# Patient Record
Sex: Female | Born: 1985 | Hispanic: Yes | Marital: Married | Smoking: Never smoker
Health system: Southern US, Community
[De-identification: ages and names within clinical notes are randomized; demographics above are authoritative.]

## PROBLEM LIST (undated history)

## (undated) ENCOUNTER — Inpatient Hospital Stay (HOSPITAL_COMMUNITY): Payer: Self-pay

## (undated) ENCOUNTER — Inpatient Hospital Stay (HOSPITAL_COMMUNITY): Admission: RE | Payer: Self-pay | Source: Ambulatory Visit

## (undated) DIAGNOSIS — E119 Type 2 diabetes mellitus without complications: Secondary | ICD-10-CM

## (undated) DIAGNOSIS — F502 Bulimia nervosa, unspecified: Secondary | ICD-10-CM

## (undated) DIAGNOSIS — K219 Gastro-esophageal reflux disease without esophagitis: Secondary | ICD-10-CM

## (undated) DIAGNOSIS — L309 Dermatitis, unspecified: Secondary | ICD-10-CM

## (undated) DIAGNOSIS — N200 Calculus of kidney: Secondary | ICD-10-CM

## (undated) DIAGNOSIS — F509 Eating disorder, unspecified: Secondary | ICD-10-CM

## (undated) DIAGNOSIS — E0590 Thyrotoxicosis, unspecified without thyrotoxic crisis or storm: Secondary | ICD-10-CM

## (undated) HISTORY — PX: NO PAST SURGERIES: SHX2092

## (undated) HISTORY — DX: Eating disorder, unspecified: F50.9

---

## 2006-02-02 ENCOUNTER — Inpatient Hospital Stay (HOSPITAL_COMMUNITY): Admission: AD | Admit: 2006-02-02 | Discharge: 2006-02-02 | Payer: Self-pay | Admitting: Obstetrics and Gynecology

## 2006-06-23 ENCOUNTER — Inpatient Hospital Stay (HOSPITAL_COMMUNITY): Admission: AD | Admit: 2006-06-23 | Discharge: 2006-06-23 | Payer: Self-pay | Admitting: Obstetrics

## 2006-06-23 ENCOUNTER — Inpatient Hospital Stay (HOSPITAL_COMMUNITY): Admission: AD | Admit: 2006-06-23 | Discharge: 2006-06-26 | Payer: Self-pay | Admitting: Obstetrics

## 2008-08-20 ENCOUNTER — Emergency Department (HOSPITAL_COMMUNITY): Admission: EM | Admit: 2008-08-20 | Discharge: 2008-08-21 | Payer: Self-pay | Admitting: Emergency Medicine

## 2009-03-21 ENCOUNTER — Emergency Department (HOSPITAL_COMMUNITY): Admission: EM | Admit: 2009-03-21 | Discharge: 2009-03-21 | Payer: Self-pay | Admitting: Emergency Medicine

## 2009-05-18 ENCOUNTER — Inpatient Hospital Stay (HOSPITAL_COMMUNITY): Admission: AD | Admit: 2009-05-18 | Discharge: 2009-05-18 | Payer: Self-pay | Admitting: Obstetrics & Gynecology

## 2009-07-25 ENCOUNTER — Inpatient Hospital Stay (HOSPITAL_COMMUNITY): Admission: AD | Admit: 2009-07-25 | Discharge: 2009-07-25 | Payer: Self-pay | Admitting: Family Medicine

## 2009-07-25 ENCOUNTER — Encounter: Payer: Self-pay | Admitting: Family Medicine

## 2009-10-27 ENCOUNTER — Ambulatory Visit (HOSPITAL_COMMUNITY): Admission: RE | Admit: 2009-10-27 | Discharge: 2009-10-27 | Payer: Self-pay | Admitting: Obstetrics & Gynecology

## 2010-03-15 ENCOUNTER — Inpatient Hospital Stay (HOSPITAL_COMMUNITY): Admission: AD | Admit: 2010-03-15 | Discharge: 2010-03-17 | Payer: Self-pay | Admitting: Obstetrics & Gynecology

## 2010-03-15 ENCOUNTER — Ambulatory Visit: Payer: Self-pay | Admitting: Physician Assistant

## 2010-12-20 LAB — CBC
HCT: 36.2 % (ref 36.0–46.0)
HCT: 40.3 % (ref 36.0–46.0)
Hemoglobin: 12.4 g/dL (ref 12.0–15.0)
MCHC: 34.3 g/dL (ref 30.0–36.0)
MCV: 91.6 fL (ref 78.0–100.0)
MCV: 92.3 fL (ref 78.0–100.0)
Platelets: 322 10*3/uL (ref 150–400)
RBC: 3.92 MIL/uL (ref 3.87–5.11)
RDW: 14.6 % (ref 11.5–15.5)
WBC: 13.7 10*3/uL — ABNORMAL HIGH (ref 4.0–10.5)

## 2010-12-20 LAB — RPR: RPR Ser Ql: NONREACTIVE

## 2011-01-06 LAB — CBC
Hemoglobin: 13.4 g/dL (ref 12.0–15.0)
RBC: 4.3 MIL/uL (ref 3.87–5.11)
WBC: 9.1 10*3/uL (ref 4.0–10.5)

## 2011-01-06 LAB — HCG, QUANTITATIVE, PREGNANCY: hCG, Beta Chain, Quant, S: 5358 m[IU]/mL — ABNORMAL HIGH (ref <5)

## 2011-01-06 LAB — URINALYSIS, ROUTINE W REFLEX MICROSCOPIC
Protein, ur: NEGATIVE mg/dL
Urobilinogen, UA: 0.2 mg/dL (ref 0.0–1.0)

## 2011-01-06 LAB — WET PREP, GENITAL: Yeast Wet Prep HPF POC: NONE SEEN

## 2011-01-06 LAB — ABO/RH: ABO/RH(D): O POS

## 2011-01-08 LAB — GC/CHLAMYDIA PROBE AMP, GENITAL
Chlamydia, DNA Probe: NEGATIVE
GC Probe Amp, Genital: NEGATIVE

## 2011-01-08 LAB — URINALYSIS, ROUTINE W REFLEX MICROSCOPIC
Bilirubin Urine: NEGATIVE
Glucose, UA: NEGATIVE mg/dL
Hgb urine dipstick: NEGATIVE
Protein, ur: NEGATIVE mg/dL
Urobilinogen, UA: 0.2 mg/dL (ref 0.0–1.0)

## 2011-01-08 LAB — WET PREP, GENITAL: Yeast Wet Prep HPF POC: NONE SEEN

## 2011-01-10 LAB — URINALYSIS, ROUTINE W REFLEX MICROSCOPIC
Ketones, ur: NEGATIVE mg/dL
Nitrite: NEGATIVE
pH: 6.5 (ref 5.0–8.0)

## 2011-01-10 LAB — URINE MICROSCOPIC-ADD ON

## 2011-02-11 ENCOUNTER — Emergency Department (HOSPITAL_COMMUNITY)
Admission: EM | Admit: 2011-02-11 | Discharge: 2011-02-11 | Disposition: A | Discharge status: Home / Self Care | Payer: Self-pay | Attending: Emergency Medicine | Admitting: Emergency Medicine

## 2011-02-11 ENCOUNTER — Emergency Department (HOSPITAL_COMMUNITY): Payer: Self-pay

## 2011-02-11 DIAGNOSIS — M549 Dorsalgia, unspecified: Secondary | ICD-10-CM | POA: Insufficient documentation

## 2011-02-11 DIAGNOSIS — R070 Pain in throat: Secondary | ICD-10-CM | POA: Insufficient documentation

## 2011-02-11 DIAGNOSIS — R079 Chest pain, unspecified: Secondary | ICD-10-CM | POA: Insufficient documentation

## 2011-02-11 DIAGNOSIS — R109 Unspecified abdominal pain: Secondary | ICD-10-CM | POA: Insufficient documentation

## 2011-02-11 DIAGNOSIS — R1110 Vomiting, unspecified: Secondary | ICD-10-CM | POA: Insufficient documentation

## 2011-02-11 LAB — POCT I-STAT, CHEM 8
BUN: 4 mg/dL — ABNORMAL LOW (ref 6–23)
Glucose, Bld: 83 mg/dL (ref 70–99)
HCT: 41 % (ref 36.0–46.0)
Potassium: 3.4 mEq/L — ABNORMAL LOW (ref 3.5–5.1)
Sodium: 138 mEq/L (ref 135–145)

## 2011-02-18 NOTE — Op Note (Signed)
[name], Kendra NO.:  [id number]   MEDICAL RECORD NO.:  [id number]          PATIENT TYPE:  INP   LOCATION:  9147                          FACILITY:  [facility]   PHYSICIAN:  Kathreen Cosier, M.D.DATE OF BIRTH:  Feb 24, 1986   DATE OF PROCEDURE:  06/24/2006  DATE OF DISCHARGE:  06/26/2006                                 OPERATIVE REPORT   PROCEDURE:  Delivery note.   DESCRIPTION OF PROCEDURE:  The patient was fully dilated and pushed to +3  station.  Then had a midline episiotomy done and she pushed through 1  contraction and delivered from the LOA position of a female, Apgars 9 and 9.  She had a third degree extension which was repaired with 0 Vicryl.  The  episiotomy was repaired with 3-0 Monocryl.  __________.           ______________________________  Kathreen Cosier, M.D.     BAM/MEDQ  D:  06/24/2006  T:  06/27/2006  Job:  [id number]

## 2011-07-04 ENCOUNTER — Emergency Department (HOSPITAL_COMMUNITY)
Admission: EM | Admit: 2011-07-04 | Discharge: 2011-07-04 | Discharge status: Against Medical Advice | Payer: Self-pay | Attending: Emergency Medicine | Admitting: Emergency Medicine

## 2011-07-04 DIAGNOSIS — E876 Hypokalemia: Secondary | ICD-10-CM | POA: Insufficient documentation

## 2011-07-04 DIAGNOSIS — R1110 Vomiting, unspecified: Secondary | ICD-10-CM | POA: Insufficient documentation

## 2011-07-04 DIAGNOSIS — F5000 Anorexia nervosa, unspecified: Secondary | ICD-10-CM | POA: Insufficient documentation

## 2011-07-04 DIAGNOSIS — R632 Polyphagia: Secondary | ICD-10-CM | POA: Insufficient documentation

## 2011-07-04 LAB — BASIC METABOLIC PANEL
Chloride: 93 mEq/L — ABNORMAL LOW (ref 96–112)
GFR calc Af Amer: >90 mL/min (ref >90)
GFR calc non Af Amer: >90 mL/min (ref >90)
Potassium: 2.7 mEq/L — CL (ref 3.5–5.1)
Sodium: 136 mEq/L (ref 135–145)

## 2011-07-04 LAB — COMPREHENSIVE METABOLIC PANEL
ALT: 31 U/L (ref 0–35)
AST: 33 U/L (ref 0–37)
Albumin: 4.2 g/dL (ref 3.5–5.2)
CO2: 34 mEq/L — ABNORMAL HIGH (ref 19–32)
Calcium: 9.3 mg/dL (ref 8.4–10.5)
GFR calc non Af Amer: >90 mL/min (ref >90)
Sodium: 130 mEq/L — ABNORMAL LOW (ref 135–145)

## 2011-07-04 LAB — CBC
HCT: 38.5 % (ref 36.0–46.0)
MCH: 29.8 pg (ref 26.0–34.0)
MCV: 83.2 fL (ref 78.0–100.0)
Platelets: 326 10*3/uL (ref 150–400)

## 2011-07-04 LAB — POCT I-STAT, CHEM 8
HCT: 42 % (ref 36.0–46.0)
Hemoglobin: 14.3 g/dL (ref 12.0–15.0)
Potassium: 2.4 mEq/L — CL (ref 3.5–5.1)
Sodium: 133 mEq/L — ABNORMAL LOW (ref 135–145)

## 2011-07-04 LAB — DIFFERENTIAL
Basophils Absolute: 0 10*3/uL (ref 0.0–0.1)
Basophils Relative: 0 % (ref 0–1)
Lymphocytes Relative: 38 % (ref 12–46)
Monocytes Absolute: 0.4 10*3/uL (ref 0.1–1.0)
Neutro Abs: 3.9 10*3/uL (ref 1.7–7.7)

## 2011-07-04 LAB — URINALYSIS, ROUTINE W REFLEX MICROSCOPIC
Glucose, UA: NEGATIVE mg/dL
Leukocytes, UA: NEGATIVE
Specific Gravity, Urine: 1.005 (ref 1.005–1.030)
pH: 7 (ref 5.0–8.0)

## 2011-07-04 LAB — URINE MICROSCOPIC-ADD ON

## 2011-07-06 LAB — GC/CHLAMYDIA PROBE AMP, GENITAL
Chlamydia, DNA Probe: NEGATIVE
GC Probe Amp, Genital: NEGATIVE

## 2011-07-06 LAB — URINALYSIS, ROUTINE W REFLEX MICROSCOPIC
Nitrite: NEGATIVE
Specific Gravity, Urine: 1.023
Urobilinogen, UA: 1
pH: 6.5

## 2011-07-06 LAB — RPR: RPR Ser Ql: NONREACTIVE

## 2011-07-06 LAB — URINE MICROSCOPIC-ADD ON

## 2011-07-06 LAB — WET PREP, GENITAL: Clue Cells Wet Prep HPF POC: NONE SEEN

## 2011-07-26 ENCOUNTER — Emergency Department (HOSPITAL_COMMUNITY)
Admission: EM | Admit: 2011-07-26 | Discharge: 2011-07-27 | Disposition: A | Discharge status: Home / Self Care | Payer: Self-pay | Attending: Emergency Medicine | Admitting: Emergency Medicine

## 2011-07-26 DIAGNOSIS — R110 Nausea: Secondary | ICD-10-CM | POA: Insufficient documentation

## 2011-07-26 DIAGNOSIS — F5000 Anorexia nervosa, unspecified: Secondary | ICD-10-CM | POA: Insufficient documentation

## 2011-07-26 DIAGNOSIS — E876 Hypokalemia: Secondary | ICD-10-CM | POA: Insufficient documentation

## 2011-07-27 LAB — DIFFERENTIAL
Basophils Absolute: 0 10*3/uL (ref 0.0–0.1)
Eosinophils Relative: 1 % (ref 0–5)
Lymphs Abs: 4.3 10*3/uL — ABNORMAL HIGH (ref 0.7–4.0)
Monocytes Absolute: 0.5 10*3/uL (ref 0.1–1.0)
Neutrophils Relative %: 36 % — ABNORMAL LOW (ref 43–77)

## 2011-07-27 LAB — POCT I-STAT, CHEM 8
Calcium, Ion: 1.14 mmol/L (ref 1.12–1.32)
Calcium, Ion: 1.15 mmol/L (ref 1.12–1.32)
Chloride: 92 mEq/L — ABNORMAL LOW (ref 96–112)
Creatinine, Ser: 0.8 mg/dL (ref 0.50–1.10)
Glucose, Bld: 80 mg/dL (ref 70–99)
Glucose, Bld: 93 mg/dL (ref 70–99)
HCT: 47 % — ABNORMAL HIGH (ref 36.0–46.0)
Hemoglobin: 13.3 g/dL (ref 12.0–15.0)
Hemoglobin: 16 g/dL — ABNORMAL HIGH (ref 12.0–15.0)
Potassium: 2.8 mEq/L — ABNORMAL LOW (ref 3.5–5.1)
Potassium: 3.4 mEq/L — ABNORMAL LOW (ref 3.5–5.1)

## 2011-07-27 LAB — URINALYSIS, ROUTINE W REFLEX MICROSCOPIC
Nitrite: NEGATIVE
Protein, ur: NEGATIVE mg/dL
Specific Gravity, Urine: 1.003 — ABNORMAL LOW (ref 1.005–1.030)
Urobilinogen, UA: 0.2 mg/dL (ref 0.0–1.0)

## 2011-07-27 LAB — URINE MICROSCOPIC-ADD ON

## 2011-07-27 LAB — HEPATIC FUNCTION PANEL
AST: 23 U/L (ref 0–37)
Albumin: 5.2 g/dL (ref 3.5–5.2)
Total Protein: 8.5 g/dL — ABNORMAL HIGH (ref 6.0–8.3)

## 2011-07-27 LAB — MAGNESIUM: Magnesium: 2.3 mg/dL (ref 1.5–2.5)

## 2011-07-27 LAB — CBC
HCT: 41.2 % (ref 36.0–46.0)
MCHC: 37.6 g/dL — ABNORMAL HIGH (ref 30.0–36.0)
Platelets: 405 10*3/uL — ABNORMAL HIGH (ref 150–400)
RDW: 12.3 % (ref 11.5–15.5)
WBC: 7.6 10*3/uL (ref 4.0–10.5)

## 2011-07-27 LAB — POCT PREGNANCY, URINE: Preg Test, Ur: NEGATIVE

## 2011-07-27 LAB — LIPASE, BLOOD: Lipase: 65 U/L — ABNORMAL HIGH (ref 11–59)

## 2011-10-10 ENCOUNTER — Encounter: Payer: Self-pay | Admitting: Emergency Medicine

## 2011-10-10 ENCOUNTER — Emergency Department (HOSPITAL_COMMUNITY)
Admission: EM | Admit: 2011-10-10 | Discharge: 2011-10-10 | Disposition: A | Discharge status: Home / Self Care | Payer: Self-pay | Attending: Emergency Medicine | Admitting: Emergency Medicine

## 2011-10-10 DIAGNOSIS — J029 Acute pharyngitis, unspecified: Secondary | ICD-10-CM

## 2011-10-10 DIAGNOSIS — R110 Nausea: Secondary | ICD-10-CM | POA: Insufficient documentation

## 2011-10-10 LAB — RAPID STREP SCREEN (MED CTR MEBANE ONLY): Streptococcus, Group A Screen (Direct): NEGATIVE

## 2011-10-10 MED ORDER — PHENOL 1.4 % MT LIQD: 2.0000 | OROMUCOSAL | Status: DC | PRN

## 2011-10-10 NOTE — ED Notes (Signed)
sore throat x 3 days

## 2011-10-10 NOTE — ED Provider Notes (Signed)
History     CSN: [id number]  Arrival date & time 10/10/11  1112   First MD Initiated Contact with Patient 10/10/11 1238      Chief Complaint  Patient presents with  . Sore Throat    (Consider location/radiation/quality/duration/timing/severity/associated sxs/prior treatment) HPI Comments: Slight nausea, no cough, no abd pain.  No ear pain.  No fevers or chills.  Patient is a 26 y.o. female presenting with pharyngitis. The history is provided by the patient. The history is limited by a language barrier.  Sore Throat The current episode started more than 2 days ago. The problem occurs constantly. The problem has not changed since onset.Pertinent negatives include no chest pain, no abdominal pain and no shortness of breath.    History reviewed. No pertinent past medical history.  History reviewed. No pertinent past surgical history.  History reviewed. No pertinent family history.  History  Substance Use Topics  . Smoking status: Not on file  . Smokeless tobacco: Not on file  . Alcohol Use: Not on file    OB History    Grav Para Term Preterm Abortions TAB SAB Ect Mult Living                  Review of Systems  Constitutional: Negative for fever.  HENT: Positive for sore throat. Negative for sinus pressure.   Respiratory: Negative for cough and shortness of breath.   Cardiovascular: Negative for chest pain.  Gastrointestinal: Positive for nausea. Negative for vomiting and abdominal pain.    Allergies  Review of patient's allergies indicates no known allergies.  Home Medications   Current Outpatient Rx  Name Route Sig Dispense Refill  . PHENOL 1.4 % MT LIQD Mouth/Throat Use as directed 2 sprays in the mouth or throat as needed. 1 Bottle 0    BP 90/58  Pulse 74  Temp 97.5 F (36.4 C)  Resp 18  SpO2 97%  Physical Exam  Nursing note and vitals reviewed. Constitutional: She appears well-developed and well-nourished. No distress.  HENT:  Head: Normocephalic  and atraumatic.  Mouth/Throat: Uvula is midline, oropharynx is clear and moist and mucous membranes are normal. No oral lesions. No uvula swelling. No oropharyngeal exudate or tonsillar abscesses.  Pulmonary/Chest: Effort normal and breath sounds normal.  Abdominal: Soft. There is no rebound and no guarding.  Lymphadenopathy:    She has no cervical adenopathy.    ED Course  Procedures (including critical care time)   Labs Reviewed  RAPID STREP SCREEN  STREP A DNA PROBE   No results found.   1. Pharyngitis       MDM  Pt has no fever, RA sat of 97% is normal.  No sig lymphadenopathy.  Lungs clear, abd is soft, non tender.  Will check a strep screen.  I suspect just viral pharyngitis.          Gavin Pound. Jeanelle Dake, MD 10/10/11 1416

## 2011-10-10 NOTE — ED Notes (Signed)
Pt c/o sorethroat with burning x several days; pt speaks little english

## 2011-10-10 NOTE — Discharge Instructions (Signed)
Dolor de garganta  (Sore Throat)  El dolor de garganta puede ser causado tanto por virus como por bacterias. Otras causas posibles son:   El consumo de cigarrillos.    La polucin    Alergias.   Si el dolor de garganta se debe a una infeccin por estreptococo (infeccin bacteriana), podr necesitar:   Lauris Poag farngea.    Un cultivo para verificar la infeccin por estreptococo.   Necesitar:   Una inyeccin de antibiticos.    Medicamentos orales por [employer].   Las infecciones por estreptococos son Nicholaus Corolla. Un mdico deber controlar a las personas ms cercanas a usted que Teaching laboratory technician de garganta o [place]. El dolor de garganta causado por una infeccin viral generalmente dura entre 3 a 4 das. Los antibiticos no son efectivos para tratar dolores de garganta virales.   Sin embargo, la mononucleosis (o una enfermedad viral) puede causar dolor de garganta que dura hasta 3 semanas. La mononucleosis puede diagnosticarse mediante exmenes de [place]. Tiene que haber estado enfermo durante al menos 1 semana para [employer] del examen puedan dar un resultado seguro.  INSTRUCCIONES PARA EL CUIDADO DOMICILIARIO   Para tratar el dolor de garganta puede tomar un analgsico suave com.    Aumente su ingesta de lquidos.    Realice una dieta blanda.    No fume.    Hgase grgaras con agua tibia o agua con sal (una cucharada de sal en 25 cc de agua).    Pruebe con [employer] y pastillas para la tos, o chupe un caramelo duro para ayudar a Merchant navy officer.   Comunquese con su mdico si su dolor de garganta dura ms de una semana.   SOLICITE ATENCIN MDICA DE INMEDIATO SI:    Tiene dificultad para respirar.    Tiene un aumento de la inflamacin en la garganta.    Tiene dolor muy intenso que impide tragar lquidos o la propia saliva.    Tiene dolor de cabeza intenso, fiebre alta, vmitos o una erupcin.   Document Released: 09/19/2005 Document Revised: 06/01/2011  [facility] Patient  Information 2012 [place], [place].

## 2011-10-11 LAB — STREP A DNA PROBE: Group A Strep Probe: NEGATIVE

## 2011-11-02 ENCOUNTER — Emergency Department (HOSPITAL_COMMUNITY): Payer: Self-pay

## 2011-11-02 ENCOUNTER — Encounter (HOSPITAL_COMMUNITY): Payer: Self-pay | Admitting: Emergency Medicine

## 2011-11-02 ENCOUNTER — Emergency Department (HOSPITAL_COMMUNITY)
Admission: EM | Admit: 2011-11-02 | Discharge: 2011-11-02 | Disposition: A | Discharge status: Home / Self Care | Payer: Self-pay | Attending: Emergency Medicine | Admitting: Emergency Medicine

## 2011-11-02 ENCOUNTER — Other Ambulatory Visit: Payer: Self-pay

## 2011-11-02 DIAGNOSIS — M549 Dorsalgia, unspecified: Secondary | ICD-10-CM | POA: Insufficient documentation

## 2011-11-02 DIAGNOSIS — R072 Precordial pain: Secondary | ICD-10-CM | POA: Insufficient documentation

## 2011-11-02 DIAGNOSIS — E876 Hypokalemia: Secondary | ICD-10-CM | POA: Insufficient documentation

## 2011-11-02 DIAGNOSIS — F502 Bulimia nervosa, unspecified: Secondary | ICD-10-CM | POA: Insufficient documentation

## 2011-11-02 DIAGNOSIS — E860 Dehydration: Secondary | ICD-10-CM | POA: Insufficient documentation

## 2011-11-02 DIAGNOSIS — R42 Dizziness and giddiness: Secondary | ICD-10-CM | POA: Insufficient documentation

## 2011-11-02 DIAGNOSIS — R0602 Shortness of breath: Secondary | ICD-10-CM | POA: Insufficient documentation

## 2011-11-02 LAB — POCT I-STAT, CHEM 8
Creatinine, Ser: 0.9 mg/dL (ref 0.50–1.10)
Hemoglobin: 13.6 g/dL (ref 12.0–15.0)
Sodium: 133 mEq/L — ABNORMAL LOW (ref 135–145)
TCO2: 40 mmol/L (ref 0–100)

## 2011-11-02 LAB — POCT I-STAT TROPONIN I: Troponin i, poc: 0 ng/mL (ref 0.00–0.08)

## 2011-11-02 MED ORDER — POTASSIUM CHLORIDE 10 MEQ/100ML IV SOLN
10.0000 meq | Freq: Once | INTRAVENOUS | Status: AC
  Administered 2011-11-02: 10 meq via INTRAVENOUS
  Filled 2011-11-02: qty 100

## 2011-11-02 MED ORDER — POTASSIUM CHLORIDE CRYS ER 20 MEQ PO TBCR
20.0000 meq | EXTENDED_RELEASE_TABLET | Freq: Once | ORAL | Status: AC
  Administered 2011-11-02: 20 meq via ORAL
  Filled 2011-11-02: qty 1

## 2011-11-02 MED ORDER — GI COCKTAIL ~~LOC~~
30.0000 mL | Freq: Once | ORAL | Status: AC
  Administered 2011-11-02: 30 mL via ORAL
  Filled 2011-11-02: qty 30

## 2011-11-02 MED ORDER — OMEPRAZOLE 20 MG PO CPDR: 20.0000 mg | DELAYED_RELEASE_CAPSULE | Freq: Every day | ORAL | Status: DC

## 2011-11-02 MED ORDER — PANTOPRAZOLE SODIUM 40 MG IV SOLR
40.0000 mg | Freq: Once | INTRAVENOUS | Status: AC
  Administered 2011-11-02: 40 mg via INTRAVENOUS
  Filled 2011-11-02: qty 40

## 2011-11-02 MED ORDER — POTASSIUM CHLORIDE ER 10 MEQ PO TBCR: 10.0000 meq | EXTENDED_RELEASE_TABLET | Freq: Two times a day (BID) | ORAL | Status: DC

## 2011-11-02 MED ORDER — SODIUM CHLORIDE 0.9 % IV BOLUS (SEPSIS)
1000.0000 mL | Freq: Once | INTRAVENOUS | Status: AC
  Administered 2011-11-02: 1000 mL via INTRAVENOUS

## 2011-11-02 NOTE — ED Notes (Signed)
Pt reports on arrival sx of: sob,cp,dizzy,abd pain &vomiting.(Denies: diarrhea, fever, cough  or sore throat).

## 2011-11-02 NOTE — ED Notes (Signed)
Chem 8 results given to Dr. Patria Mane by B. Bing Plume, EMT

## 2011-11-02 NOTE — ED Notes (Signed)
Pt told via interpreter importance of PCP and following up with dietian. Pt verbalized understanding

## 2011-11-02 NOTE — ED Provider Notes (Signed)
History     CSN: [id number]  Arrival date & time 11/02/11  0865   First MD Initiated Contact with Patient 11/02/11 [phone]      Chief Complaint  Patient presents with  . Shortness of Breath  . Chest Pain  . Dizziness  . Emesis    The history is provided by the patient. A language interpreter was used Garment/textile technologist phone).   patient reports a history of approximately one year of bulimia.  She reports she vomits after most meals.  She reports several days of worsening chest pain and lightheadedness.  She reports the chest pain radiates through to her back.  She denies history of EtOH abuse.  She also reports feeling "dizzy".  She denies hematemesis.  She denies melena or hematochezia.  She denies loose stools.  She denies abdominal pain nausea vomiting or diarrhea.  When asked about her chest pain she points towards her lower sternum and epigastric region.  Nothing worsens her symptoms.  Nothing improves her symptoms.  Symptoms are constant.  She's had no fever or chills.  She has however with her physician about this.  She has not seen anyone in several months regarding her bulimia.  History reviewed. No pertinent past medical history.  History reviewed. No pertinent past surgical history.  No family history on file.  History  Substance Use Topics  . Smoking status: Never Smoker   . Smokeless tobacco: Not on file  . Alcohol Use: No    OB History    Grav Para Term Preterm Abortions TAB SAB Ect Mult Living                  Review of Systems  Respiratory: Positive for shortness of breath.   Cardiovascular: Positive for chest pain.  Gastrointestinal: Positive for vomiting.  All other systems reviewed and are negative.    Allergies  Review of patient's allergies indicates no known allergies.  Home Medications  No current outpatient prescriptions on file.  BP 94/59  Pulse 72  Temp(Src) 98.5 F (36.9 C) (Oral)  Resp 20  SpO2 98%  Physical Exam  Nursing note and vitals  reviewed. Constitutional: She is oriented to person, place, and time. She appears well-developed and well-nourished. No distress.  HENT:  Head: Normocephalic and atraumatic.  Eyes: EOM are normal.  Neck: Normal range of motion.  Cardiovascular: Normal rate, regular rhythm and normal heart sounds.   Pulmonary/Chest: Effort normal and breath sounds normal.  Abdominal: Soft. She exhibits no distension. There is no tenderness.  Musculoskeletal: Normal range of motion.  Neurological: She is alert and oriented to person, place, and time.  Skin: Skin is warm and dry.  Psychiatric: She has a normal mood and affect. Judgment normal.    ED Course  Procedures (including critical care time)   Date: 11/02/2011  Rate: 84  Rhythm: normal sinus rhythm  QRS Axis: normal  Intervals: normal  ST/T Wave abnormalities: Nonspecific ST and T wave changes.    Conduction Disutrbances:none  Narrative Interpretation:   Old EKG Reviewed: No old EKG for comparison     Labs Reviewed  POCT I-STAT, CHEM 8 - Abnormal; Notable for the following:    Sodium 133 (*)    Potassium 2.3 (*)    Chloride 84 (*)    Glucose, Bld 122 (*)    Calcium, Ion 1.09 (*)    All other components within normal limits  POCT I-STAT TROPONIN I   Dg Chest 2 View  11/02/2011  *RADIOLOGY  REPORT*  Clinical Data: Chest pain, back pain, shortness of breath.  CHEST - 2 VIEW  Comparison: None.  Findings: Mild hyperinflation.  Normal heart size and pulmonary vascularity.  Lungs appear clear expanded.  No focal airspace consolidation.  No blunting of costophrenic angles.  No pneumothorax.  Vague nodular opacity in the left midlung probably represents prominent nipple shadow.  IMPRESSION: No evidence of active pulmonary disease.  Original Report Authenticated By: Marlon Pel, M.D.     No diagnosis found.    MDM  The patient is very thin.  She appears to have dehydration hypokalemia associated with her bulimia.  I suspect her  discomfort is gastritis versus esophagitis.  Her potassium is low in the ER.  She will be repleted potassium.  IV Protonix and a GI cocktail be given in attempt to make the patient feel better  7:52 AM The patient feels much better at this time.  She's been given her potassium.   She will  be discharged home with potassium.  She's been instructed to followup with her primary care doctor and receive assistance from a dietitian.       Lyanne Co, MD 11/02/11 [phone]

## 2011-11-02 NOTE — ED Notes (Signed)
Pt. In room sitting quietly, c/o generalized body aches , states pain in left flank radiating down left leg, vomiting x 3 days.

## 2011-11-02 NOTE — ED Notes (Signed)
Report given and care relinquished to Sarah, RN.

## 2011-11-02 NOTE — ED Notes (Signed)
PT. REPORTS MID STERNAL CHEST PAIN WITH SOB AND VOMITTING ONSET 3 DAYS AGO .

## 2012-03-19 ENCOUNTER — Ambulatory Visit (INDEPENDENT_AMBULATORY_CARE_PROVIDER_SITE_OTHER): Payer: Self-pay | Admitting: Family Medicine

## 2012-03-19 ENCOUNTER — Encounter: Payer: Self-pay | Admitting: Family Medicine

## 2012-03-19 VITALS — BP 80/62 | HR 80 | Temp 98.7°F | Ht 63.0 in | Wt 91.4 lb

## 2012-03-19 DIAGNOSIS — R636 Underweight: Secondary | ICD-10-CM

## 2012-03-19 DIAGNOSIS — N911 Secondary amenorrhea: Secondary | ICD-10-CM

## 2012-03-19 DIAGNOSIS — N912 Amenorrhea, unspecified: Secondary | ICD-10-CM

## 2012-03-19 DIAGNOSIS — F509 Eating disorder, unspecified: Secondary | ICD-10-CM

## 2012-03-19 NOTE — Patient Instructions (Addendum)
Nice to meet you. I am sorry you are affected by bulemia. Your weight and BMI are very low. We should check blood work after you get the orange card. You should try to find a therapist or return to the one you saw. You can try at [facility] of the [place]. Make an appointment in 1-2 weeks for follow up.  Trastornos De La Alimentacin (Eating Disorders) Los trastornos alimentarios son problemas mdicos y psicolgicos. Generalmente tienen causas psicolgicas o emocionales. Son Henry Schein en personas con desrdenes alimentarios la depresin, la obsesin con la comida y la distorsin de Production assistant, radio. Con el tiempo, los desrdenes alimentarios pueden daar su cuerpo. Los desrdenes alimentarios ms frecuentes son:  Bulimia.  Reece Agar cuando una persona come gran cantidad de alimentos y no puede detenerse (atracn). Generalmente es seguido por vmitos, actividad fsica excesiva o uso de laxantes (purga). Se realizan con el objetivo de deshacerse de las caloras consumidas. La bulimia puede comenzar como un modo de Art gallery manager. Ms tarde la comilona y la purga pueden estar causadas por estrs o crisis emocional.   Anorexia nerviosa. Esto ocurre cuando una persona tiene un peso corporal extremadamente bajo por haber seguido una dieta muy estricta, actividad fsica compulsiva, o ambas. El perder peso o el evitar la ganancia de peso se vuelve una obsesin. Generalmente es un modo de evadir problemas emocionales.   Trastornos de Psychologist, sport and exercise no especificados (TANE). Este diagnstico se [employer] que tienen algunos de los sntomas de bulimia nerviosa o anorexia nerviosa. Sin embargo, no tienen todos los criterios para diagnosticar un trastorno especfico.  La bulimia y la anorexia pueden conducir a problemas graves que son:  Desnutricin extrema.   Desequilibrio hormonal.   Dficit de vitaminas y minerales.   Dao [employer].  DIAGNSTICO Metallurgist un examen fsico completo. Podr ser Lois Huxley evaluacin psicolgica. La evaluacin psicolgica incluir preguntas sobre los hbitos de alimentacin y la autoimagen. Podrn realizarle anlisis de sangre y [place]. TRATAMIENTO El tratamiento incluye:  Orientacin.   Nutricin adecuados.   Ejercicio adecuados.   A veces medicacin.   En los casos extremos ser necesaria la hospitalizacin.  INSTRUCCIONES PARA EL CUIDADO DOMICILIARIO  Conozca su trastorno alimenticio.   Identifique las situaciones que lo llevan a la sobrealimentacin o a Psychologist, forensic.   Desarrolle un plan para cuando quiera sobrealimentarse o hacer dieta.   Sepa quin puede apoyarlo cuando necesite hablar.   Resstase a pesarse o mirarse al espejo con frecuencia.   Siga las indicaciones del profesional sobre la alimentacin y el planeamiento de ejercicios.   Es muy importante que cumpla con las visitas para Animator.   Hgase controles dentales cada 6 meses.  SOLICITE ATENCIN MDICA SI:  Gana o pierde peso rpidamente.   Se provoca vmitos despus de comer.   Abusa de estimulantes o dietas.   Tiene hbitos compulsivos de hacer ejercicio.   Tiene pulso irregular.   Tiene miedo constante a Transport planner.   Se purga con laxantes despus de comer.   Tiene hbitos compulsivos para comer o Merchandiser, retail.   Tiene perodos menstruales irregulares.   No tiene el perodo menstrual.  SOLICITE ATENCIN MDICA DE INMEDIATO SI:  Vomita sangre o algo similar a la borra del caf.   Elimina heces con sangre brillante o de color rojo alquitranado.   Siente dolor en el pecho o presin.   Presenta dificultades respiratorias.   No orina por 8 horas.  PARA  OBTENER MS INFORMACIN  Alliance for Eating Disorders Awareness (Alianza para la conciencia contra los trastornos alimentarios) : www.allianceforeatingdisorders.com  National Association of Anorexia Nervosa and Associated Disorders (Asociacin  nacional contra la anorexia nerviosa y trastornos asociados) : www.anad.org  National Eating Disorders Association (Asociacin nacional contra los trastornos alimentarios): www.nationaleatingdisorders.org  Eating Disorders Anonymous (Trasrornos alimentarios annimos): www.eatingdisordersanonymous.org  Eating Disorders Resource Center [facility] de recursos para los trastornos alimentarios): AmericasFunny.ch  Document Released: 09/19/2005 Document Revised: 09/08/2011 [facility] Patient Information 2012 [place], [place].

## 2012-03-20 ENCOUNTER — Encounter: Payer: Self-pay | Admitting: Family Medicine

## 2012-03-20 DIAGNOSIS — R636 Underweight: Secondary | ICD-10-CM | POA: Insufficient documentation

## 2012-03-20 DIAGNOSIS — F502 Bulimia nervosa: Secondary | ICD-10-CM | POA: Insufficient documentation

## 2012-03-20 DIAGNOSIS — N911 Secondary amenorrhea: Secondary | ICD-10-CM | POA: Insufficient documentation

## 2012-03-20 NOTE — Assessment & Plan Note (Signed)
Body mass index is 16.19 kg/(m^2). 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  Discussed with patient principles of treatment including therapy. Would like to check baseline labs, electrolytes as patient has been hypokalemic before. Patient prefers to get orange card prior to studies. Plan to refer to Dr. Gerilyn Pilgrim nutrition and encouraged patient to f/u with the therapist she already saw recently. If not, will discuss other options for uninsured-perhaps Family Services or [facility]. Follow up in 1 week.

## 2012-03-20 NOTE — Assessment & Plan Note (Signed)
Likely secondary to disordered eating and low BMI which patient quickly endorses. Do not suspect underlying endocrine pathology since she had normal fertility and cycles prior to the eating disorder.

## 2012-03-20 NOTE — Progress Notes (Signed)
  Subjective:    Patient ID: [name], female    DOB: 07/25/1986, 26 y.o.   MRN: [id number]  HPI New patient to establish care. Spanish interpretor present. Plans to obtain orange card.   1. Amenorrhea. For past 2 years after birth of her son. No longer breastfeeding. Denies abdominal pain, rash, headaches.  2. Bulemia. She had her second child 2 years ago, and states she became bulimic at that time. She forces herself to throw up nearly daily. States she saw a therapist few weeks ago, found it slightly helpful. Unfortunately she doesn't remember his name and has no plans to return. She is concerned about being told she has low potassium at an ER visit several months ago. Endorses fatigue. Denies syncope, chest pain. States she plans to eat later today and not throw up.  Patient is married and has a 27 yo and a 69 yo child. 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   Past Medical History  Diagnosis Date  . Eating disorder    History reviewed. No pertinent past surgical history. History   Social History  . Marital Status: Married    Spouse Name: N/A    Number of Children: N/A  . Years of Education: N/A   Occupational History  . Not on file.   Social History Main Topics  . Smoking status: Never Smoker   . Smokeless tobacco: Not on file  . Alcohol Use: No  . Drug Use: No  . Sexually Active: Yes   Other Topics Concern  . Not on file   Social History Narrative   Lives in [place] since 2005 with husband and 2 children (2011, 2008). [place]. Spanish speaking.    Family History  Problem Relation Age of Onset  . Heart disease Mother     Review of Systems See HPI otherwise negative.     Objective:   Physical Exam  Vitals reviewed. Constitutional: She is oriented to person, place, and time. No distress.       Very thin. Hypertrophied salivary glands/parotid glands.   HENT:  Head: Atraumatic.  Mouth/Throat: Oropharynx is clear and moist.  Eyes: EOM are  normal. Pupils are equal, round, and reactive to light.  Neck: Neck supple.  Cardiovascular: Normal rate and regular rhythm.  Exam reveals no gallop.   No murmur heard. Pulmonary/Chest: Effort normal and breath sounds normal.  Abdominal: Soft. She exhibits no distension. There is no tenderness.  Musculoskeletal: She exhibits no edema and no tenderness.  Lymphadenopathy:    She has no cervical adenopathy.  Neurological: She is alert and oriented to person, place, and time. No cranial nerve deficit. She exhibits normal muscle tone. Coordination normal.  Skin: No rash noted. She is not diaphoretic.  Psychiatric:       Slight flat affect. Normal speech. Well-groomed.       Assessment & Plan:

## 2012-04-04 ENCOUNTER — Ambulatory Visit: Payer: Self-pay | Admitting: Family Medicine

## 2012-04-16 ENCOUNTER — Ambulatory Visit (HOSPITAL_COMMUNITY)
Admission: RE | Admit: 2012-04-16 | Discharge: 2012-04-16 | Disposition: A | Discharge status: Home / Self Care | Payer: Self-pay | Source: Ambulatory Visit | Attending: Family Medicine | Admitting: Family Medicine

## 2012-04-16 ENCOUNTER — Ambulatory Visit (INDEPENDENT_AMBULATORY_CARE_PROVIDER_SITE_OTHER): Payer: Self-pay | Admitting: Family Medicine

## 2012-04-16 VITALS — BP 87/59 | HR 84 | Ht 63.0 in | Wt 87.0 lb

## 2012-04-16 DIAGNOSIS — R636 Underweight: Secondary | ICD-10-CM

## 2012-04-16 DIAGNOSIS — F509 Eating disorder, unspecified: Secondary | ICD-10-CM

## 2012-04-16 DIAGNOSIS — I498 Other specified cardiac arrhythmias: Secondary | ICD-10-CM | POA: Insufficient documentation

## 2012-04-16 DIAGNOSIS — F329 Major depressive disorder, single episode, unspecified: Secondary | ICD-10-CM

## 2012-04-16 DIAGNOSIS — F32A Depression, unspecified: Secondary | ICD-10-CM

## 2012-04-16 DIAGNOSIS — F3289 Other specified depressive episodes: Secondary | ICD-10-CM

## 2012-04-16 DIAGNOSIS — R632 Polyphagia: Secondary | ICD-10-CM | POA: Insufficient documentation

## 2012-04-16 LAB — POCT URINALYSIS DIPSTICK
Blood, UA: NEGATIVE
Ketones, UA: NEGATIVE
Leukocytes, UA: NEGATIVE
Spec Grav, UA: 1.015
pH, UA: 8

## 2012-04-16 LAB — CBC
HCT: 38.9 % (ref 36.0–46.0)
Hemoglobin: 13.7 g/dL (ref 12.0–15.0)
MCH: 30.8 pg (ref 26.0–34.0)
MCHC: 35.2 g/dL (ref 30.0–36.0)
RDW: 13.6 % (ref 11.5–15.5)

## 2012-04-16 MED ORDER — CITALOPRAM HYDROBROMIDE 10 MG PO TABS: 10.0000 mg | ORAL_TABLET | Freq: Every day | ORAL | Status: DC

## 2012-04-16 MED ORDER — POTASSIUM CHLORIDE ER 10 MEQ PO TBCR: 20.0000 meq | EXTENDED_RELEASE_TABLET | Freq: Two times a day (BID) | ORAL | Status: DC

## 2012-04-16 NOTE — Assessment & Plan Note (Addendum)
Discuss treatment of bulimia with therapy, medication. Patient to followup at family services, will make nutrition and social work referral today, start low dose Celexa 10 mg. Check potassium, electrolytes, TSH, CBC today. EKG with flat T waves most likely related to hypokalemia. Will start low dose replacement daily pending results. Discussed eating small snacks and avoiding purging. Followup in one to 2 weeks. Advised to call if any problems with medication or change in status.  Case discussed with Dr. Mauricio Po, who also evaluated patient.

## 2012-04-16 NOTE — Assessment & Plan Note (Signed)
Likely playing a part with eating disorder. Start low-dose Celexa. Followup in one to 2 weeks. Discussed medication side effects and discontinuation for worsening symptoms

## 2012-04-16 NOTE — Progress Notes (Signed)
  Subjective:    Patient ID: [name], female    DOB: 08/16/1986, 26 y.o.   MRN: [id number]  HPI  Interpretor present during interview.  1. bulimia. Patient continues to struggle with throwing up and anorexic symptoms. Has lost 3-4 pounds since last evaluation. She has seen a therapist the of family services at the [place] in the past 2 weeks. Plans to followup. The patient states she feels somewhat dizzy and has palpitations at times due to lack of eating. She requested potassium level to be checked today. She is here with HER-2 children, is currently able to care for them appropriately she states. Her husband works during the daytime and she does not wish for her her issues to be discussed with him. And she is agreeable to start treatment today, and denies any inability to care for herself, her children. States she feels safe at home. Denies any suicidal ideation, severe anxiety, chest pain, dyspnea, leg swelling.  Review of Systems See HPI otherwise negative.  reports that she has never smoked. She does not have any smokeless tobacco history on file.     Objective:   Physical Exam  Vitals reviewed. Constitutional: She is oriented to person, place, and time. No distress.       Appears fatigued, cachectic.  HENT:  Head: Normocephalic and atraumatic.  Mouth/Throat: Oropharynx is clear and moist. No oropharyngeal exudate.  Eyes: EOM are normal. Pupils are equal, round, and reactive to light.  Neck: Neck supple.  Cardiovascular: Normal rate, regular rhythm and normal heart sounds.   No murmur heard. Pulmonary/Chest: Effort normal and breath sounds normal. No respiratory distress. She has no wheezes. She has no rales.  Abdominal: Soft. Bowel sounds are normal.  Musculoskeletal: She exhibits no edema and no tenderness.  Neurological: She is alert and oriented to person, place, and time.  Skin: No rash noted. She is not diaphoretic.  Psychiatric:       Flat affect. Speech and dress are  normal. Good eye contact. Interacts appropriately with her children.    EKG: normal sinus rhythm, nonspecific ST and T waves changes, sinus bradycardia, flattening and biphasic T waves noted.  PHQ-9 score is 7, not difficult       Assessment & Plan:

## 2012-04-16 NOTE — Assessment & Plan Note (Signed)
Discuss treatment of bulimia with therapy, medication. Patient to followup at family services, will make nutrition referral today, start low dose Celexa 10 mg. Check potassium, electrolytes, TSH, CBC today. Discussed eating small snacks and avoiding purging. Followup in one to 2 weeks. Advised to call if any problems with medication or change in status.

## 2012-04-16 NOTE — Patient Instructions (Addendum)
Your weight is lower today. You should start trying to eat small snacks today. Avoid throwing up. Start taking potassium-sent to [facility]. Start the anti-depressant medication once daily. Make an appointment in 1-2 weeks for follow up. I will call about lab tests if abnormal. You may be called by our social worker [place].

## 2012-04-17 LAB — COMPREHENSIVE METABOLIC PANEL
BUN: 26 mg/dL — ABNORMAL HIGH (ref 6–23)
CO2: 33 mEq/L — ABNORMAL HIGH (ref 19–32)
Calcium: 10.5 mg/dL (ref 8.4–10.5)
Chloride: 93 mEq/L — ABNORMAL LOW (ref 96–112)
Creat: 0.97 mg/dL (ref 0.50–1.10)
Glucose, Bld: 77 mg/dL (ref 70–99)
Total Bilirubin: 0.6 mg/dL (ref 0.3–1.2)

## 2012-04-27 ENCOUNTER — Telehealth: Payer: Self-pay | Admitting: Clinical

## 2012-04-27 NOTE — Telephone Encounter (Signed)
Message copied by Theresia Bough on Fri Apr 27, 2012 10:09 AM ------      Message from: Durwin Reges      Created: Mon Apr 16, 2012 10:26 AM      Regarding: help with resources.       This patient came in for follow up. She is spanish speaking only, has severe bulimia. Is working on getting the orange card. I gave her info for [facility] of [place], but she has had difficulty with navigating the process. Can you help me with getting her set with a therapist? I have also referred her to nutrition with Dr. Gerilyn Pilgrim, am unsure of any local resources that would be more helpful.

## 2012-04-27 NOTE — Telephone Encounter (Signed)
Clinical Child psychotherapist (CSW) contacted pt home phone however unable to leave a message. CSW contacted cell phone however it was pt brother in law who informed CSW to call the home number. CSW unable to reach pt.  Theresia Bough, MSW, Theresia Majors [phone]

## 2012-04-30 ENCOUNTER — Ambulatory Visit: Payer: Self-pay | Admitting: Family Medicine

## 2012-05-11 ENCOUNTER — Emergency Department (HOSPITAL_COMMUNITY)
Admission: EM | Admit: 2012-05-11 | Discharge: 2012-05-11 | Disposition: A | Discharge status: Home / Self Care | Payer: Self-pay | Attending: Emergency Medicine | Admitting: Emergency Medicine

## 2012-05-11 ENCOUNTER — Encounter (HOSPITAL_COMMUNITY): Payer: Self-pay | Admitting: Emergency Medicine

## 2012-05-11 ENCOUNTER — Emergency Department (HOSPITAL_COMMUNITY): Payer: Self-pay

## 2012-05-11 ENCOUNTER — Other Ambulatory Visit: Payer: Self-pay

## 2012-05-11 DIAGNOSIS — R109 Unspecified abdominal pain: Secondary | ICD-10-CM | POA: Insufficient documentation

## 2012-05-11 DIAGNOSIS — R079 Chest pain, unspecified: Secondary | ICD-10-CM | POA: Insufficient documentation

## 2012-05-11 DIAGNOSIS — Z8659 Personal history of other mental and behavioral disorders: Secondary | ICD-10-CM

## 2012-05-11 DIAGNOSIS — K219 Gastro-esophageal reflux disease without esophagitis: Secondary | ICD-10-CM

## 2012-05-11 LAB — BASIC METABOLIC PANEL
BUN: 28 mg/dL — ABNORMAL HIGH (ref 6–23)
CO2: 35 mEq/L — ABNORMAL HIGH (ref 19–32)
Chloride: 95 mEq/L — ABNORMAL LOW (ref 96–112)
Creatinine, Ser: 0.86 mg/dL (ref 0.50–1.10)
GFR calc Af Amer: >90 mL/min (ref >90)
Potassium: 3.1 mEq/L — ABNORMAL LOW (ref 3.5–5.1)

## 2012-05-11 LAB — HEPATIC FUNCTION PANEL
ALT: 13 U/L (ref 0–35)
Albumin: 4.1 g/dL (ref 3.5–5.2)
Alkaline Phosphatase: 101 U/L (ref 39–117)
Total Protein: 7.6 g/dL (ref 6.0–8.3)

## 2012-05-11 LAB — CBC
HCT: 38.9 % (ref 36.0–46.0)
Hemoglobin: 13.5 g/dL (ref 12.0–15.0)
MCV: 86.1 fL (ref 78.0–100.0)
RBC: 4.52 MIL/uL (ref 3.87–5.11)
RDW: 12.3 % (ref 11.5–15.5)
WBC: 5.2 10*3/uL (ref 4.0–10.5)

## 2012-05-11 LAB — URINALYSIS, ROUTINE W REFLEX MICROSCOPIC
Glucose, UA: NEGATIVE mg/dL
Nitrite: NEGATIVE
Specific Gravity, Urine: 1.021 (ref 1.005–1.030)
pH: 8.5 — ABNORMAL HIGH (ref 5.0–8.0)

## 2012-05-11 LAB — URINE MICROSCOPIC-ADD ON

## 2012-05-11 LAB — LIPASE, BLOOD: Lipase: 65 U/L — ABNORMAL HIGH (ref 11–59)

## 2012-05-11 MED ORDER — GI COCKTAIL ~~LOC~~
30.0000 mL | Freq: Once | ORAL | Status: AC
  Administered 2012-05-11: 30 mL via ORAL
  Filled 2012-05-11: qty 30

## 2012-05-11 MED ORDER — POTASSIUM CHLORIDE 20 MEQ/15ML (10%) PO LIQD
40.0000 meq | Freq: Once | ORAL | Status: AC
  Administered 2012-05-11: 40 meq via ORAL
  Filled 2012-05-11: qty 30

## 2012-05-11 NOTE — ED Notes (Signed)
Pt c/o midsternal to epigastric pain x 2 days; pt sts some dizziness and SOB; pt sts hx bulimia and sts has not eaten today

## 2012-05-11 NOTE — ED Provider Notes (Signed)
History     CSN: [id number]  Arrival date & time 05/11/12  0920   First MD Initiated Contact with Patient 05/11/12 [id number]      Chief Complaint  Patient presents with  . Chest Pain  . Abdominal Pain    HPI Pt was seen at 1030.  Per pt, c/o gradual onset and persistence of constant mid-sternal chest and mid-epigastric abd "pain" for the past 2 days.  States the pain began after vomiting.  Denies back pain, no diarrhea, no SOB/cough, no palpitations, no black or blood in stools or emesis.    Past Medical History  Diagnosis Date  . Eating disorder     History reviewed. No pertinent past surgical history.  Family History  Problem Relation Age of Onset  . Heart disease Mother     History  Substance Use Topics  . Smoking status: Never Smoker   . Smokeless tobacco: Not on file  . Alcohol Use: No    Review of Systems ROS: Statement: All systems negative except as marked or noted in the HPI; Constitutional: Negative for fever and chills. ; ; Eyes: Negative for eye pain, redness and discharge. ; ; ENMT: Negative for ear pain, hoarseness, nasal congestion, sinus pressure and sore throat. ; ; Cardiovascular: +CP. Negative for  palpitations, diaphoresis, dyspnea and peripheral edema. ; ; Respiratory: Negative for cough, wheezing and stridor. ; ; Gastrointestinal: +N/V, abd pain. Negative for diarrhea, blood in stool, hematemesis, jaundice and rectal bleeding. . ; ; Genitourinary: Negative for dysuria, flank pain and hematuria. ; ; Musculoskeletal: Negative for back pain and neck pain. Negative for swelling and trauma.; ; Skin: Negative for pruritus, rash, abrasions, blisters, bruising and skin lesion.; ; Neuro: Negative for headache, lightheadedness and neck stiffness. Negative for weakness, altered level of consciousness , altered mental status, extremity weakness, paresthesias, involuntary movement, seizure and syncope.      Allergies  Review of patient's allergies indicates no known  allergies.  Home Medications   Current Outpatient Rx  Name Route Sig Dispense Refill  . CALCIUM CARBONATE-VITAMIN D 500-200 MG-UNIT PO TABS Oral Take 1 tablet by mouth 2 (two) times daily.    Marland Kitchen CITALOPRAM HYDROBROMIDE 10 MG PO TABS Oral Take 1 tablet (10 mg total) by mouth daily. 30 tablet 2  . POTASSIUM CHLORIDE ER 10 MEQ PO TBCR Oral Take 2 tablets (20 mEq total) by mouth 2 (two) times daily. 60 tablet 0  . PHENOL 1.4 % MT LIQD Mouth/Throat Use as directed 2 sprays in the mouth or throat as needed. 1 Bottle 0    BP 98/67  Pulse 90  Temp 97.2 F (36.2 C) (Oral)  Resp 16  SpO2 98%  Physical Exam 1035: Physical examination:  Nursing notes reviewed; Vital signs and O2 SAT reviewed;  Constitutional: Thin, Well hydrated, In no acute distress; Head:  Normocephalic, atraumatic; Eyes: EOMI, PERRL, No scleral icterus; ENMT: Mouth and pharynx normal, Mucous membranes moist; Neck: Supple, Full range of motion, No lymphadenopathy; Cardiovascular: Regular rate and rhythm, No gallop; Respiratory: Breath sounds clear & equal bilaterally, No wheezes.  Speaking full sentences with ease, Normal respiratory effort/excursion; Chest: +TTP bilat parasternal and anterior chest wall. Movement normal; Abdomen: Soft, +mild mid-epigastric area tenderness to palp.  No rebound or guarding. Nondistended, Normal bowel sounds; Genitourinary: No CVA tenderness; Extremities: Pulses normal, No tenderness, No edema, No calf edema or asymmetry.; Neuro: AA&Ox3, Major CN grossly intact.  Speech clear. No gross focal motor or sensory deficits in extremities.; Skin: Color  normal, Warm, Dry.   ED Course  Procedures   MDM  MDM Reviewed: nursing note and vitals Reviewed previous: ECG Interpretation: labs, ECG and x-ray    Date: 05/11/2012  Rate: 89  Rhythm: normal sinus rhythm  QRS Axis: normal  Intervals: normal  ST/T Wave abnormalities: normal  Conduction Disutrbances:none  Narrative Interpretation:   Old EKG  Reviewed: changes noted; improved NS T-wave abnormalities on today's EKG compared to previous EKG dated 04/16/2012.  Results for orders placed during the hospital encounter of 05/11/12  CBC      Component Value Range   WBC 5.2  4.0 - 10.5 K/uL   RBC 4.52  3.87 - 5.11 MIL/uL   Hemoglobin 13.5  12.0 - 15.0 g/dL   HCT 95.2  84.1 - 32.4 %   MCV 86.1  78.0 - 100.0 fL   MCH 29.9  26.0 - 34.0 pg   MCHC 34.7  30.0 - 36.0 g/dL   RDW 40.1  02.7 - 25.3 %   Platelets 340  150 - 400 K/uL  BASIC METABOLIC PANEL      Component Value Range   Sodium 139  135 - 145 mEq/L   Potassium 3.1 (*) 3.5 - 5.1 mEq/L   Chloride 95 (*) 96 - 112 mEq/L   CO2 35 (*) 19 - 32 mEq/L   Glucose, Bld 93  70 - 99 mg/dL   BUN 28 (*) 6 - 23 mg/dL   Creatinine, Ser 6.64  0.50 - 1.10 mg/dL   Calcium 9.9  8.4 - 40.3 mg/dL   GFR calc non Af Amer >90  >90 mL/min   GFR calc Af Amer >90  >90 mL/min  POCT I-STAT TROPONIN I      Component Value Range   Troponin i, poc 0.00  0.00 - 0.08 ng/mL   Comment 3           LIPASE, BLOOD      Component Value Range   Lipase 65 (*) 11 - 59 U/L  HEPATIC FUNCTION PANEL      Component Value Range   Total Protein 7.6  6.0 - 8.3 g/dL   Albumin 4.1  3.5 - 5.2 g/dL   AST 22  0 - 37 U/L   ALT 13  0 - 35 U/L   Alkaline Phosphatase 101  39 - 117 U/L   Total Bilirubin 0.2 (*) 0.3 - 1.2 mg/dL   Bilirubin, Direct PENDING  0.0 - 0.3 mg/dL   Indirect Bilirubin PENDING  0.3 - 0.9 mg/dL  URINALYSIS, ROUTINE W REFLEX MICROSCOPIC      Component Value Range   Color, Urine YELLOW  YELLOW   APPearance CLOUDY (*) CLEAR   Specific Gravity, Urine 1.021  1.005 - 1.030   pH 8.5 (*) 5.0 - 8.0   Glucose, UA NEGATIVE  NEGATIVE mg/dL   Hgb urine dipstick NEGATIVE  NEGATIVE   Bilirubin Urine NEGATIVE  NEGATIVE   Ketones, ur NEGATIVE  NEGATIVE mg/dL   Protein, ur 30 (*) NEGATIVE mg/dL   Urobilinogen, UA 0.2  0.0 - 1.0 mg/dL   Nitrite NEGATIVE  NEGATIVE   Leukocytes, UA TRACE (*) NEGATIVE  POCT PREGNANCY,  URINE      Component Value Range   Preg Test, Ur NEGATIVE  NEGATIVE  URINE MICROSCOPIC-ADD ON      Component Value Range   Squamous Epithelial / LPF FEW (*) RARE   WBC, UA 0-2  <3 WBC/hpf   Bacteria, UA FEW (*) RARE  Casts GRANULAR CAST (*) NEGATIVE   Crystals TRIPLE PHOSPHATE CRYSTALS (*) NEGATIVE   Urine-Other AMORPHOUS URATES/PHOSPHATES     Dg Chest 2 View 05/11/2012  *RADIOLOGY REPORT*  Clinical Data:  Chest pain.  CHEST - 2 VIEW  Comparison: 02/11/2011  Findings: The heart size and mediastinal contours are within normal limits.  Both lungs are clear.  The visualized skeletal structures are unremarkable.  Snaps are identified overlying both upper lobes.  IMPRESSION: No active cardiopulmonary abnormalities.  Original Report Authenticated By: Rosealee Albee, M.D.    Results for [name], [name] (MRN [id number]) as of 05/11/2012 13:20  Ref. Range 07/04/2011 13:15 07/04/2011 17:29 07/27/2011 00:09 07/27/2011 05:38 04/16/2012 11:51 05/11/2012 09:50  Potassium Latest Range: 3.5-5.1 mEq/L 2.4 (LL) 2.7 (LL) 2.8 (L) 3.4 (L) 3.3 (L) 3.1 (L)  Chloride Latest Range: 96-112 mEq/L 89 (L) 93 (L) 92 (L) 102 93 (L) 95 (L)  CO2 Latest Range: 19-32 mEq/L  33 (H)   33 (H) 35 (H)     Results for [name], [name] (MRN [id number]) as of 05/11/2012 13:20  Ref. Range 07/04/2011 13:00 07/26/2011 23:54 05/11/2012 09:50  Lipase Latest Range: 11-59 U/L 69 (H) 65 (H) 65 (H)     1325:  Labs as above; are consistent with her previous labs.  Feels improved after meds and wants to go home now.  Family wants to take her home now.  Doubt ACS with 2d hx of constant pain, normal EKG and troponin, and temporal relationship to s/p vomiting.  PERC negative.  Potassium repleted PO.  Dx testing d/w pt and family.  Questions answered.  Verb understanding, agreeable to d/c home with outpt f/u.      Laray Anger, DO 05/12/12 1635

## 2012-05-25 ENCOUNTER — Ambulatory Visit: Payer: Self-pay | Admitting: Family Medicine

## 2012-05-28 ENCOUNTER — Other Ambulatory Visit: Payer: Self-pay | Admitting: Family Medicine

## 2012-06-10 ENCOUNTER — Emergency Department (HOSPITAL_COMMUNITY)
Admission: EM | Admit: 2012-06-10 | Discharge: 2012-06-10 | Disposition: A | Discharge status: Home / Self Care | Payer: Self-pay | Attending: Emergency Medicine | Admitting: Emergency Medicine

## 2012-06-10 ENCOUNTER — Emergency Department (HOSPITAL_COMMUNITY): Payer: Self-pay

## 2012-06-10 DIAGNOSIS — E876 Hypokalemia: Secondary | ICD-10-CM | POA: Insufficient documentation

## 2012-06-10 DIAGNOSIS — F502 Bulimia nervosa, unspecified: Secondary | ICD-10-CM | POA: Insufficient documentation

## 2012-06-10 DIAGNOSIS — M542 Cervicalgia: Secondary | ICD-10-CM | POA: Insufficient documentation

## 2012-06-10 DIAGNOSIS — M546 Pain in thoracic spine: Secondary | ICD-10-CM | POA: Insufficient documentation

## 2012-06-10 DIAGNOSIS — R079 Chest pain, unspecified: Secondary | ICD-10-CM | POA: Insufficient documentation

## 2012-06-10 DIAGNOSIS — M25519 Pain in unspecified shoulder: Secondary | ICD-10-CM | POA: Insufficient documentation

## 2012-06-10 LAB — BASIC METABOLIC PANEL
BUN: 41 mg/dL — ABNORMAL HIGH (ref 6–23)
BUN: 36 mg/dL — ABNORMAL HIGH (ref 6–23)
Creatinine, Ser: 2.04 mg/dL — ABNORMAL HIGH (ref 0.50–1.10)
Creatinine, Ser: 1.66 mg/dL — ABNORMAL HIGH (ref 0.50–1.10)
GFR calc Af Amer: 37 mL/min — ABNORMAL LOW (ref >90)
GFR calc Af Amer: 48 mL/min — ABNORMAL LOW (ref >90)
GFR calc non Af Amer: 32 mL/min — ABNORMAL LOW (ref >90)
GFR calc non Af Amer: 41 mL/min — ABNORMAL LOW (ref >90)
Glucose, Bld: 77 mg/dL (ref 70–99)

## 2012-06-10 LAB — CBC WITH DIFFERENTIAL/PLATELET
Basophils Relative: 0 % (ref 0–1)
Eosinophils Absolute: 0.1 10*3/uL (ref 0.0–0.7)
Eosinophils Relative: 1 % (ref 0–5)
HCT: 36.4 % (ref 36.0–46.0)
Hemoglobin: 12.8 g/dL (ref 12.0–15.0)
MCH: 29.9 pg (ref 26.0–34.0)
MCHC: 35.2 g/dL (ref 30.0–36.0)
MCV: 85 fL (ref 78.0–100.0)
Monocytes Absolute: 0.6 10*3/uL (ref 0.1–1.0)
Monocytes Relative: 8 % (ref 3–12)
Neutrophils Relative %: 47 % (ref 43–77)

## 2012-06-10 MED ORDER — POTASSIUM CHLORIDE 10 MEQ/100ML IV SOLN
10.0000 meq | Freq: Once | INTRAVENOUS | Status: AC
  Administered 2012-06-10: 10 meq via INTRAVENOUS
  Filled 2012-06-10: qty 100

## 2012-06-10 MED ORDER — POTASSIUM CHLORIDE CRYS ER 20 MEQ PO TBCR
40.0000 meq | EXTENDED_RELEASE_TABLET | Freq: Once | ORAL | Status: AC
  Administered 2012-06-10: 40 meq via ORAL
  Filled 2012-06-10: qty 2

## 2012-06-10 MED ORDER — POTASSIUM CHLORIDE CRYS ER 20 MEQ PO TBCR
20.0000 meq | EXTENDED_RELEASE_TABLET | Freq: Once | ORAL | Status: DC
  Filled 2012-06-10: qty 1

## 2012-06-10 MED ORDER — KETOROLAC TROMETHAMINE 30 MG/ML IJ SOLN
30.0000 mg | Freq: Once | INTRAMUSCULAR | Status: AC
  Administered 2012-06-10: 30 mg via INTRAVENOUS
  Filled 2012-06-10: qty 1

## 2012-06-10 MED ORDER — LACTATED RINGERS IV BOLUS (SEPSIS)
1000.0000 mL | Freq: Once | INTRAVENOUS | Status: AC
Start: 2012-06-10 — End: 2012-06-11
  Administered 2012-06-10: 1000 mL via INTRAVENOUS

## 2012-06-10 MED ORDER — SODIUM CHLORIDE 0.9 % IV BOLUS (SEPSIS)
1000.0000 mL | Freq: Once | INTRAVENOUS | Status: AC
  Administered 2012-06-10: 1000 mL via INTRAVENOUS

## 2012-06-10 MED ORDER — POTASSIUM CHLORIDE CRYS ER 20 MEQ PO TBCR
80.0000 meq | EXTENDED_RELEASE_TABLET | Freq: Once | ORAL | Status: AC
  Administered 2012-06-10: 80 meq via ORAL
  Filled 2012-06-10: qty 4

## 2012-06-10 MED ORDER — KETOROLAC TROMETHAMINE 60 MG/2ML IM SOLN
60.0000 mg | Freq: Once | INTRAMUSCULAR | Status: DC
  Filled 2012-06-10: qty 2

## 2012-06-10 NOTE — ED Notes (Signed)
Substernal cp since Monday, that is reproducible; and upper back, neck pain. No coughing.

## 2012-06-10 NOTE — ED Notes (Signed)
CDU full at the time. RN to call back when room is available.

## 2012-06-10 NOTE — ED Notes (Signed)
CDU full at the time. Nurse to call back when patient can be accepted.

## 2012-06-10 NOTE — ED Notes (Addendum)
Spanish interpreter was used to communicate with patient.

## 2012-06-10 NOTE — ED Notes (Signed)
Pt called her Husband and left [place] his number to call him and update on information. Husband: Tovias   Phone #: [phone]

## 2012-06-10 NOTE — ED Provider Notes (Signed)
History     CSN: [id number]  Arrival date & time 06/10/12  1221   First MD Initiated Contact with Patient 06/10/12 1353      Chief Complaint  Patient presents with  . Chest Pain    (Consider location/radiation/quality/duration/timing/severity/associated sxs/prior treatment) Patient is a 26 y.o. female presenting with chest pain. The history is provided by the patient.  Chest Pain The chest pain began 1 - 2 weeks ago. Chest pain occurs constantly. The chest pain is unchanged. Primary symptoms include vomiting. Pertinent negatives for primary symptoms include no fever, no shortness of breath and no cough. Associated symptoms comments: She reports bilateral upper back, shoulder and neck pain with little pain in bilateral chest. No fever, cough, SOB. No known trauma. The patient reports having active bulimia and is vomiting with any solid PO intake everyday. She was seeing a physician in the past few months but no longer has benefits that cover the cost and does not go. She does not drink fluids in any quantity. She reports she continues to have urination and bowel movements. No abdominal pain, dysuria, hematemesis, bloody or melanic stool. The pain is worse with movement. . She tried nothing for the symptoms.     No past medical history on file.  No past surgical history on file.  No family history on file.  History  Substance Use Topics  . Smoking status: Never Smoker   . Smokeless tobacco: Not on file  . Alcohol Use: No    OB History    Grav Para Term Preterm Abortions TAB SAB Ect Mult Living                  Review of Systems  Constitutional: Negative for fever.  HENT: Positive for neck pain.   Respiratory: Negative for cough and shortness of breath.   Cardiovascular: Positive for chest pain.  Gastrointestinal: Positive for vomiting.       See HPI.  Genitourinary: Negative for dysuria.  Musculoskeletal: Positive for back pain.  Neurological: Negative for headaches.     Allergies  Review of patient's allergies indicates no known allergies.  Home Medications  No current outpatient prescriptions on file.  BP 101/67  Pulse 65  Temp 99.2 F (37.3 C) (Oral)  Resp 16  SpO2 100%  Physical Exam  Constitutional: She is oriented to person, place, and time. She appears well-developed and well-nourished.  HENT:  Head: Normocephalic.  Mouth/Throat: Oropharynx is clear and moist.  Eyes: Pupils are equal, round, and reactive to light.  Neck: Normal range of motion. Neck supple.  Cardiovascular: Normal rate and regular rhythm.        Pulses in upper extremities are 2+ and equal.   Pulmonary/Chest: Effort normal and breath sounds normal. She has no wheezes. She has no rales.       Mild anterior chest wall tenderness.   Abdominal: Soft. Bowel sounds are normal. There is no tenderness. There is no rebound and no guarding.  Musculoskeletal: Normal range of motion. She exhibits no edema.       Full range of motion of bilateral upper extremities. Tender musculature without palpable spasm or mass to bilateral trapezius and paracervical areas. Equal grip strength bilaterally.  Neurological: She is alert and oriented to person, place, and time.  Skin: Skin is warm and dry. No rash noted.  Psychiatric: She has a normal mood and affect.    ED Course  Procedures (including critical care time)   Labs Reviewed  CBC WITH  DIFFERENTIAL  BASIC METABOLIC PANEL   No results found.  Dg Chest 2 View  06/10/2012  *RADIOLOGY REPORT*  Clinical Data: Chest pain.  CHEST - 2 VIEW  Comparison: Chest x-ray 11/02/2011.  Findings: Lung volumes are normal.  No consolidative airspace disease.  No pleural effusions.  No pneumothorax.  No pulmonary nodule or mass noted.  Pulmonary vasculature and the cardiomediastinal silhouette are within normal limits.  IMPRESSION: 1. No radiographic evidence of acute cardiopulmonary disease.   Original Report Authenticated By: Florencia Reasons,  M.D.    No diagnosis found.  1. Muscular pain 2. Active bulimia   MDM  Plan: will check electrolytes, hgb, cxr for abnormalities, however, feel pain is musculoskeletal. She is given Toradol in ED.   Potassium low - plan to supplement, check magnesium, hydrate. Move to CDU. Recheck potassium in 4 hours after oral supplementation.        Rodena Medin, PA-C 06/10/12 1646  Rodena Medin, PA-C 06/10/12 1713

## 2012-06-10 NOTE — ED Notes (Signed)
Spanish interpreter used and pt denies any questions upon discharge.

## 2012-06-10 NOTE — ED Notes (Signed)
Pt blood pressure is low, MD is made aware of pt status. Pt is asymptomatic with low BP, new orders verbally given for iv fluids. Pt will be medicated per [facility], will continue to monitor pt.

## 2012-06-14 NOTE — ED Provider Notes (Signed)
Medical screening examination/treatment/procedure(s) were performed by non-physician practitioner and as supervising physician I was immediately available for consultation/collaboration.  Hurman Horn, MD 06/14/12 [phone]

## 2012-09-12 ENCOUNTER — Encounter (HOSPITAL_COMMUNITY): Payer: Self-pay | Admitting: Emergency Medicine

## 2012-09-12 ENCOUNTER — Emergency Department (HOSPITAL_COMMUNITY)
Admission: EM | Admit: 2012-09-12 | Discharge: 2012-09-13 | Disposition: A | Discharge status: Home / Self Care | Payer: Self-pay | Attending: Emergency Medicine | Admitting: Emergency Medicine

## 2012-09-12 DIAGNOSIS — E878 Other disorders of electrolyte and fluid balance, not elsewhere classified: Secondary | ICD-10-CM | POA: Insufficient documentation

## 2012-09-12 DIAGNOSIS — M545 Low back pain, unspecified: Secondary | ICD-10-CM | POA: Insufficient documentation

## 2012-09-12 DIAGNOSIS — R109 Unspecified abdominal pain: Secondary | ICD-10-CM | POA: Insufficient documentation

## 2012-09-12 DIAGNOSIS — R3989 Other symptoms and signs involving the genitourinary system: Secondary | ICD-10-CM | POA: Insufficient documentation

## 2012-09-12 DIAGNOSIS — F502 Bulimia nervosa, unspecified: Secondary | ICD-10-CM | POA: Insufficient documentation

## 2012-09-12 DIAGNOSIS — E876 Hypokalemia: Secondary | ICD-10-CM | POA: Insufficient documentation

## 2012-09-12 DIAGNOSIS — Z3202 Encounter for pregnancy test, result negative: Secondary | ICD-10-CM | POA: Insufficient documentation

## 2012-09-12 LAB — CBC WITH DIFFERENTIAL/PLATELET
Basophils Absolute: 0 10*3/uL (ref 0.0–0.1)
Basophils Relative: 0 % (ref 0–1)
Eosinophils Absolute: 0.1 10*3/uL (ref 0.0–0.7)
Eosinophils Relative: 1 % (ref 0–5)
HCT: 35.7 % — ABNORMAL LOW (ref 36.0–46.0)
Hemoglobin: 12.7 g/dL (ref 12.0–15.0)
Lymphocytes Relative: 33 % (ref 12–46)
Lymphs Abs: 2.3 10*3/uL (ref 0.7–4.0)
MCH: 30.5 pg (ref 26.0–34.0)
MCHC: 35.6 g/dL (ref 30.0–36.0)
MCV: 85.6 fL (ref 78.0–100.0)
Monocytes Absolute: 0.6 10*3/uL (ref 0.1–1.0)
Monocytes Relative: 8 % (ref 3–12)
Neutro Abs: 4 10*3/uL (ref 1.7–7.7)
Neutrophils Relative %: 58 % (ref 43–77)
Platelets: 343 10*3/uL (ref 150–400)
RBC: 4.17 MIL/uL (ref 3.87–5.11)
RDW: 11.7 % (ref 11.5–15.5)
WBC: 6.9 10*3/uL (ref 4.0–10.5)

## 2012-09-12 LAB — COMPREHENSIVE METABOLIC PANEL
ALT: 16 U/L (ref 0–35)
AST: 25 U/L (ref 0–37)
Albumin: 4.1 g/dL (ref 3.5–5.2)
Alkaline Phosphatase: 114 U/L (ref 39–117)
BUN: 7 mg/dL (ref 6–23)
CO2: 29 mEq/L (ref 19–32)
Calcium: 10.2 mg/dL (ref 8.4–10.5)
Chloride: 92 mEq/L — ABNORMAL LOW (ref 96–112)
Creatinine, Ser: 0.6 mg/dL (ref 0.50–1.10)
GFR calc Af Amer: >90 mL/min (ref >90)
GFR calc non Af Amer: >90 mL/min (ref >90)
Glucose, Bld: 97 mg/dL (ref 70–99)
Potassium: 2.6 mEq/L — CL (ref 3.5–5.1)
Sodium: 133 mEq/L — ABNORMAL LOW (ref 135–145)
Total Bilirubin: 0.2 mg/dL — ABNORMAL LOW (ref 0.3–1.2)
Total Protein: 7.8 g/dL (ref 6.0–8.3)

## 2012-09-12 LAB — URINALYSIS, ROUTINE W REFLEX MICROSCOPIC
Bilirubin Urine: NEGATIVE
Glucose, UA: NEGATIVE mg/dL
Hgb urine dipstick: NEGATIVE
Ketones, ur: NEGATIVE mg/dL
Leukocytes, UA: NEGATIVE
Nitrite: NEGATIVE
Protein, ur: NEGATIVE mg/dL
Specific Gravity, Urine: 1.004 — ABNORMAL LOW (ref 1.005–1.030)
Urobilinogen, UA: 0.2 mg/dL (ref 0.0–1.0)
pH: 7.5 (ref 5.0–8.0)

## 2012-09-12 LAB — WET PREP, GENITAL
Clue Cells Wet Prep HPF POC: NONE SEEN
Trich, Wet Prep: NONE SEEN
WBC, Wet Prep HPF POC: NONE SEEN
Yeast Wet Prep HPF POC: NONE SEEN

## 2012-09-12 LAB — LIPASE, BLOOD: Lipase: 44 U/L (ref 11–59)

## 2012-09-12 LAB — MAGNESIUM: Magnesium: 2.1 mg/dL (ref 1.5–2.5)

## 2012-09-12 MED ORDER — MORPHINE SULFATE 4 MG/ML IJ SOLN
4.0000 mg | Freq: Once | INTRAMUSCULAR | Status: AC
  Administered 2012-09-12: 4 mg via INTRAVENOUS
  Filled 2012-09-12: qty 1

## 2012-09-12 MED ORDER — POTASSIUM CHLORIDE CRYS ER 20 MEQ PO TBCR
120.0000 meq | EXTENDED_RELEASE_TABLET | Freq: Once | ORAL | Status: AC
  Administered 2012-09-12: 120 meq via ORAL
  Filled 2012-09-12: qty 6

## 2012-09-12 MED ORDER — ONDANSETRON HCL 4 MG/2ML IJ SOLN
4.0000 mg | Freq: Once | INTRAMUSCULAR | Status: AC
  Administered 2012-09-12: 4 mg via INTRAVENOUS
  Filled 2012-09-12: qty 2

## 2012-09-12 MED ORDER — SODIUM CHLORIDE 0.9 % IV BOLUS (SEPSIS)
1000.0000 mL | Freq: Once | INTRAVENOUS | Status: AC
  Administered 2012-09-12: 1000 mL via INTRAVENOUS

## 2012-09-12 NOTE — ED Notes (Signed)
Interpreter phone used for triage, Spanish.

## 2012-09-12 NOTE — ED Provider Notes (Signed)
History     CSN: [id number]  Arrival date & time 09/12/12  1909   First MD Initiated Contact with Patient 09/12/12 2155      Chief Complaint  Patient presents with  . Abdominal Pain  . Back Pain    (Consider location/radiation/quality/duration/timing/severity/associated sxs/prior treatment) The history is provided by the patient and medical records. A language interpreter was used.   26 year old female with a past medical history significant for chronic bulimia and hypokalemia presents emergency department with chief complaint of lower abdominal pain and burning with urination.  Onset of symptoms was 4 days ago.  She's had increased burning with urination.  Last menstrual period was November 9.  This is her first menstrual period in 2 years. Denies any foul odor of urine, urinary frequency or hematuria.  Patient denies any vaginal symptoms she is sexually active in a monogamous relationship.  She does have bilateral lower back pain. Denies fevers, chills, myalgias, arthralgias. Denies DOE, SOB, chest tightness or pressure, radiation to left arm, jaw or back, or diaphoresis. Denies dysuria, flank pain, suprapubic pain, frequency, urgency, or hematuria. Denies headaches, light headedness, weakness, visual disturbances. Denies abdominal pain, nausea, diarrhea or constipation.   History reviewed. No pertinent past medical history.  History reviewed. No pertinent past surgical history.  No family history on file.  History  Substance Use Topics  . Smoking status: Never Smoker   . Smokeless tobacco: Not on file  . Alcohol Use: No    OB History    Grav Para Term Preterm Abortions TAB SAB Ect Mult Living                  Review of Systems Ten systems are reviewed and are negative for acute change except as noted in the HPI\  Allergies  Review of patient's allergies indicates no known allergies.  Home Medications  No current outpatient prescriptions on file.  BP 109/77   Pulse 78  Temp 97.9 F (36.6 C) (Oral)  Resp 18  SpO2 100%  LMP 08/11/2012  Physical Exam  Nursing note and vitals reviewed. Constitutional: She is oriented to person, place, and time.       Very thin Hispanic female in no acute  HENT:  Head: Atraumatic.       Significant parotid hypertrophy  Eyes: Conjunctivae normal and EOM are normal. Pupils are equal, round, and reactive to light. Right eye exhibits no discharge. Left eye exhibits no discharge.  Neck: Normal range of motion. Neck supple. No tracheal deviation present.  Cardiovascular: Normal rate, regular rhythm and normal heart sounds.   Pulmonary/Chest: Effort normal and breath sounds normal. No respiratory distress. She has no wheezes.  Abdominal: Soft. She exhibits no distension and no mass. There is tenderness. There is no rebound and no guarding.       Scaphoid abdomen  Genitourinary:       Pelvic exam: normal external genitalia, vulva, vagina, cervix, uterus and adnexa.   Musculoskeletal: Normal range of motion.  Neurological: She is alert and oriented to person, place, and time. No cranial nerve deficit. Coordination normal.  Skin: Skin is warm and dry. No rash noted. She is not diaphoretic.    ED Course  Procedures (including critical care time)  Labs Reviewed  URINALYSIS, ROUTINE W REFLEX MICROSCOPIC - Abnormal; Notable for the following:    Specific Gravity, Urine 1.004 (*)     All other components within normal limits  CBC WITH DIFFERENTIAL - Abnormal; Notable for the following:  HCT 35.7 (*)     All other components within normal limits  COMPREHENSIVE METABOLIC PANEL - Abnormal; Notable for the following:    Sodium 133 (*)     Potassium 2.6 (*)     Chloride 92 (*)     Total Bilirubin 0.2 (*)     All other components within normal limits  LIPASE, BLOOD  POCT PREGNANCY, URINE  MAGNESIUM  WET PREP, GENITAL  GC/CHLAMYDIA PROBE AMP   No results found.  Date: 09/12/2012  Rate: 88  Rhythm: normal sinus  rhythm  QRS Axis: right  Intervals: normal  ST/T Wave abnormalities: nonspecific T wave changes  Conduction Disutrbances:none  Narrative Interpretation:   Old EKG Reviewed: unchanged    1. Abdominal pain   2. Bulimia nervosa   3. Hypokalemia   4. Hypochloremia       MDM  11:11 PM Filed Vitals:   09/12/12 1916 09/12/12 2218  BP: 98/62 109/77  Pulse: 91 78  Temp: 97.9 F (36.6 C)   TempSrc: Oral   Resp: 20 18  SpO2: 98% 100%   Patient with sig and hypo K. anemia and hypochloremia this has been repleted with oral potassium 120 MEq. And normal saline Urine is negative. Negative vaginal exam. Patent amenorrhea likely has to do with the fact that she is a 26 y/o female who only ways 80 pounds.  I explained to her that females who do not have enough body fat will not have a period.  I alos counseled her about the effects of bulimia and her clear risk of death from this disorder.   Her VS are stable. The patient states she has a psychiatrist.   I will DC patent with pyridium and naproxen for sxs relief and gyn f/u. Discussed reasons to seek immediate care. Patient expresses understanding and agrees with plan.        Arthor Captain, PA-C 09/14/12 1000

## 2012-09-12 NOTE — ED Notes (Addendum)
Pt presents with lower abdominal pain and lower back pain, denies any radiation of pain.  Pt complaining of difficulty and burning with urination.  Pt states she was told by another doctor that she has kidney problems, pt is unaware of any specifics.  Pt admits to being bulimic.

## 2012-09-13 MED ORDER — NAPROXEN 375 MG PO TABS: 375.0000 mg | ORAL_TABLET | Freq: Two times a day (BID) | ORAL | Status: DC

## 2012-09-13 MED ORDER — PHENAZOPYRIDINE HCL 200 MG PO TABS: 200.0000 mg | ORAL_TABLET | Freq: Three times a day (TID) | ORAL | Status: DC | PRN

## 2012-09-14 LAB — GC/CHLAMYDIA PROBE AMP: CT Probe RNA: NEGATIVE

## 2012-09-17 NOTE — ED Provider Notes (Signed)
Medical screening examination/treatment/procedure(s) were performed by non-physician practitioner and as supervising physician I was immediately available for consultation/collaboration.  Raeford Razor, MD 09/17/12 [phone]

## 2012-10-03 ENCOUNTER — Encounter (HOSPITAL_COMMUNITY): Payer: Self-pay | Admitting: *Deleted

## 2012-10-03 ENCOUNTER — Emergency Department (HOSPITAL_COMMUNITY)
Admission: EM | Admit: 2012-10-03 | Discharge: 2012-10-03 | Disposition: A | Discharge status: Home / Self Care | Payer: Self-pay | Attending: Emergency Medicine | Admitting: Emergency Medicine

## 2012-10-03 DIAGNOSIS — L290 Pruritus ani: Secondary | ICD-10-CM | POA: Insufficient documentation

## 2012-10-03 DIAGNOSIS — K5900 Constipation, unspecified: Secondary | ICD-10-CM | POA: Insufficient documentation

## 2012-10-03 DIAGNOSIS — K625 Hemorrhage of anus and rectum: Secondary | ICD-10-CM | POA: Insufficient documentation

## 2012-10-03 LAB — POCT I-STAT, CHEM 8
BUN: 6 mg/dL (ref 6–23)
Calcium, Ion: 1.29 mmol/L — ABNORMAL HIGH (ref 1.12–1.23)
Creatinine, Ser: 0.7 mg/dL (ref 0.50–1.10)
Glucose, Bld: 82 mg/dL (ref 70–99)
Hemoglobin: 13.6 g/dL (ref 12.0–15.0)
Sodium: 141 mEq/L (ref 135–145)
TCO2: 27 mmol/L (ref 0–100)

## 2012-10-03 MED ORDER — POLYETHYLENE GLYCOL 3350 17 GM/SCOOP PO POWD: 17.0000 g | Freq: Every day | ORAL | Status: DC

## 2012-10-03 MED ORDER — HYDROCORTISONE 1 % EX CREA
TOPICAL_CREAM | Freq: Three times a day (TID) | CUTANEOUS | Status: DC
  Filled 2012-10-03: qty 28

## 2012-10-03 NOTE — ED Notes (Signed)
Pt education given and verbal understanding stated.

## 2012-10-03 NOTE — ED Notes (Signed)
Pt states the small amount of blood seen in her stool began 4 days ago on Saturday. Rates pain 8/10 at rectal area, hurts when she sits and when using the bathroom. A&Ox4, ambulatory, nad.

## 2012-10-03 NOTE — ED Provider Notes (Signed)
History     CSN: [id number]  Arrival date & time 10/03/12  1308   First MD Initiated Contact with Patient 10/03/12 1032      Chief Complaint  Patient presents with  . 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 Patient is Hispanic, [place] phone interpreter use.  Pt reports for the past 3 days she is having rectal pain and itching sensation at her anus.  Has hx of constipation and is having pain on defecation.  Does not notice BRBPR but endorse dark stool which has been ongoing for several months.  Does not take any NSAIDs.  Is having 1 BM daily.  Onset gradual, persistent, mild-moderate in severity, no specific treatment tried.  Denies fever, chills, v/d, back pain, dysuria.  Does not recall LBM.  Does endorse mild lower abdominal pain but this is intermittent and ongoing for months.  No dysuria or vaginal discharge.  No hematuria.    History reviewed. No pertinent past medical history.  History reviewed. No pertinent past surgical history.  No family history on file.  History  Substance Use Topics  . Smoking status: Never Smoker   . Smokeless tobacco: Not on file  . Alcohol Use: No    OB History    Grav Para Term Preterm Abortions TAB SAB Ect Mult Living                  Review of Systems  Constitutional: Negative for fever and chills.  Gastrointestinal: Positive for constipation, anal bleeding and rectal pain.  Genitourinary: Negative for dysuria, hematuria, vaginal bleeding, vaginal discharge and pelvic pain.  Skin: Negative for rash.  Neurological: Negative for light-headedness and headaches.    Allergies  Review of patient's allergies indicates no known allergies.  Home Medications   Current Outpatient Rx  Name  Route  Sig  Dispense  Refill  . PHENAZOPYRIDINE HCL 200 MG PO TABS   Oral   Take  200 mg by mouth 3 (three) times daily as needed. For pain           BP 94/52  Pulse 77  Temp 97.6 F (36.4 C) (Oral)  Resp 16  SpO2 99%  LMP 08/11/2012  Physical Exam  Nursing note and vitals reviewed. Constitutional: She is oriented to person, place, and time. She appears well-developed and well-nourished. No distress.  HENT:  Head: Normocephalic and atraumatic.  Mouth/Throat: Oropharynx is clear and moist.  Eyes: Conjunctivae normal are normal.  Neck: Normal range of motion. Neck supple.  Cardiovascular: Normal rate and regular rhythm.   Pulmonary/Chest: Effort normal and breath sounds normal. No respiratory distress. She exhibits no tenderness.  Abdominal: Soft. There is no tenderness.  Genitourinary: Rectum normal. Rectal exam shows no external hemorrhoid, no internal hemorrhoid, no fissure, no mass, no tenderness and anal tone normal. Guaiac negative stool.       Chaperone present  Musculoskeletal: She exhibits no edema.  Neurological: She is alert and oriented to person, place, and time.  Skin: Skin is warm. No rash noted.  Psychiatric: She has a normal mood and affect.    ED Course  Procedures (including critical care time)  Labs Reviewed - No data to display No results found.   No diagnosis found.  Results for orders placed during the hospital encounter of 10/03/12  OCCULT BLOOD, POC DEVICE      Component Value Range   Fecal Occult  Bld NEGATIVE  NEGATIVE  POCT I-STAT, CHEM 8      Component Value Range   Sodium 141  135 - 145 mEq/L   Potassium 3.8  3.5 - 5.1 mEq/L   Chloride 105  96 - 112 mEq/L   BUN 6  6 - 23 mg/dL   Creatinine, Ser 1.61  0.50 - 1.10 mg/dL   Glucose, Bld 82  70 - 99 mg/dL   Calcium, Ion 0.96 (*) 1.12 - 1.23 mmol/L   TCO2 27  0 - 100 mmol/L   Hemoglobin 13.6  12.0 - 15.0 g/dL   HCT 04.5  40.9 - 81.1 %   No results found.  1. Pruritus ani  MDM  Pt presents with rectal itch and rectal pain x 3 days.  Exam is unremarkable, no  specific finding concerning for external/internal hemorrhoid, anal fissure, or mass on digital exam.  Hemoccult negative.  Abdomen is soft and non tender on exam.  Preparation H given here in ER.    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  Will check istat for evaluation of possible electrolytes abnormality.     12:12 PM Normal electrolytes, hemoccult negative, no other concerning finding.  Pt to f/u with PCP for further care.  Will continue Preparation H for comfort relief.  Stool softener prescribed.    BP 102/70  Pulse 85  Temp 97.6 F (36.4 C) (Oral)  Resp 18  SpO2 99%  LMP 08/11/2012  I have reviewed nursing notes and vital signs.  I reviewed available ER/hospitalization records thought the EMR     Fayrene Helper, [place] 10/03/12 1213

## 2012-10-03 NOTE — ED Notes (Signed)
Per interpretor phone service;  PT speaks spanish. Pt here with complaints of pressure on anus, pain to have BM, and itching.  PT has some rectal bleeding and some abdominal pain.

## 2012-10-03 NOTE — ED Provider Notes (Signed)
Medical screening examination/treatment/procedure(s) were performed by non-physician practitioner and as supervising physician I was immediately available for consultation/collaboration.   Avien Taha L Makira Holleman, MD 10/03/12 1724 

## 2012-11-06 ENCOUNTER — Emergency Department (HOSPITAL_COMMUNITY): Payer: Self-pay

## 2012-11-06 ENCOUNTER — Encounter (HOSPITAL_COMMUNITY): Payer: Self-pay | Admitting: *Deleted

## 2012-11-06 ENCOUNTER — Emergency Department (HOSPITAL_COMMUNITY)
Admission: EM | Admit: 2012-11-06 | Discharge: 2012-11-06 | Disposition: A | Discharge status: Home / Self Care | Payer: Self-pay | Attending: Emergency Medicine | Admitting: Emergency Medicine

## 2012-11-06 DIAGNOSIS — Z79899 Other long term (current) drug therapy: Secondary | ICD-10-CM | POA: Insufficient documentation

## 2012-11-06 DIAGNOSIS — R071 Chest pain on breathing: Secondary | ICD-10-CM | POA: Insufficient documentation

## 2012-11-06 DIAGNOSIS — R0602 Shortness of breath: Secondary | ICD-10-CM | POA: Insufficient documentation

## 2012-11-06 DIAGNOSIS — R0789 Other chest pain: Secondary | ICD-10-CM

## 2012-11-06 DIAGNOSIS — Z8659 Personal history of other mental and behavioral disorders: Secondary | ICD-10-CM | POA: Insufficient documentation

## 2012-11-06 HISTORY — DX: Bulimia nervosa, unspecified: F50.20

## 2012-11-06 HISTORY — DX: Bulimia nervosa: F50.2

## 2012-11-06 LAB — POCT I-STAT TROPONIN I: Troponin i, poc: 0 ng/mL (ref 0.00–0.08)

## 2012-11-06 LAB — POCT I-STAT, CHEM 8
Creatinine, Ser: 0.7 mg/dL (ref 0.50–1.10)
HCT: 41 % (ref 36.0–46.0)
Hemoglobin: 13.9 g/dL (ref 12.0–15.0)
Sodium: 141 mEq/L (ref 135–145)
TCO2: 29 mmol/L (ref 0–100)

## 2012-11-06 MED ORDER — IBUPROFEN 600 MG PO TABS: 600.0000 mg | ORAL_TABLET | Freq: Four times a day (QID) | ORAL | Status: DC | PRN

## 2012-11-06 MED ORDER — CYCLOBENZAPRINE HCL 10 MG PO TABS: 10.0000 mg | ORAL_TABLET | Freq: Two times a day (BID) | ORAL | Status: DC | PRN

## 2012-11-06 MED ORDER — HYDROCODONE-ACETAMINOPHEN 5-325 MG PO TABS
2.0000 | ORAL_TABLET | ORAL | Status: DC | PRN
Start: 2012-11-06 — End: 2012-12-01

## 2012-11-06 NOTE — ED Provider Notes (Signed)
History     CSN: [id number]  Arrival date & time 11/06/12  1007   First MD Initiated Contact with Patient 11/06/12 1124      Chief Complaint  Patient presents with  . Chest Pain  . Shortness of Breath     The history is provided by the patient. The history is limited by a language barrier.   Patient states she is having chest pain and sob, she points to the left side. Patient speaks only spanish, using interpretor for info. Patient denies recent travel. Denies taking birth control  Past Medical History  Diagnosis Date  . Eating disorder   . Bulimia nervosa     History reviewed. No pertinent past surgical history.  No family history on file.  History  Substance Use Topics  . Smoking status: Never Smoker   . Smokeless tobacco: Not on file  . Alcohol Use: No    OB History    Grav Para Term Preterm Abortions TAB SAB Ect Mult Living                  Review of Systems  Unable to perform ROS: Other    Allergies  Review of patient's allergies indicates no known allergies.  Home Medications   Current Outpatient Rx  Name  Route  Sig  Dispense  Refill  . PHENAZOPYRIDINE HCL 200 MG PO TABS   Oral   Take 200 mg by mouth 3 (three) times daily as needed. pain         . CYCLOBENZAPRINE HCL 10 MG PO TABS   Oral   Take 1 tablet (10 mg total) by mouth 2 (two) times daily as needed for muscle spasms.   20 tablet   0   . HYDROCODONE-ACETAMINOPHEN 5-325 MG PO TABS   Oral   Take 2 tablets by mouth every 4 (four) hours as needed for pain.   10 tablet   0   . IBUPROFEN 600 MG PO TABS   Oral   Take 1 tablet (600 mg total) by mouth every 6 (six) hours as needed for pain.   30 tablet   0     BP 91/66  Pulse 77  Temp 97.5 F (36.4 C) (Oral)  Resp 20  Wt 100 lb (45.36 kg)  SpO2 100%  LMP 10/30/2012  Physical Exam  Nursing note and vitals reviewed. Constitutional: She is oriented to person, place, and time. She appears well-developed and well-nourished. No  distress.  HENT:  Head: Normocephalic and atraumatic.  Eyes: Pupils are equal, round, and reactive to light.  Neck: Normal range of motion.  Cardiovascular: Normal rate and intact distal pulses.   Pulmonary/Chest: No respiratory distress.  Abdominal: Normal appearance. She exhibits no distension.  Musculoskeletal: Normal range of motion.  Neurological: She is alert and oriented to person, place, and time. No cranial nerve deficit.  Skin: Skin is warm and dry. No rash noted.  Psychiatric: She has a normal mood and affect. Her behavior is normal.    ED Course  Procedures (including critical care time)  Date: 11/06/2012  Rate: 87  Rhythm: normal sinus rhythm  QRS Axis: normal  Intervals: normal  ST/T Wave abnormalities: normal  Conduction Disutrbances: none  Narrative Interpretation: unremarkable     Labs Reviewed  POCT I-STAT, CHEM 8 - Abnormal; Notable for the following:    BUN 5 (*)     Calcium, Ion 1.29 (*)     All other components within normal limits  POCT  I-STAT TROPONIN I  D-DIMER, QUANTITATIVE  LAB REPORT - SCANNED   No results found.   1. Chest wall pain       MDM  Low liklihood ACS or PE.  Patient stable for discharge        Nelia Shi, MD 11/08/12 1328

## 2012-11-06 NOTE — ED Notes (Signed)
Patient states she is having chest pain and sob, she points to the left side.  Patient speaks only spanish, using interpretor for info.  Patient denies recent travel.  Denies taking birth control

## 2012-12-01 ENCOUNTER — Encounter (HOSPITAL_COMMUNITY): Payer: Self-pay | Admitting: Nurse Practitioner

## 2012-12-01 ENCOUNTER — Emergency Department (HOSPITAL_COMMUNITY)
Admission: EM | Admit: 2012-12-01 | Discharge: 2012-12-01 | Disposition: A | Discharge status: Home / Self Care | Payer: Self-pay | Attending: Emergency Medicine | Admitting: Emergency Medicine

## 2012-12-01 DIAGNOSIS — N898 Other specified noninflammatory disorders of vagina: Secondary | ICD-10-CM | POA: Insufficient documentation

## 2012-12-01 DIAGNOSIS — M545 Low back pain, unspecified: Secondary | ICD-10-CM | POA: Insufficient documentation

## 2012-12-01 DIAGNOSIS — R300 Dysuria: Secondary | ICD-10-CM | POA: Insufficient documentation

## 2012-12-01 DIAGNOSIS — Z8659 Personal history of other mental and behavioral disorders: Secondary | ICD-10-CM | POA: Insufficient documentation

## 2012-12-01 DIAGNOSIS — E876 Hypokalemia: Secondary | ICD-10-CM | POA: Insufficient documentation

## 2012-12-01 DIAGNOSIS — Z3202 Encounter for pregnancy test, result negative: Secondary | ICD-10-CM | POA: Insufficient documentation

## 2012-12-01 LAB — URINALYSIS, ROUTINE W REFLEX MICROSCOPIC
Bilirubin Urine: NEGATIVE
Hgb urine dipstick: NEGATIVE
Specific Gravity, Urine: 1.007 (ref 1.005–1.030)
Urobilinogen, UA: 0.2 mg/dL (ref 0.0–1.0)

## 2012-12-01 LAB — WET PREP, GENITAL
Clue Cells Wet Prep HPF POC: NONE SEEN
Trich, Wet Prep: NONE SEEN

## 2012-12-01 LAB — URINE MICROSCOPIC-ADD ON

## 2012-12-01 LAB — POCT I-STAT, CHEM 8
Creatinine, Ser: 0.9 mg/dL (ref 0.50–1.10)
Glucose, Bld: 87 mg/dL (ref 70–99)
Hemoglobin: 13.9 g/dL (ref 12.0–15.0)
Potassium: 2.7 mEq/L — CL (ref 3.5–5.1)

## 2012-12-01 MED ORDER — AZITHROMYCIN 250 MG PO TABS
1000.0000 mg | ORAL_TABLET | Freq: Once | ORAL | Status: AC
  Administered 2012-12-01: 1000 mg via ORAL
  Filled 2012-12-01: qty 4

## 2012-12-01 MED ORDER — CEFTRIAXONE SODIUM 250 MG IJ SOLR
250.0000 mg | Freq: Once | INTRAMUSCULAR | Status: AC
  Administered 2012-12-01: 250 mg via INTRAMUSCULAR
  Filled 2012-12-01: qty 250

## 2012-12-01 MED ORDER — POTASSIUM CHLORIDE CRYS ER 20 MEQ PO TBCR
40.0000 meq | EXTENDED_RELEASE_TABLET | Freq: Once | ORAL | Status: AC
  Administered 2012-12-01: 40 meq via ORAL
  Filled 2012-12-01: qty 2

## 2012-12-01 MED ORDER — LIDOCAINE HCL (PF) 1 % IJ SOLN
INTRAMUSCULAR | Status: AC
  Administered 2012-12-01: 1.5 mL
  Filled 2012-12-01: qty 5

## 2012-12-01 MED ORDER — HYDROCODONE-ACETAMINOPHEN 5-325 MG PO TABS
1.0000 | ORAL_TABLET | Freq: Once | ORAL | Status: AC
  Administered 2012-12-01: 1 via ORAL
  Filled 2012-12-01: qty 1

## 2012-12-01 MED ORDER — PHENAZOPYRIDINE HCL 200 MG PO TABS: 200.0000 mg | ORAL_TABLET | Freq: Three times a day (TID) | ORAL | Status: DC

## 2012-12-01 NOTE — ED Provider Notes (Signed)
History     CSN: [id number]  Arrival date & time 12/01/12  0945   First MD Initiated Contact with Patient 12/01/12 0957      Chief Complaint  Patient presents with  . Urinary Tract Infection    (Consider location/radiation/quality/duration/timing/severity/associated sxs/prior treatment) HPI Comments: Pt presents to the ED for dysuria and subjective fever x 2 days.  States she has the sensation to urinate but is not able to do so and it is painful.  No blood noted in the urine. Also admits to some lower back pain, increasing in severity over the past 2 weeks.  Has tried OTC pain relievers without relief.  No hx of kidney stones.  Prior UTI's requiring abx.  BM are not normal- chronic constipation.  Denies any chest pain, SOB, nausea, vomiting, or abdominal pain.  Hx of anorexia and bulimia.  No new sexual partners.  Patient is a 27 y.o. female presenting with urinary tract infection. The history is provided by the patient. The history is limited by a language barrier. A language interpreter was used.  Urinary Tract Infection    Past Medical History  Diagnosis Date  . Eating disorder   . Bulimia nervosa     History reviewed. No pertinent past surgical history.  History reviewed. No pertinent family history.  History  Substance Use Topics  . Smoking status: Never Smoker   . Smokeless tobacco: Not on file  . Alcohol Use: No    OB History   Grav Para Term Preterm Abortions TAB SAB Ect Mult Living                  Review of Systems  Genitourinary: Positive for dysuria.  Musculoskeletal: Positive for back pain.  All other systems reviewed and are negative.    Allergies  Review of patient's allergies indicates no known allergies.  Home Medications   Current Outpatient Rx  Name  Route  Sig  Dispense  Refill  . cyclobenzaprine (FLEXERIL) 10 MG tablet   Oral   Take 1 tablet (10 mg total) by mouth 2 (two) times daily as needed for muscle spasms.   20 tablet   0   .  HYDROcodone-acetaminophen (NORCO/VICODIN) 5-325 MG per tablet   Oral   Take 2 tablets by mouth every 4 (four) hours as needed for pain.   10 tablet   0   . ibuprofen (ADVIL,MOTRIN) 600 MG tablet   Oral   Take 1 tablet (600 mg total) by mouth every 6 (six) hours as needed for pain.   30 tablet   0   . phenazopyridine (PYRIDIUM) 200 MG tablet   Oral   Take 200 mg by mouth 3 (three) times daily as needed. pain           BP 113/77  Pulse 81  Temp(Src) 98.4 F (36.9 C) (Oral)  Resp 15  SpO2 100%  LMP 10/30/2012  Physical Exam  Nursing note and vitals reviewed. Constitutional: She is oriented to person, place, and time. Vital signs are normal.  Very thin  HENT:  Head: Normocephalic and atraumatic.  Mouth/Throat: Oropharynx is clear and moist.  Eyes: Conjunctivae and EOM are normal.  Neck: Normal range of motion.  Cardiovascular: Normal rate, regular rhythm and normal heart sounds.   Pulmonary/Chest: Effort normal and breath sounds normal.  Abdominal: Soft. Normal appearance and bowel sounds are normal. There is no tenderness. There is CVA tenderness (R > L). There is no tenderness at McBurney's point and negative  Murphy's sign.  Genitourinary: There is no lesion on the right labia. There is no lesion on the left labia. Cervix exhibits no motion tenderness. Right adnexum displays no tenderness. Left adnexum displays tenderness (mild). Vaginal discharge (copious, purulent) found.  Musculoskeletal: Normal range of motion.  Neurological: She is alert and oriented to person, place, and time.  CN grossly intact  Skin: Skin is warm and dry.  Psychiatric: She has a normal mood and affect.    ED Course  Procedures (including critical care time)  Labs Reviewed  WET PREP, GENITAL - Abnormal; Notable for the following:    WBC, Wet Prep HPF POC FEW (*)    All other components within normal limits  URINALYSIS, ROUTINE W REFLEX MICROSCOPIC - Abnormal; Notable for the following:     Leukocytes, UA TRACE (*)    All other components within normal limits  POCT I-STAT, CHEM 8 - Abnormal; Notable for the following:    Sodium 133 (*)    Potassium 2.7 (*)    Chloride 91 (*)    BUN 5 (*)    All other components within normal limits  GC/CHLAMYDIA PROBE AMP  URINE CULTURE  URINE MICROSCOPIC-ADD ON  POCT PREGNANCY, URINE   No results found.   1. Dysuria   2. Hypokalemia       MDM   27 y.o. Female presenting to ED with dysuria and low back pain x 2 weeks.  Pt has hx of anorexia and bulmia so checked chem8- electrolyte abnormalities are consistent with prior dx.  Very low K+ at 2.7- PO 40mg  replacement in ED.  Urine pregnancy negative.  U/A and wet prep largely normal.  On pelvic exam there was copious purulent vaginal discharge present- concern for PID.  Treated with rocephin and azithromycin in ED prior to d/c.  GC/CHL and urine culture pending.  Given rx for pyridium to use as needed for symptomatic relief.  Will be notified in the event cultures are abnormal.  Return to the ED for new or worsening symptoms.        Garlon Hatchet, PA-C 12/01/12 [phone]

## 2012-12-01 NOTE — ED Notes (Signed)
C/o lower back pain, dysuria, chills for 2 weeks

## 2012-12-02 LAB — URINE CULTURE: Colony Count: 45000

## 2012-12-03 LAB — GC/CHLAMYDIA PROBE AMP: GC Probe RNA: NEGATIVE

## 2012-12-04 NOTE — ED Provider Notes (Signed)
Medical screening examination/treatment/procedure(s) were performed by non-physician practitioner and as supervising physician I was immediately available for consultation/collaboration.   Laray Anger, DO 12/04/12 1318

## 2013-01-07 ENCOUNTER — Encounter (HOSPITAL_COMMUNITY): Payer: Self-pay | Admitting: *Deleted

## 2013-01-07 ENCOUNTER — Emergency Department (HOSPITAL_COMMUNITY)
Admission: EM | Admit: 2013-01-07 | Discharge: 2013-01-07 | Disposition: A | Discharge status: Home / Self Care | Payer: Self-pay | Attending: Emergency Medicine | Admitting: Emergency Medicine

## 2013-01-07 DIAGNOSIS — R509 Fever, unspecified: Secondary | ICD-10-CM | POA: Insufficient documentation

## 2013-01-07 DIAGNOSIS — M545 Low back pain, unspecified: Secondary | ICD-10-CM | POA: Insufficient documentation

## 2013-01-07 DIAGNOSIS — R350 Frequency of micturition: Secondary | ICD-10-CM | POA: Insufficient documentation

## 2013-01-07 DIAGNOSIS — Z3202 Encounter for pregnancy test, result negative: Secondary | ICD-10-CM | POA: Insufficient documentation

## 2013-01-07 DIAGNOSIS — Z8659 Personal history of other mental and behavioral disorders: Secondary | ICD-10-CM | POA: Insufficient documentation

## 2013-01-07 DIAGNOSIS — R300 Dysuria: Secondary | ICD-10-CM | POA: Insufficient documentation

## 2013-01-07 DIAGNOSIS — R1110 Vomiting, unspecified: Secondary | ICD-10-CM | POA: Insufficient documentation

## 2013-01-07 LAB — URINALYSIS, ROUTINE W REFLEX MICROSCOPIC
Bilirubin Urine: NEGATIVE
Glucose, UA: NEGATIVE mg/dL
Hgb urine dipstick: NEGATIVE
Ketones, ur: NEGATIVE mg/dL
Specific Gravity, Urine: 1.008 (ref 1.005–1.030)
pH: 8 (ref 5.0–8.0)

## 2013-01-07 LAB — POCT I-STAT, CHEM 8
BUN: <3 mg/dL — ABNORMAL LOW (ref 6–23)
Calcium, Ion: 1.2 mmol/L (ref 1.12–1.23)
Chloride: 95 mEq/L — ABNORMAL LOW (ref 96–112)
Creatinine, Ser: 0.8 mg/dL (ref 0.50–1.10)
Glucose, Bld: 88 mg/dL (ref 70–99)
TCO2: 31 mmol/L (ref 0–100)

## 2013-01-07 LAB — POCT PREGNANCY, URINE: Preg Test, Ur: NEGATIVE

## 2013-01-07 MED ORDER — DIAZEPAM 5 MG PO TABS
5.0000 mg | ORAL_TABLET | Freq: Once | ORAL | Status: AC
  Administered 2013-01-07: 5 mg via ORAL
  Filled 2013-01-07: qty 1

## 2013-01-07 MED ORDER — CYCLOBENZAPRINE HCL 10 MG PO TABS: 10.0000 mg | ORAL_TABLET | Freq: Two times a day (BID) | ORAL | Status: DC | PRN

## 2013-01-07 MED ORDER — IBUPROFEN 400 MG PO TABS
400.0000 mg | ORAL_TABLET | Freq: Once | ORAL | Status: AC
  Administered 2013-01-07: 400 mg via ORAL
  Filled 2013-01-07: qty 1

## 2013-01-07 NOTE — ED Notes (Signed)
To ED for eval of lower back pain starting a month ago. No injury noted. C/O a burning pain when urination.

## 2013-01-07 NOTE — ED Provider Notes (Signed)
History     CSN: [id number]  Arrival date & time 01/07/13  0734   First MD Initiated Contact with Patient 01/07/13 [phone]      Chief Complaint  Patient presents with  . Back Pain    (Consider location/radiation/quality/duration/timing/severity/associated sxs/prior treatment) HPI  [name] is a 27 y.o. female complaining of low back pain x1 month described as moderate, 4/10, exacerbated by movement, associated dysuria and frequency. Patient endorses fever, nonbloody nonbilious emesis. Approximately 3 episodes over the course of the last month. She denies chest pain, abdominal pain.    Interpreter phone used  Past Medical History  Diagnosis Date  . Eating disorder     History reviewed. No pertinent past surgical history.  Family History  Problem Relation Age of Onset  . Heart disease Mother     History  Substance Use Topics  . Smoking status: Never Smoker   . Smokeless tobacco: Not on file  . Alcohol Use: No    OB History   Grav Para Term Preterm Abortions TAB SAB Ect Mult Living                  Review of Systems  Constitutional: Negative for fever.  Respiratory: Negative for shortness of breath.   Cardiovascular: Negative for chest pain.  Gastrointestinal: Negative for nausea, vomiting, abdominal pain and diarrhea.  Genitourinary: Positive for dysuria.  Musculoskeletal: Positive for back pain.  All other systems reviewed and are negative.    Allergies  Review of patient's allergies indicates no known allergies.  Home Medications  No current outpatient prescriptions on file.  BP 107/73  Pulse 88  Temp(Src) 97.9 F (36.6 C)  Resp 16  SpO2 100%  LMP 12/03/2012  Physical Exam  Nursing note and vitals reviewed. Constitutional: She is oriented to person, place, and time. She appears well-developed and well-nourished. No distress.  HENT:  Head: Normocephalic.  Eyes: Conjunctivae and EOM are normal.  Neck: Normal range of motion. Neck supple.    Cardiovascular: Normal rate, regular rhythm and intact distal pulses.   Pulmonary/Chest: Effort normal. No stridor.  Abdominal: Soft. Bowel sounds are normal.  Genitourinary:  No CVA  Musculoskeletal: Normal range of motion.       Back:  Lower lumbar paraspinal musculature spasm with tenderness to palpation  Neurological: She is alert and oriented to person, place, and time.  Psychiatric: She has a normal mood and affect.    ED Course  Procedures (including critical care time)  Labs Reviewed  POCT I-STAT, CHEM 8 - Abnormal; Notable for the following:    Sodium 133 (*)    Chloride 95 (*)    BUN <3 (*)    All other components within normal limits  URINALYSIS, ROUTINE W REFLEX MICROSCOPIC  POCT PREGNANCY, URINE   No results found.   1. Low back pain       MDM   [name] is a 27 y.o. female with low back pain and urinary symptoms. Urinalysis is not consistent with infection. Patient is requesting to check her potassium.    Filed Vitals:   01/07/13 0754  BP: 107/73  Pulse: 88  Temp: 97.9 F (36.6 C)  Resp: 16  SpO2: 100%     Pt verbalized understanding and agrees with care plan. Outpatient follow-up and return precautions given.    New Prescriptions   CYCLOBENZAPRINE (FLEXERIL) 10 MG TABLET    Take 1 tablet (10 mg total) by mouth 2 (two) times daily as needed  for muscle spasms.          Wynetta Emery, PA-C 01/07/13 1657

## 2013-01-07 NOTE — ED Notes (Signed)
Ambulatory without difficulty. No numbness noted.

## 2013-01-16 NOTE — ED Provider Notes (Signed)
Medical screening examination/treatment/procedure(s) were performed by non-physician practitioner and as supervising physician I was immediately available for consultation/collaboration.   Dione Booze, MD 01/16/13 1104

## 2013-03-16 ENCOUNTER — Emergency Department (HOSPITAL_COMMUNITY): Payer: Self-pay

## 2013-03-16 ENCOUNTER — Encounter (HOSPITAL_COMMUNITY): Payer: Self-pay | Admitting: *Deleted

## 2013-03-16 DIAGNOSIS — Z8659 Personal history of other mental and behavioral disorders: Secondary | ICD-10-CM | POA: Insufficient documentation

## 2013-03-16 DIAGNOSIS — F502 Bulimia nervosa, unspecified: Secondary | ICD-10-CM | POA: Insufficient documentation

## 2013-03-16 DIAGNOSIS — E873 Alkalosis: Secondary | ICD-10-CM | POA: Insufficient documentation

## 2013-03-16 DIAGNOSIS — R0789 Other chest pain: Secondary | ICD-10-CM | POA: Insufficient documentation

## 2013-03-16 DIAGNOSIS — E871 Hypo-osmolality and hyponatremia: Secondary | ICD-10-CM | POA: Insufficient documentation

## 2013-03-16 DIAGNOSIS — E876 Hypokalemia: Secondary | ICD-10-CM | POA: Insufficient documentation

## 2013-03-16 NOTE — ED Notes (Signed)
Hurting on lt. Side of chest; lt. Arm hurting and sob. Onset: x 3 days.  Did not take nothing for pain.  Pt. Under some stress. Yes n/v.

## 2013-03-16 NOTE — ED Notes (Signed)
Pt transported to x-ray. Will draw blood when she returns.

## 2013-03-16 NOTE — ED Notes (Signed)
Call interpreter -  Spainish

## 2013-03-17 ENCOUNTER — Emergency Department (HOSPITAL_COMMUNITY)
Admission: EM | Admit: 2013-03-17 | Discharge: 2013-03-17 | Disposition: A | Discharge status: Home / Self Care | Payer: Self-pay | Attending: Emergency Medicine | Admitting: Emergency Medicine

## 2013-03-17 DIAGNOSIS — E873 Alkalosis: Secondary | ICD-10-CM

## 2013-03-17 DIAGNOSIS — R0789 Other chest pain: Secondary | ICD-10-CM

## 2013-03-17 DIAGNOSIS — E871 Hypo-osmolality and hyponatremia: Secondary | ICD-10-CM

## 2013-03-17 DIAGNOSIS — E876 Hypokalemia: Secondary | ICD-10-CM

## 2013-03-17 DIAGNOSIS — F502 Bulimia nervosa: Secondary | ICD-10-CM

## 2013-03-17 LAB — BASIC METABOLIC PANEL
CO2: 37 mEq/L — ABNORMAL HIGH (ref 19–32)
Chloride: 81 mEq/L — ABNORMAL LOW (ref 96–112)
Glucose, Bld: 90 mg/dL (ref 70–99)
Potassium: <2 mEq/L — CL (ref 3.5–5.1)
Sodium: 127 mEq/L — ABNORMAL LOW (ref 135–145)

## 2013-03-17 LAB — CBC
Hemoglobin: 13.3 g/dL (ref 12.0–15.0)
RBC: 4.37 MIL/uL (ref 3.87–5.11)

## 2013-03-17 MED ORDER — POTASSIUM CHLORIDE 10 MEQ/100ML IV SOLN
10.0000 meq | INTRAVENOUS | Status: AC
  Administered 2013-03-17 (×3): 10 meq via INTRAVENOUS
  Filled 2013-03-17: qty 200
  Filled 2013-03-17: qty 100

## 2013-03-17 MED ORDER — SODIUM CHLORIDE 0.9 % IV BOLUS (SEPSIS)
1000.0000 mL | Freq: Once | INTRAVENOUS | Status: AC
  Administered 2013-03-17: 1000 mL via INTRAVENOUS

## 2013-03-17 MED ORDER — PANTOPRAZOLE SODIUM 20 MG PO TBEC: 40.0000 mg | DELAYED_RELEASE_TABLET | Freq: Every day | ORAL | Status: DC

## 2013-03-17 MED ORDER — ONDANSETRON 4 MG PO TBDP: 4.0000 mg | ORAL_TABLET | Freq: Three times a day (TID) | ORAL | Status: DC | PRN

## 2013-03-17 MED ORDER — POTASSIUM CHLORIDE CRYS ER 20 MEQ PO TBCR: 40.0000 meq | EXTENDED_RELEASE_TABLET | Freq: Two times a day (BID) | ORAL | Status: DC

## 2013-03-17 MED ORDER — SODIUM CHLORIDE 0.9 % IJ SOLN: 10.0000 mL | INTRAMUSCULAR | Status: DC | PRN

## 2013-03-17 MED ORDER — GI COCKTAIL ~~LOC~~
30.0000 mL | Freq: Once | ORAL | Status: AC
  Administered 2013-03-17: 30 mL via ORAL
  Filled 2013-03-17: qty 30

## 2013-03-17 MED ORDER — POTASSIUM CHLORIDE CRYS ER 20 MEQ PO TBCR
40.0000 meq | EXTENDED_RELEASE_TABLET | Freq: Once | ORAL | Status: AC
  Administered 2013-03-17: 40 meq via ORAL
  Filled 2013-03-17: qty 2

## 2013-03-17 MED ORDER — PANTOPRAZOLE SODIUM 40 MG IV SOLR
40.0000 mg | Freq: Once | INTRAVENOUS | Status: AC
  Administered 2013-03-17: 40 mg via INTRAVENOUS
  Filled 2013-03-17: qty 40

## 2013-03-17 NOTE — ED Notes (Signed)
PT with Low BP, Otter MD informed. Fluid bolus ordered

## 2013-03-17 NOTE — ED Provider Notes (Signed)
History     CSN: [id number]  Arrival date & time 03/16/13  2108   First MD Initiated Contact with Patient 03/17/13 0224      Chief Complaint  Patient presents with  . Chest Pain    (Consider location/radiation/quality/duration/timing/severity/associated sxs/prior treatment) HPI 27 yo female presents to the ER with complaint of left chest and arm pain x 3 days.  Pain is burning in nature, constant.  No worse with movement or palpation.  Pt has had similar pains in the past.  No family history of CAD.  Pt has significant history of bulimia, reports she vomits 2-3 times a day.  Pt reports she drinks shakes and eats bread.  She reports she is currently seeing a doctor and a therapist.  No fevers, no chills, no cough.  Pt is not a smoker.    Translation done through phone interpreter. Past Medical History  Diagnosis Date  . Eating disorder   . Bulimia nervosa     History reviewed. No pertinent past surgical history.  No family history on file.  History  Substance Use Topics  . Smoking status: Never Smoker   . Smokeless tobacco: Not on file  . Alcohol Use: No    OB History   Grav Para Term Preterm Abortions TAB SAB Ect Mult Living                  Review of Systems  Unable to perform ROS: Other  limited due to foreign language  Allergies  Review of patient's allergies indicates no known allergies.  Home Medications   Current Outpatient Rx  Name  Route  Sig  Dispense  Refill  . ondansetron (ZOFRAN ODT) 4 MG disintegrating tablet   Oral   Take 1 tablet (4 mg total) by mouth every 8 (eight) hours as needed for nausea.   20 tablet   0   . pantoprazole (PROTONIX) 20 MG tablet   Oral   Take 2 tablets (40 mg total) by mouth daily.   30 tablet   0   . potassium chloride SA (K-DUR,KLOR-CON) 20 MEQ tablet   Oral   Take 2 tablets (40 mEq total) by mouth 2 (two) times daily.   30 tablet   0     BP 99/70  Pulse 71  Temp(Src) 98 F (36.7 C) (Oral)  Resp 18   SpO2 100%  LMP 02/14/2013  Physical Exam  Nursing note and vitals reviewed. Constitutional: She is oriented to person, place, and time. She appears well-developed and well-nourished.  Thin but not emaciated  HENT:  Head: Normocephalic and atraumatic.  Right Ear: External ear normal.  Left Ear: External ear normal.  Nose: Nose normal.  Mouth/Throat: Oropharynx is clear and moist.  No signs of enamel wearing  Eyes: Conjunctivae and EOM are normal. Pupils are equal, round, and reactive to light.  Neck: Normal range of motion. Neck supple. No JVD present. No tracheal deviation present. No thyromegaly present.  Cardiovascular: Normal rate, regular rhythm, normal heart sounds and intact distal pulses.  Exam reveals no gallop and no friction rub.   No murmur heard. Pulmonary/Chest: Effort normal and breath sounds normal. No stridor. No respiratory distress. She has no wheezes. She has no rales. She exhibits no tenderness.  Abdominal: Soft. Bowel sounds are normal. She exhibits no distension and no mass. There is no tenderness. There is no rebound and no guarding.  Musculoskeletal: Normal range of motion. She exhibits no edema and no tenderness.  Lymphadenopathy:  She has no cervical adenopathy.  Neurological: She is alert and oriented to person, place, and time. She has normal reflexes. No cranial nerve deficit. She exhibits normal muscle tone. Coordination normal.  Skin: Skin is warm and dry. No rash noted. No erythema. No pallor.  Psychiatric: She has a normal mood and affect. Her behavior is normal. Judgment and thought content normal.  Slightly inappropriate affect, smiling and being shy when asked about her bulimia    ED Course  Procedures (including critical care time)  Labs Reviewed  CBC - Abnormal; Notable for the following:    HCT 35.2 (*)    MCHC 37.8 (*)    Platelets 419 (*)    All other components within normal limits  BASIC METABOLIC PANEL - Abnormal; Notable for the  following:    Sodium 127 (*)    Potassium <2.0 (*)    Chloride 81 (*)    CO2 37 (*)    All other components within normal limits   Dg Chest 2 View  03/16/2013   *RADIOLOGY REPORT*  Clinical Data: Chest pain  CHEST - 2 VIEW  Comparison: 11/06/2012  Findings: Lungs clear.  Heart size and pulmonary vascularity normal.  No effusion.  Visualized bones unremarkable.  IMPRESSION: No acute disease   Original Report Authenticated By: D. Andria Rhein, MD     1. Atypical chest pain   2. Bulimia nervosa, purging type   3. Hypokalemia   4. Hypochloremic alkalosis   5. Hyponatremia       MDM  27 year old female with complaint of chest pain, and left arm pain.  Patient admits that she is an active bulimic.  Labs to reflect this.  Her workup does not show a specific cause for chest pain, but suspect it may be due to gastritis/esophagitis.  Patient has been stable here in the emergency department.  She had some transitory low blood pressures upon discharge, but is otherwise doing fine.  She has been strongly advised through the translator to stop vomiting.  Potassium has been replaced.  She reports that she has a doctor and a therapist that she is working with.        Olivia Mackie, MD 03/18/13 [phone]

## 2013-03-17 NOTE — ED Notes (Signed)
NAD noted at time of d/c home 

## 2013-04-12 ENCOUNTER — Inpatient Hospital Stay (HOSPITAL_COMMUNITY)
Admission: EM | Admit: 2013-04-12 | Discharge: 2013-04-14 | DRG: 887 | Disposition: A | Discharge status: Home / Self Care | Payer: No Typology Code available for payment source | Attending: Family Medicine | Admitting: Family Medicine

## 2013-04-12 ENCOUNTER — Encounter (HOSPITAL_COMMUNITY): Payer: Self-pay | Admitting: Emergency Medicine

## 2013-04-12 DIAGNOSIS — N912 Amenorrhea, unspecified: Secondary | ICD-10-CM

## 2013-04-12 DIAGNOSIS — R636 Underweight: Secondary | ICD-10-CM

## 2013-04-12 DIAGNOSIS — E872 Acidosis, unspecified: Secondary | ICD-10-CM | POA: Diagnosis present

## 2013-04-12 DIAGNOSIS — E8729 Other acidosis: Secondary | ICD-10-CM | POA: Diagnosis present

## 2013-04-12 DIAGNOSIS — D649 Anemia, unspecified: Secondary | ICD-10-CM | POA: Diagnosis present

## 2013-04-12 DIAGNOSIS — F509 Eating disorder, unspecified: Secondary | ICD-10-CM

## 2013-04-12 DIAGNOSIS — E43 Unspecified severe protein-calorie malnutrition: Secondary | ICD-10-CM

## 2013-04-12 DIAGNOSIS — R9431 Abnormal electrocardiogram [ECG] [EKG]: Secondary | ICD-10-CM

## 2013-04-12 DIAGNOSIS — R55 Syncope and collapse: Secondary | ICD-10-CM

## 2013-04-12 DIAGNOSIS — F32A Depression, unspecified: Secondary | ICD-10-CM

## 2013-04-12 DIAGNOSIS — F411 Generalized anxiety disorder: Secondary | ICD-10-CM | POA: Diagnosis present

## 2013-04-12 DIAGNOSIS — R300 Dysuria: Secondary | ICD-10-CM | POA: Diagnosis present

## 2013-04-12 DIAGNOSIS — N911 Secondary amenorrhea: Secondary | ICD-10-CM

## 2013-04-12 DIAGNOSIS — G4700 Insomnia, unspecified: Secondary | ICD-10-CM

## 2013-04-12 DIAGNOSIS — E876 Hypokalemia: Secondary | ICD-10-CM

## 2013-04-12 DIAGNOSIS — F3289 Other specified depressive episodes: Secondary | ICD-10-CM

## 2013-04-12 DIAGNOSIS — T730XXA Starvation, initial encounter: Secondary | ICD-10-CM

## 2013-04-12 DIAGNOSIS — E860 Dehydration: Secondary | ICD-10-CM

## 2013-04-12 DIAGNOSIS — K219 Gastro-esophageal reflux disease without esophagitis: Secondary | ICD-10-CM | POA: Diagnosis present

## 2013-04-12 DIAGNOSIS — F329 Major depressive disorder, single episode, unspecified: Secondary | ICD-10-CM

## 2013-04-12 DIAGNOSIS — F502 Bulimia nervosa: Secondary | ICD-10-CM | POA: Diagnosis present

## 2013-04-12 DIAGNOSIS — R1013 Epigastric pain: Secondary | ICD-10-CM | POA: Diagnosis present

## 2013-04-12 LAB — URINALYSIS, ROUTINE W REFLEX MICROSCOPIC
Bilirubin Urine: NEGATIVE
Leukocytes, UA: NEGATIVE
Nitrite: NEGATIVE
Specific Gravity, Urine: 1.007 (ref 1.005–1.030)
Urobilinogen, UA: 0.2 mg/dL (ref 0.0–1.0)
pH: 7.5 (ref 5.0–8.0)

## 2013-04-12 LAB — COMPREHENSIVE METABOLIC PANEL
ALT: 11 U/L (ref 0–35)
BUN: 5 mg/dL — ABNORMAL LOW (ref 6–23)
CO2: 24 mEq/L (ref 19–32)
Calcium: 10.6 mg/dL — ABNORMAL HIGH (ref 8.4–10.5)
Creatinine, Ser: 0.9 mg/dL (ref 0.50–1.10)
GFR calc Af Amer: >90 mL/min (ref >90)
GFR calc non Af Amer: 86 mL/min — ABNORMAL LOW (ref >90)
Glucose, Bld: 101 mg/dL — ABNORMAL HIGH (ref 70–99)
Sodium: 137 mEq/L (ref 135–145)
Total Protein: 8.8 g/dL — ABNORMAL HIGH (ref 6.0–8.3)

## 2013-04-12 LAB — BASIC METABOLIC PANEL
BUN: 4 mg/dL — ABNORMAL LOW (ref 6–23)
CO2: 22 mEq/L (ref 19–32)
Calcium: 8.8 mg/dL (ref 8.4–10.5)
Glucose, Bld: 109 mg/dL — ABNORMAL HIGH (ref 70–99)
Sodium: 140 mEq/L (ref 135–145)

## 2013-04-12 LAB — RAPID URINE DRUG SCREEN, HOSP PERFORMED
Amphetamines: NOT DETECTED
Tetrahydrocannabinol: NOT DETECTED

## 2013-04-12 LAB — GLUCOSE, CAPILLARY: Glucose-Capillary: 82 mg/dL (ref 70–99)

## 2013-04-12 LAB — CBC
HCT: 38.7 % (ref 36.0–46.0)
Hemoglobin: 14.4 g/dL (ref 12.0–15.0)
MCH: 30.9 pg (ref 26.0–34.0)
MCHC: 37.2 g/dL — ABNORMAL HIGH (ref 30.0–36.0)
MCV: 83 fL (ref 78.0–100.0)
RBC: 4.66 MIL/uL (ref 3.87–5.11)

## 2013-04-12 LAB — TROPONIN I: Troponin I: <0.3 ng/mL (ref <0.30)

## 2013-04-12 LAB — MAGNESIUM: Magnesium: 2.1 mg/dL (ref 1.5–2.5)

## 2013-04-12 MED ORDER — MORPHINE SULFATE 4 MG/ML IJ SOLN
2.0000 mg | Freq: Once | INTRAMUSCULAR | Status: AC
  Administered 2013-04-12: 2 mg via INTRAVENOUS
  Filled 2013-04-12: qty 1

## 2013-04-12 MED ORDER — SODIUM CHLORIDE 0.9 % IJ SOLN: 3.0000 mL | Freq: Two times a day (BID) | INTRAMUSCULAR | Status: DC

## 2013-04-12 MED ORDER — FOLIC ACID 1 MG PO TABS
1.0000 mg | ORAL_TABLET | Freq: Every day | ORAL | Status: DC
  Administered 2013-04-12 – 2013-04-14 (×3): 1 mg via ORAL
  Filled 2013-04-12 (×3): qty 1

## 2013-04-12 MED ORDER — VITAMIN B-1 100 MG PO TABS
100.0000 mg | ORAL_TABLET | Freq: Every day | ORAL | Status: DC
  Administered 2013-04-12 – 2013-04-14 (×3): 100 mg via ORAL
  Filled 2013-04-12 (×3): qty 1

## 2013-04-12 MED ORDER — SODIUM CHLORIDE 0.9 % IV BOLUS (SEPSIS)
1000.0000 mL | Freq: Once | INTRAVENOUS | Status: AC
  Administered 2013-04-12: 1000 mL via INTRAVENOUS

## 2013-04-12 MED ORDER — POTASSIUM CHLORIDE 10 MEQ/100ML IV SOLN
10.0000 meq | INTRAVENOUS | Status: AC
  Administered 2013-04-12 (×2): 10 meq via INTRAVENOUS
  Filled 2013-04-12 (×2): qty 100

## 2013-04-12 MED ORDER — POTASSIUM CHLORIDE IN NACL 20-0.9 MEQ/L-% IV SOLN
INTRAVENOUS | Status: DC
  Filled 2013-04-12 (×3): qty 1000

## 2013-04-12 MED ORDER — POTASSIUM CHLORIDE CRYS ER 20 MEQ PO TBCR
40.0000 meq | EXTENDED_RELEASE_TABLET | Freq: Once | ORAL | Status: AC
  Administered 2013-04-12: 40 meq via ORAL
  Filled 2013-04-12: qty 2

## 2013-04-12 MED ORDER — ACETAMINOPHEN 325 MG PO TABS
650.0000 mg | ORAL_TABLET | Freq: Four times a day (QID) | ORAL | Status: DC | PRN
  Administered 2013-04-12: 650 mg via ORAL
  Filled 2013-04-12: qty 2

## 2013-04-12 MED ORDER — ENSURE COMPLETE PO LIQD
237.0000 mL | Freq: Three times a day (TID) | ORAL | Status: DC
  Administered 2013-04-12 – 2013-04-14 (×6): 237 mL via ORAL

## 2013-04-12 MED ORDER — ACETAMINOPHEN 650 MG RE SUPP: 650.0000 mg | Freq: Four times a day (QID) | RECTAL | Status: DC | PRN

## 2013-04-12 MED ORDER — ENOXAPARIN SODIUM 40 MG/0.4ML ~~LOC~~ SOLN
40.0000 mg | SUBCUTANEOUS | Status: DC
  Administered 2013-04-12 – 2013-04-13 (×2): 40 mg via SUBCUTANEOUS
  Filled 2013-04-12 (×3): qty 0.4

## 2013-04-12 MED ORDER — ADULT MULTIVITAMIN W/MINERALS CH
1.0000 | ORAL_TABLET | Freq: Every day | ORAL | Status: DC
  Administered 2013-04-12 – 2013-04-14 (×3): 1 via ORAL
  Filled 2013-04-12 (×3): qty 1

## 2013-04-12 MED ORDER — GI COCKTAIL ~~LOC~~
30.0000 mL | Freq: Once | ORAL | Status: AC
  Administered 2013-04-12: 30 mL via ORAL
  Filled 2013-04-12: qty 30

## 2013-04-12 MED ORDER — KCL IN DEXTROSE-NACL 20-5-0.45 MEQ/L-%-% IV SOLN
INTRAVENOUS | Status: DC
  Administered 2013-04-12 – 2013-04-13 (×5): via INTRAVENOUS
  Filled 2013-04-12 (×9): qty 1000

## 2013-04-12 MED ORDER — MAGNESIUM SULFATE 40 MG/ML IJ SOLN
1.0000 g | Freq: Once | INTRAMUSCULAR | Status: AC
  Administered 2013-04-12: 1 g via INTRAVENOUS
  Filled 2013-04-12: qty 50

## 2013-04-12 MED ORDER — SENNA 8.6 MG PO TABS
1.0000 | ORAL_TABLET | Freq: Two times a day (BID) | ORAL | Status: DC
  Administered 2013-04-12 – 2013-04-14 (×5): 8.6 mg via ORAL
  Filled 2013-04-12 (×8): qty 1

## 2013-04-12 NOTE — ED Provider Notes (Signed)
History    CSN: [id number] Arrival date & time 04/12/13  4098  First MD Initiated Contact with Patient 04/12/13 [phone]     Chief Complaint  Patient presents with  . Loss of Consciousness   (Consider location/radiation/quality/duration/timing/severity/associated sxs/prior Treatment) HPI [name] is a 27 y.o. female who presents to ED with complaint of weakness, chest pain, insomnia. PT states chest pain has been constant for "months." States diagnosed with gastritis. Reports she is taking  A "gastritis" medicine for it. Pt states she has been under stress. Has not slept in about 3 days. States has bulimia, and is vomiting after every meal. States no URI symptoms, no diarrhea. No urinary symptoms. States she is weak, has had 3 syncopal episodes last night.    Past Medical History  Diagnosis Date  . Eating disorder    History reviewed. No pertinent past surgical history. Family History  Problem Relation Age of Onset  . Heart disease Mother    History  Substance Use Topics  . Smoking status: Never Smoker   . Smokeless tobacco: Not on file  . Alcohol Use: No   OB History   Grav Para Term Preterm Abortions TAB SAB Ect Mult Living                 Review of Systems  Constitutional: Positive for fatigue. Negative for fever and chills.  HENT: Negative for neck pain and neck stiffness.   Respiratory: Positive for chest tightness and shortness of breath.   Cardiovascular: Positive for chest pain.  Gastrointestinal: Positive for nausea and vomiting. Negative for abdominal pain and diarrhea.  Genitourinary: Negative for dysuria and flank pain.  Musculoskeletal: Negative.   Skin: Negative.   Neurological: Positive for dizziness, syncope, weakness, light-headedness and headaches.    Allergies  Review of patient's allergies indicates no known allergies.  Home Medications   Current Outpatient Rx  Name  Route  Sig  Dispense  Refill  . cyclobenzaprine (FLEXERIL) 10 MG tablet    Oral   Take 1 tablet (10 mg total) by mouth 2 (two) times daily as needed for muscle spasms.   20 tablet   0    BP 110/67  Pulse 91  Resp 17  SpO2 100% Physical Exam  Nursing note and vitals reviewed. Constitutional: She is oriented to person, place, and time. She appears well-developed and well-nourished. No distress.  HENT:  Head: Normocephalic.  Eyes: Conjunctivae are normal.  Neck: Neck supple.  Cardiovascular: Normal rate, regular rhythm and normal heart sounds.   Pulmonary/Chest: Effort normal and breath sounds normal. No respiratory distress. She has no wheezes. She has no rales.  Abdominal: Soft. Bowel sounds are normal. She exhibits no distension. There is no tenderness. There is no rebound.  Musculoskeletal: She exhibits no edema.  Neurological: She is alert and oriented to person, place, and time.  Skin: Skin is warm and dry. There is pallor.    ED Course  Procedures (including critical care time) Medications  potassium chloride 10 mEq in 100 mL IVPB (10 mEq Intravenous New Bag/Given 04/12/13 0723)  magnesium sulfate IVPB 1 g 25 mL (not administered)  sodium chloride 0.9 % bolus 1,000 mL (0 mLs Intravenous Stopped 04/12/13 0652)  potassium chloride SA (K-DUR,KLOR-CON) CR tablet 40 mEq (40 mEq Oral Given 04/12/13 0713)  morphine 4 MG/ML injection 2 mg (2 mg Intravenous Given 04/12/13 0714)  sodium chloride 0.9 % bolus 1,000 mL (1,000 mLs Intravenous New Bag/Given 04/12/13 0655)   Results for  orders placed during the hospital encounter of 04/12/13  GLUCOSE, CAPILLARY      Result Value Range   Glucose-Capillary 82  70 - 99 mg/dL   Comment 1 Notify RN    CBC      Result Value Range   WBC 10.3  4.0 - 10.5 K/uL   RBC 4.66  3.87 - 5.11 MIL/uL   Hemoglobin 14.4  12.0 - 15.0 g/dL   HCT 16.1  09.6 - 04.5 %   MCV 83.0  78.0 - 100.0 fL   MCH 30.9  26.0 - 34.0 pg   MCHC 37.2 (*) 30.0 - 36.0 g/dL   RDW 40.9  81.1 - 91.4 %   Platelets 415 (*) 150 - 400 K/uL  COMPREHENSIVE  METABOLIC PANEL      Result Value Range   Sodium 137  135 - 145 mEq/L   Potassium 2.8 (*) 3.5 - 5.1 mEq/L   Chloride 95 (*) 96 - 112 mEq/L   CO2 24  19 - 32 mEq/L   Glucose, Bld 101 (*) 70 - 99 mg/dL   BUN 5 (*) 6 - 23 mg/dL   Creatinine, Ser 7.82  0.50 - 1.10 mg/dL   Calcium 95.6 (*) 8.4 - 10.5 mg/dL   Total Protein 8.8 (*) 6.0 - 8.3 g/dL   Albumin 5.1  3.5 - 5.2 g/dL   AST 20  0 - 37 U/L   ALT 11  0 - 35 U/L   Alkaline Phosphatase 75  39 - 117 U/L   Total Bilirubin 0.5  0.3 - 1.2 mg/dL   GFR calc non Af Amer 86 (*) >90 mL/min   GFR calc Af Amer >90  >90 mL/min  URINALYSIS, ROUTINE W REFLEX MICROSCOPIC      Result Value Range   Color, Urine YELLOW  YELLOW   APPearance CLEAR  CLEAR   Specific Gravity, Urine 1.007  1.005 - 1.030   pH 7.5  5.0 - 8.0   Glucose, UA NEGATIVE  NEGATIVE mg/dL   Hgb urine dipstick NEGATIVE  NEGATIVE   Bilirubin Urine NEGATIVE  NEGATIVE   Ketones, ur 15 (*) NEGATIVE mg/dL   Protein, ur NEGATIVE  NEGATIVE mg/dL   Urobilinogen, UA 0.2  0.0 - 1.0 mg/dL   Nitrite NEGATIVE  NEGATIVE   Leukocytes, UA NEGATIVE  NEGATIVE  POCT PREGNANCY, URINE      Result Value Range   Preg Test, Ur NEGATIVE  NEGATIVE    Date: 04/12/2013  Rate: 96  Rhythm: normal sinus rhythm  QRS Axis: left  Intervals: QT prolonged  ST/T Wave abnormalities: normal  Conduction Disutrbances:none  Narrative Interpretation:   Old EKG Reviewed: changes noted new QT prolongation     1. Hypokalemia   2. Syncope   3. Dehydration   4. Insomnia     MDM  Pt with nausea, syncopal episodes. Appears pale, dehydrated. IV fluids ordered. Pts potassium 2.8 given PO and 20 IVPB. Magnesium ordered as well. ECG showed new QT prolongation. Will admit for further evaluation.  Filed Vitals:   04/12/13 1333 04/12/13 1337 04/12/13 1338 04/12/13 1609  BP: 97/56 103/59 98/60 94/62   Pulse: 76 85 84 73  Temp: 98.5 F (36.9 C) 98.5 F (36.9 C) 98.5 F (36.9 C)   TempSrc: Oral Oral Oral    Resp: 16 16 18 18   Height:      SpO2: 100% 100% 100% 100%        Lottie Mussel, PA-C 04/12/13 1618

## 2013-04-12 NOTE — Progress Notes (Signed)
Pt c/o CP similar to what she has been having before admission.  States that it began almost 2 hours ago.  MD at [phone] (pager) notified.  Vital signs are stable.  EKG done.  MD will come up to see pt.

## 2013-04-12 NOTE — ED Notes (Signed)
SPOUSE REPORTED MULTIPLE EPISODES OF SYNCOPAL EPISODE YESTERDAY AND THIS MORNING , PT. SOMNOLENT / LETHARGIC AT ARRIVAL . RESPIRATIONS UNLABORED .

## 2013-04-12 NOTE — Progress Notes (Signed)
MD has seen pt and states the CP seems to be musculoskeletal.  EKG showed no changes.  Will give pt Acetaminophen 650mg  for CP.  Will continue to monitor pt.

## 2013-04-12 NOTE — ED Notes (Signed)
Spoke with Pharmacist, Marcelino Duster. Reporting okay to run potassium and magnesium together. They are compatible. This is confirmed with micromedix.

## 2013-04-12 NOTE — Progress Notes (Signed)
Utilization review completed.  

## 2013-04-12 NOTE — Progress Notes (Signed)
  Echocardiogram 2D Echocardiogram has been performed.  Cathie Beams 04/12/2013, 9:56 AM

## 2013-04-12 NOTE — Progress Notes (Signed)
Family medicine assuming care of pt since she has seen Dr. Cristal Ford two times in the past year.  I went and examined the patient, having some reproducible epigastric pain.  Will try GI cocktail.  Kendra First Paulina Fusi, DO of [facility] 04/12/2013, 8:34 PM

## 2013-04-12 NOTE — H&P (Signed)
Date: 04/12/2013               Patient Name:  [name] MRN: [id number]  DOB: [birth year] Age / Sex: 27 y.o., female   PCP: Provider Not In System         Medical Service: Internal Medicine Teaching Service         Attending Physician: Dr. Jonah Blue, DO    First Contact: Dr. Mariea Clonts Pager: [phone]  Second Contact: Dr. Everardo Beals Pager: [phone]       After Hours (After 5p/  First Contact Pager: [phone]  weekends / holidays): Second Contact Pager: [phone]   Chief Complaint: syncope, multiple  History of Present Illness:  The patient is a 27 year old spanish speaking woman who comes in to the hospital for 2 episodes of syncope in the last day or so. Interview is conducted via [employer]. She has PMH of eating disorder, secondary amenorrhea, depression. Her eating disorder started after her second pregnancy and 1 year ago she was down to a weight of 87 pounds and a BMI of 15.42. She states that the last several days she has not been able to eat or drink much food or liquids. She had been feeling more tired and with sensation of light headedness. She states that she has felt like this in the past but is usually able to keep herself from passing out. She denies any new or recent stressors at home. She denies smoking cigarettes or alcohol. She states that she does not do any drugs and takes a multivitamin everyday. She does take an antidepressant and a stomach medication at home although she does not know their names. She does not use caffeine pills or pills for energy at home. She states that she is having chest pain however this has been going on for years and she takes a stomach medicine which partially helps for that. She is not having shortness of breath. She is having some burning with urination which is new for her and some abdominal pain associated with constipation. She denies headache, fevers at home. She states that she is usually cold. She has not had a cough or cold recently.  She has not traveled recently. [place] from [place] in 2005 and they have not returned since that time. She is currently having some pain in her left arm and left leg. She is unsure if she fell on that side when she fainted. She does not have any known other medical problems or heart problems. There is no history of early heart disease that she knows of in her family.   Meds: Current Facility-Administered Medications  Medication Dose Route Frequency Provider Last Rate Last Dose  . 0.9 % NaCl with KCl 20 mEq/ L  infusion   Intravenous Continuous Judie Bonus, MD      . acetaminophen (TYLENOL) tablet 650 mg  650 mg Oral Q6H PRN Judie Bonus, MD       Or  . acetaminophen (TYLENOL) suppository 650 mg  650 mg Rectal Q6H PRN Judie Bonus, MD      . enoxaparin (LOVENOX) injection 40 mg  40 mg Subcutaneous Q24H Judie Bonus, MD      . folic acid (FOLVITE) tablet 1 mg  1 mg Oral Daily Judie Bonus, MD      . multivitamin with minerals tablet 1 tablet  1 tablet Oral Daily Judie Bonus, MD      . Gwyn[facility]) tablet  8.6 mg  1 tablet Oral BID Judie Bonus, MD      . sodium chloride 0.9 % injection 3 mL  3 mL Intravenous Q12H Judie Bonus, MD      . thiamine (VITAMIN B-1) tablet 100 mg  100 mg Oral Daily Judie Bonus, MD        Allergies: Allergies as of 04/12/2013  . (No Known Allergies)   Past Medical History  Diagnosis Date  . Eating disorder    History reviewed. No pertinent past surgical history. Family History  Problem Relation Age of Onset  . Heart disease Mother    History   Social History  . Marital Status: Married    Spouse Name: N/A    Number of Children: N/A  . Years of Education: N/A   Occupational History  . Not on file.   Social History Main Topics  . Smoking status: Never Smoker   . Smokeless tobacco: Not on file  . Alcohol Use: No  . Drug Use: No  . Sexually Active: Not on file   Other Topics Concern    . Not on file   Social History Narrative   Lives in [place] since 2005 with husband and 2 children (2011, 2008). Moved from [place] in 2005. Spanish speaking.     Review of Systems: A limited ROS was performed given language barrier and only those items discussed in the HPI were performed.  Physical Exam: Blood pressure 108/72, pulse 92, resp. rate 15, SpO2 100.00%. General: resting in bed, skinny, pale HEENT: PERRL, EOMI, no scleral icterus Cardiac: S1 S2 normal, no S3 or S4 appreciated Pulm: clear to auscultation bilaterally, moving normal volumes of air Abd: soft, mildly tender suprapubic, no flank tenderness, nondistended, BS present but slow, no guarding or rebound Ext: warm and well perfused, no pedal edema Neuro: alert and oriented X3, cranial nerves II-XII grossly intact   Lab results: Basic Metabolic Panel:  Recent Labs  16/10/96 0530  NA 137  K 2.8*  CL 95*  CO2 24  GLUCOSE 101*  BUN 5*  CREATININE 0.90  CALCIUM 10.6*  MG 2.1   Liver Function Tests:  Recent Labs  04/12/13 0530  AST 20  ALT 11  ALKPHOS 75  BILITOT 0.5  PROT 8.8*  ALBUMIN 5.1   CBC:  Recent Labs  04/12/13 0530  WBC 10.3  HGB 14.4  HCT 38.7  MCV 83.0  PLT 415*   CBG:  Recent Labs  04/12/13 0526  GLUCAP 82   Urinalysis:  Recent Labs  04/12/13 0548  COLORURINE YELLOW  LABSPEC 1.007  PHURINE 7.5  GLUCOSEU NEGATIVE  HGBUR NEGATIVE  BILIRUBINUR NEGATIVE  KETONESUR 15*  PROTEINUR NEGATIVE  UROBILINOGEN 0.2  NITRITE NEGATIVE  LEUKOCYTESUR NEGATIVE   Other results: EKG: unchanged from previous tracings, normal sinus rhythm, prolonged QT interval, QT duration 408 and QTc 516.  Assessment & Plan by Problem:  Syncope - The patient is drastically underweight and has not been taking in any nutrition and getting minimal sleep in the last several days. This likely induced her syncope however given her chronic malnutrition she could have usage of stimulants for  energy that she does not admit to and will check ECHO for overall heart function. Will cycle CE to rule out ischemia although no T wave changes. Could also be worsened with electrolyte abnormalities. -Observe on telemetry -2D echo with contrast -Fluid resuscitation with D5 1/2 NS w KCl 20 mEq @ 150 cc/hr -Correct electrolyte  inbalances -Watch QT interval -Cycle CE  Prolonged QT interval - could be related to likely SSRI usage in the setting of decreased body weight. She may need to change therapies or dosages.  -Hold QT prolonging agents -Observe on telemetry -EKG q 6 hours until QT interval normalized  Eating disorder - Likely the underlying cause of most of her medical problems and sounds as though she has been through therapy for this in the past. Can consider psychiatry consult in the future.  -Encourage fluid intake at the minimum  Starvation ketoacidosis - AG 18 and likely cause is starvation ketoacidosis. Patient does not admit to any ingestion and family coorborates that story. Denies alcohol usage entirely and no suspicion for ethylene glycol, methanol, salicylates, uremia.  -D5 1/2 NS with KcL 20 mEq @ 150 cc/hr -Check phosphorus level and replete as needed  Hypokalemia - Repletion in the ED with 40 mEq oral and 2 runs of 10 mEq IV potassium. Will continue to replete with fluids overnight and recheck in the AM. She was also given 1 g magnesium in the ED and check magnesium level given likely additional deficiency of magnesium given malnutrition.   Protein-calorie malnutrition, severe - See above for full details and will encourage intake of food and liquids. Will obtain daily weights and BMI low on last measure. -D5 1/2 NS with KCl 20 mEq @ 150 cc/hr  Dispo: Disposition is deferred at this time, awaiting improvement of current medical problems. Anticipated discharge in approximately 1-2 day(s).   The patient does have a current PCP (Provider Not In System) and does not need an  [facility] hospital follow-up appointment after discharge.  The patient does not have transportation limitations that hinder transportation to clinic appointments.  Signed: Judie Bonus, MD 04/12/2013, 10:18 AM

## 2013-04-13 DIAGNOSIS — R636 Underweight: Secondary | ICD-10-CM

## 2013-04-13 DIAGNOSIS — E860 Dehydration: Secondary | ICD-10-CM

## 2013-04-13 LAB — VITAMIN B12: Vitamin B-12: 1672 pg/mL — ABNORMAL HIGH (ref 211–911)

## 2013-04-13 LAB — IRON AND TIBC: Iron: 82 ug/dL (ref 42–135)

## 2013-04-13 LAB — CBC
HCT: 29.7 % — ABNORMAL LOW (ref 36.0–46.0)
Hemoglobin: 10.5 g/dL — ABNORMAL LOW (ref 12.0–15.0)
MCH: 30.5 pg (ref 26.0–34.0)
MCHC: 35.4 g/dL (ref 30.0–36.0)

## 2013-04-13 LAB — RETICULOCYTES: Retic Count, Absolute: 33.8 10*3/uL (ref 19.0–186.0)

## 2013-04-13 LAB — BASIC METABOLIC PANEL
BUN: 5 mg/dL — ABNORMAL LOW (ref 6–23)
Chloride: 105 mEq/L (ref 96–112)
GFR calc non Af Amer: >90 mL/min (ref >90)
Glucose, Bld: 86 mg/dL (ref 70–99)
Potassium: 4.1 mEq/L (ref 3.5–5.1)

## 2013-04-13 LAB — T4, FREE: Free T4: 1.01 ng/dL (ref 0.80–1.80)

## 2013-04-13 LAB — PHOSPHORUS: Phosphorus: 3.3 mg/dL (ref 2.3–4.6)

## 2013-04-13 LAB — FOLATE: Folate: >20 ng/mL

## 2013-04-13 MED ORDER — PANTOPRAZOLE SODIUM 40 MG PO TBEC
40.0000 mg | DELAYED_RELEASE_TABLET | Freq: Every day | ORAL | Status: DC
  Administered 2013-04-13 – 2013-04-14 (×2): 40 mg via ORAL
  Filled 2013-04-13 (×2): qty 1

## 2013-04-13 MED ORDER — POLYETHYLENE GLYCOL 3350 17 G PO PACK
17.0000 g | PACK | Freq: Every day | ORAL | Status: DC | PRN
  Administered 2013-04-13: 17 g via ORAL
  Filled 2013-04-13: qty 1

## 2013-04-13 MED ORDER — BUSPIRONE HCL 5 MG PO TABS
5.0000 mg | ORAL_TABLET | Freq: Two times a day (BID) | ORAL | Status: DC
  Administered 2013-04-13 – 2013-04-14 (×3): 5 mg via ORAL
  Filled 2013-04-13 (×4): qty 1

## 2013-04-13 NOTE — Progress Notes (Signed)
Family Medicine Teaching Service Daily Progress Note Intern Pager: [phone]  Patient name: ILANA PREZIOSO Medical record number: [id number] Date of birth: 08/21/1986 Age: 27 y.o. Gender: female  Primary Care Provider: Provider Not In System Consultants: none Code Status: presumed full  Pt Overview and Major Events to Date:  27 yo woman with h/o eating disorder presenting with epigastric pain and syncope.  Assessment and Plan: 27 yo F with h/o eating disorder presenting with syncope and epigastric pain.  # Syncope: Likely related to poor PO intake the last several days and chronic malnutrition.  Concern for cardiac arrhythmia given degree of malnutrition, electrolyte abnormalities, and prolonged QTc on admission EKG.  Troponins have been negative x3 and repeat EKGs show normalized QTc. - continue to monitor on telemetry - replete electrolytes as needed - will stop q6hr EKGs  # Epigastric pain: Chronic problem for patient. - troponins negative x3 - s/p GI cocktail which improved pain - will start a PPI for likely GERD  # Severe protein calorie malnutrition: Likely a consequence of eating disorder. - regular diet  - will monitor for refeeding syndrome with daily bmp, mag, and phos levels - consider pysch consult - will decrease IVF [ ]  f/u PO intake  # Eating disorder, NOS: Seeing a therapist in town. - will start buspar 5mg  BID to help with anxiety  # Normocytic anemia: Significant drop in all cell lines from admission, suspect dilutional.  Hemoglobin was normal in 2013. - suspect related to malnutrition - continue to monitor [ ]  f/u anemia panel  # Elevated TSH: May be related to acute illness. - free T4 pending  # Left arm and leg numbness: Not currently present.  No swelling or erythema on exam. - will check B12 and folate  FEN/GI: regular diet; D5 1/2NS +20KCl at 150cc/hr PPx: lovenox  Disposition: pending PO intake  Subjective: Reports some improvement today.  Has  some lower abdominal pain, feels like she needs to have a BM.  Continues to have some chest pain, did not get GI cocktail last night.  Reports eating a little yesterday, but it eating makes her anxious and she vomited afterwards.  She is seeing Leeroy Bock, a therapist, for food related issues.  Also has some point tenderness in the left breast.  Reports an intermittent history of numbness, tingling and swelling in the left arm and leg, none currently.  Objective: Temp:  [98.1 F (36.7 C)-98.6 F (37 C)] 98.1 F (36.7 C) (07/12 0558) Pulse Rate:  [63-85] 70 (07/12 0604) Resp:  [16-20] 20 (07/12 0604) BP: (93-103)/(54-63) 93/57 mmHg (07/12 0604) SpO2:  [100 %] 100 % (07/12 0604) Weight:  [101 lb 12.8 oz (46.176 kg)] 101 lb 12.8 oz (46.176 kg) (07/12 0249) Physical Exam: General: very thin, cooperative, NAD Cardiovascular: RRR, no murmurs Respiratory: CTAB, no wheezes or rales Abdomen: +BS, soft, scaphoid, mild tenderness to bilateral lower quandrants Extremities: no edema, no lanugo noted Neuro: grossly intact  Laboratory:  Recent Labs Lab 04/12/13 0530 04/13/13 0530  WBC 10.3 3.9*  HGB 14.4 10.5*  HCT 38.7 29.7*  PLT 415* 296    Recent Labs Lab 04/12/13 0530 04/12/13 1605 04/13/13 0530  NA 137 140 135  K 2.8* 3.5 4.1  CL 95* 107 105  CO2 24 22 24   BUN 5* 4* 5*  CREATININE 0.90 0.73 0.69  CALCIUM 10.6* 8.8 8.5  PROT 8.8*  --   --   BILITOT 0.5  --   --   ALKPHOS 75  --   --  ALT 11  --   --   AST 20  --   --   GLUCOSE 101* 109* 86   Cardiac Panel (last 3 results)  Recent Labs  04/12/13 1055 04/12/13 1605 04/12/13 2250  TROPONINI <0.30 <0.30 <0.30    Imaging/Diagnostic Tests: EKG 7/12: nsr, normal intervals (QTc 454), no ST or TW changes  Phebe Colla, MD 04/13/2013, 8:37 AM PGY-3, [facility] FPTS Intern pager: [phone], text pages welcome

## 2013-04-13 NOTE — Progress Notes (Signed)
FMTS Attending Admission Note: Kendra Rossy,MD I  have seen and examined this patient, reviewed their chart. I have discussed this patient with the resident. I agree with the resident's findings, assessment and care plan.  Due to language barrier I could not obtain adequate history,I reviewed well documented history obtained by the resident via an interpreter.Today she is awake and alert,feels weak,no headache,dizziness or N/V which I was able to understand from my little spanish.  Exam.  Filed Vitals:   04/13/13 0558 04/13/13 0559 04/13/13 0601 04/13/13 0604  BP: 93/58 96/63 94/54  93/57  Pulse: 63 82 67 70  Temp: 98.1 F (36.7 C)     TempSrc: Oral     Resp: 18 18 18 20   Height:      Weight:      SpO2: 100% 100% 100% 100%   Gen: Awake and alert,not in distress. Neuro: Grossly intact. Resp: Air entry equal B/L CV: S1S2 no murmurs. Abd: Benign. Ext: No edema,no tenderness.  A/P: 1. Syncope: Likely related to poor oral intake.     She is back to baseline.     Lab and imaging study reviewed.     Continue hydration.     ECHO reviewed,f/u carotid doppler.     Continue telemetry.  2. Hypotension: Part of malnutrition syndrome.     I agree with hydration.  3. Malnutrition: Nutritionist consult.  4.Prolonged QTc interval.    Last EKG reviewed,now at borderline elevation.    Continue telemetry monitoring.    Hold SSRI. 5. ?? Hypothyroidism with elevated TSH. F/U free T4.

## 2013-04-14 DIAGNOSIS — R55 Syncope and collapse: Secondary | ICD-10-CM

## 2013-04-14 LAB — CBC
HCT: 34.5 % — ABNORMAL LOW (ref 36.0–46.0)
Hemoglobin: 11.9 g/dL — ABNORMAL LOW (ref 12.0–15.0)
RBC: 3.92 MIL/uL (ref 3.87–5.11)
RDW: 13.1 % (ref 11.5–15.5)
WBC: 4.4 10*3/uL (ref 4.0–10.5)

## 2013-04-14 LAB — BASIC METABOLIC PANEL
BUN: 3 mg/dL — ABNORMAL LOW (ref 6–23)
Chloride: 103 mEq/L (ref 96–112)
GFR calc Af Amer: >90 mL/min (ref >90)
Glucose, Bld: 83 mg/dL (ref 70–99)
Potassium: 3.8 mEq/L (ref 3.5–5.1)

## 2013-04-14 MED ORDER — SENNA 8.6 MG PO TABS: 1.0000 | ORAL_TABLET | Freq: Two times a day (BID) | ORAL | Status: DC

## 2013-04-14 MED ORDER — POLYETHYLENE GLYCOL 3350 17 G PO PACK: 17.0000 g | PACK | Freq: Every day | ORAL | Status: DC | PRN

## 2013-04-14 MED ORDER — PANTOPRAZOLE SODIUM 40 MG PO TBEC: 40.0000 mg | DELAYED_RELEASE_TABLET | Freq: Every day | ORAL | Status: DC

## 2013-04-14 NOTE — Progress Notes (Signed)
Family Medicine Teaching Service Daily Progress Note Intern Pager: [phone]  Patient name: [name] Medical record number: [id number] Date of birth: 1986-05-01 Age: 27 y.o. Gender: female  Primary Care Provider: Provider Not In System Consultants: none Code Status: presumed full  Pt Overview and Major Events to Date:  27 yo woman with h/o eating disorder presenting with epigastric pain and syncope.  Assessment and Plan: 27 yo F with h/o eating disorder presenting with syncope and epigastric pain. Currently both resolved.  # Syncope: Likely related to poor PO intake the last several days and chronic malnutrition.  Concern for cardiac arrhythmia given degree of malnutrition, electrolyte abnormalities, and prolonged QTc on admission EKG.  Troponins have been negative x3 and repeat EKGs show normalized QTc. - continue to monitor on telemetry - replete electrolytes as needed  # Epigastric pain: Chronic problem for patient. - troponins negative x3 - s/p GI cocktail which improved pain - will start a PPI for likely GERD  # Severe protein calorie malnutrition: Likely a consequence of eating disorder. - regular diet  - will monitor for refeeding syndrome with daily bmp, mag, and phos levels - consider pysch consult - will decrease IVF [ ]  f/u PO intake  # Eating disorder, NOS: Seeing a therapist in town. - continue buspar 5mg  BID to help with anxiety - will consider inpatient psych consult  # Normocytic anemia: Significant drop in all cell lines from admission, suspect dilutional.  Hemoglobin was normal in 2013. Currently trending up from admission. - suspect related to malnutrition - continue to monitor [ ]  f/u anemia panel  # Elevated TSH: May be related to acute illness. - free T4 normal  # Left arm and leg numbness: Not currently present.  No swelling or erythema on exam. - will check B12 and folate  FEN/GI: regular diet; D5 1/2NS +20KCl at 150cc/hr PPx:  lovenox  Disposition: Home pending possible inpatient psych consult  Subjective: Says she had a bowel movement. Currently has no abdominal pain. Is able to eat 1/3 of her meal. She said if she tries to eat more than that, she starts feeling anxious and has the urge to make herself vomit. She has had no recent nausea or vomiting. After she took Buspar, she said she developed a headache. She would like to go home because she has to work and she has no one to take care of her kids.  Objective: Temp:  [97.2 F (36.2 C)-98.7 F (37.1 C)] 98.3 F (36.8 C) (07/13 0431) Pulse Rate:  [72-80] 72 (07/13 0431) Resp:  [16-18] 18 (07/13 0431) BP: (90-95)/(51-60) 91/51 mmHg (07/13 0431) SpO2:  [99 %-100 %] 99 % (07/13 0431) Weight:  [101 lb 9.6 oz (46.085 kg)] 101 lb 9.6 oz (46.085 kg) (07/13 0431)  Physical Exam: General: very thin, cooperative, NAD Cardiovascular: RRR, no murmurs Respiratory: CTAB, no wheezes or rales Abdomen: +BS, soft, scaphoid, non-tender Extremities: no edema Neuro: grossly intact, alert & oriented x3  Laboratory:  Recent Labs Lab 04/12/13 0530 04/13/13 0530 04/14/13 0450  WBC 10.3 3.9* 4.4  HGB 14.4 10.5* 11.9*  HCT 38.7 29.7* 34.5*  PLT 415* 296 337    Recent Labs Lab 04/12/13 0530 04/12/13 1605 04/13/13 0530 04/14/13 0450  NA 137 140 135 139  K 2.8* 3.5 4.1 3.8  CL 95* 107 105 103  CO2 24 22 24 30   BUN 5* 4* 5* 3*  CREATININE 0.90 0.73 0.69 0.73  CALCIUM 10.6* 8.8 8.5 9.7  PROT 8.8*  --   --   --  BILITOT 0.5  --   --   --   ALKPHOS 75  --   --   --   ALT 11  --   --   --   AST 20  --   --   --   GLUCOSE 101* 109* 86 83   Cardiac Panel (last 3 results)  Recent Labs  04/12/13 1055 04/12/13 1605 04/12/13 2250  TROPONINI <0.30 <0.30 <0.30   Free T4: 1.01  Imaging/Diagnostic Tests: EKG 7/12: nsr, normal intervals (QTc 454), no ST or TW changes  Jacquelin Hawking, MD 04/14/2013, 8:04 AM PGY-1, [place] [facility] FPTS Intern pager:  [phone], text pages welcome

## 2013-04-14 NOTE — Discharge Summary (Signed)
Family Medicine Teaching [facility] Discharge Summary  Patient name: [name] Medical record number: [id number] Date of birth: 04/07/1986 Age: 27 y.o. Gender: female Date of Admission: 04/12/2013  Date of Discharge: 04/14/2013 Admitting Physician: Jonah Blue, DO  Primary Care Provider: Provider Not In System Consultants: None  Indication for Hospitalization: ACS rule out after multiple episodes of syncope  Discharge Diagnoses/Problem List:  1. Syncope 2. Epigastric pain 3. Severe protein calorie malnutrition 4. Eating disorder, NOS 5. Normocytic anemia 6. Elevated TSH  Disposition: Home  Discharge Condition: Stable  Brief Hospital Course: The patient is a 27 year old spanish speaking woman who comes in to the hospital for 2 episodes of syncope in the last day or so. Interview is conducted via [employer]. She has PMH of eating disorder, secondary amenorrhea, depression. Her eating disorder started after her second pregnancy and 1 year ago she was down to a weight of 87 pounds and a BMI of 15.42. She states that the last several days she has not been able to eat or drink much food or liquids. She had been feeling more tired and with sensation of light headedness. She states that she has felt like this in the past but is usually able to keep herself from passing out. She denies any new or recent stressors at home. She denies smoking cigarettes or alcohol. She states that she does not do any drugs and takes a multivitamin everyday. She does take an antidepressant and a stomach medication at home although she does not know their names. She does not use caffeine pills or pills for energy at home. She states that she is having chest pain however this has been going on for years and she takes a stomach medicine which partially helps for that. She is not having shortness of breath. She is having some burning with urination which is new for her and some abdominal pain associated with  constipation. She denies headache, fevers at home. She states that she is usually cold. She has not had a cough or cold recently. She has not traveled recently. [place] in 2005 and they have not returned since that time. She is currently having some pain in her left arm and left leg. She is unsure if she fell on that side when she fainted. She does not have any known other medical problems or heart problems. There is no history of early heart disease that she knows of in her family.   1. Syncope - patient had prolonged QTc on admission EKG which resolved on subsequent EKG. She was placed on telemetry and received an echo, which showed an EF of 50%-55% with no structural abnormalities. On 7/13, she received carotid artery duplex study which showed less than 39% ICA stenosis.  2. Epigastric pain  - troponins were negative x3. She received a GI cocktail on 7/11, which improved her pain. She was started on protonix.    3. Severe protein calorie malnutrition - on 7/11, she was started on Ensure Complete feeding supplement.   4. Eating disorder, NOS - started on Buspar 5mg  BID on 7/12 for anxiety.  5. Normocytic anemia - Hemoglobin on admission was 14.4 but decreased to 10.5 on 7/12, which was most likely caused by dilution. Hemoglobin started trending up to 11.9 on 7/13. She had no symptoms of anemia.  6. Elevated TSH - on admission, TSH was high (8.510). On 7/12, Free T4 was normal at 1.01. No further workup done.  Ms.  [name] asked to leave without a Psychiatry consult because there was no one at home to take care of her children. Her husband had been taking care of them over the weekend, but had to start working the next day. She agreed to schedule an outpatient Psychiatry visit at [facility]. When evaluated, [name] had no suicidal or homicidal ideation, and stressed that she wants to live.  Issues for Follow Up:  1. Eating disorder causing electrolyte   Significant Procedures:    7/13 - Carotid doppler Bilateral carotid artery duplex: Less than 39% ICA stenosis. Vertebral artery flow is antegrade.   Significant Labs and Imaging:   Recent Labs Lab 04/12/13 0530 04/13/13 0530 04/14/13 0450  WBC 10.3 3.9* 4.4  HGB 14.4 10.5* 11.9*  HCT 38.7 29.7* 34.5*  PLT 415* 296 337    Recent Labs Lab 04/12/13 0530 04/12/13 1100 04/12/13 1605 04/13/13 0530 04/14/13 0450  NA 137  --  140 135 139  K 2.8*  --  3.5 4.1 3.8  CL 95*  --  107 105 103  CO2 24  --  22 24 30   GLUCOSE 101*  --  109* 86 83  BUN 5*  --  4* 5* 3*  CREATININE 0.90  --  0.73 0.69 0.73  CALCIUM 10.6*  --  8.8 8.5 9.7  MG 2.1  --   --  2.0 2.1  PHOS  --  2.7  --  3.3 4.7*  ALKPHOS 75  --   --   --   --   AST 20  --   --   --   --   ALT 11  --   --   --   --   ALBUMIN 5.1  --   --   --   --      Outstanding Results: None  Discharge Medications:    Medication List         pantoprazole 40 MG tablet  Commonly known as:  PROTONIX  Take 1 tablet (40 mg total) by mouth daily.     polyethylene glycol packet  Commonly known as:  MIRALAX / GLYCOLAX  Take 17 g by mouth daily as needed.     senna 8.6 MG Tabs  Commonly known as:  SENOKOT  Take 1 tablet (8.6 mg total) by mouth 2 (two) times daily.        Discharge Instructions: Please refer to Patient Instructions section of EMR for full details.  Patient was counseled important signs and symptoms that should prompt return to medical care, changes in medications, dietary instructions, activity restrictions, and follow up appointments.   Follow-Up Appointments:     Follow-up Information   Follow up with [facility]. Schedule an appointment as soon as possible for a visit in 1 week.   Contact information:   [address] [place] [address] [phone]      Follow up with [facility]. Schedule an appointment as soon as possible for a visit in 1 day.   Contact information:   [address] [place] [address] [phone]      Jacquelin Hawking, MD 04/14/2013, 11:50 AM PGY-1, [facility]

## 2013-04-14 NOTE — Progress Notes (Signed)
FMTS Attending Admission Note: Kendra Eniola,MD I  have seen and examined this patient, reviewed their chart. I have discussed this patient with the resident. I agree with the resident's findings, assessment and care plan.  I had a discussion with patient today with the help of an interpreter,she denies any concern,she feels better today,she continues to feel anxious with eating, she was started on Buspar which as per patient does not make her feel good.We as a team recommended psychiatry assessment,but she stated she needed to go home today to care for her children. Her physical exam today was benign,she is not a threat to herself or others,she does not have acute psychiatry condition, I think she will be safe for discharge home to make follow up with the psychologist she was seeing prior to this admission.

## 2013-04-14 NOTE — Progress Notes (Signed)
CSW received call from Dr. Caleb Popp re: follow-up appointment with [facility]. CSW attempted to call [facility], but they apparently do not make appointments over the weekend. RN & MD made aware that patient will have to call in the morning 8-5:00 (ph#: [phone]) to make appointment. Patient to discharge home today. No other CSW needs identified - CSW signing off.   Unice Bailey, LCSW Clinical Social Worker cell #: [phone] (weekend coverage)

## 2013-04-14 NOTE — Progress Notes (Signed)
Bilateral carotid artery duplex:  Less than 39% ICA stenosis.  Vertebral artery flow is antegrade.     

## 2013-04-14 NOTE — Progress Notes (Signed)
Interim Progress Note  I went to speak to the Ms. Coglianese, because she is requesting discharge today. She said that she would like to leave today, without the psych consult, because there will be no one at home to take care of her children tomorrow. She said she sees a therapist outpatient and is amenable to seeing a psychiatrist outpatient but has no insurance.Consult to social work was ordered. Will follow-up. Patient currently has no suicidal or homicidal ideation and "wants to live."  Jacquelin Hawking, MD PGY-1, [facility] 04/14/2013 FPTS Intern pager: [phone]

## 2013-04-14 NOTE — ED Provider Notes (Signed)
Medical screening examination/treatment/procedure(s) were performed by non-physician practitioner and as supervising physician I was immediately available for consultation/collaboration.  Sunnie Nielsen, MD 04/14/13 [phone]

## 2013-04-17 NOTE — H&P (Signed)
Patient seen and admitted by [facility] Medicine Teaching Service. See Family attending admission note.

## 2013-04-17 NOTE — Discharge Summary (Signed)
FMTS Attending Admission Note: Kendra Eniola,MD I  have seen and examined this patient, reviewed their chart. I have discussed this patient with the resident. I agree with the resident's findings, assessment and care plan.  

## 2013-05-04 ENCOUNTER — Encounter (HOSPITAL_COMMUNITY): Payer: Self-pay | Admitting: Emergency Medicine

## 2013-05-04 ENCOUNTER — Emergency Department (HOSPITAL_COMMUNITY)
Admission: EM | Admit: 2013-05-04 | Discharge: 2013-05-04 | Disposition: A | Discharge status: Home / Self Care | Payer: Self-pay | Attending: Emergency Medicine | Admitting: Emergency Medicine

## 2013-05-04 DIAGNOSIS — Z8659 Personal history of other mental and behavioral disorders: Secondary | ICD-10-CM | POA: Insufficient documentation

## 2013-05-04 DIAGNOSIS — R071 Chest pain on breathing: Secondary | ICD-10-CM | POA: Insufficient documentation

## 2013-05-04 DIAGNOSIS — F502 Bulimia nervosa: Secondary | ICD-10-CM

## 2013-05-04 DIAGNOSIS — E876 Hypokalemia: Secondary | ICD-10-CM | POA: Insufficient documentation

## 2013-05-04 DIAGNOSIS — R1110 Vomiting, unspecified: Secondary | ICD-10-CM | POA: Insufficient documentation

## 2013-05-04 DIAGNOSIS — R0789 Other chest pain: Secondary | ICD-10-CM

## 2013-05-04 DIAGNOSIS — R632 Polyphagia: Secondary | ICD-10-CM | POA: Insufficient documentation

## 2013-05-04 LAB — CBC
HCT: 39.2 % (ref 36.0–46.0)
Hemoglobin: 14.4 g/dL (ref 12.0–15.0)
MCH: 30.8 pg (ref 26.0–34.0)
MCV: 83.9 fL (ref 78.0–100.0)
RBC: 4.67 MIL/uL (ref 3.87–5.11)

## 2013-05-04 LAB — BASIC METABOLIC PANEL
CO2: 35 mEq/L — ABNORMAL HIGH (ref 19–32)
Calcium: 10 mg/dL (ref 8.4–10.5)
Creatinine, Ser: 0.8 mg/dL (ref 0.50–1.10)
Glucose, Bld: 93 mg/dL (ref 70–99)
Sodium: 133 mEq/L — ABNORMAL LOW (ref 135–145)

## 2013-05-04 LAB — POCT I-STAT TROPONIN I

## 2013-05-04 MED ORDER — IBUPROFEN 200 MG PO TABS
600.0000 mg | ORAL_TABLET | Freq: Once | ORAL | Status: AC
  Administered 2013-05-04: 600 mg via ORAL
  Filled 2013-05-04: qty 3

## 2013-05-04 MED ORDER — POTASSIUM CHLORIDE CRYS ER 20 MEQ PO TBCR: 20.0000 meq | EXTENDED_RELEASE_TABLET | Freq: Every day | ORAL | Status: DC

## 2013-05-04 MED ORDER — POTASSIUM CHLORIDE 10 MEQ/100ML IV SOLN
10.0000 meq | Freq: Once | INTRAVENOUS | Status: AC
  Administered 2013-05-04: 10 meq via INTRAVENOUS
  Filled 2013-05-04: qty 100

## 2013-05-04 MED ORDER — POTASSIUM CHLORIDE CRYS ER 20 MEQ PO TBCR
40.0000 meq | EXTENDED_RELEASE_TABLET | Freq: Once | ORAL | Status: AC
  Administered 2013-05-04: 40 meq via ORAL
  Filled 2013-05-04: qty 2

## 2013-05-04 NOTE — ED Notes (Signed)
Patient presents to the medical department with CP and a history of the same complaints of dizziness CP worse with deep breath. Pain started 4 days ago.

## 2013-05-04 NOTE — Progress Notes (Signed)
Met patient at bedside.Spanish speaking lady.Spanish interpreter used to translate education and written resources.Case manager role explained.Patient provided with written/ verbal resources  For the [facility] on [address] / [facility] on [address].Patient receptive to resources and reports she has not seen a PCP for years.Patient provided with spanish resource card  For the [address] help line card.Patient provided with a bus pass.No further Case Management needs identified.Patient verbalized understanding of all written resources /  Verbal  Education.

## 2013-05-04 NOTE — ED Provider Notes (Signed)
Medical screening examination/treatment/procedure(s) were performed by non-physician practitioner and as supervising physician I was immediately available for consultation/collaboration.   Charles B. Bernette Mayers, MD 05/04/13 1351

## 2013-05-04 NOTE — ED Notes (Signed)
Patient presents with CP times 4 day and vomiting all week and patient states she has not been able to sleep.

## 2013-05-04 NOTE — ED Provider Notes (Signed)
CSN: [id number]     Arrival date & time 05/04/13  0919 History     First MD Initiated Contact with Patient 05/04/13 [phone]     Chief Complaint  Patient presents with  . Chest Pain   (Consider location/radiation/quality/duration/timing/severity/associated sxs/prior Treatment) HPI Comments: 27 y/o Spanish-speaking female with a past medical history of bulimia presents to the emergency department complaining of chest pain x4 days. Pain located in the left side of her chest, described as sharp, non-radiating, worse with deep inspiration. She has not had any alleviating factors for her symptoms. She has been vomiting daily, however she has been making herself vomit. No abdominal pain or nausea. Denies shortness of breath, cough, fever.   Patient is a 27 y.o. female presenting with chest pain. The history is provided by the patient. The history is limited by a language barrier. A language interpreter was used.  Chest Pain Associated symptoms: vomiting   Associated symptoms: no abdominal pain and no nausea     Past Medical History  Diagnosis Date  . Eating disorder   . Bulimia nervosa    History reviewed. No pertinent past surgical history. History reviewed. No pertinent family history. History  Substance Use Topics  . Smoking status: Never Smoker   . Smokeless tobacco: Not on file  . Alcohol Use: No   OB History   Grav Para Term Preterm Abortions TAB SAB Ect Mult Living                 Review of Systems  Cardiovascular: Positive for chest pain.  Gastrointestinal: Positive for vomiting. Negative for nausea and abdominal pain.  All other systems reviewed and are negative.    Allergies  Review of patient's allergies indicates no known allergies.  Home Medications  No current outpatient prescriptions on file. BP 111/75  Pulse 89  Temp(Src) 98.4 F (36.9 C) (Oral)  Resp 15  SpO2 100%  LMP 04/25/2013 Physical Exam  Nursing note and vitals reviewed. Constitutional: She is  oriented to person, place, and time. She appears well-developed and well-nourished. No distress.  HENT:  Head: Normocephalic and atraumatic.  Mouth/Throat: Oropharynx is clear and moist.  Eyes: Conjunctivae and EOM are normal. Pupils are equal, round, and reactive to light.  Neck: Normal range of motion. Neck supple.  Cardiovascular: Normal rate, regular rhythm and normal heart sounds.   Pulmonary/Chest: Effort normal and breath sounds normal. She has no decreased breath sounds. She has no wheezes. She has no rhonchi. She has no rales. She exhibits tenderness.    Abdominal: Soft. Bowel sounds are normal. There is no tenderness.  Musculoskeletal: Normal range of motion. She exhibits no edema.  Neurological: She is alert and oriented to person, place, and time. She has normal strength. No sensory deficit.  Skin: Skin is warm and dry. She is not diaphoretic.  Psychiatric: She has a normal mood and affect. Her behavior is normal.    ED Course   Procedures (including critical care time)  Labs Reviewed  CBC - Abnormal; Notable for the following:    MCHC 36.7 (*)    All other components within normal limits  BASIC METABOLIC PANEL - Abnormal; Notable for the following:    Sodium 133 (*)    Potassium 2.6 (*)    Chloride 86 (*)    CO2 35 (*)    All other components within normal limits  POTASSIUM - Abnormal; Notable for the following:    Potassium 3.3 (*)    All other components  within normal limits  POCT I-STAT TROPONIN I    Date: 05/04/2013  Rate: 91  Rhythm: normal sinus rhythm  QRS Axis: normal  Intervals: normal  ST/T Wave abnormalities: normal  Conduction Disutrbances:none  Narrative Interpretation: no stemi  Old EKG Reviewed: unchanged   No results found. 1. Hypokalemia   2. Chest wall pain   3. Bulimia     MDM  Patient with reproducible chest pain. Unlikely cardiac/pulmonary. Lungs clear. Labs obtained in triage prior to patient being seen. Hx of hypokalemia due to  bulimia. K today 2.6. Will replace potassium PO and IV, recheck potassium prior to discharge. No EKG changes. 1:48 PM Potassium 3.3 after replacement. She will be discharged home with PO potassium, PCP follow up. States she will call Monday to schedule an appointment with someone she was referred to. Resources also given. Ibuprofen for chest wall pain. Self-induced vomiting highly discouraged. Return precautions discussed. Patient states understanding of plan via interpreter and is agreeable.    Trevor Mace, PA-C 05/04/13 1350

## 2013-05-04 NOTE — ED Notes (Signed)
Potassium critical 2.6 reported to ED PA Mathis Fare

## 2013-05-13 ENCOUNTER — Emergency Department (HOSPITAL_COMMUNITY)
Admission: EM | Admit: 2013-05-13 | Discharge: 2013-05-14 | Disposition: A | Discharge status: Home / Self Care | Payer: Self-pay | Attending: Emergency Medicine | Admitting: Emergency Medicine

## 2013-05-13 ENCOUNTER — Other Ambulatory Visit: Payer: Self-pay

## 2013-05-13 ENCOUNTER — Encounter (HOSPITAL_COMMUNITY): Payer: Self-pay | Admitting: Adult Health

## 2013-05-13 DIAGNOSIS — R079 Chest pain, unspecified: Secondary | ICD-10-CM | POA: Insufficient documentation

## 2013-05-13 DIAGNOSIS — F502 Bulimia nervosa, unspecified: Secondary | ICD-10-CM | POA: Insufficient documentation

## 2013-05-13 DIAGNOSIS — Z3202 Encounter for pregnancy test, result negative: Secondary | ICD-10-CM | POA: Insufficient documentation

## 2013-05-13 LAB — BASIC METABOLIC PANEL
BUN: 6 mg/dL (ref 6–23)
CO2: 28 mEq/L (ref 19–32)
Calcium: 9.5 mg/dL (ref 8.4–10.5)
Chloride: 103 mEq/L (ref 96–112)
GFR calc Af Amer: >90 mL/min (ref >90)
GFR calc non Af Amer: >90 mL/min (ref >90)
Glucose, Bld: 87 mg/dL (ref 70–99)
Potassium: 3.7 mEq/L (ref 3.5–5.1)
Sodium: 140 mEq/L (ref 135–145)

## 2013-05-13 LAB — CBC
MCV: 87.1 fL (ref 78.0–100.0)
Platelets: 360 10*3/uL (ref 150–400)
RBC: 4.12 MIL/uL (ref 3.87–5.11)
WBC: 7.2 10*3/uL (ref 4.0–10.5)

## 2013-05-13 NOTE — ED Notes (Signed)
Presents with cehst pain that began a few days ago worse with deep inspiration and radiation to left arm. Pain is described as sharp. Reports same pain previously and found to have low potassium at that time.

## 2013-05-14 ENCOUNTER — Emergency Department (HOSPITAL_COMMUNITY): Payer: Self-pay

## 2013-05-14 MED ORDER — IBUPROFEN 600 MG PO TABS: 600.0000 mg | ORAL_TABLET | Freq: Four times a day (QID) | ORAL | Status: DC | PRN

## 2013-05-14 MED ORDER — KETOROLAC TROMETHAMINE 60 MG/2ML IM SOLN
60.0000 mg | INTRAMUSCULAR | Status: AC
  Administered 2013-05-14: 60 mg via INTRAMUSCULAR
  Filled 2013-05-14: qty 2

## 2013-05-14 MED ORDER — KETOROLAC TROMETHAMINE 30 MG/ML IJ SOLN: 30.0000 mg | Freq: Once | INTRAMUSCULAR | Status: DC

## 2013-05-14 NOTE — ED Provider Notes (Signed)
CSN: [id number]     Arrival date & time 05/13/13  2016 History     First MD Initiated Contact with Patient 05/13/13 2343     Chief Complaint  Patient presents with  . Chest Pain   (Consider location/radiation/quality/duration/timing/severity/associated sxs/prior Treatment) HPI History provided by patient through Spanish speaking translator. Left-sided chest pain worse with deep inspiration. This has been on off for the last few weeks and the last time patient came to the emergency room she was told that she had low potassium. Patient presents today concerned that her potassium is low again. She has history of bulimia, is followed by psychiatry, and admits to ongoing issues with this. She denies any shortness of breath, fevers, cough, nausea, diaphoresis. This pain is similar to previous episodes. Pain is sharp in quality and moderate in severity. Past Medical History  Diagnosis Date  . Eating disorder   . Bulimia nervosa    History reviewed. No pertinent past surgical history. History reviewed. No pertinent family history. History  Substance Use Topics  . Smoking status: Never Smoker   . Smokeless tobacco: Not on file  . Alcohol Use: No   OB History   Grav Para Term Preterm Abortions TAB SAB Ect Mult Living                 Review of Systems  Constitutional: Negative for fever and chills.  HENT: Negative for neck pain and neck stiffness.   Eyes: Negative for pain.  Respiratory: Negative for shortness of breath.   Cardiovascular: Positive for chest pain.  Gastrointestinal: Negative for abdominal pain.  Genitourinary: Negative for dysuria.  Musculoskeletal: Negative for back pain.  Skin: Negative for rash.  Neurological: Negative for headaches.  All other systems reviewed and are negative.    Allergies  Review of patient's allergies indicates no known allergies.  Home Medications  No current outpatient prescriptions on file. BP 109/78  Pulse 77  Temp(Src) 98.3 F  (36.8 C) (Oral)  Resp 12  SpO2 100%  LMP 04/25/2013 Physical Exam  Constitutional: She is oriented to person, place, and time. She appears well-developed and well-nourished.  HENT:  Head: Normocephalic and atraumatic.  Eyes: EOM are normal. Pupils are equal, round, and reactive to light.  Neck: Neck supple.  Cardiovascular: Normal rate, regular rhythm and intact distal pulses.   Pulmonary/Chest: Effort normal and breath sounds normal. No respiratory distress.  Reproducible left anterior chest wall tenderness without rash. No crepitus.  Musculoskeletal: Normal range of motion. She exhibits no edema.  Neurological: She is alert and oriented to person, place, and time.  Skin: Skin is warm and dry.    ED Course   Procedures (including critical care time)   Date: 05/14/2013  Rate: 89  Rhythm: normal sinus rhythm  QRS Axis: normal  Intervals: normal  ST/T Wave abnormalities: nonspecific ST changes  Conduction Disutrbances:none  Narrative Interpretation:   Old EKG Reviewed: unchanged  Results for orders placed during the hospital encounter of 05/13/13  CBC      Result Value Range   WBC 7.2  4.0 - 10.5 K/uL   RBC 4.12  3.87 - 5.11 MIL/uL   Hemoglobin 12.8  12.0 - 15.0 g/dL   HCT 78.2 (*) 95.6 - 21.3 %   MCV 87.1  78.0 - 100.0 fL   MCH 31.1  26.0 - 34.0 pg   MCHC 35.7  30.0 - 36.0 g/dL   RDW 08.6  57.8 - 46.9 %   Platelets 360  150 -  400 K/uL  BASIC METABOLIC PANEL      Result Value Range   Sodium 140  135 - 145 mEq/L   Potassium 3.7  3.5 - 5.1 mEq/L   Chloride 103  96 - 112 mEq/L   CO2 28  19 - 32 mEq/L   Glucose, Bld 87  70 - 99 mg/dL   BUN 6  6 - 23 mg/dL   Creatinine, Ser 1.61  0.50 - 1.10 mg/dL   Calcium 9.5  8.4 - 09.6 mg/dL   GFR calc non Af Amer >90  >90 mL/min   GFR calc Af Amer >90  >90 mL/min  D-DIMER, QUANTITATIVE      Result Value Range   D-Dimer, Quant <0.27  0.00 - 0.48 ug/mL-FEU  POCT PREGNANCY, URINE      Result Value Range   Preg Test, Ur  NEGATIVE  NEGATIVE   Dg Chest Portable 1 View  05/14/2013   *RADIOLOGY REPORT*  Clinical Data: Chest pain.  PORTABLE CHEST - 1 VIEW  Comparison: Chest x-ray 03/16/2013.  Findings: Prominent nipple shadows. Lung volumes are normal.  No consolidative airspace disease.  No pleural effusions.  No pneumothorax.  No pulmonary nodule or mass noted.  Pulmonary vasculature and the cardiomediastinal silhouette are within normal limits.  IMPRESSION: 1. No radiographic evidence of acute cardiopulmonary disease.   Original Report Authenticated By: Trudie Reed, M.D.    IV Toradol.  Recheck pain improving.   Plan discharge home with prescription provided and an outpatient referral. No hypokalemia on labs reviewed as above today. Patient agrees to all discharge and followup instructions.   MDM  Recurrent chest wall pain EKG. Chest x-ray. Labs. Pain improved with medications Vital Signs and nursing notes reviewed Previous records reviewed   Sunnie Nielsen, MD 05/14/13 [phone]

## 2013-05-15 ENCOUNTER — Emergency Department (HOSPITAL_COMMUNITY): Payer: Self-pay

## 2013-05-15 ENCOUNTER — Encounter (HOSPITAL_COMMUNITY): Payer: Self-pay | Admitting: Emergency Medicine

## 2013-05-15 ENCOUNTER — Emergency Department (HOSPITAL_COMMUNITY)
Admission: EM | Admit: 2013-05-15 | Discharge: 2013-05-15 | Disposition: A | Discharge status: Home / Self Care | Payer: Self-pay | Attending: Emergency Medicine | Admitting: Emergency Medicine

## 2013-05-15 DIAGNOSIS — F509 Eating disorder, unspecified: Secondary | ICD-10-CM | POA: Insufficient documentation

## 2013-05-15 DIAGNOSIS — Z3202 Encounter for pregnancy test, result negative: Secondary | ICD-10-CM | POA: Insufficient documentation

## 2013-05-15 DIAGNOSIS — M79602 Pain in left arm: Secondary | ICD-10-CM | POA: Diagnosis present

## 2013-05-15 DIAGNOSIS — R0602 Shortness of breath: Secondary | ICD-10-CM | POA: Insufficient documentation

## 2013-05-15 DIAGNOSIS — M79609 Pain in unspecified limb: Secondary | ICD-10-CM | POA: Insufficient documentation

## 2013-05-15 DIAGNOSIS — R0789 Other chest pain: Secondary | ICD-10-CM | POA: Insufficient documentation

## 2013-05-15 DIAGNOSIS — Z79899 Other long term (current) drug therapy: Secondary | ICD-10-CM | POA: Insufficient documentation

## 2013-05-15 DIAGNOSIS — R1110 Vomiting, unspecified: Secondary | ICD-10-CM | POA: Insufficient documentation

## 2013-05-15 LAB — CBC WITH DIFFERENTIAL/PLATELET
Hemoglobin: 13.2 g/dL (ref 12.0–15.0)
Lymphocytes Relative: 38 % (ref 12–46)
Lymphs Abs: 1.9 10*3/uL (ref 0.7–4.0)
MCV: 86.9 fL (ref 78.0–100.0)
Monocytes Relative: 8 % (ref 3–12)
Neutrophils Relative %: 53 % (ref 43–77)
Platelets: 376 10*3/uL (ref 150–400)
RBC: 4.28 MIL/uL (ref 3.87–5.11)
WBC: 5 10*3/uL (ref 4.0–10.5)

## 2013-05-15 LAB — COMPREHENSIVE METABOLIC PANEL
ALT: 9 U/L (ref 0–35)
Alkaline Phosphatase: 73 U/L (ref 39–117)
BUN: 8 mg/dL (ref 6–23)
CO2: 31 mEq/L (ref 19–32)
Chloride: 98 mEq/L (ref 96–112)
GFR calc Af Amer: >90 mL/min (ref >90)
GFR calc non Af Amer: 84 mL/min — ABNORMAL LOW (ref >90)
Glucose, Bld: 120 mg/dL — ABNORMAL HIGH (ref 70–99)
Potassium: 3.6 mEq/L (ref 3.5–5.1)
Sodium: 138 mEq/L (ref 135–145)
Total Bilirubin: 0.3 mg/dL (ref 0.3–1.2)

## 2013-05-15 MED ORDER — SODIUM CHLORIDE 0.9 % IV BOLUS (SEPSIS)
1000.0000 mL | INTRAVENOUS | Status: AC
  Administered 2013-05-15: 1000 mL via INTRAVENOUS

## 2013-05-15 MED ORDER — ACETAMINOPHEN 325 MG PO TABS
650.0000 mg | ORAL_TABLET | Freq: Once | ORAL | Status: AC
  Administered 2013-05-15: 650 mg via ORAL
  Filled 2013-05-15: qty 2

## 2013-05-15 MED ORDER — GI COCKTAIL ~~LOC~~
30.0000 mL | Freq: Once | ORAL | Status: AC
  Administered 2013-05-15: 30 mL via ORAL
  Filled 2013-05-15: qty 30

## 2013-05-15 NOTE — ED Notes (Signed)
Left arm pain and cp x 5 days

## 2013-05-15 NOTE — ED Provider Notes (Signed)
CSN: [id number]     Arrival date & time 05/15/13  1314 History     First MD Initiated Contact with Patient 05/15/13 1450     Chief Complaint  Patient presents with  . Arm Pain   (Consider location/radiation/quality/duration/timing/severity/associated sxs/prior Treatment) Patient is a 27 y.o. female presenting with arm pain. The history is provided by the patient.  Arm Pain This is a new problem. Episode onset: 1 week ago. Episode frequency: intermittently. The problem has not changed since onset.Associated symptoms include chest pain and shortness of breath (occasional). Pertinent negatives include no abdominal pain and no headaches. Nothing aggravates the symptoms. Nothing relieves the symptoms. She has tried nothing for the symptoms. The treatment provided no relief.    Past Medical History  Diagnosis Date  . Eating disorder    History reviewed. No pertinent past surgical history. Family History  Problem Relation Age of Onset  . Heart disease Mother    History  Substance Use Topics  . Smoking status: Never Smoker   . Smokeless tobacco: Not on file  . Alcohol Use: No   OB History   Grav Para Term Preterm Abortions TAB SAB Ect Mult Living                 Review of Systems  Constitutional: Negative for fever and fatigue.  HENT: Negative for congestion, drooling and neck pain.   Eyes: Negative for pain.  Respiratory: Positive for shortness of breath (occasional). Negative for cough.   Cardiovascular: Positive for chest pain.  Gastrointestinal: Positive for vomiting (self induced). Negative for nausea, abdominal pain and diarrhea.  Genitourinary: Negative for dysuria and hematuria.  Musculoskeletal: Negative for back pain and gait problem.       Diffuse left arm pain.   Skin: Negative for color change.  Neurological: Negative for dizziness and headaches.  Hematological: Negative for adenopathy.  Psychiatric/Behavioral: Negative for behavioral problems.  All other  systems reviewed and are negative.    Allergies  Review of patient's allergies indicates no known allergies.  Home Medications   Current Outpatient Rx  Name  Route  Sig  Dispense  Refill  . ibuprofen (ADVIL,MOTRIN) 200 MG tablet   Oral   Take 200 mg by mouth every 6 (six) hours as needed for pain.         . potassium chloride (KLOR-CON) 20 MEQ packet   Oral   Take 20 mEq by mouth 2 (two) times daily.          BP 111/83  Pulse 73  Temp(Src) 98.4 F (36.9 C) (Oral)  Resp 16  SpO2 100%  LMP 04/15/2013 Physical Exam  Nursing note and vitals reviewed. Constitutional: She is oriented to person, place, and time. She appears well-developed and well-nourished.  HENT:  Head: Normocephalic.  Mouth/Throat: Oropharynx is clear and moist. No oropharyngeal exudate.  Eyes: Conjunctivae and EOM are normal. Pupils are equal, round, and reactive to light.  Neck: Normal range of motion. Neck supple.  Cardiovascular: Normal rate, regular rhythm, normal heart sounds and intact distal pulses.  Exam reveals no gallop and no friction rub.   No murmur heard. Pulmonary/Chest: Effort normal and breath sounds normal. No respiratory distress. She has no wheezes. She exhibits tenderness (mild ttp of left upper chest).  Abdominal: Soft. Bowel sounds are normal. There is no tenderness. There is no rebound and no guarding.  Musculoskeletal: Normal range of motion. She exhibits no edema and no tenderness.  2+ distal pulses. No focal ttp of  left arm. Mild ecchymosis and abrasion proximal to left antecubital fossa. Otherwise normal appearing UE's w/ normal rom.   Neurological: She is alert and oriented to person, place, and time.  Skin: Skin is warm and dry.  Psychiatric: She has a normal mood and affect. Her behavior is normal.    ED Course   Procedures (including critical care time)  Labs Reviewed  COMPREHENSIVE METABOLIC PANEL - Abnormal; Notable for the following:    Glucose, Bld 120 (*)     GFR calc non Af Amer 84 (*)    All other components within normal limits  CBC WITH DIFFERENTIAL  POCT I-STAT TROPONIN I   Dg Chest 2 View  05/15/2013   *RADIOLOGY REPORT*  Clinical Data: Chest pain  CHEST - 2 VIEW  Comparison: May 11, 2012  Findings:  Lungs clear.  Heart size and pulmonary vascularity are normal.  No adenopathy.  No pneumothorax.  No bone lesions.  IMPRESSION: No abnormality noted.   Original Report Authenticated By: Bretta Bang, M.D.   No diagnosis found.  Date: 05/15/2013  Rate: 87  Rhythm: normal sinus rhythm  QRS Axis: normal  Intervals: normal  ST/T Wave abnormalities: normal  Conduction Disutrbances:none  Narrative Interpretation: No ST or T wave changes consistent with ischemia.   Old EKG Reviewed: unchanged   MDM  3:06 PM 27 y.o. female w hx of bulimia who pw left sided chest and arm pain x 1 week. Intermittent and sharp, sometimes lasting only seconds. Nothing makes worse. Well's/Perc neg. Atypical for cardiac. Possibly assoc w/ induced vomiting as pt has had similar sx in the past. Pt also started prozac 1 week ago, given to her from her psychiatrist, Dr. August Saucer. Will get labs, IVF, tylenol, gi cocktail.    4:53 PM: I interpreted/reviewed the labs and/or imaging which were non-contributory. Pt feeling mildly better. Low risk per Heart score and pain atypical. Suspect sx assoc w/ prozac use vs induced emesis. I have discussed the diagnosis/risks/treatment options with the patient and believe the pt to be eligible for discharge home to follow-up with her psychiatrist to discuss her meds. We also discussed returning to the ED immediately if new or worsening sx occur. We discussed the sx which are most concerning (e.g., worsening cp, sob, fever) that necessitate immediate return. Any new prescriptions provided to the patient are listed below. Will have pt take scheduled tylenol.   New Prescriptions   No medications on file     Junius Argyle,  MD 05/16/13 1023

## 2013-05-15 NOTE — ED Notes (Signed)
Harrison MD at bedside.  

## 2013-05-24 ENCOUNTER — Encounter (HOSPITAL_COMMUNITY): Payer: Self-pay | Admitting: Emergency Medicine

## 2013-05-24 ENCOUNTER — Emergency Department (HOSPITAL_COMMUNITY)
Admission: EM | Admit: 2013-05-24 | Discharge: 2013-05-25 | Disposition: A | Discharge status: Home / Self Care | Payer: Self-pay | Attending: Emergency Medicine | Admitting: Emergency Medicine

## 2013-05-24 DIAGNOSIS — Z3202 Encounter for pregnancy test, result negative: Secondary | ICD-10-CM | POA: Insufficient documentation

## 2013-05-24 DIAGNOSIS — K2900 Acute gastritis without bleeding: Secondary | ICD-10-CM | POA: Insufficient documentation

## 2013-05-24 DIAGNOSIS — F502 Bulimia nervosa, unspecified: Secondary | ICD-10-CM | POA: Insufficient documentation

## 2013-05-24 DIAGNOSIS — R112 Nausea with vomiting, unspecified: Secondary | ICD-10-CM | POA: Insufficient documentation

## 2013-05-24 DIAGNOSIS — E860 Dehydration: Secondary | ICD-10-CM | POA: Insufficient documentation

## 2013-05-24 DIAGNOSIS — K2970 Gastritis, unspecified, without bleeding: Secondary | ICD-10-CM

## 2013-05-24 DIAGNOSIS — M549 Dorsalgia, unspecified: Secondary | ICD-10-CM | POA: Insufficient documentation

## 2013-05-24 DIAGNOSIS — Z79899 Other long term (current) drug therapy: Secondary | ICD-10-CM | POA: Insufficient documentation

## 2013-05-24 DIAGNOSIS — E871 Hypo-osmolality and hyponatremia: Secondary | ICD-10-CM | POA: Insufficient documentation

## 2013-05-24 DIAGNOSIS — E876 Hypokalemia: Secondary | ICD-10-CM | POA: Insufficient documentation

## 2013-05-24 DIAGNOSIS — M79609 Pain in unspecified limb: Secondary | ICD-10-CM | POA: Insufficient documentation

## 2013-05-24 HISTORY — DX: Bulimia nervosa: F50.2

## 2013-05-24 LAB — URINE MICROSCOPIC-ADD ON

## 2013-05-24 LAB — POCT I-STAT, CHEM 8
HCT: 43 % (ref 36.0–46.0)
Hemoglobin: 14.6 g/dL (ref 12.0–15.0)
Potassium: 2.6 mEq/L — CL (ref 3.5–5.1)
Sodium: 124 mEq/L — ABNORMAL LOW (ref 135–145)

## 2013-05-24 LAB — COMPREHENSIVE METABOLIC PANEL
AST: 18 U/L (ref 0–37)
CO2: 35 mEq/L — ABNORMAL HIGH (ref 19–32)
Calcium: 9.8 mg/dL (ref 8.4–10.5)
Creatinine, Ser: 0.85 mg/dL (ref 0.50–1.10)
GFR calc Af Amer: >90 mL/min (ref >90)
GFR calc non Af Amer: >90 mL/min (ref >90)

## 2013-05-24 LAB — CBC WITH DIFFERENTIAL/PLATELET
Basophils Absolute: 0 10*3/uL (ref 0.0–0.1)
Eosinophils Absolute: 0 10*3/uL (ref 0.0–0.7)
Eosinophils Relative: 1 % (ref 0–5)
HCT: 37.2 % (ref 36.0–46.0)
Lymphocytes Relative: 23 % (ref 12–46)
MCH: 31.1 pg (ref 26.0–34.0)
MCHC: 37.9 g/dL — ABNORMAL HIGH (ref 30.0–36.0)
MCV: 82.1 fL (ref 78.0–100.0)
Monocytes Absolute: 0.7 10*3/uL (ref 0.1–1.0)
RDW: 12.1 % (ref 11.5–15.5)
WBC: 8.5 10*3/uL (ref 4.0–10.5)

## 2013-05-24 LAB — POCT PREGNANCY, URINE: Preg Test, Ur: NEGATIVE

## 2013-05-24 LAB — URINALYSIS, ROUTINE W REFLEX MICROSCOPIC
Hgb urine dipstick: NEGATIVE
Nitrite: NEGATIVE
Specific Gravity, Urine: 1.013 (ref 1.005–1.030)
Urobilinogen, UA: 0.2 mg/dL (ref 0.0–1.0)

## 2013-05-24 MED ORDER — ONDANSETRON HCL 4 MG/2ML IJ SOLN
4.0000 mg | Freq: Once | INTRAMUSCULAR | Status: AC
  Administered 2013-05-25: 4 mg via INTRAVENOUS
  Filled 2013-05-24: qty 2

## 2013-05-24 MED ORDER — SODIUM CHLORIDE 0.9 % IV BOLUS (SEPSIS)
1000.0000 mL | Freq: Once | INTRAVENOUS | Status: AC
  Administered 2013-05-24: 1000 mL via INTRAVENOUS

## 2013-05-24 MED ORDER — POTASSIUM CHLORIDE 10 MEQ/100ML IV SOLN
10.0000 meq | Freq: Once | INTRAVENOUS | Status: AC
  Administered 2013-05-24: 10 meq via INTRAVENOUS
  Filled 2013-05-24: qty 100

## 2013-05-24 MED ORDER — ONDANSETRON HCL 4 MG/2ML IJ SOLN
4.0000 mg | Freq: Once | INTRAMUSCULAR | Status: AC
  Administered 2013-05-24: 4 mg via INTRAVENOUS
  Filled 2013-05-24: qty 2

## 2013-05-24 MED ORDER — POTASSIUM CHLORIDE 10 MEQ/100ML IV SOLN
10.0000 meq | INTRAVENOUS | Status: AC
  Administered 2013-05-24 – 2013-05-25 (×2): 10 meq via INTRAVENOUS
  Filled 2013-05-24 (×2): qty 100

## 2013-05-24 MED ORDER — GI COCKTAIL ~~LOC~~
30.0000 mL | Freq: Once | ORAL | Status: AC
  Administered 2013-05-24: 30 mL via ORAL
  Filled 2013-05-24: qty 30

## 2013-05-24 MED ORDER — SODIUM CHLORIDE 0.9 % IV SOLN
80.0000 mg | Freq: Once | INTRAVENOUS | Status: AC
  Administered 2013-05-25: 80 mg via INTRAVENOUS
  Filled 2013-05-24: qty 80

## 2013-05-24 MED ORDER — SUCRALFATE 1 G PO TABS
1.0000 g | ORAL_TABLET | Freq: Once | ORAL | Status: AC
  Administered 2013-05-25: 1 g via ORAL
  Filled 2013-05-24: qty 1

## 2013-05-24 NOTE — ED Provider Notes (Signed)
CSN: [id number]     Arrival date & time 05/24/13  2058 History     First MD Initiated Contact with Patient 05/24/13 2257     Chief Complaint  Patient presents with  . Abdominal Pain   (Consider location/radiation/quality/duration/timing/severity/associated sxs/prior Treatment) HPI 27 yo female presents to the ER from home with complaint of nausea, upper abdominal pain, back pain, and vomiting.  Sxs started yesterday.  She reports vomiting more than 20 times in the last 24 hours.  Pt reports pain is a burning feeling.  No prior h/o same.  Pt seen recently in the ER for arm pain, started on motrin, which she has been taking every 6 hours for the last 3 days.  Pt has history of bulimia, reports she has been doing better with symptoms, sometimes gets down but denies recent induced vomiting.    Past Medical History  Diagnosis Date  . Eating disorder   . Bulimia    History reviewed. No pertinent past surgical history. Family History  Problem Relation Age of Onset  . Heart disease Mother    History  Substance Use Topics  . Smoking status: Never Smoker   . Smokeless tobacco: Not on file  . Alcohol Use: No   OB History   Grav Para Term Preterm Abortions TAB SAB Ect Mult Living                 Review of Systems  All other systems reviewed and are negative.  other than listed in hpi   Allergies  Review of patient's allergies indicates no known allergies.  Home Medications   Current Outpatient Rx  Name  Route  Sig  Dispense  Refill  . citalopram (CELEXA) 10 MG tablet   Oral   Take 10 mg by mouth daily.          BP 110/73  Pulse 76  Temp(Src) 98 F (36.7 C) (Oral)  Resp 18  SpO2 95%  LMP 04/25/2013 Physical Exam  Nursing note and vitals reviewed. Constitutional: She is oriented to person, place, and time. She appears well-developed and well-nourished.  HENT:  Head: Normocephalic and atraumatic.  Nose: Nose normal.  Mouth/Throat: Oropharynx is clear and moist.   Eyes: Conjunctivae and EOM are normal. Pupils are equal, round, and reactive to light.  Neck: Normal range of motion. Neck supple. No JVD present. No tracheal deviation present. No thyromegaly present.  Cardiovascular: Normal rate, regular rhythm, normal heart sounds and intact distal pulses.  Exam reveals no gallop and no friction rub.   No murmur heard. Pulmonary/Chest: Effort normal and breath sounds normal. No stridor. No respiratory distress. She has no wheezes. She has no rales. She exhibits no tenderness.  Abdominal: Soft. Bowel sounds are normal. She exhibits no distension and no mass. There is tenderness (epigastric pain). There is no rebound and no guarding.  Musculoskeletal: Normal range of motion. She exhibits no edema and no tenderness.  Lymphadenopathy:    She has no cervical adenopathy.  Neurological: She is alert and oriented to person, place, and time. She exhibits normal muscle tone. Coordination normal.  Skin: Skin is warm and dry. No rash noted. No erythema. No pallor.  Psychiatric: She has a normal mood and affect. Her behavior is normal. Judgment and thought content normal.    ED Course   Procedures (including critical care time)  Labs Reviewed  CBC WITH DIFFERENTIAL - Abnormal; Notable for the following:    MCHC 37.9 (*)    All other  components within normal limits  COMPREHENSIVE METABOLIC PANEL - Abnormal; Notable for the following:    Sodium 124 (*)    Potassium 2.5 (*)    Chloride 78 (*)    CO2 35 (*)    Glucose, Bld 100 (*)    All other components within normal limits  URINALYSIS, ROUTINE W REFLEX MICROSCOPIC - Abnormal; Notable for the following:    APPearance TURBID (*)    pH 8.5 (*)    Ketones, ur 40 (*)    All other components within normal limits  URINE MICROSCOPIC-ADD ON - Abnormal; Notable for the following:    Squamous Epithelial / LPF FEW (*)    Bacteria, UA FEW (*)    All other components within normal limits  BASIC METABOLIC PANEL -  Abnormal; Notable for the following:    Sodium 130 (*)    Potassium 3.0 (*)    Chloride 93 (*)    BUN 4 (*)    Calcium 8.2 (*)    All other components within normal limits  POCT I-STAT, CHEM 8 - Abnormal; Notable for the following:    Sodium 124 (*)    Potassium 2.6 (*)    Chloride 78 (*)    BUN 5 (*)    Creatinine, Ser 1.20 (*)    Glucose, Bld 105 (*)    Calcium, Ion 1.04 (*)    All other components within normal limits  POCT PREGNANCY, URINE   No results found. 1. Gastritis   2. Nausea and vomiting   3. Dehydration   4. Hyponatremia   5. Hypokalemia     MDM  27 yo female with n/v, abd pain, recent NSAID use, also h/o bulimia.  Pt noted to have hyponatremia, hypokalemia, hypochloremia and increased creatinine.  Will give ivf, K replacement and treat with carafate, gi cocktail and protonix for presumed gastritis.  May require admission if we are not able to control her vomiting.  2:55 AM Pt has tolerated crackers and ginger ale.  Labs are improving.  Will d/c home, instructed to f/u with pcm early next week.  Olivia Mackie, MD 05/25/13 [phone]

## 2013-05-24 NOTE — ED Notes (Signed)
2nd pot  Hung.  1000 ccnss infused .  1000cc added to the current iv

## 2013-05-24 NOTE — ED Notes (Signed)
Patient with abdominal pain described as burning with nausea and vomiting, with back pain.  Patient has vomited 5 times since last night.

## 2013-05-24 NOTE — ED Notes (Signed)
Pt reports nausea that started today with 6 episodes of vomiting. Pt reports she is bulimic but these episodes of vomiting were unprovoked. Pt has also had nausea with decreased appetite.

## 2013-05-25 LAB — BASIC METABOLIC PANEL
BUN: 4 mg/dL — ABNORMAL LOW (ref 6–23)
Chloride: 93 mEq/L — ABNORMAL LOW (ref 96–112)
Creatinine, Ser: 0.66 mg/dL (ref 0.50–1.10)
GFR calc Af Amer: >90 mL/min (ref >90)
GFR calc non Af Amer: >90 mL/min (ref >90)
Potassium: 3 mEq/L — ABNORMAL LOW (ref 3.5–5.1)

## 2013-05-25 MED ORDER — PANTOPRAZOLE SODIUM 20 MG PO TBEC: 40.0000 mg | DELAYED_RELEASE_TABLET | Freq: Every day | ORAL | Status: DC

## 2013-05-25 MED ORDER — SUCRALFATE 1 G PO TABS: 1.0000 g | ORAL_TABLET | Freq: Four times a day (QID) | ORAL | Status: DC

## 2013-05-25 MED ORDER — ONDANSETRON 4 MG PO TBDP: 4.0000 mg | ORAL_TABLET | Freq: Three times a day (TID) | ORAL | Status: DC | PRN

## 2013-05-25 MED ORDER — POTASSIUM CHLORIDE ER 10 MEQ PO TBCR: 20.0000 meq | EXTENDED_RELEASE_TABLET | Freq: Two times a day (BID) | ORAL | Status: DC

## 2013-05-25 NOTE — ED Notes (Signed)
The pt just finished 3 runs of pot 10 meq.  The 3rd one was scanned and it was accepted.  Now it appears that there was no scanning of the pt or the med.  unresolved

## 2013-05-25 NOTE — ED Notes (Signed)
The pt  Has tolerated po fluids well.  Pot run last one almost infused

## 2013-05-25 NOTE — ED Notes (Signed)
The pt  Walked to the br with assistance.  Tolerated ok.  Sprite and crackers given

## 2013-05-25 NOTE — ED Notes (Signed)
Pt  Alert up to the br

## 2013-05-25 NOTE — ED Notes (Signed)
All med given or running

## 2013-06-07 ENCOUNTER — Encounter (HOSPITAL_COMMUNITY): Payer: Self-pay | Admitting: Emergency Medicine

## 2013-06-07 ENCOUNTER — Emergency Department (HOSPITAL_COMMUNITY)
Admission: EM | Admit: 2013-06-07 | Discharge: 2013-06-08 | Disposition: A | Discharge status: Home / Self Care | Payer: No Typology Code available for payment source | Attending: Emergency Medicine | Admitting: Emergency Medicine

## 2013-06-07 DIAGNOSIS — Z3202 Encounter for pregnancy test, result negative: Secondary | ICD-10-CM | POA: Insufficient documentation

## 2013-06-07 DIAGNOSIS — E873 Alkalosis: Secondary | ICD-10-CM | POA: Insufficient documentation

## 2013-06-07 DIAGNOSIS — Z8659 Personal history of other mental and behavioral disorders: Secondary | ICD-10-CM | POA: Insufficient documentation

## 2013-06-07 DIAGNOSIS — E876 Hypokalemia: Secondary | ICD-10-CM

## 2013-06-07 DIAGNOSIS — E860 Dehydration: Secondary | ICD-10-CM | POA: Insufficient documentation

## 2013-06-07 DIAGNOSIS — N83209 Unspecified ovarian cyst, unspecified side: Secondary | ICD-10-CM

## 2013-06-07 DIAGNOSIS — Z79899 Other long term (current) drug therapy: Secondary | ICD-10-CM | POA: Insufficient documentation

## 2013-06-07 DIAGNOSIS — R300 Dysuria: Secondary | ICD-10-CM | POA: Insufficient documentation

## 2013-06-07 LAB — URINALYSIS, ROUTINE W REFLEX MICROSCOPIC
Bilirubin Urine: NEGATIVE
Ketones, ur: NEGATIVE mg/dL
Leukocytes, UA: NEGATIVE
Nitrite: NEGATIVE
Urobilinogen, UA: 0.2 mg/dL (ref 0.0–1.0)
pH: 8.5 — ABNORMAL HIGH (ref 5.0–8.0)

## 2013-06-07 LAB — CBC WITH DIFFERENTIAL/PLATELET
Basophils Absolute: 0 10*3/uL (ref 0.0–0.1)
Basophils Relative: 0 % (ref 0–1)
Eosinophils Absolute: 0 10*3/uL (ref 0.0–0.7)
Eosinophils Relative: 1 % (ref 0–5)
Lymphs Abs: 3.1 10*3/uL (ref 0.7–4.0)
MCH: 30.2 pg (ref 26.0–34.0)
MCHC: 36.2 g/dL — ABNORMAL HIGH (ref 30.0–36.0)
MCV: 83.4 fL (ref 78.0–100.0)
Neutrophils Relative %: 40 % — ABNORMAL LOW (ref 43–77)
Platelets: 322 10*3/uL (ref 150–400)
RBC: 4.04 MIL/uL (ref 3.87–5.11)
RDW: 12.2 % (ref 11.5–15.5)

## 2013-06-07 LAB — BASIC METABOLIC PANEL
Calcium: 9.5 mg/dL (ref 8.4–10.5)
GFR calc Af Amer: >90 mL/min (ref >90)
GFR calc non Af Amer: >90 mL/min (ref >90)
Glucose, Bld: 96 mg/dL (ref 70–99)
Potassium: 2.8 mEq/L — ABNORMAL LOW (ref 3.5–5.1)
Sodium: 131 mEq/L — ABNORMAL LOW (ref 135–145)

## 2013-06-07 MED ORDER — SODIUM CHLORIDE 0.9 % IV BOLUS (SEPSIS)
1000.0000 mL | Freq: Once | INTRAVENOUS | Status: AC
  Administered 2013-06-07: 1000 mL via INTRAVENOUS

## 2013-06-07 MED ORDER — POTASSIUM CHLORIDE 10 MEQ/100ML IV SOLN
10.0000 meq | Freq: Once | INTRAVENOUS | Status: AC
  Administered 2013-06-07: 10 meq via INTRAVENOUS
  Filled 2013-06-07: qty 100

## 2013-06-07 NOTE — ED Notes (Signed)
Pt connected to cardiac monitor.

## 2013-06-07 NOTE — ED Provider Notes (Signed)
CSN: [id number]     Arrival date & time 06/07/13  2059 History  This chart was scribed for non-physician practitioner Felicie Morn, NP working with Doug Sou, MD by Caryn Bee, ED Scribe and Greggory Stallion, ED scribe. This patient was seen in room TR11C/TR11C and the patient's care was started at 9:29 PM.    Chief Complaint  Patient presents with  . Back Pain   Patient is a 27 y.o. female presenting with back pain. The history is provided by the patient. A language interpreter was used.  Back Pain Pain location: right flank. Radiates to:  Does not radiate Pain severity:  Moderate Pain is:  Same all the time Onset quality:  Gradual Duration:  2 days Timing:  Constant Progression:  Unchanged Chronicity:  New Relieved by:  None tried Worsened by:  Nothing tried Ineffective treatments:  None tried Associated symptoms: dysuria and fever    HPI Comments: [name] is a 27 y.o. Female with h/o bulimia and anxiety presents to the Emergency Department complaining of lower right side back pain that began a few days ago. Pt has associated dysuria, hematuria, fever, dizziness, nausea, emesis, and urinary retention. Pt has about 3 episodes of emesis per day after she eats, but states that it is due to her bulimia. She uses laxatives sometimes. She denies urinary frequency. Pt denies having a h/o kidney stones, but has had a h/o UTIs.   Past Medical History  Diagnosis Date  . Eating disorder   . Bulimia    History reviewed. No pertinent past surgical history. Family History  Problem Relation Age of Onset  . Heart disease Mother    History  Substance Use Topics  . Smoking status: Never Smoker   . Smokeless tobacco: Not on file  . Alcohol Use: No   OB History   Grav Para Term Preterm Abortions TAB SAB Ect Mult Living                 Review of Systems  Constitutional: Positive for fever.  Gastrointestinal: Positive for nausea and vomiting.  Genitourinary: Positive for  dysuria and hematuria. Negative for frequency.  Musculoskeletal: Positive for back pain.  Neurological: Positive for dizziness.  All other systems reviewed and are negative.    Allergies  Review of patient's allergies indicates no known allergies.  Home Medications   Current Outpatient Rx  Name  Route  Sig  Dispense  Refill  . citalopram (CELEXA) 10 MG tablet   Oral   Take 10 mg by mouth daily.         . ondansetron (ZOFRAN ODT) 4 MG disintegrating tablet   Oral   Take 1 tablet (4 mg total) by mouth every 8 (eight) hours as needed for nausea.   20 tablet   0   . pantoprazole (PROTONIX) 20 MG tablet   Oral   Take 2 tablets (40 mg total) by mouth daily.   30 tablet   0   . potassium chloride (K-DUR) 10 MEQ tablet   Oral   Take 2 tablets (20 mEq total) by mouth 2 (two) times daily.   20 tablet   0   . sucralfate (CARAFATE) 1 G tablet   Oral   Take 1 tablet (1 g total) by mouth 4 (four) times daily.   30 tablet   0    BP 98/64  Pulse 81  Temp(Src) 98.3 F (36.8 C) (Oral)  Resp 14  SpO2 100%  LMP 04/25/2013 Physical Exam  Nursing note and vitals reviewed. Constitutional: She is oriented to person, place, and time. She appears well-developed and well-nourished. No distress.  HENT:  Head: Normocephalic and atraumatic.  Right Ear: Tympanic membrane and external ear normal.  Left Ear: Tympanic membrane and external ear normal.  Mouth/Throat: Uvula is midline, oropharynx is clear and moist and mucous membranes are normal.  Eyes: EOM are normal.  Neck: Normal range of motion. Neck supple. No tracheal deviation present.  Cardiovascular: Normal rate, regular rhythm and normal heart sounds.   Pulmonary/Chest: Effort normal and breath sounds normal. No respiratory distress. She has no wheezes. She has no rales.  Musculoskeletal: Normal range of motion.  Tenderness to right lower back.  Neurological: She is alert and oriented to person, place, and time.  Skin: Skin  is warm and dry.  Psychiatric: She has a normal mood and affect. Her behavior is normal.    ED Course  Procedures (including critical care time) DIAGNOSTIC STUDIES: Oxygen Saturation is 100% on room air, normal by my interpretation.    COORDINATION OF CARE: 9:33 PM-Discussed treatment plan which includes UA with pt at bedside and pt agreed to plan.     Labs Review Labs Reviewed  URINALYSIS, ROUTINE W REFLEX MICROSCOPIC   Imaging Review No results found. Old records reviewed.  Patient with multiple visits for low back pain, frequently with concurrent complaint of dysuria and hematuria.  Patient with history of bulimia and self-induced vomiting.  Patient presents today with complaint of right flank pain with dysuria/hematuria. Periumbilical discomfort.  No rebound. Today's UA negative for UTI or hematuria.  Labs reveal mild hyponatremia and hypokalemia.  Will start IV fluids and IV potassium replacement.  Discussed with Dr. Ethelda Chick.  Will add CT A/P with contrast, lipase, LFT's.   Patient discussed with Dr. Arnoldo Morale at shift change.  Patient is awaiting CT.  MDM    I personally performed the services described in this documentation, which was scribed in my presence. The recorded information has been reviewed and is accurate.   Jimmye Norman, NP 06/11/13 1505

## 2013-06-07 NOTE — ED Notes (Signed)
Pt. reports low back pain with hematuria ( x1) and dysuria onset 2 weeks ago.

## 2013-06-07 NOTE — ED Notes (Signed)
Utilized [employer] administrator with PA and myself present.  Interpreter [phone], Marjean Donna.  Pt reports right flank pain for several days. Denies hx kidney stones, reports burning urination.  Reports history of bulimia, admits to vomiting 3 times daily after eating most days.  Also reports dizziness, asking to have her potassium checked.

## 2013-06-08 ENCOUNTER — Encounter (HOSPITAL_COMMUNITY): Payer: Self-pay | Admitting: Radiology

## 2013-06-08 ENCOUNTER — Emergency Department (HOSPITAL_COMMUNITY): Payer: No Typology Code available for payment source

## 2013-06-08 LAB — HEPATIC FUNCTION PANEL
Albumin: 4.1 g/dL (ref 3.5–5.2)
Alkaline Phosphatase: 60 U/L (ref 39–117)
Total Protein: 7.2 g/dL (ref 6.0–8.3)

## 2013-06-08 MED ORDER — ONDANSETRON HCL 4 MG/2ML IJ SOLN
4.0000 mg | Freq: Once | INTRAMUSCULAR | Status: AC
  Administered 2013-06-08: 4 mg via INTRAVENOUS
  Filled 2013-06-08: qty 2

## 2013-06-08 MED ORDER — MORPHINE SULFATE 4 MG/ML IJ SOLN
4.0000 mg | Freq: Once | INTRAMUSCULAR | Status: AC
  Administered 2013-06-08: 4 mg via INTRAVENOUS
  Filled 2013-06-08: qty 1

## 2013-06-08 MED ORDER — SODIUM CHLORIDE 0.9 % IV SOLN
Freq: Once | INTRAVENOUS | Status: AC
  Administered 2013-06-08: via INTRAVENOUS

## 2013-06-08 MED ORDER — IOHEXOL 300 MG/ML  SOLN
25.0000 mL | INTRAMUSCULAR | Status: AC
  Administered 2013-06-08 (×2): 25 mL via ORAL

## 2013-06-08 MED ORDER — IOHEXOL 300 MG/ML  SOLN
80.0000 mL | Freq: Once | INTRAMUSCULAR | Status: AC | PRN
  Administered 2013-06-08: 80 mL via INTRAVENOUS

## 2013-06-08 NOTE — ED Provider Notes (Signed)
History is obtained medical interpreter using [employer] language line. Complains of right flank pain rating to lower abdomen onset 2 weeks ago. Symptoms accompanied by vomiting. Patient reports she vomited one time yesterday. On exam alert no distress lungs clear auscultation heart regular in rhythm abdomen nondistended tender at lower quadrants bilaterally positive right flank tenderness.  Doug Sou, MD 06/08/13 [phone]

## 2013-06-08 NOTE — ED Notes (Signed)
Not made regarding infection control made on incorrect pt; disregard.

## 2013-06-12 ENCOUNTER — Encounter (HOSPITAL_COMMUNITY): Payer: Self-pay | Admitting: *Deleted

## 2013-06-12 ENCOUNTER — Emergency Department (INDEPENDENT_AMBULATORY_CARE_PROVIDER_SITE_OTHER)
Admission: EM | Admit: 2013-06-12 | Discharge: 2013-06-12 | Disposition: A | Discharge status: Nursing Home (Includes ICF) | Payer: No Typology Code available for payment source | Source: Home / Self Care | Attending: Emergency Medicine | Admitting: Emergency Medicine

## 2013-06-12 DIAGNOSIS — F502 Bulimia nervosa, unspecified: Secondary | ICD-10-CM

## 2013-06-12 DIAGNOSIS — E876 Hypokalemia: Secondary | ICD-10-CM

## 2013-06-12 DIAGNOSIS — M549 Dorsalgia, unspecified: Secondary | ICD-10-CM

## 2013-06-12 DIAGNOSIS — N83209 Unspecified ovarian cyst, unspecified side: Secondary | ICD-10-CM

## 2013-06-12 LAB — POCT I-STAT, CHEM 8
BUN: 7 mg/dL (ref 6–23)
Hemoglobin: 13.3 g/dL (ref 12.0–15.0)
Potassium: 3.6 mEq/L (ref 3.5–5.1)
Sodium: 134 mEq/L — ABNORMAL LOW (ref 135–145)
TCO2: 29 mmol/L (ref 0–100)

## 2013-06-12 MED ORDER — ONDANSETRON HCL 8 MG PO TABS: 8.0000 mg | ORAL_TABLET | Freq: Three times a day (TID) | ORAL | Status: DC | PRN

## 2013-06-12 MED ORDER — POTASSIUM CHLORIDE CRYS ER 20 MEQ PO TBCR: 20.0000 meq | EXTENDED_RELEASE_TABLET | Freq: Every day | ORAL | Status: DC

## 2013-06-12 NOTE — ED Notes (Signed)
Pt   Was   Seen       Er     5 days      Ago       For  Low  Potassium              Had  Iv  Fluids      And  meds        Here  Today      For  Recheck    Of  Ovarian cyst  And  Back pain  And      Potassium     -  The  Symptoms  For  About  1  Month

## 2013-06-12 NOTE — ED Notes (Signed)
[employer]  Was  Utilized

## 2013-06-12 NOTE — ED Provider Notes (Signed)
Chief Complaint:   Chief Complaint  Patient presents with  . Follow-up    History of Present Illness:   [name] is a 27 year old female with bulimia. She was seen in the emergency room on September 5 because of abdominal pain, back pain, and persistent vomiting she underwent a CT scan of her abdomen which revealed a large, left ovarian cyst. She was told this was the cause of her abdominal pain and back pain. She was given Protonix and Carafate. The back pain and abdominal pain had been going on for about a month. Both seem to be better now although not completely gone away. The biggest area of concern was that her potassium was 2.8. She was given IV fluids and IV potassium, although I cannot see a repeat potassium from the emergency room. She was told to followup here to have her potassium rechecked. She was not sent home on potassium supplement. Since her discharge she's had some loose stools and constipation with occasional small amounts of blood. She's had some discharge. She denies any vaginal pain. She's had slight dysuria. She is being followed at the [facility] for her bulimia. She states right now she is still self inducing vomiting at times. The patient speaks limited English and history was obtained with the help of a telephone interpreter.  Review of Systems:  Other than noted above, the patient denies any of the following symptoms. Systemic:  No fever, chills, sweats, fatigue, myalgias, headache, or anorexia. Eye:  No redness, pain or drainage. ENT:  No earache, nasal congestion, rhinorrhea, sinus pressure, or sore throat. Lungs:  No cough, sputum production, wheezing, shortness of breath.  Cardiovascular:  No chest pain, palpitations, or syncope. GI:  No nausea, vomiting, abdominal pain or diarrhea. GU:  No dysuria, frequency, or hematuria. Skin:  No rash or pruritis.  PMFSH:  Past medical history, family history, social history, meds, and allergies were reviewed.    Physical  Exam:   Vital signs:  BP 93/63  Pulse 93  Temp(Src) 97.8 F (36.6 C) (Oral)  Resp 16  SpO2 100%  LMP 04/25/2013 General:  Alert, in no distress. Eye:  PERRL, full EOMs.  Lids and conjunctivas were normal. ENT:  TMs and canals were normal, without erythema or inflammation.  Nasal mucosa was clear and uncongested, without drainage.  Mucous membranes were moist.  Pharynx was clear, without exudate or drainage.  There were no oral ulcerations or lesions. Neck:  Supple, no adenopathy, tenderness or mass. Thyroid was normal. Lungs:  No respiratory distress.  Lungs were clear to auscultation, without wheezes, rales or rhonchi.  Breath sounds were clear and equal bilaterally. Heart:  Regular rhythm, without gallops, murmers or rubs. Abdomen:  Soft, flat, and non-tender to palpation.  No hepatosplenomagaly or mass. Skin:  Clear, warm, and dry, without rash or lesions.  Labs:   Results for orders placed during the hospital encounter of 06/12/13  POCT I-STAT, CHEM 8      Result Value Range   Sodium 134 (*) 135 - 145 mEq/L   Potassium 3.6  3.5 - 5.1 mEq/L   Chloride 95 (*) 96 - 112 mEq/L   BUN 7  6 - 23 mg/dL   Creatinine, Ser 4.09  0.50 - 1.10 mg/dL   Glucose, Bld 94  70 - 99 mg/dL   Calcium, Ion 8.11  9.14 - 1.23 mmol/L   TCO2 29  0 - 100 mmol/L   Hemoglobin 13.3  12.0 - 15.0 g/dL   HCT  39.0  36.0 - 46.0 %    Assessment:  The primary encounter diagnosis was Bulimia. Diagnoses of Hypokalemia, Ovarian cyst, and Back pain were also pertinent to this visit.  She'll need followup with gynecology for ovarian cyst. It looks like her potassium is under control, although I told her that if she did continue to self induce vomiting her potassium and continue to drop and this could be life-threatening.  Plan:   1.  The following meds were prescribed:   Discharge Medication List as of 06/12/2013  1:34 PM    START taking these medications   Details  ondansetron (ZOFRAN) 8 MG tablet Take 1 tablet (8  mg total) by mouth every 8 (eight) hours as needed for nausea., Starting 06/12/2013, Until Discontinued, Normal    potassium chloride SA (KLOR-CON M20) 20 MEQ tablet Take 1 tablet (20 mEq total) by mouth daily., Starting 06/12/2013, Until Discontinued, Normal       2.  The patient was instructed in symptomatic care and handouts were given. 3.  The patient was told to return if becoming worse in any way, if no better in 3 or 4 days, and given some red flag symptoms such as persistent abdominal pain, persistent vomiting, or worsening lower back pain that would indicate earlier return. 4.  Follow up at [facility] for the ovarian cyst.       Reuben Likes, MD 06/12/13 [phone]

## 2013-06-12 NOTE — ED Provider Notes (Signed)
Medical screening examination/treatment/procedure(s) were conducted as a shared visit with non-physician practitioner(s) and myself.  I personally evaluated the patient during the encounter  Doug Sou, MD 06/12/13 [phone]

## 2013-06-23 ENCOUNTER — Encounter (HOSPITAL_COMMUNITY): Payer: Self-pay | Admitting: Emergency Medicine

## 2013-06-23 ENCOUNTER — Emergency Department (HOSPITAL_COMMUNITY): Payer: No Typology Code available for payment source

## 2013-06-23 DIAGNOSIS — F411 Generalized anxiety disorder: Secondary | ICD-10-CM | POA: Insufficient documentation

## 2013-06-23 DIAGNOSIS — R0789 Other chest pain: Secondary | ICD-10-CM | POA: Insufficient documentation

## 2013-06-23 DIAGNOSIS — Z79899 Other long term (current) drug therapy: Secondary | ICD-10-CM | POA: Insufficient documentation

## 2013-06-23 DIAGNOSIS — K219 Gastro-esophageal reflux disease without esophagitis: Secondary | ICD-10-CM | POA: Insufficient documentation

## 2013-06-23 LAB — BASIC METABOLIC PANEL
CO2: 29 mEq/L (ref 19–32)
Chloride: 103 mEq/L (ref 96–112)
Creatinine, Ser: 0.71 mg/dL (ref 0.50–1.10)
GFR calc non Af Amer: >90 mL/min (ref >90)
Glucose, Bld: 85 mg/dL (ref 70–99)
Potassium: 4 mEq/L (ref 3.5–5.1)
Sodium: 139 mEq/L (ref 135–145)

## 2013-06-23 LAB — CBC
Hemoglobin: 11.9 g/dL — ABNORMAL LOW (ref 12.0–15.0)
MCHC: 34.8 g/dL (ref 30.0–36.0)
RBC: 3.9 MIL/uL (ref 3.87–5.11)
RDW: 13.2 % (ref 11.5–15.5)
WBC: 6.3 10*3/uL (ref 4.0–10.5)

## 2013-06-23 NOTE — ED Notes (Signed)
C/o sharp L sided chest pain that radiates down L arm with sob and nausea since yesterday.

## 2013-06-24 ENCOUNTER — Emergency Department (HOSPITAL_COMMUNITY)
Admission: EM | Admit: 2013-06-24 | Discharge: 2013-06-24 | Disposition: A | Discharge status: Home / Self Care | Payer: No Typology Code available for payment source | Attending: Emergency Medicine | Admitting: Emergency Medicine

## 2013-06-24 DIAGNOSIS — F419 Anxiety disorder, unspecified: Secondary | ICD-10-CM

## 2013-06-24 DIAGNOSIS — K219 Gastro-esophageal reflux disease without esophagitis: Secondary | ICD-10-CM

## 2013-06-24 DIAGNOSIS — R0789 Other chest pain: Secondary | ICD-10-CM

## 2013-06-24 LAB — POCT I-STAT TROPONIN I: Troponin i, poc: 0 ng/mL (ref 0.00–0.08)

## 2013-06-24 MED ORDER — LORAZEPAM 1 MG PO TABS: 0.5000 mg | ORAL_TABLET | Freq: Four times a day (QID) | ORAL | Status: DC | PRN

## 2013-06-24 MED ORDER — SUCRALFATE 1 G PO TABS
1.0000 g | ORAL_TABLET | Freq: Once | ORAL | Status: AC
  Administered 2013-06-24: 1 g via ORAL
  Filled 2013-06-24 (×2): qty 1

## 2013-06-24 MED ORDER — SUCRALFATE 1 G PO TABS: 1.0000 g | ORAL_TABLET | Freq: Four times a day (QID) | ORAL | Status: DC

## 2013-06-24 MED ORDER — LORAZEPAM 1 MG PO TABS
1.0000 mg | ORAL_TABLET | Freq: Once | ORAL | Status: AC
  Administered 2013-06-24: 1 mg via ORAL
  Filled 2013-06-24: qty 1

## 2013-06-24 NOTE — ED Provider Notes (Signed)
CSN: [id number]     Arrival date & time 06/23/13  2242 History   First MD Initiated Contact with Patient 06/24/13 0211     Chief Complaint  Patient presents with  . Chest Pain   (Consider location/radiation/quality/duration/timing/severity/associated sxs/prior Treatment) HPI Comments: This is a 27 year old female with Hx of Bulimia currently now with 3 weeks of intermittent L chest pain radiating to L arm with SOB and palpitation She has not taken any medications as a treatment  Reports her weight is stable at 90 pounds for the past 3 years  She was started on a medication for GERD several months ago which was helping but she ran out of it about 3 weks ago, this is when the pain again returned   Patient is a 27 y.o. female presenting with chest pain. The history is provided by the patient. The history is limited by a language barrier. A language interpreter was used.  Chest Pain Pain location:  L chest Pain quality: aching   Pain radiates to:  L arm Pain radiates to the back: no   Pain severity:  Mild Onset quality:  Unable to specify Duration:  3 weeks Timing:  Intermittent Progression:  Unchanged Chronicity:  Recurrent Context: movement, at rest and stress   Context: not breathing, no drug use, not eating, no intercourse, not lifting and no trauma   Relieved by:  None tried Worsened by:  Certain positions and exertion Associated symptoms: no cough, no fever, no nausea and not vomiting     Past Medical History  Diagnosis Date  . Eating disorder   . Bulimia    History reviewed. No pertinent past surgical history. Family History  Problem Relation Age of Onset  . Heart disease Mother    History  Substance Use Topics  . Smoking status: Never Smoker   . Smokeless tobacco: Not on file  . Alcohol Use: No   OB History   Grav Para Term Preterm Abortions TAB SAB Ect Mult Living                 Review of Systems  Constitutional: Negative for fever, chills, activity change,  appetite change and unexpected weight change.  Respiratory: Negative for cough.   Cardiovascular: Positive for chest pain. Negative for leg swelling.  Gastrointestinal: Negative for nausea and vomiting.  Skin: Negative for rash.  All other systems reviewed and are negative.    Allergies  Review of patient's allergies indicates no known allergies.  Home Medications   Current Outpatient Rx  Name  Route  Sig  Dispense  Refill  . citalopram (CELEXA) 10 MG tablet   Oral   Take 10 mg by mouth daily.         Marland Kitchen ibuprofen (ADVIL,MOTRIN) 200 MG tablet   Oral   Take 800 mg by mouth every 6 (six) hours as needed for pain.         Marland Kitchen LORazepam (ATIVAN) 1 MG tablet   Oral   Take 0.5 tablets (0.5 mg total) by mouth every 6 (six) hours as needed for anxiety.   10 tablet   0   . ondansetron (ZOFRAN) 8 MG tablet   Oral   Take 1 tablet (8 mg total) by mouth every 8 (eight) hours as needed for nausea.   30 tablet   2   . potassium chloride SA (KLOR-CON M20) 20 MEQ tablet   Oral   Take 1 tablet (20 mEq total) by mouth daily.  30 tablet   2   . sucralfate (CARAFATE) 1 G tablet   Oral   Take 1 tablet (1 g total) by mouth 4 (four) times daily.   120 tablet   0    BP 87/56  Pulse 69  Temp(Src) 98.3 F (36.8 C) (Oral)  Resp 18  SpO2 100%  LMP 06/06/2013 Physical Exam  Nursing note and vitals reviewed. Constitutional: She is oriented to person, place, and time. She appears well-developed and well-nourished.  HENT:  Head: Normocephalic.  Eyes: Pupils are equal, round, and reactive to light.  Neck: Normal range of motion.  Cardiovascular: Normal rate and regular rhythm.   Pulmonary/Chest: Effort normal. She exhibits tenderness.  Abdominal: Soft. She exhibits no distension.  Musculoskeletal: Normal range of motion. She exhibits no edema and no tenderness.  Neurological: She is alert and oriented to person, place, and time.  Skin: Skin is warm. No rash noted.    ED Course   Procedures (including critical care time) Labs Review Labs Reviewed  CBC - Abnormal; Notable for the following:    Hemoglobin 11.9 (*)    HCT 34.2 (*)    Platelets 427 (*)    All other components within normal limits  BASIC METABOLIC PANEL - Abnormal; Notable for the following:    BUN 4 (*)    All other components within normal limits  PRO B NATRIURETIC PEPTIDE  POCT I-STAT TROPONIN I   Imaging Review Dg Chest 2 View  06/23/2013   CLINICAL DATA:  Left chest pain radiating into the left arm with shortness of breath and nausea since yesterday.  EXAM: CHEST  2 VIEW  COMPARISON:  05/15/2013.  FINDINGS: The heart size and mediastinal contours are normal. The lungs are clear. There is no pleural effusion or pneumothorax. No acute osseous findings are identified.  IMPRESSION: Stable examination. No active cardiopulmonary process.   Electronically Signed   By: Roxy Horseman   On: 06/23/2013 23:36    MDM   1. Atypical chest pain   2. GERD (gastroesophageal reflux disease)   3. Anxiety    The timing of running out of GERD medication and return of pain leads me to believe this is truly GERD  Cardiac evaluation is negative for any pathology , lab normal  Despite of Bulimia hx  After receiving Ativan and Carafate in the ED patient comfortable   Arman Filter, NP 06/24/13 0418  Arman Filter, NP 06/24/13 0419  Arman Filter, NP 06/24/13 0421

## 2013-06-24 NOTE — ED Provider Notes (Signed)
Medical screening examination/treatment/procedure(s) were performed by non-physician practitioner and as supervising physician I was immediately available for consultation/collaboration.  Ryna Beckstrom M Fleda Pagel, MD 06/24/13 0524 

## 2013-06-24 NOTE — ED Notes (Signed)
The pt speaks a little english enough to communicate.  C/o lt upper chest pain and lt arm pain for 2 months.  No pain at present

## 2013-06-25 LAB — POCT I-STAT TROPONIN I: Troponin i, poc: 0 ng/mL (ref 0.00–0.08)

## 2013-07-01 ENCOUNTER — Encounter: Payer: Self-pay | Admitting: Obstetrics and Gynecology

## 2013-07-05 ENCOUNTER — Emergency Department (HOSPITAL_COMMUNITY)
Admission: EM | Admit: 2013-07-05 | Discharge: 2013-07-05 | Disposition: A | Discharge status: Home / Self Care | Payer: No Typology Code available for payment source | Attending: Emergency Medicine | Admitting: Emergency Medicine

## 2013-07-05 DIAGNOSIS — R112 Nausea with vomiting, unspecified: Secondary | ICD-10-CM | POA: Insufficient documentation

## 2013-07-05 DIAGNOSIS — E876 Hypokalemia: Secondary | ICD-10-CM | POA: Insufficient documentation

## 2013-07-05 DIAGNOSIS — R636 Underweight: Secondary | ICD-10-CM | POA: Insufficient documentation

## 2013-07-05 DIAGNOSIS — M542 Cervicalgia: Secondary | ICD-10-CM | POA: Insufficient documentation

## 2013-07-05 DIAGNOSIS — R209 Unspecified disturbances of skin sensation: Secondary | ICD-10-CM | POA: Insufficient documentation

## 2013-07-05 DIAGNOSIS — R300 Dysuria: Secondary | ICD-10-CM | POA: Insufficient documentation

## 2013-07-05 DIAGNOSIS — F502 Bulimia nervosa, unspecified: Secondary | ICD-10-CM | POA: Insufficient documentation

## 2013-07-05 DIAGNOSIS — M545 Low back pain, unspecified: Secondary | ICD-10-CM | POA: Insufficient documentation

## 2013-07-05 DIAGNOSIS — M79609 Pain in unspecified limb: Secondary | ICD-10-CM | POA: Insufficient documentation

## 2013-07-05 DIAGNOSIS — R42 Dizziness and giddiness: Secondary | ICD-10-CM | POA: Insufficient documentation

## 2013-07-05 DIAGNOSIS — Z3202 Encounter for pregnancy test, result negative: Secondary | ICD-10-CM | POA: Insufficient documentation

## 2013-07-05 LAB — URINALYSIS, ROUTINE W REFLEX MICROSCOPIC
Glucose, UA: NEGATIVE mg/dL
Ketones, ur: NEGATIVE mg/dL
Leukocytes, UA: NEGATIVE
Protein, ur: NEGATIVE mg/dL
Specific Gravity, Urine: 1.012 (ref 1.005–1.030)

## 2013-07-05 LAB — BASIC METABOLIC PANEL
BUN: 6 mg/dL (ref 6–23)
CO2: 39 mEq/L — ABNORMAL HIGH (ref 19–32)
Calcium: 9.6 mg/dL (ref 8.4–10.5)
Chloride: 85 mEq/L — ABNORMAL LOW (ref 96–112)
Creatinine, Ser: 0.8 mg/dL (ref 0.50–1.10)
GFR calc Af Amer: >90 mL/min (ref >90)
GFR calc non Af Amer: >90 mL/min (ref >90)
Potassium: 2.7 mEq/L — CL (ref 3.5–5.1)

## 2013-07-05 LAB — PREGNANCY, URINE: Preg Test, Ur: NEGATIVE

## 2013-07-05 MED ORDER — POTASSIUM CHLORIDE CRYS ER 20 MEQ PO TBCR: 20.0000 meq | EXTENDED_RELEASE_TABLET | Freq: Two times a day (BID) | ORAL | Status: DC

## 2013-07-05 MED ORDER — POTASSIUM CHLORIDE CRYS ER 20 MEQ PO TBCR
20.0000 meq | EXTENDED_RELEASE_TABLET | Freq: Once | ORAL | Status: AC
  Administered 2013-07-05: 20 meq via ORAL
  Filled 2013-07-05: qty 1

## 2013-07-05 NOTE — ED Notes (Signed)
Through [facility] phone pt was given d/c instructions and education on d/c meds. Verbalizes and states back understanding.

## 2013-07-05 NOTE — ED Notes (Signed)
Critical K of 2.7 reported to Elpidio Anis, [place].

## 2013-07-05 NOTE — ED Provider Notes (Signed)
CSN: [id number]     Arrival date & time 07/05/13  1314 History  This chart was scribed for Elpidio Anis, PA working with Vida Roller, MD by Quintella Reichert, ED Scribe. This patient was seen in room TR11C/TR11C and the patient's care was started at 2:04 PM.  Chief Complaint  Patient presents with  . Arm Pain    The history is provided by the patient. The history is limited by a language barrier. A language interpreter was used.    HPI Comments: [name] is a 27 y.o. female with h/o bulimia who presents to the Emergency Department complaining of 5 days of left upper arm pain.  Pt denies injury.  She states pain is intermittent and the area also occasionally becomes numb.  She also notes some intermittent purple discoloration to the area.  She states the pain occasionally "grabs me all over the left side" of her body.  She has attempted to treat pain with ibuprofen without relief.  She also complains of one month of pain to the lower back "in the kidney area" with associated dysuria and difficulty urinating.  In addition she notes some mild neck pain.  She also complains of intermittent nausea and dizziness.  She denies fever or vaginal discharge.  LNMP was 3 weeks ago and was abnormal but she states this is normal for her due to her bulimia.  She is right-handed.  Pt notes that she has a h/o hypokalemia and states she would like to have her potassium levels checked.   Past Medical History  Diagnosis Date  . Eating disorder   . Bulimia     No past surgical history on file.   Family History  Problem Relation Age of Onset  . Heart disease Mother     History  Substance Use Topics  . Smoking status: Never Smoker   . Smokeless tobacco: Not on file  . Alcohol Use: No    OB History   Grav Para Term Preterm Abortions TAB SAB Ect Mult Living                  Review of Systems  Constitutional: Negative for fever.  HENT: Positive for neck pain.   Gastrointestinal: Positive  for nausea and vomiting.  Genitourinary: Positive for dysuria, difficulty urinating and menstrual problem (chronic). Negative for vaginal discharge.  Musculoskeletal: Positive for back pain.       Left arm pain  Skin: Positive for color change.  Neurological: Positive for dizziness and numbness.  All other systems reviewed and are negative.     Allergies  Review of patient's allergies indicates no known allergies.  Home Medications  No current outpatient prescriptions on file.  BP 92/62  Pulse 69  Temp(Src) 98.2 F (36.8 C) (Oral)  Resp 18  SpO2 98%  LMP 06/06/2013  Physical Exam  Nursing note and vitals reviewed. Constitutional: She is oriented to person, place, and time. She appears well-developed. No distress.  HENT:  Head: Normocephalic and atraumatic.  Eyes: EOM are normal.  Neck: Neck supple. No tracheal deviation present.  Minimal left paracervical tenderness, thought noncontributory.  Cardiovascular: Normal rate, regular rhythm and normal heart sounds.   No murmur heard. Pulmonary/Chest: Effort normal and breath sounds normal. No respiratory distress. She has no wheezes. She has no rales.  Abdominal: Soft. There is no tenderness.  Abdomen completely nontender.  Musculoskeletal: Normal range of motion.  Left upper extremity unremarkable in appearance.  No palpable masses, full ROM, normal  strength, distal pulses intact.  Neurological: She is alert and oriented to person, place, and time.  Skin: Skin is warm and dry.  Psychiatric: She has a normal mood and affect. Her behavior is normal.    ED Course  Procedures (including critical care time)  DIAGNOSTIC STUDIES: Oxygen Saturation is 98% on room air, normal by my interpretation.    COORDINATION OF CARE: 2:18 PM-Discussed treatment plan which includes bloodwork and UA with pt at bedside and pt agreed to plan.    Labs Review Labs Reviewed  URINALYSIS, ROUTINE W REFLEX MICROSCOPIC - Abnormal; Notable for  the following:    pH 8.5 (*)    All other components within normal limits  PREGNANCY, URINE  BASIC METABOLIC PANEL   Results for orders placed during the hospital encounter of 07/05/13  URINALYSIS, ROUTINE W REFLEX MICROSCOPIC      Result Value Range   Color, Urine YELLOW  YELLOW   APPearance CLEAR  CLEAR   Specific Gravity, Urine 1.012  1.005 - 1.030   pH 8.5 (*) 5.0 - 8.0   Glucose, UA NEGATIVE  NEGATIVE mg/dL   Hgb urine dipstick NEGATIVE  NEGATIVE   Bilirubin Urine NEGATIVE  NEGATIVE   Ketones, ur NEGATIVE  NEGATIVE mg/dL   Protein, ur NEGATIVE  NEGATIVE mg/dL   Urobilinogen, UA 0.2  0.0 - 1.0 mg/dL   Nitrite NEGATIVE  NEGATIVE   Leukocytes, UA NEGATIVE  NEGATIVE  PREGNANCY, URINE      Result Value Range   Preg Test, Ur NEGATIVE  NEGATIVE  BASIC METABOLIC PANEL      Result Value Range   Sodium 131 (*) 135 - 145 mEq/L   Potassium 2.7 (*) 3.5 - 5.1 mEq/L   Chloride 85 (*) 96 - 112 mEq/L   CO2 39 (*) 19 - 32 mEq/L   Glucose, Bld 86  70 - 99 mg/dL   BUN 6  6 - 23 mg/dL   Creatinine, Ser 1.61  0.50 - 1.10 mg/dL   Calcium 9.6  8.4 - 09.6 mg/dL   GFR calc non Af Amer >90  >90 mL/min   GFR calc Af Amer >90  >90 mL/min     Imaging Review No results found.  MDM  No diagnosis found. 1. Left upper extremity pain 2. Dysuria 3. hypokalemia 4. Underweight with h/o bulimia  Patient examined by Dr. Hyacinth Meeker. UE pain without neurologic or muscular exam finding. No swelling, discoloration - doubt clot or infection. Does not follow radicular pattern. Her potassium is low at 2.7 and will supplement. Urine culture pending. Advised to follow up with PCP for recheck next week.     I personally performed the services described in this documentation, which was scribed in my presence. The recorded information has been reviewed and is accurate.     Arnoldo Hooker, PA-C 07/05/13 1539

## 2013-07-05 NOTE — ED Notes (Signed)
Pt c/o left arm pain x 5 days. Speaks limited English so will have to get details via the language line.

## 2013-07-05 NOTE — ED Notes (Signed)
Spoke with pt via language line. She is c/o left neck and arm pain but predominately is concerned about her K due to admitted "bollemia".  Pt c/o lower back pain and pain with urination.

## 2013-07-08 NOTE — ED Provider Notes (Signed)
L arm pain, diffuse - not focal, no associated swelling, very supple arm without swelling, erythema or obvious ttp with palpation and ROM - no joint restriction.  She appears benign.  Nsaids, f/u.  Medical screening examination/treatment/procedure(s) were conducted as a shared visit with non-physician practitioner(s) and myself.  I personally evaluated the patient during the encounter.  Vida Roller, MD 07/08/13 [phone]

## 2013-07-12 ENCOUNTER — Emergency Department (HOSPITAL_COMMUNITY)
Admission: EM | Admit: 2013-07-12 | Discharge: 2013-07-12 | Disposition: A | Discharge status: Home / Self Care | Payer: No Typology Code available for payment source | Attending: Emergency Medicine | Admitting: Emergency Medicine

## 2013-07-12 ENCOUNTER — Encounter (HOSPITAL_COMMUNITY): Payer: Self-pay | Admitting: Emergency Medicine

## 2013-07-12 DIAGNOSIS — R632 Polyphagia: Secondary | ICD-10-CM | POA: Insufficient documentation

## 2013-07-12 DIAGNOSIS — E876 Hypokalemia: Secondary | ICD-10-CM

## 2013-07-12 DIAGNOSIS — F502 Bulimia nervosa: Secondary | ICD-10-CM

## 2013-07-12 DIAGNOSIS — R1110 Vomiting, unspecified: Secondary | ICD-10-CM | POA: Insufficient documentation

## 2013-07-12 LAB — BASIC METABOLIC PANEL
BUN: 10 mg/dL (ref 6–23)
Calcium: 9.2 mg/dL (ref 8.4–10.5)
Chloride: 90 mEq/L — ABNORMAL LOW (ref 96–112)
Creatinine, Ser: 0.67 mg/dL (ref 0.50–1.10)
GFR calc Af Amer: >90 mL/min (ref >90)
GFR calc non Af Amer: >90 mL/min (ref >90)
Sodium: 132 mEq/L — ABNORMAL LOW (ref 135–145)

## 2013-07-12 LAB — CBC
HCT: 32.5 % — ABNORMAL LOW (ref 36.0–46.0)
MCHC: 35.1 g/dL (ref 30.0–36.0)
MCV: 85.3 fL (ref 78.0–100.0)
RDW: 12 % (ref 11.5–15.5)
WBC: 5.1 10*3/uL (ref 4.0–10.5)

## 2013-07-12 MED ORDER — POTASSIUM CHLORIDE CRYS ER 20 MEQ PO TBCR
40.0000 meq | EXTENDED_RELEASE_TABLET | Freq: Once | ORAL | Status: AC
  Administered 2013-07-12: 40 meq via ORAL
  Filled 2013-07-12: qty 2

## 2013-07-12 MED ORDER — POTASSIUM CHLORIDE CRYS ER 20 MEQ PO TBCR: 40.0000 meq | EXTENDED_RELEASE_TABLET | Freq: Every day | ORAL | Status: DC

## 2013-07-12 NOTE — ED Provider Notes (Signed)
CSN: [id number]     Arrival date & time 07/12/13  1740 History   First MD Initiated Contact with Patient 07/12/13 2023     Chief Complaint  Patient presents with  . Chest Pain   (Consider location/radiation/quality/duration/timing/severity/associated sxs/prior Treatment) HPI Comments: Patient presents to the ER for evaluation of intermittent episodes of chest pain. Patient has been evaluated for this multiple times in the past. Patient has history of bulimia and chest pain secondary to her bulimia. Patient admits to continuing to push herself vomit because she feels guilty after she eats. She is not short of breath.  Patient is a 27 y.o. female presenting with chest pain.  Chest Pain Associated symptoms: vomiting     Past Medical History  Diagnosis Date  . Eating disorder   . Bulimia    History reviewed. No pertinent past surgical history. Family History  Problem Relation Age of Onset  . Heart disease Mother    History  Substance Use Topics  . Smoking status: Never Smoker   . Smokeless tobacco: Not on file  . Alcohol Use: No   OB History   Grav Para Term Preterm Abortions TAB SAB Ect Mult Living                 Review of Systems  Cardiovascular: Positive for chest pain.  Gastrointestinal: Positive for vomiting.  All other systems reviewed and are negative.    Allergies  Review of patient's allergies indicates no known allergies.  Home Medications  No current outpatient prescriptions on file. BP 97/63  Pulse 63  Temp(Src) 98.4 F (36.9 C) (Oral)  Resp 12  SpO2 100%  LMP 06/06/2013 Physical Exam  Constitutional: She is oriented to person, place, and time. She appears well-developed and well-nourished. No distress.  HENT:  Head: Normocephalic and atraumatic.  Right Ear: Hearing normal.  Left Ear: Hearing normal.  Nose: Nose normal.  Mouth/Throat: Oropharynx is clear and moist and mucous membranes are normal.  Eyes: Conjunctivae and EOM are normal. Pupils  are equal, round, and reactive to light.  Neck: Normal range of motion. Neck supple.  Cardiovascular: Regular rhythm, S1 normal and S2 normal.  Exam reveals no gallop and no friction rub.   No murmur heard. Pulmonary/Chest: Effort normal and breath sounds normal. No respiratory distress. She exhibits no tenderness.  Abdominal: Soft. Normal appearance and bowel sounds are normal. There is no hepatosplenomegaly. There is no tenderness. There is no rebound, no guarding, no tenderness at McBurney's point and negative Murphy's sign. No hernia.  Musculoskeletal: Normal range of motion.  Neurological: She is alert and oriented to person, place, and time. She has normal strength. No cranial nerve deficit or sensory deficit. Coordination normal. GCS eye subscore is 4. GCS verbal subscore is 5. GCS motor subscore is 6.  Skin: Skin is warm, dry and intact. No rash noted. No cyanosis.  Psychiatric: She has a normal mood and affect. Her speech is normal and behavior is normal. Thought content normal.    ED Course  Procedures (including critical care time) Labs Review Labs Reviewed  CBC - Abnormal; Notable for the following:    RBC 3.81 (*)    Hemoglobin 11.4 (*)    HCT 32.5 (*)    All other components within normal limits  BASIC METABOLIC PANEL - Abnormal; Notable for the following:    Sodium 132 (*)    Potassium 2.6 (*)    Chloride 90 (*)    CO2 34 (*)  All other components within normal limits  POCT I-STAT TROPONIN I   Imaging Review No results found.  EKG Interpretation   None       MDM  Diagnosis: 1. Hypokalemia 2. Bulemia  Patient presents to the ER for evaluation of chest pain. Patient has been seen multiple times for this in the past. This does not have any cardiac risk factors. Her chest pain is secondary to her frequent vomiting from her bulimia. Patient's abdominal exam is benign. 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 She will be repleted. Patient does not wish to have any further psychiatric evaluation for her bulimia, reports that she is already seeing a therapist. She does not need any further workup. Discharged with prescription for potassium.    Gilda Crease, MD 07/12/13 2251

## 2013-07-12 NOTE — ED Notes (Signed)
Infrequent CP lasting 8 days. Seen by PCP on Monday, recommended to go to ED if CP returns. NO SOB, NAD. Ambulatory at triage Spanish speaking. Interpreter used

## 2013-07-18 ENCOUNTER — Encounter: Payer: Self-pay | Admitting: Obstetrics & Gynecology

## 2013-07-25 ENCOUNTER — Emergency Department (HOSPITAL_COMMUNITY)
Admission: EM | Admit: 2013-07-25 | Discharge: 2013-07-25 | Disposition: A | Discharge status: Home / Self Care | Payer: Self-pay | Attending: Emergency Medicine | Admitting: Emergency Medicine

## 2013-07-25 ENCOUNTER — Emergency Department (HOSPITAL_COMMUNITY): Payer: Self-pay

## 2013-07-25 ENCOUNTER — Encounter (HOSPITAL_COMMUNITY): Payer: Self-pay | Admitting: Emergency Medicine

## 2013-07-25 DIAGNOSIS — R1013 Epigastric pain: Secondary | ICD-10-CM | POA: Insufficient documentation

## 2013-07-25 DIAGNOSIS — Z79899 Other long term (current) drug therapy: Secondary | ICD-10-CM | POA: Insufficient documentation

## 2013-07-25 DIAGNOSIS — R350 Frequency of micturition: Secondary | ICD-10-CM | POA: Insufficient documentation

## 2013-07-25 DIAGNOSIS — R300 Dysuria: Secondary | ICD-10-CM | POA: Insufficient documentation

## 2013-07-25 DIAGNOSIS — R079 Chest pain, unspecified: Secondary | ICD-10-CM | POA: Insufficient documentation

## 2013-07-25 DIAGNOSIS — Z8659 Personal history of other mental and behavioral disorders: Secondary | ICD-10-CM | POA: Insufficient documentation

## 2013-07-25 DIAGNOSIS — R42 Dizziness and giddiness: Secondary | ICD-10-CM | POA: Insufficient documentation

## 2013-07-25 DIAGNOSIS — Z3202 Encounter for pregnancy test, result negative: Secondary | ICD-10-CM | POA: Insufficient documentation

## 2013-07-25 DIAGNOSIS — R112 Nausea with vomiting, unspecified: Secondary | ICD-10-CM | POA: Insufficient documentation

## 2013-07-25 LAB — URINALYSIS, ROUTINE W REFLEX MICROSCOPIC
Glucose, UA: NEGATIVE mg/dL
Leukocytes, UA: NEGATIVE
Protein, ur: NEGATIVE mg/dL
Specific Gravity, Urine: 1.014 (ref 1.005–1.030)

## 2013-07-25 LAB — COMPREHENSIVE METABOLIC PANEL
AST: 17 U/L (ref 0–37)
Albumin: 3.9 g/dL (ref 3.5–5.2)
CO2: 23 mEq/L (ref 19–32)
Calcium: 9.2 mg/dL (ref 8.4–10.5)
Creatinine, Ser: 0.76 mg/dL (ref 0.50–1.10)
GFR calc non Af Amer: >90 mL/min (ref >90)
Sodium: 135 mEq/L (ref 135–145)
Total Protein: 7.2 g/dL (ref 6.0–8.3)

## 2013-07-25 LAB — CBC WITH DIFFERENTIAL/PLATELET
Basophils Absolute: 0 10*3/uL (ref 0.0–0.1)
Basophils Relative: 0 % (ref 0–1)
Eosinophils Absolute: 0 10*3/uL (ref 0.0–0.7)
Eosinophils Relative: 1 % (ref 0–5)
HCT: 35.5 % — ABNORMAL LOW (ref 36.0–46.0)
MCH: 30.3 pg (ref 26.0–34.0)
MCHC: 34.4 g/dL (ref 30.0–36.0)
MCV: 88.1 fL (ref 78.0–100.0)
Monocytes Absolute: 0.3 10*3/uL (ref 0.1–1.0)
Platelets: 371 10*3/uL (ref 150–400)
RDW: 12.7 % (ref 11.5–15.5)

## 2013-07-25 LAB — PREGNANCY, URINE: Preg Test, Ur: NEGATIVE

## 2013-07-25 MED ORDER — ONDANSETRON 4 MG PO TBDP
4.0000 mg | ORAL_TABLET | Freq: Once | ORAL | Status: AC
  Administered 2013-07-25: 4 mg via ORAL
  Filled 2013-07-25: qty 1

## 2013-07-25 MED ORDER — PANTOPRAZOLE SODIUM 20 MG PO TBEC: 20.0000 mg | DELAYED_RELEASE_TABLET | Freq: Every day | ORAL | Status: DC

## 2013-07-25 MED ORDER — GI COCKTAIL ~~LOC~~
30.0000 mL | Freq: Once | ORAL | Status: AC
  Administered 2013-07-25: 30 mL via ORAL
  Filled 2013-07-25: qty 30

## 2013-07-25 NOTE — ED Notes (Addendum)
Cp x 4 days  And states has been vomiting has bulemia she states diarrhea x 2

## 2013-07-25 NOTE — ED Provider Notes (Signed)
CSN: [id number]     Arrival date & time 07/25/13  1308 History   First MD Initiated Contact with Patient 07/25/13 [phone]     Chief Complaint  Patient presents with  . Chest Pain   (Consider location/radiation/quality/duration/timing/severity/associated sxs/prior Treatment) HPI Comments: Patient history of bulimia presents with epigastric and chest pain it's been about the last 4 days. The history was obtained through the language line Spanish interpreter. She states that due to her bulimia she has self-induced vomiting about 3-4 times a day but it's depending on her anxiety level. She does see a psychiatrist here in [place] for treatment of bulimia. She states that the last 4 days she's had constant pain and left-sided lower chest. It's worse when she moves and worse when she breathes. She denies any shortness of breath. She has some pain in her epigastric area. She's had been induced vomiting but denies any hematemesis. She has had some occasional dizziness. She denies any leg pain or swelling. She's been taking ibuprofen without relief. She denies a history of heart problems in the past.  Patient is a 27 y.o. female presenting with chest pain.  Chest Pain Associated symptoms: abdominal pain, nausea and vomiting (Self-induced)   Associated symptoms: no back pain, no cough, no diaphoresis, no dizziness, no fatigue, no fever, no headache, no numbness, no shortness of breath and no weakness     Past Medical History  Diagnosis Date  . Eating disorder   . Bulimia nervosa    History reviewed. No pertinent past surgical history. No family history on file. History  Substance Use Topics  . Smoking status: Never Smoker   . Smokeless tobacco: Not on file  . Alcohol Use: No   OB History   Grav Para Term Preterm Abortions TAB SAB Ect Mult Living                 Review of Systems  Constitutional: Negative for fever, chills, diaphoresis and fatigue.  HENT: Negative for congestion, rhinorrhea  and sneezing.   Eyes: Negative.   Respiratory: Negative for cough, chest tightness and shortness of breath.   Cardiovascular: Positive for chest pain. Negative for leg swelling.  Gastrointestinal: Positive for nausea, vomiting (Self-induced) and abdominal pain. Negative for diarrhea and blood in stool.  Genitourinary: Positive for dysuria and frequency. Negative for hematuria, flank pain and difficulty urinating.  Musculoskeletal: Negative for arthralgias and back pain.  Skin: Negative for rash.  Neurological: Positive for light-headedness. Negative for dizziness, speech difficulty, weakness, numbness and headaches.  Psychiatric/Behavioral: Negative for suicidal ideas.    Allergies  Review of patient's allergies indicates no known allergies.  Home Medications   Current Outpatient Rx  Name  Route  Sig  Dispense  Refill  . ibuprofen (ADVIL,MOTRIN) 600 MG tablet   Oral   Take 1 tablet (600 mg total) by mouth every 6 (six) hours as needed for pain.   30 tablet   0   . pantoprazole (PROTONIX) 20 MG tablet   Oral   Take 1 tablet (20 mg total) by mouth daily.   30 tablet   0    BP 106/66  Pulse 83  Temp(Src) 98.7 F (37.1 C) (Oral)  Resp 17  SpO2 100%  LMP 07/18/2013 Physical Exam  Constitutional: She is oriented to person, place, and time. She appears well-developed and well-nourished.  HENT:  Head: Normocephalic and atraumatic.  Mouth/Throat: Oropharynx is clear and moist.  Eyes: Pupils are equal, round, and reactive to light.  Neck:  Normal range of motion. Neck supple.  Cardiovascular: Normal rate, regular rhythm and normal heart sounds.   Pulmonary/Chest: Effort normal and breath sounds normal. No respiratory distress. She has no wheezes. She has no rales. She exhibits tenderness (Positive reproducible tenderness to the left anterior chest wall).  Abdominal: Soft. Bowel sounds are normal. There is tenderness (Mild tenderness to the epigastrium.). There is no rebound and  no guarding.  Musculoskeletal: Normal range of motion. She exhibits no edema.  Lymphadenopathy:    She has no cervical adenopathy.  Neurological: She is alert and oriented to person, place, and time.  Skin: Skin is warm and dry. No rash noted.  Psychiatric: She has a normal mood and affect.    ED Course  Procedures (including critical care time) Labs Review Results for orders placed during the hospital encounter of 07/25/13  CBC WITH DIFFERENTIAL      Result Value Range   WBC 6.5  4.0 - 10.5 K/uL   RBC 4.03  3.87 - 5.11 MIL/uL   Hemoglobin 12.2  12.0 - 15.0 g/dL   HCT 40.3 (*) 47.4 - 25.9 %   MCV 88.1  78.0 - 100.0 fL   MCH 30.3  26.0 - 34.0 pg   MCHC 34.4  30.0 - 36.0 g/dL   RDW 56.3  87.5 - 64.3 %   Platelets 371  150 - 400 K/uL   Neutrophils Relative % 65  43 - 77 %   Neutro Abs 4.2  1.7 - 7.7 K/uL   Lymphocytes Relative 29  12 - 46 %   Lymphs Abs 1.9  0.7 - 4.0 K/uL   Monocytes Relative 5  3 - 12 %   Monocytes Absolute 0.3  0.1 - 1.0 K/uL   Eosinophils Relative 1  0 - 5 %   Eosinophils Absolute 0.0  0.0 - 0.7 K/uL   Basophils Relative 0  0 - 1 %   Basophils Absolute 0.0  0.0 - 0.1 K/uL  COMPREHENSIVE METABOLIC PANEL      Result Value Range   Sodium 135  135 - 145 mEq/L   Potassium 4.2  3.5 - 5.1 mEq/L   Chloride 101  96 - 112 mEq/L   CO2 23  19 - 32 mEq/L   Glucose, Bld 85  70 - 99 mg/dL   BUN 6  6 - 23 mg/dL   Creatinine, Ser 3.29  0.50 - 1.10 mg/dL   Calcium 9.2  8.4 - 51.8 mg/dL   Total Protein 7.2  6.0 - 8.3 g/dL   Albumin 3.9  3.5 - 5.2 g/dL   AST 17  0 - 37 U/L   ALT 11  0 - 35 U/L   Alkaline Phosphatase 73  39 - 117 U/L   Total Bilirubin 0.2 (*) 0.3 - 1.2 mg/dL   GFR calc non Af Amer >90  >90 mL/min   GFR calc Af Amer >90  >90 mL/min  LIPASE, BLOOD      Result Value Range   Lipase 38  11 - 59 U/L  URINALYSIS, ROUTINE W REFLEX MICROSCOPIC      Result Value Range   Color, Urine YELLOW  YELLOW   APPearance CLEAR  CLEAR   Specific Gravity, Urine 1.014   1.005 - 1.030   pH 8.5 (*) 5.0 - 8.0   Glucose, UA NEGATIVE  NEGATIVE mg/dL   Hgb urine dipstick NEGATIVE  NEGATIVE   Bilirubin Urine NEGATIVE  NEGATIVE   Ketones, ur 15 (*) NEGATIVE mg/dL  Protein, ur NEGATIVE  NEGATIVE mg/dL   Urobilinogen, UA 0.2  0.0 - 1.0 mg/dL   Nitrite NEGATIVE  NEGATIVE   Leukocytes, UA NEGATIVE  NEGATIVE  PREGNANCY, URINE      Result Value Range   Preg Test, Ur NEGATIVE  NEGATIVE   Dg Chest 2 View  07/25/2013   CLINICAL DATA:  27 year old female with chest pain and vomiting. Initial encounter.  EXAM: CHEST  2 VIEW  COMPARISON:  05/14/2013 and earlier.  FINDINGS: The heart size and mediastinal contours are within normal limits. Both lungs are clear. The visualized skeletal structures are unremarkable. Visualized tracheal air column is within normal limits. No pneumothorax or pneumoperitoneum.  IMPRESSION: Negative, no acute cardiopulmonary abnormality.   Electronically Signed   By: Augusto Gamble M.D.   On: 07/25/2013 11:01    Imaging Review Dg Chest 2 View  07/25/2013   CLINICAL DATA:  27 year old female with chest pain and vomiting. Initial encounter.  EXAM: CHEST  2 VIEW  COMPARISON:  05/14/2013 and earlier.  FINDINGS: The heart size and mediastinal contours are within normal limits. Both lungs are clear. The visualized skeletal structures are unremarkable. Visualized tracheal air column is within normal limits. No pneumothorax or pneumoperitoneum.  IMPRESSION: Negative, no acute cardiopulmonary abnormality.   Electronically Signed   By: Augusto Gamble M.D.   On: 07/25/2013 11:01    EKG Interpretation     Ventricular Rate:  70 PR Interval:  142 QRS Duration: 90 QT Interval:  422 QTC Calculation: 455 R Axis:   67 Text Interpretation:  Normal sinus rhythm Normal ECG since last tracing no significant change            MDM   1. Chest pain    Patient presents with chest and epigastric pain her chest pain is reproducible on palpation. She has no other  suggestions of pulmonary embolus. She has no evidence of pneumonia or pneumothorax. There is no evidence of free air or pneumo mediastinum. She has no clinical suggestions of esophageal rupture. She felt better after GI cocktail. She was discharged home in good condition. I give her prescription for Protonix and encouraged her followup with her primary care physician.    Rolan Bucco, MD 07/25/13 1214

## 2013-08-05 ENCOUNTER — Ambulatory Visit (INDEPENDENT_AMBULATORY_CARE_PROVIDER_SITE_OTHER): Payer: Self-pay | Admitting: Obstetrics & Gynecology

## 2013-08-05 ENCOUNTER — Encounter: Payer: Self-pay | Admitting: Obstetrics & Gynecology

## 2013-08-05 VITALS — BP 112/71 | HR 74 | Temp 97.0°F | Ht 61.0 in | Wt 100.2 lb

## 2013-08-05 DIAGNOSIS — N949 Unspecified condition associated with female genital organs and menstrual cycle: Secondary | ICD-10-CM

## 2013-08-05 DIAGNOSIS — R102 Pelvic and perineal pain: Secondary | ICD-10-CM

## 2013-08-05 NOTE — Patient Instructions (Signed)
Dolor pélvico en la mujer   (Pelvic Pain, Female)   Las causa del dolor pélvico en la mujer pueden ser muchas y pueden tener su origen en diferentes lugares. El dolor pélvico es el que aparece en la mitad inferior del abdomen y entre las caderas. Puede aparecer durante en un período corto de tiempo (agudo)o puede ser recurrente (crónico). Esta afección puede estar relacionada con trastornos que afectan a los órganos reproductivos femeninos (ginecológica), pero también puede deberse a problemas en la vejiga, cálculos renales, complicaciones intestinales, o problemas musculares o esqueléticos. Es importante solicitar ayuda de inmediato, sobre todo si ha sido intenso, agudo, o ha aparecido de manera súbita como un dolor inusual. También es importante obtener ayuda de inmediato, ya que algunos tipos de dolor pélvico puede poner en peligro la vida.   CAUSAS   A continuación veremos algunas de las causas del dolor pélvico. Las causas pueden clasificarse de diferentes modos.   · Ginecológica.  · Enfermedad inflamatoria pélvica.  · Infecciones de transmisión sexual.  · Quiste de ovario o torsión de un ligamento ovárico ( torsión ovárica).  · La membrana que recubre internamente al útero desarrollándose fuera del útero (endometriosis).  · Fibromas, quistes o tumores.  · Ovulación.  · Embarazo.  · Embarazo fuera del útero (embarazo ectópico).  · Aborto espontáneo.  · Trabajo de parto.  · Desprendimiento de la placenta o ruptura del útero.  · Infecciones.  · Infección uterina (endometritis).  · Infección de la vejiga.  · Diverticulitis.  · Aborto relacionado con una infección uterina (aborto séptico).  · Vejiga.  · Inflamación de la vejiga (cistitis).  · Cálculos renales.  · Gastrointenstinal.  · Estreñimiento.  · Diverticulitis.  · Neurológico.  · Traumatismos.  · Sentir dolor pélvico debido a causas mentales o emocionales (psicosomático).  · Tumores en el intestino o en la pelvis.  EVALUACIÓN   El médico hará una historia  clínica detallada según sus síntomas. Incluirá los cambios recientes en su salud, una cuidadosa historia ginecológica de sus periodos (menstruaciones) y una historia de su actividad sexual. Los antecedentes familiares y la historia clínica también son importantes. Su médico podrá indicar un examen pélvico. El examen pélvico ayudará a identificar la ubicación y la gravedad del dolor. También ayudará a evaluar los órganos que pueden estar involucrados. . Para identificar la causa del dolor pélvico y tratarlo adecuadamente, el médico puede indicar estudios. Estas pruebas pueden ser:   · Test de embarazo.  · Ecografía pélvica.  · Radiografía del abdomen.  · Un análisis de orina o la evaluación de la secreción vaginal.  · Análisis de sangre.  INSTRUCCIONES PARA EL CUIDADO EN EL HOGAR   · Solo tome medicamentos de venta libre o recetados para el dolor, malestar o fiebre, según las indicaciones del médico.    · Haga reposo según las indicaciones del médico.    · Consuma una dieta balanceada.    · Beba gran cantidad de líquido para mantener la orina de tono claro o amarillo pálido.    · Evite las relaciones sexuales, si le producen dolor.    · Aplique compresas calientes o frías en la zona baja del abdomen según cual le calme el dolor.    · Evite las situaciones estresantes.    · Lleve un registro del dolor pélvico. Anote cuándo comenzó, dónde se localiza el dolor y si hay cosas que parecen estar asociadas con el dolor, como algún alimento o su ciclo menstrual.  · Concurra a las visitas de control con el médico, según   las indicaciones.    SOLICITE ATENCIÓN MÉDICA SI:   · Los medicamentos no le calman el dolor.  · Tiene flujo vaginal anormal.  SOLICITE ATENCIÓN MÉDICA DE INMEDIATO SI:   · Tiene un sangrado abundante por la vagina.    · El dolor pélvico aumenta.    · Se siente mareada o sufre un desmayo.    · Siente escalofríos.    · Siente dolor intenso al orinar u observa sangre en la orina.    · Tiene diarrea o vómitos que  no puede controlar.    · Tiene fiebre o síntomas que persisten durante más de 3 días.  · Tiene fiebre y los síntomas empeoran.    · Ha sido abusada física o sexualmente.    ASEGÚRESE DE QUE:   · Comprende estas instrucciones.  · Controlará su enfermedad.  · Solicitará ayuda de inmediato si no mejora o si empeora.  Document Released: 12/16/2008 Document Revised: 03/20/2012  [employer]® Patient Information ©2014 [employer].

## 2013-08-05 NOTE — Progress Notes (Signed)
GYNECOLOGY CLINIC ENCOUNTER NOTE  History:  27 y.o. [id number] here today for follow up for 4.8 cm hemorrhagic cyst on CT scan in 06/08/13. Patient is Spanish-speaking only, Spanish interpreter present for this encounter.  Reports weakness, dizziness, pelvic pain, presyncopal feelings all attributed to the cyst. Patient also has bulimia, has irregular menses due to anovulatory cycles.     The following portions of the patient's history were reviewed and updated as appropriate: allergies, current medications, past family history, past medical history, past social history, past surgical history and problem list.  Last pap in 2013 was normal as per patient.  Review of Systems:  Pertinent items are noted in HPI.  Objective:  Physical Exam BP 112/71  Pulse 74  Temp(Src) 97 F (36.1 C) (Oral)  Ht 5\' 1"  (1.549 m)  Wt 100 lb 3.2 oz (45.45 kg)  BMI 18.94 kg/m2  LMP 06/26/2013 Gen: NAD Abd: Soft, nontender and nondistended Pelvic: Normal appearing external genitalia; normal appearing vaginal mucosa and cervix.  White vaginal discharge, cultures obtained. Small uterus, no other palpable masses, no uterine or adnexal tenderness. Mild discomfort noted in left adnexa.  Labs and Imaging 06/08/13  CT ABDOMEN AND PELVIS WITH CONTRAST Clinical Data: Left lower quadrant pain. History of bulimia. Findings: Focal subpleural scarring in the right lung base. The liver, spleen, gallbladder, pancreas, adrenal glands, kidneys, abdominal aorta, inferior vena cava, and retroperitoneal lymph nodes are unremarkable. The stomach, small bowel, and colon are not abnormally distended. No bowel wall thickening is appreciated. Stool filled colon. No free air or free fluid in the abdomen. Abdominal wall musculature appears intact. Pelvis: The appendix is normal. There is a complex cysticstructure demonstrated in the left adnexa measuring 4.5 cm diameter. There is internal increased density material. No significant wall thickening.  This is likely to represent a hemorrhagic cyst. There is probably another cyst in the left ovary, smaller. A small amount of free fluid in the pelvis likely representing physiologic fluid. The uterus is not enlarged. The bladder wall is not thickened. No evidence of diverticulitis. No significant pelvic lymphadenopathy. Normal alignment of the lumbar spine. No destructive bone lesions appreciated.  IMPRESSION: 4.5 cm diameter complex appearing, likely hemorrhagic, cyst in the left ovary. Recommend follow-up ultrasound in 6-12 weeks to confirm resolution. Small amount of free fluid in the pelvis is likely physiologic. ginal Report Authenticated By: Burman Nieves, M.D.   Assessment & Plan:  Pelvic ultrasound ordered and cultures done; will follow up results and manage accordingly. Routine preventative health maintenance measures emphasized. Patient to follow up with PCP for evaluation of other symptoms  Jaynie Collins, MD, FACOG Attending Obstetrician & Gynecologist Faculty Practice, [facility]

## 2013-08-06 LAB — WET PREP, GENITAL
Trich, Wet Prep: NONE SEEN
Yeast Wet Prep HPF POC: NONE SEEN

## 2013-08-12 ENCOUNTER — Encounter (HOSPITAL_COMMUNITY): Payer: Self-pay | Admitting: Emergency Medicine

## 2013-08-12 ENCOUNTER — Emergency Department (HOSPITAL_COMMUNITY): Payer: Medicaid Other

## 2013-08-12 ENCOUNTER — Emergency Department (HOSPITAL_COMMUNITY)
Admission: EM | Admit: 2013-08-12 | Discharge: 2013-08-13 | Disposition: A | Discharge status: Home / Self Care | Payer: Medicaid Other | Attending: Emergency Medicine | Admitting: Emergency Medicine

## 2013-08-12 DIAGNOSIS — F502 Bulimia nervosa, unspecified: Secondary | ICD-10-CM | POA: Insufficient documentation

## 2013-08-12 DIAGNOSIS — E876 Hypokalemia: Secondary | ICD-10-CM

## 2013-08-12 DIAGNOSIS — M4120 Other idiopathic scoliosis, site unspecified: Secondary | ICD-10-CM | POA: Insufficient documentation

## 2013-08-12 DIAGNOSIS — R079 Chest pain, unspecified: Secondary | ICD-10-CM

## 2013-08-12 DIAGNOSIS — Z79899 Other long term (current) drug therapy: Secondary | ICD-10-CM | POA: Insufficient documentation

## 2013-08-12 DIAGNOSIS — R1110 Vomiting, unspecified: Secondary | ICD-10-CM

## 2013-08-12 DIAGNOSIS — N6320 Unspecified lump in the left breast, unspecified quadrant: Secondary | ICD-10-CM

## 2013-08-12 DIAGNOSIS — N63 Unspecified lump in unspecified breast: Secondary | ICD-10-CM | POA: Insufficient documentation

## 2013-08-12 LAB — COMPREHENSIVE METABOLIC PANEL
ALT: 9 U/L (ref 0–35)
AST: 17 U/L (ref 0–37)
Alkaline Phosphatase: 98 U/L (ref 39–117)
CO2: 23 mEq/L (ref 19–32)
Calcium: 9.1 mg/dL (ref 8.4–10.5)
Chloride: 103 mEq/L (ref 96–112)
GFR calc Af Amer: >90 mL/min (ref >90)
GFR calc non Af Amer: >90 mL/min (ref >90)
Glucose, Bld: 44 mg/dL — CL (ref 70–99)
Potassium: 3.7 mEq/L (ref 3.5–5.1)
Sodium: 136 mEq/L (ref 135–145)

## 2013-08-12 LAB — CBC WITH DIFFERENTIAL/PLATELET
Basophils Absolute: 0 10*3/uL (ref 0.0–0.1)
Eosinophils Absolute: 0.1 10*3/uL (ref 0.0–0.7)
Eosinophils Relative: 1 % (ref 0–5)
HCT: 35.3 % — ABNORMAL LOW (ref 36.0–46.0)
Hemoglobin: 12.6 g/dL (ref 12.0–15.0)
Lymphocytes Relative: 39 % (ref 12–46)
Lymphs Abs: 2.8 10*3/uL (ref 0.7–4.0)
Monocytes Relative: 7 % (ref 3–12)
Neutro Abs: 3.8 10*3/uL (ref 1.7–7.7)
Neutrophils Relative %: 53 % (ref 43–77)
Platelets: 450 10*3/uL — ABNORMAL HIGH (ref 150–400)
RBC: 4.07 MIL/uL (ref 3.87–5.11)
RDW: 12.3 % (ref 11.5–15.5)
WBC: 7.2 10*3/uL (ref 4.0–10.5)

## 2013-08-12 LAB — POCT I-STAT TROPONIN I: Troponin i, poc: 0 ng/mL (ref 0.00–0.08)

## 2013-08-12 LAB — GLUCOSE, CAPILLARY: Glucose-Capillary: 90 mg/dL (ref 70–99)

## 2013-08-12 MED ORDER — IBUPROFEN 400 MG PO TABS
400.0000 mg | ORAL_TABLET | Freq: Once | ORAL | Status: AC
  Administered 2013-08-12: 400 mg via ORAL
  Filled 2013-08-12: qty 1

## 2013-08-12 MED ORDER — POTASSIUM CHLORIDE CRYS ER 20 MEQ PO TBCR
40.0000 meq | EXTENDED_RELEASE_TABLET | Freq: Once | ORAL | Status: AC
  Administered 2013-08-12: 40 meq via ORAL
  Filled 2013-08-12: qty 2

## 2013-08-12 NOTE — ED Notes (Signed)
Pt speaks spanish, interpreter phone used for triage. Pt states that on Sunday she felt palpations and CP with radiation down her right arm. Pt states that today the pain in her chest is gone but her right arm is still hurting. Pt states that she has been SOB and nausated since the pain as well.

## 2013-08-12 NOTE — ED Notes (Signed)
Following notification of low blood glucose by lab, CBG checked and noted to be 90

## 2013-08-13 ENCOUNTER — Ambulatory Visit (HOSPITAL_COMMUNITY)
Admission: RE | Admit: 2013-08-13 | Discharge: 2013-08-13 | Disposition: A | Discharge status: Home / Self Care | Payer: Medicaid Other | Source: Ambulatory Visit | Attending: Obstetrics & Gynecology | Admitting: Obstetrics & Gynecology

## 2013-08-13 ENCOUNTER — Other Ambulatory Visit: Payer: Self-pay | Admitting: Obstetrics & Gynecology

## 2013-08-13 DIAGNOSIS — R102 Pelvic and perineal pain: Secondary | ICD-10-CM

## 2013-08-13 DIAGNOSIS — N949 Unspecified condition associated with female genital organs and menstrual cycle: Secondary | ICD-10-CM | POA: Insufficient documentation

## 2013-08-13 DIAGNOSIS — Z3689 Encounter for other specified antenatal screening: Secondary | ICD-10-CM | POA: Insufficient documentation

## 2013-08-13 DIAGNOSIS — O209 Hemorrhage in early pregnancy, unspecified: Secondary | ICD-10-CM | POA: Insufficient documentation

## 2013-08-13 NOTE — ED Provider Notes (Signed)
CSN: [id number]     Arrival date & time 08/12/13  1913 History   First MD Initiated Contact with Patient 08/12/13 2204     Chief Complaint  Patient presents with  . Chest Pain   (Consider location/radiation/quality/duration/timing/severity/associated sxs/prior Treatment) HPI Comments: 27 yo old female with eating disorder, hypokalemia presents with constant chest pain for the past few days, worse with palpation. Translated by [place] speaking and phone.  Similar to previous.  Vomiting intermittent.  Pt has noticed mild swelling to left breast recently.  No fevers.  No recent surgery, leg swelling or blood clot hx.   Patient is a 27 y.o. female presenting with chest pain. The history is provided by the patient.  Chest Pain Associated symptoms: no abdominal pain, no back pain, no fever, no headache, no shortness of breath and not vomiting     Past Medical History  Diagnosis Date  . Eating disorder   . Bulimia    History reviewed. No pertinent past surgical history. Family History  Problem Relation Age of Onset  . Heart disease Mother    History  Substance Use Topics  . Smoking status: Never Smoker   . Smokeless tobacco: Never Used  . Alcohol Use: No   OB History   Grav Para Term Preterm Abortions TAB SAB Ect Mult Living   2 2 2       2      Review of Systems  Constitutional: Negative for fever and chills.  HENT: Negative for congestion.   Eyes: Negative for visual disturbance.  Respiratory: Negative for shortness of breath.   Cardiovascular: Positive for chest pain.  Gastrointestinal: Negative for vomiting and abdominal pain.  Genitourinary: Negative for dysuria and flank pain.  Musculoskeletal: Negative for back pain, neck pain and neck stiffness.  Skin: Negative for rash.  Neurological: Negative for light-headedness and headaches.    Allergies  Review of patient's allergies indicates no known allergies.  Home Medications   Current Outpatient Rx  Name  Route   Sig  Dispense  Refill  . ALPRAZolam (XANAX) 0.25 MG tablet   Oral   Take 0.25 mg by mouth 3 (three) times daily as needed for anxiety.          BP 104/57  Pulse 105  Temp(Src) 98.7 F (37.1 C) (Oral)  Resp 31  Ht 5\' 1"  (1.549 m)  Wt 100 lb 3 oz (45.445 kg)  BMI 18.94 kg/m2  SpO2 100%  LMP 06/26/2013 Physical Exam  Nursing note and vitals reviewed. Constitutional: She is oriented to person, place, and time. She appears well-developed and well-nourished.  HENT:  Head: Normocephalic and atraumatic.  Eyes: Conjunctivae are normal. Right eye exhibits no discharge. Left eye exhibits no discharge.  Neck: Normal range of motion. Neck supple. No tracheal deviation present.  Cardiovascular: Normal rate, regular rhythm and intact distal pulses.   Pulmonary/Chest: Effort normal and breath sounds normal.  Abdominal: Soft. She exhibits no distension. There is no tenderness. There is no guarding.  Musculoskeletal: She exhibits tenderness (left anterior chest wall). She exhibits no edema.  Neurological: She is alert and oriented to person, place, and time.  Skin: Skin is warm. No rash noted.  Psychiatric: She has a normal mood and affect.    ED Course  Procedures (including critical care time) Labs Review Labs Reviewed  CBC WITH DIFFERENTIAL - Abnormal; Notable for the following:    HCT 35.3 (*)    Platelets 450 (*)    All other components within normal  limits  COMPREHENSIVE METABOLIC PANEL - Abnormal; Notable for the following:    Glucose, Bld 44 (*)    Total Bilirubin 0.2 (*)    All other components within normal limits  GLUCOSE, CAPILLARY  POCT I-STAT TROPONIN I  POCT I-STAT TROPONIN I   Imaging Review Dg Chest 2 View  08/12/2013   CLINICAL DATA:  Chest pain.  EXAM: CHEST  2 VIEW  COMPARISON:  06/23/2013.  FINDINGS: Normal sized heart. Clear lungs. Minimal scoliosis.  IMPRESSION: No acute abnormality.   Electronically Signed   By: Gordan Payment M.D.   On: 08/12/2013 21:27     EKG Interpretation     Ventricular Rate:  97 PR Interval:  122 QRS Duration: 86 QT Interval:  364 QTC Calculation: 462 R Axis:   73 Text Interpretation:  Normal sinus rhythm Normal ECG            MDM   1. Chest pain   2. Vomiting   3. Hypokalemia   4. Left breast mass    PO K in ED for mild low K, hx of similar.  Mild qt elevation, similar to previous.  CP reproduceable, pt very low risk, clinically not cardiac, no PE risk factors. Exam with nurse showed mild swelling left anterior breast, mobile, no infectious signs. Interpreter used. Discussed close outpt fup, answered questions.    Results and differential diagnosis were discussed with the patient. Close follow up outpatient was discussed, patient comfortable with the plan.         Enid Skeens, MD 08/13/13 [phone]

## 2013-08-25 ENCOUNTER — Inpatient Hospital Stay (HOSPITAL_COMMUNITY)
Admission: AD | Admit: 2013-08-25 | Discharge: 2013-08-25 | Disposition: A | Discharge status: Home / Self Care | Payer: Self-pay | Source: Ambulatory Visit | Attending: Obstetrics & Gynecology | Admitting: Obstetrics & Gynecology

## 2013-08-25 ENCOUNTER — Encounter (HOSPITAL_COMMUNITY): Payer: Self-pay | Admitting: *Deleted

## 2013-08-25 ENCOUNTER — Inpatient Hospital Stay (HOSPITAL_COMMUNITY): Payer: Medicaid Other

## 2013-08-25 DIAGNOSIS — N63 Unspecified lump in unspecified breast: Secondary | ICD-10-CM | POA: Insufficient documentation

## 2013-08-25 DIAGNOSIS — M549 Dorsalgia, unspecified: Secondary | ICD-10-CM | POA: Insufficient documentation

## 2013-08-25 DIAGNOSIS — R300 Dysuria: Secondary | ICD-10-CM | POA: Insufficient documentation

## 2013-08-25 DIAGNOSIS — N6320 Unspecified lump in the left breast, unspecified quadrant: Secondary | ICD-10-CM

## 2013-08-25 DIAGNOSIS — N831 Corpus luteum cyst of ovary, unspecified side: Secondary | ICD-10-CM | POA: Insufficient documentation

## 2013-08-25 DIAGNOSIS — O34599 Maternal care for other abnormalities of gravid uterus, unspecified trimester: Secondary | ICD-10-CM | POA: Insufficient documentation

## 2013-08-25 DIAGNOSIS — R109 Unspecified abdominal pain: Secondary | ICD-10-CM | POA: Insufficient documentation

## 2013-08-25 DIAGNOSIS — O4690 Antepartum hemorrhage, unspecified, unspecified trimester: Secondary | ICD-10-CM

## 2013-08-25 DIAGNOSIS — O209 Hemorrhage in early pregnancy, unspecified: Secondary | ICD-10-CM | POA: Insufficient documentation

## 2013-08-25 LAB — URINALYSIS, ROUTINE W REFLEX MICROSCOPIC
Glucose, UA: NEGATIVE mg/dL
Ketones, ur: NEGATIVE mg/dL
Leukocytes, UA: NEGATIVE
Nitrite: NEGATIVE
Protein, ur: NEGATIVE mg/dL
Specific Gravity, Urine: 1.015 (ref 1.005–1.030)
Urobilinogen, UA: 0.2 mg/dL (ref 0.0–1.0)

## 2013-08-25 LAB — URINE MICROSCOPIC-ADD ON

## 2013-08-25 LAB — POCT PREGNANCY, URINE: Preg Test, Ur: POSITIVE — AB

## 2013-08-25 MED ORDER — SERTRALINE HCL 50 MG PO TABS: 50.0000 mg | ORAL_TABLET | Freq: Every day | ORAL | Status: DC

## 2013-08-25 NOTE — MAU Provider Note (Signed)
Attestation of Attending Supervision of Advanced Practitioner (CNM/NP): Evaluation and management procedures were performed by the Advanced Practitioner under my supervision and collaboration. I have reviewed the Advanced Practitioner's note and chart, and I agree with the management and plan.  Zaydn Gutridge H. 4:52 PM

## 2013-08-25 NOTE — MAU Note (Signed)
Patient presents with complaints of burning with urination X 1 month; a painful lump in her left breast X 3 weeks and back and abdominal pain X 2 weeks. Patient also admits to  bulimia X 3 years but has been diagnosed for only 2 years.

## 2013-08-25 NOTE — MAU Provider Note (Signed)
History     CSN: [id number]  Arrival date and time: 08/25/13 7829   First Provider Initiated Contact with Patient 08/25/13 1027      Chief Complaint  Patient presents with  . Abdominal Pain  . Back Pain  . Dysuria  . Breast Mass   HPI [name] 27 y.o.  8w 4d by LMP 06-26-13.  Client comes to MAU today with vaginal bleeding yesterday.  Today she has some lower abdominal cramping, back pain and a lump in her left breast.  Client has a history of bulemia and was recently put on medication for anxiety.  She is worried if the baby is OK and if she should continue taking her medication.  Has had some nausea and occasional vomiting.  She reports she has not had self induced vomiting.  She has not yet started prenatal care.    OB History   Grav Para Term Preterm Abortions TAB SAB Ect Mult Living   2 2 2       2       Past Medical History  Diagnosis Date  . Eating disorder   . Bulimia     Past Surgical History  Procedure Laterality Date  . No past surgeries      Family History  Problem Relation Age of Onset  . Heart disease Mother     History  Substance Use Topics  . Smoking status: Never Smoker   . Smokeless tobacco: Never Used  . Alcohol Use: No    Allergies: No Known Allergies  Prescriptions prior to admission  Medication Sig Dispense Refill  . ALPRAZolam (XANAX) 0.25 MG tablet Take 0.25 mg by mouth 3 (three) times daily as needed for anxiety.      . potassium chloride (MICRO-K) 10 MEQ CR capsule Take 10 mEq by mouth 2 (two) times daily.        Review of Systems  Constitutional: Negative for fever.  Gastrointestinal: Positive for nausea and abdominal pain. Negative for vomiting, diarrhea and constipation.  Genitourinary:       Vaginal bleeding yesterday. No vaginal discharge. No dysuria.  Musculoskeletal: Positive for back pain.   Physical Exam   Blood pressure 110/59, pulse 90, temperature 97.8 F (36.6 C), temperature source Oral, resp. rate 16,  height 5' 2.5" (1.588 m), weight 110 lb 4 oz (50.009 kg), last menstrual period 06/26/2013.  Physical Exam  Nursing note and vitals reviewed. Constitutional: She is oriented to person, place, and time. She appears well-developed and well-nourished.  HENT:  Head: Normocephalic.  Eyes: EOM are normal.  Neck: Neck supple.  GI: Soft. There is no tenderness. There is no rebound and no guarding.  Genitourinary:  Speculum exam: No blood in the vaginal. Cervix is closed. No abnormal discharge is seen. Bimanual  - cervix closed and thick.  Uterus enlarged - 8 week size. Chaperone present for exam.  Breast exam done.  On left breast at 12 oclock, more fullness palpated.  Approx 3 cm in diameter.  Unable to discern whether this is normal breast changes in pregnancy vs breast mass.  Musculoskeletal: Normal range of motion.  Neurological: She is alert and oriented to person, place, and time.  Skin: Skin is warm and dry.  Psychiatric: She has a normal mood and affect.    MAU Course  Procedures  MDM Client is Spanish speaking and interpreter present for all interactions with client.   CLINICAL DATA: Bleeding.  EXAM:  TRANSVAGINAL OB ULTRASOUND  TECHNIQUE:  Transvaginal ultrasound  was performed for complete evaluation of the  gestation as well as the maternal uterus, adnexal regions, and  pelvic cul-de-sac.  COMPARISON: 08/13/2013  FINDINGS:  Intrauterine gestational sac: Visualized/normal in shape.  Yolk sac: Visualized  Embryo: Visualize  Cardiac Activity: Visualized  Heart Rate: 142 bpm  MSD: mm w d  CRL: 10.3 mm 7 w 1 d [place] EDC: 04/12/2014  Maternal uterus/adnexae: Moderate sized subchorionic hemorrhage.  Left corpus luteum cyst. Trace free fluid in the pelvis.  IMPRESSION:  Seven week 1 day intrauterine pregnancy. Fetal heart rate 142 beats  per min. Moderate-sized subchorionic hemorrhage.    Assessment and Plan  Vaginal bleeding due to moderate subchorionic  hemorrhage 7w 1d gestation Breast mass  Plan Discussed subchorionic hemorrhage with client - interpreter present. Seek  Prenatal care - call the [facility] program Stop Xanax and start Zoloft 50 mg one po daily (#30) 1 refill.  Use for symptoms of anxiety.  Take daily. Advised that breast mass will be evaluated and followed once client is in prenatal care. Interpreter present to review these instructions with client.   Gennell How 08/25/2013, 12:48 PM

## 2013-09-24 ENCOUNTER — Inpatient Hospital Stay (HOSPITAL_COMMUNITY): Payer: Medicaid Other

## 2013-09-24 ENCOUNTER — Encounter (HOSPITAL_COMMUNITY): Payer: Self-pay

## 2013-09-24 ENCOUNTER — Inpatient Hospital Stay (HOSPITAL_COMMUNITY)
Admission: AD | Admit: 2013-09-24 | Discharge: 2013-09-24 | Disposition: A | Discharge status: Home / Self Care | Payer: Self-pay | Source: Ambulatory Visit | Attending: Obstetrics & Gynecology | Admitting: Obstetrics & Gynecology

## 2013-09-24 DIAGNOSIS — R109 Unspecified abdominal pain: Secondary | ICD-10-CM | POA: Insufficient documentation

## 2013-09-24 DIAGNOSIS — R5381 Other malaise: Secondary | ICD-10-CM | POA: Insufficient documentation

## 2013-09-24 DIAGNOSIS — O208 Other hemorrhage in early pregnancy: Secondary | ICD-10-CM | POA: Insufficient documentation

## 2013-09-24 DIAGNOSIS — R42 Dizziness and giddiness: Secondary | ICD-10-CM | POA: Insufficient documentation

## 2013-09-24 DIAGNOSIS — O418X10 Other specified disorders of amniotic fluid and membranes, first trimester, not applicable or unspecified: Secondary | ICD-10-CM

## 2013-09-24 DIAGNOSIS — N390 Urinary tract infection, site not specified: Secondary | ICD-10-CM | POA: Insufficient documentation

## 2013-09-24 DIAGNOSIS — O210 Mild hyperemesis gravidarum: Secondary | ICD-10-CM | POA: Insufficient documentation

## 2013-09-24 DIAGNOSIS — O209 Hemorrhage in early pregnancy, unspecified: Secondary | ICD-10-CM | POA: Insufficient documentation

## 2013-09-24 DIAGNOSIS — O2390 Unspecified genitourinary tract infection in pregnancy, unspecified trimester: Secondary | ICD-10-CM | POA: Insufficient documentation

## 2013-09-24 LAB — CBC
Hemoglobin: 12 g/dL (ref 12.0–15.0)
MCV: 86.5 fL (ref 78.0–100.0)
Platelets: 366 10*3/uL (ref 150–400)
RBC: 3.92 MIL/uL (ref 3.87–5.11)
RDW: 12.8 % (ref 11.5–15.5)
WBC: 9.6 10*3/uL (ref 4.0–10.5)

## 2013-09-24 LAB — URINALYSIS, ROUTINE W REFLEX MICROSCOPIC
Bilirubin Urine: NEGATIVE
Ketones, ur: 15 mg/dL — AB
Nitrite: POSITIVE — AB
Specific Gravity, Urine: 1.02 (ref 1.005–1.030)
pH: 7 (ref 5.0–8.0)

## 2013-09-24 LAB — URINE MICROSCOPIC-ADD ON

## 2013-09-24 MED ORDER — HYDROMORPHONE HCL PF 1 MG/ML IJ SOLN
0.5000 mg | Freq: Once | INTRAMUSCULAR | Status: AC
  Administered 2013-09-24: 0.5 mg via INTRAMUSCULAR
  Filled 2013-09-24: qty 1

## 2013-09-24 MED ORDER — CEPHALEXIN 500 MG PO CAPS: 500.0000 mg | ORAL_CAPSULE | Freq: Four times a day (QID) | ORAL | Status: DC

## 2013-09-24 NOTE — Discharge Instructions (Signed)
Hematoma subcorinico (Subchorionic Hematoma) Un hematoma subcorinico es una acumulacin de sangre entre la pared externa de la placenta y la pared interna del tero. La placenta es el rgano que conecta al feto con la pared del tero. La placenta ayuda a la respiracin (oxgeno al feto), alimentacin y elimina desperdicios (trabajo excretor) del feto. El HSC es la anormalidad ms comn que se encuentra en un ultrasonido que se realiza Physicist, medical o segundo trimestre del [place]. Si ha habido poca o ninguna hemorragia vaginal, los pequeos hematomas a menudo desaparecen solos y no afectan al beb o al Psychiatrist. La sangre se absorbe de manera gradual en alrededor de [employer]. Cuando la hemorragia aparece ms tarde en el embarazo, es mucha o aparece en un paciente mayor, el resultado podra no ser bueno. Los hematomas podran [employer], lo que incrementa las posibilidades de Marine scientist. El HSC tambin aumenta el riesgo de desprendimiento prematuro de la placenta del tero, trabajo de plazo antes de trmino (prematuro) y Secretary/administrator en el que nio nace muerto. CAUSAS No hay causa conocida de HSC. Sin embargo, puede ocurrir que en el momento en que los vulos fertilizados se implantan en el tero y se separan suavemente de l. SNTOMAS La mayora de las pacientes con un hematoma subcorinico pequeo en el primer trimestre no presentan problemas. Si los problemas aparecen, pueden ser:  Parto prematuro  Hemorragia vaginal sin dolor.  Dolor o retortijones en el vientre (abdomen) o pelvis. DIAGNSTICO La ecografa es el estudio de eleccin. Se puede realizar rpidamente junto a Education officer, community. Aunque no es una prueba perfecta y un nmero reducido de hematomas puede no verse, no conlleva riesgos.  INSTRUCCIONES PARA EL CUIDADO DOMICILIARIO  Aunque el reposo en cama no evitar la hemorragia o un aborto espontneo, su mdico puede recomendarlo.  El mdico podr pedirle que evite las cargas  pesadas (ms de 10 libras/5 kgs), haga ejercicios, tenga relaciones sexuales o utilice duchas vaginales.  Lleve un registro de la cantidad y la saturacin de las toallas higinicas que Landscape architect. Escriba esta informacin:  NO USE TAMPONES.  Su mdico puede pedirle que se realice anlisis de sangre y/o ecografas.  El mdico podr recomendarle Rho-gam si la madre es Rh negativo y el padre es Rh positivo. SOLICITE ATENCIN MDICA DE INMEDIATO SI :  Siente clicos intensos en el estmago, espalda, abdomen (vientre) o pelvis.  Presenta una temperatura oral superior a 100 F (37.8 C). Escriba esta informacin.  Elimina cogulos grandes o tejidos. Guarde lo que ha eliminado para que su mdico lo examine.  Si hemorragia aumenta o siente mareos, debilidad o tiene episodios de [address]. Document Released: 01/05/2009 Document Revised: 12/12/2011 [facility] Patient Information 2014 [place], [place]. Infeccin urinaria  (Urinary Tract Infection)  Una infeccin urinaria puede ocurrir en Corporate treasurer del tracto urinario. El tracto [employer] riones, urteres, la vejiga y Engineer, mining. La causa es un germen llamado bacteria. La infeccin urinaria mejora con antibiticos.  CUIDADOS EN EL HOGAR   Si le recetaron antibiticos, tmelos como le haya indicado el mdico. Tmelos todos, aunque se sienta mejor.  Beba gran cantidad de lquido para mantener el pis (orina) de tono claro o amarillo plido.  Evite el t, las bebidas con cafena y las bebidas gaseosas (carbonatada).  Orine con frecuencia. Evite retener la [employer].  Orine antes y despus de tener sexo (relaciones sexuales).  Si es [place], higiencese desde adelante hacia atrs despus de ir de cuerpo Conservation officer, nature  el intestino). Use slo un papel tissue por vez. SOLICITE AYUDA DE INMEDIATO SI:   Siente dolor en la espalda.  Siente un dolor en el vientre (abdominal) muy intenso.  Tiene escalofros.  Tiene Production manager (nuseas).  Vomita.  El ardor o las molestias al orinar no desaparecen.  Tiene fiebre.  Los sntomas no mejoran despus de [address]. ASEGRESE DE QUE:   Comprende estas instrucciones.  Controlar su enfermedad.  Solicitar ayuda de inmediato si no mejora o si empeora. Document Released: 03/09/2010 Document Revised: 06/13/2012 [facility] Patient Information 2014 [place], [place].

## 2013-09-24 NOTE — MAU Provider Note (Signed)
History     CSN: [id number]  Arrival date and time: 09/24/13 1806   First Provider Initiated Contact with Patient 09/24/13 2103      Chief Complaint  Patient presents with  . Abdominal Pain  . Vaginal Bleeding   HPI [name] is a 27 y.o. G3P2002 at [redacted]w[redacted]d who presents to MAU today with complaint of lower abdominal pain and vaginal bleeding since this evening. The patient has also had N/V most days throughout the pregnancy. The patient has occasional weakness and dizziness, but none now. She denies fever or UTI symptoms.   OB History   Grav Para Term Preterm Abortions TAB SAB Ect Mult Living   3 2 2       2       Past Medical History  Diagnosis Date  . Eating disorder   . Bulimia     Past Surgical History  Procedure Laterality Date  . No past surgeries      Family History  Problem Relation Age of Onset  . Heart disease Mother     History  Substance Use Topics  . Smoking status: Never Smoker   . Smokeless tobacco: Never Used  . Alcohol Use: No    Allergies: No Known Allergies  No prescriptions prior to admission    Review of Systems  Constitutional: Negative for fever and malaise/fatigue.  Gastrointestinal: Positive for nausea, vomiting, abdominal pain and constipation. Negative for diarrhea.  Genitourinary: Negative for dysuria, urgency and frequency.       + vaginal bleeding  Neurological: Positive for dizziness and weakness. Negative for loss of consciousness.   Physical Exam   Blood pressure 112/73, pulse 95, temperature 98.3 F (36.8 C), temperature source Oral, resp. rate 16, height 5' 1.5" (1.562 m), weight 112 lb 12.8 oz (51.166 kg), last menstrual period 06/26/2013, SpO2 100.00%.  Physical Exam  Constitutional: She is oriented to person, place, and time. She appears well-developed and well-nourished. No distress.  HENT:  Head: Normocephalic and atraumatic.  Cardiovascular: Normal rate, regular rhythm and normal heart sounds.    Respiratory: Effort normal and breath sounds normal. No respiratory distress.  GI: Soft. Bowel sounds are normal. She exhibits no distension and no mass. There is tenderness (moderate tenderness to palpation of the lower abdomen at midline). There is no rebound and no guarding.  Genitourinary: Uterus is enlarged (appropriate for GA) and tender (moderate tenderness). Cervix exhibits no motion tenderness, no discharge and no friability. Right adnexum displays no mass and no tenderness. Left adnexum displays no mass and no tenderness. There is bleeding (moderate amount of red blood noted) around the vagina. Vaginal discharge (small amount of mucus discharge noted) found.  Neurological: She is alert and oriented to person, place, and time.  Skin: Skin is warm and dry. No erythema.  Psychiatric: She has a normal mood and affect.  Cervix: closed, long, thick  Results for orders placed during the hospital encounter of 09/24/13 (from the past 24 hour(s))  URINALYSIS, ROUTINE W REFLEX MICROSCOPIC     Status: Abnormal   Collection Time    09/24/13  7:03 PM      Result Value Range   Color, Urine RED (*) YELLOW   APPearance CLOUDY (*) CLEAR   Specific Gravity, Urine 1.020  1.005 - 1.030   pH 7.0  5.0 - 8.0   Glucose, UA NEGATIVE  NEGATIVE mg/dL   Hgb urine dipstick LARGE (*) NEGATIVE   Bilirubin Urine NEGATIVE  NEGATIVE   Ketones, ur  15 (*) NEGATIVE mg/dL   Protein, ur >161 (*) NEGATIVE mg/dL   Urobilinogen, UA 0.2  0.0 - 1.0 mg/dL   Nitrite POSITIVE (*) NEGATIVE   Leukocytes, UA TRACE (*) NEGATIVE  URINE MICROSCOPIC-ADD ON     Status: Abnormal   Collection Time    09/24/13  7:03 PM      Result Value Range   Squamous Epithelial / LPF FEW (*) RARE   WBC, UA 0-2  <3 WBC/hpf   RBC / HPF TOO NUMEROUS TO COUNT  <3 RBC/hpf   Bacteria, UA RARE  RARE  CBC     Status: Abnormal   Collection Time    09/24/13  9:35 PM      Result Value Range   WBC 9.6  4.0 - 10.5 K/uL   RBC 3.92  3.87 - 5.11 MIL/uL    Hemoglobin 12.0  12.0 - 15.0 g/dL   HCT 09.6 (*) 04.5 - 40.9 %   MCV 86.5  78.0 - 100.0 fL   MCH 30.6  26.0 - 34.0 pg   MCHC 35.4  30.0 - 36.0 g/dL   RDW 81.1  91.4 - 78.2 %   Platelets 366  150 - 400 K/uL   US Ob Comp Less 14 Wks  09/24/2013   CLINICAL DATA:  Lower abdominal pain and vaginal bleeding.  EXAM: OBSTETRIC <14 WK ULTRASOUND  TECHNIQUE: Transabdominal ultrasound was performed for evaluation of the gestation as well as the maternal uterus and adnexal regions.  COMPARISON:  Pelvic ultrasound performed 08/25/2013  FINDINGS: Intrauterine gestational sac: Visualized/normal in shape.  Yolk sac:  No  Embryo:  Yes  Cardiac Activity: Yes  Heart Rate: 155 bpm  CRL:   5.33 cm   12 w 0 d                  [place] EDC: 04/08/2014  Maternal uterus/adnexae: A small amount of subchorionic hemorrhage is noted. The uterus is otherwise unremarkable in appearance.  The ovaries are within normal limits. The right ovary measures 2.6 x 2.0 x 1.4 cm, while the left ovary measures 4.8 x 3.1 x 3.3 cm. No suspicious adnexal masses are seen; there is no evidence for ovarian torsion.  No free fluid is seen within the pelvic cul-de-sac.  IMPRESSION: 1. Single live intrauterine pregnancy noted, with a crown-rump length of 5.3 cm, corresponding to a gestational age of [redacted] weeks 0 days. This matches the gestational age of [redacted] weeks 6 days by LMP, reflecting an estimated date of delivery of April 02, 2014. 2. Small amount of subchorionic hemorrhage noted.   Electronically Signed   By: Roanna Raider M.D.   On: 09/24/2013 23:03    MAU Course  Procedures None  MDM FHR - 158 bpm, RN reports that FHTs were auscultated low in the pelvis for GA CBC and [place] today O+ blood type from previous visit Spanish interpreter present Assessment and Plan  A: UTI Small subchorionic hemorrhage  P: Discharge home Rx for Keflex sent to patient's pharmacy Bleeding precautions discussed Pelvic rest advised Warning signs for  Pyelonephritis discussed Patient to keep scheduled appointment to start prenatal care with [facility] Patient may return to MAU as needed or if her condition were to change or worsen  Freddi Starr, PA-C  09/25/2013, 12:47 AM

## 2013-09-24 NOTE — MAU Note (Signed)
Patient states she has had sudden onset of heavy bleeding then started having abdominal pain. Has had nausea and vomiting all week, able to keep some food down.

## 2013-09-26 LAB — CULTURE, OB URINE
Colony Count: NO GROWTH
Culture: NO GROWTH

## 2013-09-30 NOTE — MAU Provider Note (Signed)
Attestation of Attending Supervision of Advanced Practitioner (PA/CNM/NP): Evaluation and management procedures were performed by the Advanced Practitioner under my supervision and collaboration.  I have reviewed the Advanced Practitioner's note and chart, and I agree with the management and plan.  Uday Jantz, MD, FACOG Attending Obstetrician & Gynecologist Faculty Practice, [facility]  

## 2013-10-03 NOTE — L&D Delivery Note (Signed)
Delivery Note At 5:22 PM a viable female was delivered via Vaginal, Spontaneous Delivery (Presentation: ROA; Occiput Anterior).  APGAR: 9, 9; weight pending .   Placenta status: Intact, Spontaneous.  Cord: 3 vessels with the following complications: 1st degree laceration  Anesthesia: None  Episiotomy: None Lacerations: Periurethral; Suture Repair: 4.0 monocryl Est. Blood Loss (mL): 300  Mom to postpartum.  Baby to Couplet care / Skin to Skin.  Called to delivery. Mother pushed over 20 min. Infant delivered to maternal abdomen. Delayed cord clamping performed. Cord clamped and cut. Active management of 3rd stage with traction and Pitocin. Placenta delivered intact with 3v cord. Tear repaired with 4.0 monocryl on CT in usual manner. [id number]. Counts correct. Hemostatic.    [name], Caleb G 04/12/2014, 6:17 PM  Pt with some PP bleeding. rec'd cytotec 800mcg and methergine 0.2. Improved bleeding  I was present for the delivery of this patient and I repaired the tear and agree with resident's note and plan of care.  Tawana ScaleMichael Ryan Srihitha Tagliaferri, MD OB Fellow 04/12/2014 7:59 PM

## 2013-10-10 LAB — OB RESULTS CONSOLE HIV ANTIBODY (ROUTINE TESTING): HIV: NONREACTIVE

## 2013-10-10 LAB — OB RESULTS CONSOLE ANTIBODY SCREEN: ANTIBODY SCREEN: NEGATIVE

## 2013-10-10 LAB — OB RESULTS CONSOLE ABO/RH: RH Type: POSITIVE

## 2013-10-10 LAB — OB RESULTS CONSOLE RPR: RPR: NONREACTIVE

## 2013-10-10 LAB — OB RESULTS CONSOLE GC/CHLAMYDIA
CHLAMYDIA, DNA PROBE: NEGATIVE
GC PROBE AMP, GENITAL: NEGATIVE

## 2013-10-10 LAB — OB RESULTS CONSOLE HEPATITIS B SURFACE ANTIGEN: Hepatitis B Surface Ag: NEGATIVE

## 2013-10-10 LAB — OB RESULTS CONSOLE RUBELLA ANTIBODY, IGM: Rubella: IMMUNE

## 2013-11-05 ENCOUNTER — Other Ambulatory Visit (HOSPITAL_COMMUNITY): Payer: Self-pay | Admitting: Nurse Practitioner

## 2013-11-05 DIAGNOSIS — Z3689 Encounter for other specified antenatal screening: Secondary | ICD-10-CM

## 2013-11-14 ENCOUNTER — Inpatient Hospital Stay (HOSPITAL_COMMUNITY)
Admission: AD | Admit: 2013-11-14 | Discharge: 2013-11-14 | Disposition: A | Discharge status: Home / Self Care | Payer: Medicaid Other | Source: Ambulatory Visit | Attending: Family Medicine | Admitting: Family Medicine

## 2013-11-14 ENCOUNTER — Encounter (HOSPITAL_COMMUNITY): Payer: Self-pay | Admitting: *Deleted

## 2013-11-14 DIAGNOSIS — R109 Unspecified abdominal pain: Secondary | ICD-10-CM | POA: Insufficient documentation

## 2013-11-14 DIAGNOSIS — B373 Candidiasis of vulva and vagina: Secondary | ICD-10-CM

## 2013-11-14 DIAGNOSIS — O2390 Unspecified genitourinary tract infection in pregnancy, unspecified trimester: Secondary | ICD-10-CM | POA: Insufficient documentation

## 2013-11-14 DIAGNOSIS — R0789 Other chest pain: Secondary | ICD-10-CM

## 2013-11-14 DIAGNOSIS — E876 Hypokalemia: Secondary | ICD-10-CM | POA: Insufficient documentation

## 2013-11-14 DIAGNOSIS — B3731 Acute candidiasis of vulva and vagina: Secondary | ICD-10-CM

## 2013-11-14 DIAGNOSIS — O99891 Other specified diseases and conditions complicating pregnancy: Secondary | ICD-10-CM | POA: Insufficient documentation

## 2013-11-14 DIAGNOSIS — R079 Chest pain, unspecified: Secondary | ICD-10-CM | POA: Insufficient documentation

## 2013-11-14 DIAGNOSIS — O9989 Other specified diseases and conditions complicating pregnancy, childbirth and the puerperium: Secondary | ICD-10-CM

## 2013-11-14 DIAGNOSIS — O26899 Other specified pregnancy related conditions, unspecified trimester: Secondary | ICD-10-CM

## 2013-11-14 LAB — COMPREHENSIVE METABOLIC PANEL
ALBUMIN: 3 g/dL — AB (ref 3.5–5.2)
ALK PHOS: 54 U/L (ref 39–117)
ALT: 9 U/L (ref 0–35)
AST: 20 U/L (ref 0–37)
BUN: 8 mg/dL (ref 6–23)
CHLORIDE: 101 meq/L (ref 96–112)
CO2: 23 mEq/L (ref 19–32)
Calcium: 9.2 mg/dL (ref 8.4–10.5)
Creatinine, Ser: 0.49 mg/dL — ABNORMAL LOW (ref 0.50–1.10)
GFR calc Af Amer: >90 mL/min (ref >90)
GFR calc non Af Amer: >90 mL/min (ref >90)
Glucose, Bld: 89 mg/dL (ref 70–99)
POTASSIUM: 3.9 meq/L (ref 3.7–5.3)
Sodium: 135 mEq/L — ABNORMAL LOW (ref 137–147)
Total Bilirubin: <0.2 mg/dL — ABNORMAL LOW (ref 0.3–1.2)
Total Protein: 6.8 g/dL (ref 6.0–8.3)

## 2013-11-14 LAB — URINALYSIS, ROUTINE W REFLEX MICROSCOPIC
Bilirubin Urine: NEGATIVE
Glucose, UA: NEGATIVE mg/dL
Hgb urine dipstick: NEGATIVE
KETONES UR: NEGATIVE mg/dL
LEUKOCYTES UA: NEGATIVE
NITRITE: NEGATIVE
Protein, ur: NEGATIVE mg/dL
Specific Gravity, Urine: 1.015 (ref 1.005–1.030)
Urobilinogen, UA: 0.2 mg/dL (ref 0.0–1.0)
pH: 7 (ref 5.0–8.0)

## 2013-11-14 LAB — WET PREP, GENITAL
Clue Cells Wet Prep HPF POC: NONE SEEN
Trich, Wet Prep: NONE SEEN

## 2013-11-14 LAB — CBC WITH DIFFERENTIAL/PLATELET
Basophils Absolute: 0 10*3/uL (ref 0.0–0.1)
Basophils Relative: 0 % (ref 0–1)
Eosinophils Absolute: 0.1 10*3/uL (ref 0.0–0.7)
Eosinophils Relative: 1 % (ref 0–5)
HCT: 32.4 % — ABNORMAL LOW (ref 36.0–46.0)
HEMOGLOBIN: 11.4 g/dL — AB (ref 12.0–15.0)
Lymphocytes Relative: 21 % (ref 12–46)
Lymphs Abs: 2 10*3/uL (ref 0.7–4.0)
MCH: 30.6 pg (ref 26.0–34.0)
MCHC: 35.2 g/dL (ref 30.0–36.0)
MCV: 87.1 fL (ref 78.0–100.0)
Monocytes Absolute: 0.8 10*3/uL (ref 0.1–1.0)
Monocytes Relative: 8 % (ref 3–12)
NEUTROS ABS: 6.7 10*3/uL (ref 1.7–7.7)
NEUTROS PCT: 70 % (ref 43–77)
Platelets: 320 10*3/uL (ref 150–400)
RBC: 3.72 MIL/uL — ABNORMAL LOW (ref 3.87–5.11)
RDW: 12.9 % (ref 11.5–15.5)
WBC: 9.5 10*3/uL (ref 4.0–10.5)

## 2013-11-14 MED ORDER — ACETAMINOPHEN 325 MG PO TABS
650.0000 mg | ORAL_TABLET | Freq: Once | ORAL | Status: AC
  Administered 2013-11-14: 650 mg via ORAL
  Filled 2013-11-14: qty 2

## 2013-11-14 MED ORDER — GI COCKTAIL ~~LOC~~
30.0000 mL | Freq: Once | ORAL | Status: AC
  Administered 2013-11-14: 30 mL via ORAL
  Filled 2013-11-14: qty 30

## 2013-11-14 MED ORDER — FLUCONAZOLE 150 MG PO TABS: 150.0000 mg | ORAL_TABLET | Freq: Every day | ORAL | Status: DC

## 2013-11-14 MED ORDER — OMEPRAZOLE 20 MG PO CPDR: 20.0000 mg | DELAYED_RELEASE_CAPSULE | Freq: Every day | ORAL | Status: DC

## 2013-11-14 NOTE — MAU Note (Addendum)
Pt reports feleing chest pressure when she takes a deep breath in and her heart racing at times with palpitations. C/O Nausea and vomiting and some abd cramping

## 2013-11-14 NOTE — MAU Provider Note (Signed)
History     CSN: [id number]  Arrival date and time: 11/14/13 1701   None     Chief Complaint  Patient presents with  . Pleurisy  . Nausea  . Emesis  . Palpitations   Emesis  Associated symptoms include abdominal pain, chest pain, chills, dizziness and headaches. Pertinent negatives include no coughing, diarrhea or fever.  Palpitations  Associated symptoms include chest pain, dizziness, malaise/fatigue, nausea and vomiting. Pertinent negatives include no coughing, fever or shortness of breath.   [name] is a 28 y.o. G3P2002 at [redacted]w[redacted]d who presents with c/o lower abdominal pain x 2 days and chest pain since yesterday. She describes the lower abdominal pain as an intermittent cramp that occurs after voiding and rates it a 10/10. She describes the chest pain as a constant pressure-like sensation over the left side of her chest with radiation down her left arm and rates it a 10/10. She states that she was seen in the ED before for the same chest pain and was diagnosed with hypokalemia and prescribed potassium. She reports nausea, vomiting (2x yesterday), HA, dizziness, dysuria (burning) and back pain. She denies SOB, urinary frequency, diarrhea, constipation, vaginal bleeding/discharge, LOF, or contractions.        OB History   Grav Para Term Preterm Abortions TAB SAB Ect Mult Living   3 2 2       2       Past Medical History  Diagnosis Date  . Eating disorder   . Bulimia     Past Surgical History  Procedure Laterality Date  . No past surgeries      Family History  Problem Relation Age of Onset  . Heart disease Mother     History  Substance Use Topics  . Smoking status: Never Smoker   . Smokeless tobacco: Never Used  . Alcohol Use: No    Allergies: No Known Allergies  Prescriptions prior to admission  Medication Sig Dispense Refill  . acetaminophen (TYLENOL) 500 MG tablet Take 500 mg by mouth daily as needed for mild pain.      . potassium chloride (MICRO-K)  10 MEQ CR capsule Take 10 mEq by mouth 2 (two) times daily.      . Prenatal Vit-Fe Fumarate-FA (PRENATAL MULTIVITAMIN) TABS tablet Take 1 tablet by mouth daily at 12 noon.      . cephALEXin (KEFLEX) 500 MG capsule Take 1 capsule (500 mg total) by mouth 4 (four) times daily.  20 capsule  0    Review of Systems  Constitutional: Positive for chills and malaise/fatigue. Negative for fever.  HENT: Negative for tinnitus.   Eyes: Negative for blurred vision.  Respiratory: Negative for cough, hemoptysis and shortness of breath.   Cardiovascular: Positive for chest pain and palpitations. Negative for leg swelling.       CP w/ radiation down left arm  Gastrointestinal: Positive for nausea, vomiting and abdominal pain. Negative for diarrhea, constipation and blood in stool.       Non-bloody emesis x 2, lower abdominal pain  Genitourinary: Positive for dysuria. Negative for frequency and hematuria.       Burning sensation when voiding  Musculoskeletal: Positive for back pain.  Neurological: Positive for dizziness and headaches.   Physical Exam   Blood pressure 111/64, pulse 87, temperature 98.3 F (36.8 C), temperature source Oral, resp. rate 18, height 5' 1.5" (1.562 m), weight 59.421 kg (131 lb), last menstrual period 06/26/2013, SpO2 100.00%.  Physical Exam General: Well-developed, well-nourished, not in  acute distress.  Head: Normocephalic.  Eyes: Conjunctivae and EOM normal. No discharge bilaterally. Throat: Oropharynx clear and moist. No exudates. CV: Normal RRR. No gallop, friction rub, or murmurs. 2+ distal pulses.   Respiratory: Normal breath sounds bilaterally. No wheezes, rales, or rhonchi. GI: Mild tenderness upon palpation in lower abdomen. Abdomen soft w/ no distention. Active bowel sounds in all quadrants. No CVAT. MSK: Left sided chest wall tenderness upon palpation. No edema.  Neuro: Alert and oriented x 3. Skin: Warm, no rash or erythema.  Psych: Normal mood and affect.  Speech and behavior normal.     Pelvic exam: External genitalia normal. No lesions. Mucous membrane moist. Scant white and mucus discharge in vaginal vault. Cervix appearance normal. No CMT. No adnexal tenderness or mass. Cervical os closed. Uterus appropriate size for GA.      Results for orders placed during the hospital encounter of 11/14/13 (from the past 24 hour(s))  URINALYSIS, ROUTINE W REFLEX MICROSCOPIC     Status: None   Collection Time    11/14/13  5:50 PM      Result Value Ref Range   Color, Urine YELLOW  YELLOW   APPearance CLEAR  CLEAR   Specific Gravity, Urine 1.015  1.005 - 1.030   pH 7.0  5.0 - 8.0   Glucose, UA NEGATIVE  NEGATIVE mg/dL   Hgb urine dipstick NEGATIVE  NEGATIVE   Bilirubin Urine NEGATIVE  NEGATIVE   Ketones, ur NEGATIVE  NEGATIVE mg/dL   Protein, ur NEGATIVE  NEGATIVE mg/dL   Urobilinogen, UA 0.2  0.0 - 1.0 mg/dL   Nitrite NEGATIVE  NEGATIVE   Leukocytes, UA NEGATIVE  NEGATIVE  CBC WITH DIFFERENTIAL     Status: Abnormal   Collection Time    11/14/13  6:40 PM      Result Value Ref Range   WBC 9.5  4.0 - 10.5 K/uL   RBC 3.72 (*) 3.87 - 5.11 MIL/uL   Hemoglobin 11.4 (*) 12.0 - 15.0 g/dL   HCT 91.432.4 (*) 78.236.0 - 95.646.0 %   MCV 87.1  78.0 - 100.0 fL   MCH 30.6  26.0 - 34.0 pg   MCHC 35.2  30.0 - 36.0 g/dL   RDW 21.312.9  08.611.5 - 57.815.5 %   Platelets 320  150 - 400 K/uL   Neutrophils Relative % 70  43 - 77 %   Neutro Abs 6.7  1.7 - 7.7 K/uL   Lymphocytes Relative 21  12 - 46 %   Lymphs Abs 2.0  0.7 - 4.0 K/uL   Monocytes Relative 8  3 - 12 %   Monocytes Absolute 0.8  0.1 - 1.0 K/uL   Eosinophils Relative 1  0 - 5 %   Eosinophils Absolute 0.1  0.0 - 0.7 K/uL   Basophils Relative 0  0 - 1 %   Basophils Absolute 0.0  0.0 - 0.1 K/uL  COMPREHENSIVE METABOLIC PANEL     Status: Abnormal   Collection Time    11/14/13  6:40 PM      Result Value Ref Range   Sodium 135 (*) 137 - 147 mEq/L   Potassium 3.9  3.7 - 5.3 mEq/L   Chloride 101  96 - 112 mEq/L   CO2 23   19 - 32 mEq/L   Glucose, Bld 89  70 - 99 mg/dL   BUN 8  6 - 23 mg/dL   Creatinine, Ser 4.690.49 (*) 0.50 - 1.10 mg/dL   Calcium 9.2  8.4 -  10.5 mg/dL   Total Protein 6.8  6.0 - 8.3 g/dL   Albumin 3.0 (*) 3.5 - 5.2 g/dL   AST 20  0 - 37 U/L   ALT 9  0 - 35 U/L   Alkaline Phosphatase 54  39 - 117 U/L   Total Bilirubin <0.2 (*) 0.3 - 1.2 mg/dL   GFR calc non Af Amer >90  >90 mL/min   GFR calc Af Amer >90  >90 mL/min      MAU Course  Procedures  MDM 1. FHR was 155 bpm 2. UA unremarkable 3. CBC normal  4. CMP normal  5. EKG unchanged from prior 6. Pelvic exam Dr. Shawnie Pons consulted. Patient needs Rx for Prilosec.  Assessment and Plan  [name] is a 28 y.o. G3P2002 at 100w5d who presents with c/o lower abdominal pain x 2 days and chest pain since yesterday. Physical exam demonstrated mild lower abdomen tenderness but patient denies vaginal bleeding, LOF, and contraction so spontaneous abortion is unlikely. FHR was 155 bpm in the MAU which is also reassuring. Will order CBC and UA to r/o infection.    CP: Physical exam demonstrated left sided chest wall tenderness. Patient admits to same pain before and was told by ED it was caused by hypokalemia. Will order EKG and CMP.     [place], Moneta, T 11/14/2013, 6:33 PM   A: Chest wall pain Yeast vulvovaginitis Abdominal pain in pregnancy, antepartum  P: Discharge home Rx for Prilosec and Diflucan sent to patient's pharmacy Patient advised to use Tylenol PRN pain RICE discussed for chest wall pain Patient encouraged to follow-up with [facility] as scheduled for routine prenatal care Patient may return to MAU as needed or if her condition were to change or worsen. Dicussed that if chest pain worsens patient should go to [facility]  I have seen and evaluated the patient with the PA student. I agree with the assessment and plan as written above.   Freddi Starr, PA-C 11/14/2013 8:48 PM

## 2013-11-14 NOTE — Progress Notes (Signed)
Pt states she has been having headaches in the morning since she became pregnant

## 2013-11-14 NOTE — MAU Note (Signed)
Pt vomiting  Attack pt has a history of bulemia

## 2013-11-14 NOTE — Progress Notes (Signed)
Pt states she doesn't have anymore medication for anxiety. Pt states she has not taken anything since Monday.

## 2013-11-14 NOTE — Discharge Instructions (Signed)
Acidez gstrica durante el embarazo (Heartburn During Pregnancy)  La acidez se produce cuando el cido del estmago sube hacia el esfago. El esfago es el conducto que une la boca con el [place] Colony[place]mago. Este cido causa un dolor que quema en el pecho o la garganta. Esto ocurre ms frecuentemente en la etapa final del embarazo porque el vientre (tero) se agranda. Tambin puede producirse debido a cambios hormonales. Este problema generalmente desaparece despus del parto. CUIDADOS EN EL HOGAR  Tome todos los medicamentos segn las indicaciones de su mdico.  Eleve la cabecera de la cama con bloques segn le indique el mdico.  No haga ejercicios inmediatamente despus de comer.  Evite comer Progress Energyentre dos y treshoras antes de ir a Teacher, musicacostarse. No se acueste inmediatamente despus de comer.  Haga comidas pequeas durante Glass blower/designerel da en lugar de 3 comidas abundantes.  Evite los alimentos que le hagan mal. Los alimentos que debe evitar son:  [place] CenterPimienta.  Chocolate.  Alimentos con alto contenido de grasas, incluyendo las comidas fritas  Comidas muy condimentadas.  Ajo y Texas Instrumentscebolla  Ctricos, como naranjas, pomelos, limones y limas.  Alimentos o productos que CSX Corporationcontengan tomate.  Menta.  Bebidas gaseosas (carbonatadas) y las que contengan cafena.  Vinagre SOLICITE AYUDA SI:  Siente dolor en el estmago (abdominal).  Siente ardor en la parte superior del estmago o el pecho, especialmente despus de comer o recostarse.  Tiene Programme researcher, broadcasting/film/videomalestar estomacal (nuseas) y vomita.  Tiene malestar estomacal despus de comer. SOLICITE AYUDA DE INMEDIATO SI:  Siente un dolor intenso en el pecho que baja por el brazo o va hacia la mandbula o el cuello.  Se siente mareado o sufre un desmayo.  Tiene dificultad para respirar.  Vomita sangre.  Tiene dificultad o dolor al tragar.  La materia fecal es negra o tiene [place]angre.  Tiene acidez ms de 3 veces por semana, durante ms de 2 semanas. ASEGRESE DE  QUE:  Comprende estas instrucciones.  Controlar su afeccin.  Recibir ayuda de inmediato si no mejora o si empeora. Document Released: 01/04/2011 Document Revised: 07/10/2013 [facility] Patient Information 2014 [place], [place]. Vaginitis monilisica (Monilial Vaginitis) La vaginitis es una inflamacin (irritacin, hinchazn) de la vagina y la vulva. Esta no es una enfermedad de transmisin sexual.  CAUSAS Este tipo de vaginitis lo causa un hongo (candida) que normalmente se encuentra en la vagina. El hongo candida se ha desarrollado hasta el punto de ocasionar problemas en el equilibrio qumico. SNTOMAS  Secrecin vaginal espesa y blanca.  Hinchazn, picazn, enrojecimiento e inflamacin de la vagina y en algunos casos de los labios vaginales (vulva).  Ardor o dolor al ConocoPhillipsorinar.  Dolor en las relaciones sexuales. DIAGNSTICO Los factores que favorecen la vaginitis moniliasica son:  Everlean Pattersontapas de virginidad y postmenopusicas.  Embarazo.  Infecciones.  Sentir cansancio, estar enferma o estresada, especialmente si ya ha sufrido este problema en el pasado.  Diabetes Buen control ayudar a disminur la probabilidad.  Pldoras anticonceptivas  Ropa interior [place] Islandsmuy ajustada.  El uso de espumas de bao, aerosoles femeninos duchas vaginales o tampones con desodorante.  Algunos antibiticos (medicamentos que destruyen grmenes).  Si contrae alguna enfermedad puede sufrir recurrencias espordicas. TRATAMIENTO El profesional que lo asiste prescribir medicamentos.  Hay diferentes tipos de cremas y supositorios vaginales que tratan especficamente la vaginitis monilisica. Para infecciones por hongos recurrentes, utilice un supositorio o crema en la vagina dos veces por semana, o segn se le indique.  Tambin podrn utilizarse cremas con corticoides o anti monilisicas para la picazn o la  irritacin de la vulva. Consulte con el profesional que la asiste.  Si la crema no da resultado,  podr aplicarse en la vagina una solucin con azul de metileno.  El consumo de yogur puede prevenir este tipo de vaginitis. INSTRUCCIONES PARA EL CUIDADO DOMICILIARIO  Tome todos los medicamentos tal como se le indic.  No mantenga relaciones sexuales hasta que el tratamiento se haya completado, o segn las indicaciones del profesional que la asiste.  Tome baos de asiento tibios.  No se aplique duchas vaginales.  No utilice tampones, especialmente los perfumados.  Use ropa interior de algodn  Mirant pantalones ajustados y las medias tipo panty.  Comunique a sus compaeros sexuales que sufre una infeccin por hongos. Ellos deben concurrir para un control mdico si tienen sntomas como una urticaria leve o picazn.  Sus compaeros sexuales deben tratarse tambin si la infeccin es difcil de Pharmacologist.  Practique el sexo seguro  use condones  Algunos medicamentos vaginales ocasionan fallas en los condones de ltex. Los medicamentos vaginales que pueden daar los condones son:  Chiropodist cleocina  Butoconazole (Femstat)  Terconazole (Terazol) supositorios vaginales  Miconazole (Monistat) (es un medicamento de venta libre) SOLICITE ATENCIN MDICA SI:  Daphane Shepherd tiene una temperatura oral de ms de 38,9 C (102 F).  Si la infeccin empeora luego de 2 [address].  Si la infeccin no mejora luego de 3 [address].  Aparecen ampollas en o alrededor de la vagina.  Si aparece una hemorragia vaginal y no es el momento del perodo.  Siente dolor al ConocoPhillips.  Presenta problemas intestinales.  Tiene dolor durante las [employer]. Document Released: 06/29/2005 Document Revised: 12/12/2011 [facility] Patient Information 2014 [place], [place].

## 2013-11-14 NOTE — MAU Note (Signed)
Pt states she is "having pain in her chest" that started yesterday. and abdomine hurts, and pt states sheis dizzy and has been throwing up a lot and sometimes she gets chills and her body hurts. Pt states chills started MOnday.

## 2013-11-14 NOTE — MAU Provider Note (Signed)
Attestation of Attending Supervision of Advanced Practitioner (PA/CNM/NP): Evaluation and management procedures were performed by the Advanced Practitioner under my supervision and collaboration.  I have reviewed the Advanced Practitioner's note and chart, and I agree with the management and plan.  Reva BoresPRATT,Lynzee Lindquist S, MD [facility] Attending 11/14/2013 11:10 PM

## 2013-11-15 LAB — GC/CHLAMYDIA PROBE AMP
CT PROBE, AMP APTIMA: NEGATIVE
GC PROBE AMP APTIMA: NEGATIVE

## 2013-11-19 ENCOUNTER — Ambulatory Visit (HOSPITAL_COMMUNITY)
Admission: RE | Admit: 2013-11-19 | Discharge: 2013-11-19 | Disposition: A | Discharge status: Home / Self Care | Payer: Medicaid Other | Source: Ambulatory Visit | Attending: Nurse Practitioner | Admitting: Nurse Practitioner

## 2013-11-19 DIAGNOSIS — Z3689 Encounter for other specified antenatal screening: Secondary | ICD-10-CM | POA: Insufficient documentation

## 2013-12-05 ENCOUNTER — Inpatient Hospital Stay (HOSPITAL_COMMUNITY)
Admission: AD | Admit: 2013-12-05 | Discharge: 2013-12-05 | Disposition: A | Discharge status: Home / Self Care | Payer: Self-pay | Source: Ambulatory Visit | Attending: Obstetrics & Gynecology | Admitting: Obstetrics & Gynecology

## 2013-12-05 ENCOUNTER — Encounter (HOSPITAL_COMMUNITY): Payer: Self-pay | Admitting: *Deleted

## 2013-12-05 DIAGNOSIS — M549 Dorsalgia, unspecified: Secondary | ICD-10-CM | POA: Insufficient documentation

## 2013-12-05 DIAGNOSIS — N949 Unspecified condition associated with female genital organs and menstrual cycle: Secondary | ICD-10-CM

## 2013-12-05 DIAGNOSIS — O368190 Decreased fetal movements, unspecified trimester, not applicable or unspecified: Secondary | ICD-10-CM | POA: Insufficient documentation

## 2013-12-05 DIAGNOSIS — R109 Unspecified abdominal pain: Secondary | ICD-10-CM | POA: Insufficient documentation

## 2013-12-05 LAB — URINALYSIS, ROUTINE W REFLEX MICROSCOPIC
Bilirubin Urine: NEGATIVE
Glucose, UA: NEGATIVE mg/dL
Hgb urine dipstick: NEGATIVE
Ketones, ur: NEGATIVE mg/dL
Leukocytes, UA: NEGATIVE
Nitrite: NEGATIVE
Protein, ur: NEGATIVE mg/dL
SPECIFIC GRAVITY, URINE: 1.02 (ref 1.005–1.030)
UROBILINOGEN UA: 0.2 mg/dL (ref 0.0–1.0)
pH: 5.5 (ref 5.0–8.0)

## 2013-12-05 NOTE — MAU Provider Note (Signed)
History     CSN: [id number]  Arrival date and time: 12/05/13 1540   First Provider Initiated Contact with Patient 12/05/13 1748      Chief Complaint  Patient presents with  . Decreased Fetal Movement   HPI  For 2 days Ms. Gorniak has noticed reduced fetal movements and had intermittent pain in her flanks and pubic area. No vaginal bleeding, contractions, loss of fluid. Decreased fetal movements.  Not associated with any movement, activity, or time of day. No nausea, vomiting, or other associated symptoms.   Past Medical History  Diagnosis Date  . Eating disorder   . Bulimia     Past Surgical History  Procedure Laterality Date  . No past surgeries      Family History  Problem Relation Age of Onset  . Heart disease Mother     History  Substance Use Topics  . Smoking status: Never Smoker   . Smokeless tobacco: Never Used  . Alcohol Use: No    Allergies: No Known Allergies  Prescriptions prior to admission  Medication Sig Dispense Refill  . acetaminophen (TYLENOL) 500 MG tablet Take 1,000 mg by mouth daily as needed for mild pain or headache.       Marland Kitchen omeprazole (PRILOSEC) 20 MG capsule Take 1 capsule (20 mg total) by mouth daily.  30 capsule  0  . potassium chloride (MICRO-K) 10 MEQ CR capsule Take 10 mEq by mouth 2 (two) times daily.      . Prenatal Vit-Fe Fumarate-FA (PRENATAL MULTIVITAMIN) TABS tablet Take 1 tablet by mouth daily at 12 noon.        Review of Systems  Constitutional: Negative for fever and chills.  Respiratory: Negative for cough and hemoptysis.   Cardiovascular: Positive for palpitations and orthopnea.  Gastrointestinal: Positive for constipation. Negative for heartburn, nausea, vomiting, abdominal pain and diarrhea.  Genitourinary: Positive for flank pain. Negative for dysuria, frequency and hematuria.   Physical Exam   Blood pressure 104/67, pulse 93, temperature 98.5 F (36.9 C), temperature source Oral, resp. rate 16, last menstrual period  06/26/2013, SpO2 100.00%.   Physical Exam  Constitutional: She appears well-developed and well-nourished. No distress.  HENT:  Head: Normocephalic.  Cardiovascular: Normal rate and regular rhythm.   Respiratory: Effort normal and breath sounds normal.  GI: She exhibits no distension. There is no tenderness. There is no rebound and no guarding.   Dilation: Closed Effacement (%): Thick Exam by:: D. Poe, CNM   MAU Course  Procedures Results for orders placed during the hospital encounter of 12/05/13 (from the past 24 hour(s))  URINALYSIS, ROUTINE W REFLEX MICROSCOPIC     Status: None   Collection Time    12/05/13  3:50 PM      Result Value Ref Range   Color, Urine YELLOW  YELLOW   APPearance CLEAR  CLEAR   Specific Gravity, Urine 1.020  1.005 - 1.030   pH 5.5  5.0 - 8.0   Glucose, UA NEGATIVE  NEGATIVE mg/dL   Hgb urine dipstick NEGATIVE  NEGATIVE   Bilirubin Urine NEGATIVE  NEGATIVE   Ketones, ur NEGATIVE  NEGATIVE mg/dL   Protein, ur NEGATIVE  NEGATIVE mg/dL   Urobilinogen, UA 0.2  0.0 - 1.0 mg/dL   Nitrite NEGATIVE  NEGATIVE   Leukocytes, UA NEGATIVE  NEGATIVE   FHT present with doppler.    Assessment and Plan  # Reduced fetal movments - feels fetal mvmt well now - discussed how to palpate fetal movement  #  Abdominal and Back Pain - Numerous etiologies possible, unlikely to represent demise, torsion, or contractions - Supportive care gestational pain      Medication List    STOP taking these medications       potassium chloride 10 MEQ CR capsule  Commonly known as:  MICRO-K      TAKE these medications       acetaminophen 500 MG tablet  Commonly known as:  TYLENOL  Take 1,000 mg by mouth daily as needed for mild pain or headache.     omeprazole 20 MG capsule  Commonly known as:  PRILOSEC  Take 1 capsule (20 mg total) by mouth daily.     prenatal multivitamin Tabs tablet  Take 1 tablet by mouth daily at 12 noon.       Follow-up Information    [facility] [place]. (Keep your scheduled prenatal appointment)    Contact information:   [address] [place] [id number] [phone]     Michaelene SongHall, Jonathan C 12/05/2013, 5:50 PM   Evaluation and management procedures were performed by Resident physician under my supervision/collaboration. Chart reviewed, patient examined by me and I agree with management and plan.  28 yo G3P2002 at 5352w3d perceiving good FM while on EFM in MAU. Toco: no UCs. S=D. DT 155.  RLP discussed  Danae OrleansDeirdre C Poe, CNM 12/10/2013 10:11 AM

## 2013-12-05 NOTE — MAU Note (Addendum)
Using pacific interp pt states she hasn't felt her baby move since Tues of this week.  Pt also states she is experiencing lower abd pain and back pain which is intermittent.  Denies vaginal bleeding or ROM.

## 2013-12-05 NOTE — Discharge Instructions (Signed)
Segundo trimestre del embarazo  (Second Trimester of Pregnancy) El segundo trimestre del embarazo se extiende desde la semana 13 hasta la semana 28, del 4 al 6 mes. En general, es el momento del embarazo en el que se sentir mejor. Su organismo se ha adaptado a estar embarazada y comienza a sentirse fsicamente mejor. En general las nuseas matutinas han disminuido o han desaparecido completamente. El segundo trimestre es tambin la poca en la que el feto se desarrolla rpidamente. Hacia el final del sexto mes, el beb mide aproximadamente 9 pulgadas (23 cm) y pesa alrededor de 1  libras (700 g). Es probable que sienta mover al beb (dar pataditas) entre las 18 y 20 semanas del embarazo.  CAMBIOS CORPORALES  Su organismo atravesar numerosos cambios durante el embarazo. Los cambios varan de una mujer a otra.   Seguir aumentando de peso. Notar que la parte baja del abdomen se ensancha.  Podrn aparecer las primeras estras en las caderas, abdomen y mamas.  Es posible que sienta cefaleas, que se pueden aliviar con los medicamentos que su mdico le autorice a utilizar.  Tendr necesidad de orinar con ms frecuencia porque el feto est presionando en la vejiga.  Como consecuencia del embarazo, podr sentir acidez estomacal continuamente.  Podr estar constipada ya que ciertas hormonas hacen que los msculos que empujan los desechos a travs de los intestinos trabajen ms lentamente.  Pueden aparecer hemorroides o abultarse las venas (venas varicosas).  El dolor de espalda se debe al aumento de peso y a que las hormonas del embarazo relajan las articulaciones entre los huesos de la pelvis y a la modificacin del peso y a los msculos que soportan el equilibrio.  Sus mamas seguirn desarrollndose y estarn ms sensibles.  Las encas pueden sangrar y estar sensibles al cepillado y al hilo dental.  Pueden aparecer en el rostro zonas oscuras o manchas (cloasma, mscara del embarazo). Esto  desaparece despus del nacimiento del beb.  Es posible que se formeuna lnea oscura desde el ombligo a la zona del pubis (linea nigra). Esto desaparece despus del nacimiento del beb. QU DEBE ESPERAR EN LAS CONSULTAS PRENATALES  Durante una visita prenatal de rutina:   La pesarn para verificar que usted y el feto se encuentran dentro de los lmites normales.  Le tomarn la presin arterial.  Le medirn el abdomen para verificar el desarrollo del beb.  Escucharn los latidos fetales.  Se evaluarn los resultados de los estudios realizados en visitas anteriores. El mdico le preguntar:   Cmo se siente.  Si siente los movimientos del beb.  Si tiene sntomas anormales, como prdida de lquido, sangrado, dolores de cabeza intensos o clicos abdominales.  Si tiene alguna duda. Otros estudios que podrn realizarse durante el segundo trimestre son:   Anlisis de sangre para evaluar:  Niveles bajos de hierro (anemia).  Diabetes gestacional (entre las 24 y las 28 semanas).  Anticuerpos Rh.  Anlisis de orina para detectar infecciones, diabetes o protenas en la orina.  Una ecografa para confirmar si el crecimiento y el desarrollo del beb son los adecuados.  Una amniocentesis para diagnosticar posibles problemas genticos.  Estudios del feto para descartar espina bfida y sndrome de Down. INSTRUCCIONES PARA EL CUIDADO EN EL HOGAR   Evite fumar, consumir hierbas, beber alcohol y utilizar frmacos que no ne hayan recetado. Estas sustancias qumicas afectan la formacin y el desarrollo del beb.  Siga las indicaciones del profesional con respecto a como tomar los medicamentos. Durante el embarazo, hay   medicamentos que son seguros y otros no lo son.  Realice actividad fsica slo segn las indicaciones del mdico. Sentir clicos uterinos es el mejor signo para detener la actividad fsica.  Contine haciendo comidas regulares y sanas.  Use un sostn que le brinde buen  soporte si sus mamas estn sensibles.  No utilice la baera con agua caliente, baos turcos y saunas.  Colquese el cinturn de seguridad cuando conduzca.  Evite comer carne cruda y el contacto con los utensilios y desperdicios de los gatos. Estos elementos contienen grmenes que pueden causar defectos de nacimiento en el beb.  Tome las vitaminas indicadas para la etapa prenatal.  Pruebe un laxante (si el mdico la autoriza) si tiene constipacin. Consuma ms alimentos ricos en fibra, como vegetales y frutas frescos y cereales enteros. Beba gran cantidad de lquido para mantener la orina de tono claro o amarillo plido.  Tome baos de agua tibia para calmar el dolor o las molestias causadas por las hemorroides. Use una crema para las hemorroides si el mdico la autoriza.  Si tiene venas varicosas, use medias de soporte. Eleve los pies durante 15 minutos, 3 o 4 veces por da. Limite el consumo de sal en su dieta.  Evite levantar objetos pesados, use zapatos de tacones bajos y mantenga una buena postura.  Descanse con las piernas elevadas si tiene calambres o dolor de cintura.  Visite a su dentista si no lo ha hecho durante el embarazo. Use un cepillo de dientes blando para higienizarse los dientes y use suavemente el hilo dental.  Puede continuar su vida sexual siempre que el mdico la autorice.  Concurra a todas las visitas prenatales segn las indicaciones de su mdico. SOLICITE ATENCIN MDICA SI:   Tiene mareos.  Siente clicos leves, presin en la pelvis o dolor persistente en el abdomen.  Tiene nuseas o vmitos o diarrea persistentes.  Observa una secrecin vaginal con mal olor.  Siente dolor al orinar. SOLICITE ATENCIN MDICA DE INMEDIATO SI:   Tiene fiebre.  Pierde lquido por la vagina.  Tiene sangrando o pequeas prdidas vaginales.  Siente dolor intenso o clicos en el abdomen.  Sube o baja de peso rpidamente.  Tiene dificultad para respirar y le duele  pecho.  Sbitamente se le hincha el rostro, las manos, los tobillos, los pies o las piernas de manera extrema.  No ha sentido los movimientos del beb durante una hora.  Siente un dolor de cabeza intenso que no se alivia con medicamentos.  Su visin se modifica. Document Released: 06/29/2005 Document Revised: 05/22/2013 [employer] Patient Information 2014 [employer].  

## 2014-01-31 ENCOUNTER — Inpatient Hospital Stay (HOSPITAL_COMMUNITY)
Admission: AD | Admit: 2014-01-31 | Discharge: 2014-01-31 | Disposition: A | Discharge status: Home / Self Care | Payer: Medicaid Other | Source: Ambulatory Visit | Attending: Obstetrics and Gynecology | Admitting: Obstetrics and Gynecology

## 2014-01-31 ENCOUNTER — Encounter (HOSPITAL_COMMUNITY): Payer: Self-pay | Admitting: *Deleted

## 2014-01-31 DIAGNOSIS — O219 Vomiting of pregnancy, unspecified: Secondary | ICD-10-CM

## 2014-01-31 DIAGNOSIS — R12 Heartburn: Secondary | ICD-10-CM | POA: Insufficient documentation

## 2014-01-31 DIAGNOSIS — O99891 Other specified diseases and conditions complicating pregnancy: Secondary | ICD-10-CM | POA: Insufficient documentation

## 2014-01-31 DIAGNOSIS — O9989 Other specified diseases and conditions complicating pregnancy, childbirth and the puerperium: Secondary | ICD-10-CM

## 2014-01-31 DIAGNOSIS — O212 Late vomiting of pregnancy: Secondary | ICD-10-CM | POA: Insufficient documentation

## 2014-01-31 DIAGNOSIS — K219 Gastro-esophageal reflux disease without esophagitis: Secondary | ICD-10-CM | POA: Insufficient documentation

## 2014-01-31 MED ORDER — PROMETHAZINE HCL 12.5 MG PO TABS: 12.5000 mg | ORAL_TABLET | Freq: Four times a day (QID) | ORAL | Status: DC | PRN

## 2014-01-31 MED ORDER — PROMETHAZINE HCL 25 MG PO TABS
25.0000 mg | ORAL_TABLET | Freq: Once | ORAL | Status: AC
  Administered 2014-01-31: 25 mg via ORAL
  Filled 2014-01-31: qty 1

## 2014-01-31 MED ORDER — FAMOTIDINE 20 MG PO TABS
20.0000 mg | ORAL_TABLET | Freq: Once | ORAL | Status: AC
  Administered 2014-01-31: 20 mg via ORAL
  Filled 2014-01-31: qty 1

## 2014-01-31 MED ORDER — FAMOTIDINE 20 MG PO TABS: 20.0000 mg | ORAL_TABLET | Freq: Every day | ORAL | Status: DC

## 2014-01-31 NOTE — MAU Note (Signed)
Urine in lab 

## 2014-01-31 NOTE — MAU Provider Note (Signed)
Attestation of Attending Supervision of Advanced Practitioner (CNM/NP): Evaluation and management procedures were performed by the Advanced Practitioner under my supervision and collaboration.  I have reviewed the Advanced Practitioner's note and chart, and I agree with the management and plan.  Caldwell Kronenberger 01/31/2014 7:45 PM

## 2014-01-31 NOTE — MAU Note (Signed)
Nausea, heartburn and some vomiting.  Started on Monday. Not kept anything down since Wed night

## 2014-01-31 NOTE — Discharge Instructions (Signed)
Náuseas matinales  (Morning Sickness)  Se denominan náuseas matinales a las ganas de vomitar (náuseas) durante el embarazo. Esta sensación puede estar acompañada o no de vómitos. Aparecen por la mañana, pero puede ser un problema a lo largo de todo el día. Las náuseas matinales son más frecuentes durante el primer trimestre, pero pueden continuar durante todo el embarazo. Aunque son molestas, generalmente no causan ningún daño, excepto que presente vómitos continuos e intensos (hiperemesis gravídica). Este problema requiere un tratamiento más intenso.   CAUSAS   La causa de las náuseas matinales no se conoce completamente pero parecen estar relacionadas con los cambios hormonales que ocurren durante el embarazo.  FACTORES DE RIESGO  Usted tendrá mayor riesgo si:  · Sufría de náuseas o vómitos antes de quedar embarazada.  · Tuvo náuseas matinales durante los embarazos previos.  · Está embarazada de más de un bebé, por ejemplo mellizos.  TRATAMIENTO   No utilice ningún medicamento (prescripto, de venta libre ni hierbas) para este problema sin consultar con su médico. El médico también podrá recetar o recomendar:  · Suplementos de vitamina B6.  · Medicamentos para las nauseas  · La medicina herbal llamada jengibre.  INSTRUCCIONES PARA EL CUIDADO EN EL HOGAR   · Tome sólo medicamentos de venta libre o recetados, según las indicaciones del médico.  · Tomar un multivitamínico antes de quedar embarazada puede prevenir o disminuir la gravedad de las náuseas matinales en la mayoría de las mujeres.  · Coma un trozo de tostada seca o una cracker sin sal antes de levantarse de la cama por la mañana.  · Coma 5 o 6 comidas pequeñas por día.  · Consuma alimentos blandos y secos (arroz, papas asadas). Los alimentos ricos en hidratos de carbono generalmente ayudan.  · No  beba líquidos con las comidas. Tome líquidos entre las comidas.  · Evite los alimentos muy grasos o condimentados.  · Pídale a otra persona que cocine para usted  si el olor de algún alimento le provoca náuseas o vómitos.  · Si tiene ganas de vomitar después de tomar las vitaminas prenatales, tómelas a la noche o con una colación.  · Tome colaciones de alimentos proteicos entre comidas si siente apetito.  · Coma gelatina sin azúcar de postre.  · Una pulsera de acupresión ( que se utiliza para mareos en viajes) puede ser de utilidad.  · La acupuntura puede ayudarla.  · No fume.  · Consiga un humidificador para mantener el aire de su casa libre de olores.  · Trate de respirar aire fresco.  SOLICITE ATENCIÓN MÉDICA SI:   · Los remedios caseros no funcionan y necesita medicamentos.  · Se siente mareada o sufre un desmayo.  · Pierde peso.  SOLICITE ATENCIÓN MÉDICA DE INMEDIATO SI:   · Tiene náuseas y vómitos de manera persistente y no puede controlarlos.  · Pierde el conocimiento (se desmaya).  Document Released: 01/05/2009 Document Revised: 05/22/2013  [employer]® Patient Information ©2014 [employer].

## 2014-01-31 NOTE — MAU Provider Note (Signed)
Chief Complaint:  Heartburn  @MAUPATCONTACT @  HPI: [name] is a 28 y.o. G3P2002 at 4963w6d who presents to maternity admissions reporting nausea / vomiting intermittently x 5 days. She reports no prior significant hx of nausea / vomiting during current pregnancy. Also complains of heartburn associated with vomiting. Has not been treated with any medications for this or tried anything OTC. States she has been unable to keep down significant amt of food / liquids today.  Denies contractions, leakage of fluid or vaginal bleeding. Good fetal movement. Denies any vaginal discharge, itching, dysuria, fever/chills.  Significant PMH: Hx Bulimia (currently followed by PCP-TAPM / Counseling), reports that she has been improved and without any self-induced vomiting during current pregnancy.  [facility] Denies any significant complications with pregnancy.  Past Medical History: Past Medical History  Diagnosis Date  . Eating disorder   . Bulimia     Past obstetric history: OB History  Gravida Para Term Preterm AB SAB TAB Ectopic Multiple Living  3 2 2       2     # Outcome Date GA Lbr Len/2nd Weight Sex Delivery Anes PTL Lv  3 CUR           2 TRM      SVD EPI N Y  1 TRM      SVD EPI N Y      Past Surgical History: Past Surgical History  Procedure Laterality Date  . No past surgeries      Family History: Family History  Problem Relation Age of Onset  . Hearing loss Neg Hx     Social History: History  Substance Use Topics  . Smoking status: Never Smoker   . Smokeless tobacco: Never Used  . Alcohol Use: No    Allergies: No Known Allergies  Meds:  No prescriptions prior to admission    ROS: Pertinent findings in history of present illness.  Physical Exam  Blood pressure 106/58, pulse 83, temperature 98.2 F (36.8 C), temperature source Oral, resp. rate 16, height 5' 1.5" (1.562 m), weight 73.029 kg (161 lb), last  menstrual period 06/26/2013.  GENERAL: Well-appearing, pleasant, comfortable, NAD HEENT: Mild dry MM and tongue HEART: RRR, no murmurs. No tachycardia RESP: CTAB, nml effort MSK: No CVAT ABDOMEN: Soft, mild TTP epigastric, otherwise mostly non-tender, gravid appropriate for gestational age EXTREMITIES: Nontender, no edema NEURO: alert and oriented    FHT:  Baseline 155 bpm, moderate variability, accelerations present, no decelerations Contractions: None   Labs: No results found for this or any previous visit (from the past 24 hour(s)).  Imaging:  No results found. MAU Course:   Assessment: 1. Nausea/vomiting in pregnancy     [name] is a 28 y.o. G3P2002 at 8163w6d by US, presented to MAU with nausea / vomiting and heartburn. No active vomiting, comfortable and well-appearing, vitals stable, poor PO intake x 5 days. Suspect mild dehydration d/t vomiting. Denied any self-induced vomiting (significant hx Bulimia, currently treated). Also, significant heartburn with GERD-pregnancy related, previously untreated. FHT reactive and reassuring, continue to monitor.  UPDATE: 1430 - Improved s/p phenergan and pepcid. Denies any pain, nausea, no recent vomiting. Tolerated PO.   Plan: - UA unremarkable, nml spec grav 1.020, no evidence of UTI - Phenergan 25mg  PO x 1 - Pepcid 20mg  PO x 1 - Discharge to home, sent rx for Phenergan and Pepcid to pharmacy, advised on hydration, morning sickness advice, and printed handout, [facility]     Follow-up Information   [facility] [place] [facility]. (Follow-up with next prenatal visit.)    Contact information:   [address] [place] [id number] [phone]       Medication List         acetaminophen 500 MG tablet  Commonly known as:  TYLENOL  Take 500 mg by mouth daily as needed for mild pain or headache.     famotidine 20 MG tablet  Commonly known as:  PEPCID  Take 1 tablet (20 mg  total) by mouth daily.     prenatal multivitamin Tabs tablet  Take 1 tablet by mouth daily at 12 noon.     promethazine 12.5 MG tablet  Commonly known as:  PHENERGAN  Take 1-2 tablets (12.5-25 mg total) by mouth every 6 (six) hours as needed for nausea or vomiting.        Saralyn PilarAlexander Karamalegos, DO [facility], PGY-1 01/31/2014 3:59 PM   I have seen and examined this patient and agree with above documentation in the resident's note.   Rulon AbideKeli Fredonia Casalino, M.D. [facility] 01/31/2014 3:59 PM

## 2014-03-19 LAB — OB RESULTS CONSOLE GBS: STREP GROUP B AG: NEGATIVE

## 2014-04-11 ENCOUNTER — Inpatient Hospital Stay (HOSPITAL_COMMUNITY)
Admission: AD | Admit: 2014-04-11 | Discharge: 2014-04-12 | Disposition: A | Discharge status: Home / Self Care | Payer: Medicaid Other | Source: Ambulatory Visit | Attending: Obstetrics & Gynecology | Admitting: Obstetrics & Gynecology

## 2014-04-11 DIAGNOSIS — O479 False labor, unspecified: Secondary | ICD-10-CM

## 2014-04-12 ENCOUNTER — Inpatient Hospital Stay (HOSPITAL_COMMUNITY)
Admission: AD | Admit: 2014-04-12 | Discharge: 2014-04-13 | DRG: 774 | Disposition: A | Discharge status: Home / Self Care | Payer: Medicaid Other | Source: Ambulatory Visit | Attending: Obstetrics and Gynecology | Admitting: Obstetrics and Gynecology

## 2014-04-12 ENCOUNTER — Encounter (HOSPITAL_COMMUNITY): Payer: Self-pay | Admitting: *Deleted

## 2014-04-12 ENCOUNTER — Encounter (HOSPITAL_COMMUNITY): Payer: Self-pay

## 2014-04-12 ENCOUNTER — Inpatient Hospital Stay (HOSPITAL_COMMUNITY)
Admission: AD | Admit: 2014-04-12 | Discharge: 2014-04-12 | Disposition: A | Discharge status: Home / Self Care | Payer: Medicaid Other | Source: Ambulatory Visit | Attending: Obstetrics and Gynecology | Admitting: Obstetrics and Gynecology

## 2014-04-12 DIAGNOSIS — O479 False labor, unspecified: Secondary | ICD-10-CM

## 2014-04-12 DIAGNOSIS — Z3490 Encounter for supervision of normal pregnancy, unspecified, unspecified trimester: Secondary | ICD-10-CM

## 2014-04-12 LAB — DIFFERENTIAL
Basophils Absolute: 0 10*3/uL (ref 0.0–0.1)
Basophils Relative: 0 % (ref 0–1)
Eosinophils Absolute: 0 10*3/uL (ref 0.0–0.7)
Eosinophils Relative: 0 % (ref 0–5)
LYMPHS ABS: 1.9 10*3/uL (ref 0.7–4.0)
Lymphocytes Relative: 12 % (ref 12–46)
Monocytes Absolute: 0.9 10*3/uL (ref 0.1–1.0)
Monocytes Relative: 6 % (ref 3–12)
NEUTROS ABS: 12.9 10*3/uL — AB (ref 1.7–7.7)
NEUTROS PCT: 82 % — AB (ref 43–77)

## 2014-04-12 LAB — TYPE AND SCREEN
ABO/RH(D): O POS
Antibody Screen: NEGATIVE

## 2014-04-12 LAB — CBC
HCT: 40.6 % (ref 36.0–46.0)
Hemoglobin: 14 g/dL (ref 12.0–15.0)
MCH: 30.4 pg (ref 26.0–34.0)
MCHC: 34.5 g/dL (ref 30.0–36.0)
MCV: 88.3 fL (ref 78.0–100.0)
Platelets: 312 10*3/uL (ref 150–400)
RBC: 4.6 MIL/uL (ref 3.87–5.11)
RDW: 14.4 % (ref 11.5–15.5)
WBC: 15.8 10*3/uL — ABNORMAL HIGH (ref 4.0–10.5)

## 2014-04-12 LAB — BASIC METABOLIC PANEL
ANION GAP: 18 — AB (ref 5–15)
BUN: 9 mg/dL (ref 6–23)
CO2: 17 mEq/L — ABNORMAL LOW (ref 19–32)
Calcium: 9.2 mg/dL (ref 8.4–10.5)
Chloride: 99 mEq/L (ref 96–112)
Creatinine, Ser: 0.55 mg/dL (ref 0.50–1.10)
GFR calc Af Amer: >90 mL/min (ref >90)
GFR calc non Af Amer: >90 mL/min (ref >90)
Glucose, Bld: 106 mg/dL — ABNORMAL HIGH (ref 70–99)
Potassium: 3.9 mEq/L (ref 3.7–5.3)
Sodium: 134 mEq/L — ABNORMAL LOW (ref 137–147)

## 2014-04-12 MED ORDER — OXYCODONE-ACETAMINOPHEN 5-325 MG PO TABS
1.0000 | ORAL_TABLET | ORAL | Status: DC | PRN
  Administered 2014-04-12: 1 via ORAL
  Filled 2014-04-12: qty 1

## 2014-04-12 MED ORDER — ONDANSETRON HCL 4 MG/2ML IJ SOLN
4.0000 mg | Freq: Four times a day (QID) | INTRAMUSCULAR | Status: DC | PRN
Start: 2014-04-12 — End: 2014-04-13

## 2014-04-12 MED ORDER — OXYCODONE-ACETAMINOPHEN 5-325 MG PO TABS
1.0000 | ORAL_TABLET | ORAL | Status: DC | PRN
  Administered 2014-04-12: 1 via ORAL
  Administered 2014-04-13: 2 via ORAL
  Filled 2014-04-12: qty 1
  Filled 2014-04-12: qty 2

## 2014-04-12 MED ORDER — METHYLERGONOVINE MALEATE 0.2 MG/ML IJ SOLN
INTRAMUSCULAR | Status: AC
  Administered 2014-04-12: 0.2 mg
  Filled 2014-04-12: qty 1

## 2014-04-12 MED ORDER — ONDANSETRON HCL 4 MG/2ML IJ SOLN: 4.0000 mg | INTRAMUSCULAR | Status: DC | PRN

## 2014-04-12 MED ORDER — LACTATED RINGERS IV SOLN
INTRAVENOUS | Status: DC
  Administered 2014-04-12: 17:00:00 via INTRAVENOUS

## 2014-04-12 MED ORDER — DIBUCAINE 1 % RE OINT: 1.0000 "application " | TOPICAL_OINTMENT | RECTAL | Status: DC | PRN

## 2014-04-12 MED ORDER — LANOLIN HYDROUS EX OINT: TOPICAL_OINTMENT | CUTANEOUS | Status: DC | PRN

## 2014-04-12 MED ORDER — OXYTOCIN 40 UNITS IN LACTATED RINGERS INFUSION - SIMPLE MED: 62.5000 mL/h | INTRAVENOUS | Status: DC

## 2014-04-12 MED ORDER — SENNOSIDES-DOCUSATE SODIUM 8.6-50 MG PO TABS
2.0000 | ORAL_TABLET | ORAL | Status: DC
  Administered 2014-04-12: 2 via ORAL
  Filled 2014-04-12: qty 2

## 2014-04-12 MED ORDER — ZOLPIDEM TARTRATE 5 MG PO TABS: 5.0000 mg | ORAL_TABLET | Freq: Every evening | ORAL | Status: DC | PRN

## 2014-04-12 MED ORDER — LACTATED RINGERS IV SOLN: 500.0000 mL | INTRAVENOUS | Status: DC | PRN

## 2014-04-12 MED ORDER — OXYTOCIN 40 UNITS IN LACTATED RINGERS INFUSION - SIMPLE MED
INTRAVENOUS | Status: AC
  Filled 2014-04-12: qty 1000

## 2014-04-12 MED ORDER — ONDANSETRON HCL 4 MG PO TABS: 4.0000 mg | ORAL_TABLET | ORAL | Status: DC | PRN

## 2014-04-12 MED ORDER — CITRIC ACID-SODIUM CITRATE 334-500 MG/5ML PO SOLN: 30.0000 mL | ORAL | Status: DC | PRN

## 2014-04-12 MED ORDER — PRENATAL MULTIVITAMIN CH
1.0000 | ORAL_TABLET | Freq: Every day | ORAL | Status: DC
  Administered 2014-04-13: 1 via ORAL
  Filled 2014-04-12: qty 1

## 2014-04-12 MED ORDER — IBUPROFEN 600 MG PO TABS
600.0000 mg | ORAL_TABLET | Freq: Four times a day (QID) | ORAL | Status: DC
  Administered 2014-04-12 – 2014-04-13 (×4): 600 mg via ORAL
  Filled 2014-04-12 (×4): qty 1

## 2014-04-12 MED ORDER — WITCH HAZEL-GLYCERIN EX PADS: 1.0000 "application " | MEDICATED_PAD | CUTANEOUS | Status: DC | PRN

## 2014-04-12 MED ORDER — DIPHENHYDRAMINE HCL 25 MG PO CAPS: 25.0000 mg | ORAL_CAPSULE | Freq: Four times a day (QID) | ORAL | Status: DC | PRN

## 2014-04-12 MED ORDER — ZOLPIDEM TARTRATE 5 MG PO TABS
5.0000 mg | ORAL_TABLET | Freq: Once | ORAL | Status: AC
  Administered 2014-04-12: 5 mg via ORAL
  Filled 2014-04-12: qty 1

## 2014-04-12 MED ORDER — OXYTOCIN BOLUS FROM INFUSION
500.0000 mL | INTRAVENOUS | Status: DC
  Administered 2014-04-12: 500 mL via INTRAVENOUS

## 2014-04-12 MED ORDER — FENTANYL CITRATE 0.05 MG/ML IJ SOLN
50.0000 ug | Freq: Once | INTRAMUSCULAR | Status: AC
  Administered 2014-04-12: 50 ug via INTRAVENOUS
  Filled 2014-04-12: qty 2

## 2014-04-12 MED ORDER — LIDOCAINE HCL (PF) 1 % IJ SOLN
30.0000 mL | INTRAMUSCULAR | Status: DC | PRN
  Filled 2014-04-12: qty 30

## 2014-04-12 MED ORDER — IBUPROFEN 600 MG PO TABS
600.0000 mg | ORAL_TABLET | Freq: Four times a day (QID) | ORAL | Status: DC | PRN
  Administered 2014-04-12: 600 mg via ORAL
  Filled 2014-04-12: qty 1

## 2014-04-12 MED ORDER — SIMETHICONE 80 MG PO CHEW: 80.0000 mg | CHEWABLE_TABLET | ORAL | Status: DC | PRN

## 2014-04-12 MED ORDER — MISOPROSTOL 200 MCG PO TABS
ORAL_TABLET | ORAL | Status: AC
Start: 2014-04-12 — End: 2014-04-13
  Filled 2014-04-12: qty 4

## 2014-04-12 MED ORDER — METHYLERGONOVINE MALEATE 0.2 MG/ML IJ SOLN: 0.2000 mg | INTRAMUSCULAR | Status: AC

## 2014-04-12 MED ORDER — ACETAMINOPHEN 325 MG PO TABS
650.0000 mg | ORAL_TABLET | ORAL | Status: DC | PRN
Start: 2014-04-12 — End: 2014-04-13

## 2014-04-12 MED ORDER — MISOPROSTOL 200 MCG PO TABS
800.0000 ug | ORAL_TABLET | Freq: Once | ORAL | Status: AC
  Administered 2014-04-12: 800 ug via RECTAL

## 2014-04-12 MED ORDER — BENZOCAINE-MENTHOL 20-0.5 % EX AERO
1.0000 "application " | INHALATION_SPRAY | CUTANEOUS | Status: DC | PRN
  Filled 2014-04-12: qty 56

## 2014-04-12 MED ORDER — LIDOCAINE HCL (PF) 1 % IJ SOLN
INTRAMUSCULAR | Status: AC
  Administered 2014-04-12: 30 mL
  Filled 2014-04-12: qty 30

## 2014-04-12 MED ORDER — METHYLERGONOVINE MALEATE 0.2 MG PO TABS
0.2000 mg | ORAL_TABLET | ORAL | Status: AC
  Administered 2014-04-12 – 2014-04-13 (×6): 0.2 mg via ORAL
  Filled 2014-04-12 (×6): qty 1

## 2014-04-12 MED ORDER — TETANUS-DIPHTH-ACELL PERTUSSIS 5-2.5-18.5 LF-MCG/0.5 IM SUSP: 0.5000 mL | Freq: Once | INTRAMUSCULAR | Status: DC

## 2014-04-12 NOTE — Discharge Instructions (Signed)
Contracciones de Braxton Hicks °(Braxton Hicks Contractions) °Durante el embarazo, pueden presentarse contracciones uterinas que no siempre indican que está en trabajo de parto.  °¿QUÉ SON LAS CONTRACCIONES DE BRAXTON HICKS?  °Las contracciones que se presentan antes del trabajo de parto se conocen como contracciones de Braxton Hicks o falso trabajo de parto. Hacia el final del embarazo (32 a 34 semanas), estas contracciones pueden aparecen con más frecuencia y volverse más intensas. No corresponden al trabajo de parto verdadero porque estas contracciones no producen el agrandamiento (la dilatación) y el afinamiento del cuello del útero. Algunas veces, es difícil distinguirlas del trabajo de parto verdadero porque en algunos casos pueden ser muy intensas, y las personas tienen diferentes niveles de tolerancia al dolor. No debe sentirse avergonzada si concurre al hospital con falso trabajo de parto. En ocasiones, la única forma de saber si el trabajo de parto es verdadero es que el médico determine si hay cambios en el cuello del útero. °Si no hay problemas prenatales u otras complicaciones de salud asociadas con el embarazo, no habrá inconvenientes si la envían a su casa con falso trabajo de parto y espera que comience el verdadero. °CÓMO DIFERENCIAR EL TRABAJO DE PARTO FALSO DEL VERDADERO °Falso trabajo de parto °· Las contracciones del falso trabajo de parto duran menos y no son tan intensas como las verdaderas. °· Generalmente son irregulares. °· A menudo, se sienten en la parte delantera de la parte baja del abdomen y en la ingle, °· y pueden desaparecer cuando camina o cambia de posición mientras está acostada. °· Las contracciones se vuelven más débiles y su duración es menor a medida que el tiempo transcurre. °· Por lo general, no se hacen progresivamente más intensas, regulares y cercanas entre sí como en el caso del trabajo de parto verdadero. °Verdadero trabajo de parto °· Las contracciones del verdadero  trabajo de parto duran de 30 a 70 segundos, son muy regulares y suelen volverse más intensas, y aumenta su frecuencia. °· No desaparecen cuando camina. °· La molestia generalmente se siente en la parte superior del útero y se extiende hacia la zona inferior del abdomen y hacia la cintura. °· El médico podrá examinarla para determinar si el trabajo de parto es verdadero. El examen mostrará si el cuello del útero se está dilatando y afinando. °LO QUE DEBE RECORDAR °· Continúe haciendo los ejercicios habituales y siga otras indicaciones que el médico le dé. °· Tome todos los medicamentos como le indicó el médico. °· Concurra a las visitas prenatales regulares. °· Coma y beba con moderación si cree que está en trabajo de parto. °· Si las contracciones de Braxton Hicks le provocan incomodidad: °¨ Cambie de posición: si está acostada o descansando, camine; si está caminando, descanse. °¨ Siéntese y descanse en una bañera con agua tibia. °¨ Beba 2 o 3 vasos de agua. La deshidratación puede provocar contracciones. °¨ Respire lenta y profundamente varias veces por hora. °¿CUÁNDO DEBO BUSCAR ASISTENCIA MÉDICA INMEDIATA? °Solicite atención médica de inmediato si: °· Las contracciones se intensifican, se hacen más regulares y cercanas entre sí. °· Tiene una pérdida de líquido por la vagina. °· Tiene fiebre. °· Elimina mucosidad manchada con sangre. °· Tiene una hemorragia vaginal abundante. °· Tiene dolor abdominal permanente. °· Tiene un dolor en la zona lumbar que nunca tuvo antes. °· Siente que la cabeza del bebé empuja hacia abajo y ejerce presión en la zona pélvica. °· El bebé no se mueve tanto como solía. °Document Released: 06/29/2005 Document Revised: 09/24/2013 °[employer]® Patient   Information ©2015 [employer]. This information is not intended to replace advice given to you by your health care provider. Make sure you discuss any questions you have with your health care provider. ° °Evaluación de los movimientos  fetales  °(Fetal Movement Counts) °Nombre del paciente: __________________________________________________ Fecha de parto estimada: ____________________ °La evaluación de los movimientos fetales es muy recomendable en los embarazos de alto riesgo, pero también es una buena idea que lo hagan todas las embarazadas. El médico le indicará que comience a contarlos a las 28 semanas de embarazo. Los movimientos fetales suelen aumentar:  °· Después de una comida completa. °· Después de la actividad física. °· Después de comer o beber algo dulce o frío. °· En reposo. °Preste atención cuando sienta que el bebé está más activo. Esto le ayudará a notar un patrón de ciclos de vigilia y sueño de su bebé y cuáles son los factores que contribuyen a un aumento de los movimientos fetales. Es importante llevar a cabo un recuento de movimientos fetales, al mismo tiempo cada día, cuando el bebé normalmente está más activo.  °CÓMO CONTAR LOS MOVIMIENTOS FETALES °1. Busque un lugar tranquilo y cómodo para sentarse o recostarse sobre el lado izquierdo. Al recostarse sobre su lado izquierdo, le proporciona una mejor circulación de sangre y oxígeno al bebé. °2. Anote el día y la hora en una hoja de papel o en un diario. °3. Comience contando las pataditas, revoloteos, chasquidos, vueltas o pinchazos en un período de 2 horas. Debe sentir al menos 10 movimientos en 2 horas. °4. Si no siente 10 movimientos en 2 horas, espere 2 ó 3 horas y cuente de nuevo. Busque cambios en el patrón o si no cuenta lo suficiente en 2 horas. °SOLICITE ATENCIÓN MÉDICA SI:  °· Siente menos de 10 pataditas en 2 horas, en dos intentos. °· No hay movimientos durante una hora. °· El patrón se modifica o le lleva más tiempo cada día contar las 10 pataditas. °· Siente que el bebé no se mueve como lo hace habitualmente. °Fecha: ____________ Movimientos: ____________ Hora de inicio: ____________ Hora de finalización: ____________  °Fecha: ____________ Movimientos:  ____________ Hora de inicio: ____________ Hora de finalización: ____________  °Fecha: ____________ Movimientos: ____________ Hora de inicio: ____________ Hora de finalización: ____________  °Fecha: ____________ Movimientos: ____________ Hora de inicio: ____________ Hora de finalización: ____________  °Fecha: ____________ Movimientos: ____________ Hora de inicio: ____________ Hora de finalización: ____________  °Fecha: ____________ Movimientos: ____________ Hora de inicio: ____________ Hora de finalización: ____________  °Fecha: ____________ Movimientos: ____________ Hora de inicio: ____________ Hora de finalización: ____________  °Fecha: ____________ Movimientos: ____________ Hora de inicio: ____________ Hora de finalización: ____________  °Fecha: ____________ Movimientos: ____________ Hora de inicio: ____________ Hora de finalización: ____________  °Fecha: ____________ Movimientos: ____________ Hora de inicio: ____________ Hora de finalización: ____________  °Fecha: ____________ Movimientos: ____________ Hora de inicio: ____________ Hora de finalización: ____________  °Fecha: ____________ Movimientos: ____________ Hora de inicio: ____________ Hora de finalización: ____________  °Fecha: ____________ Movimientos: ____________ Hora de inicio: ____________ Hora de finalización: ____________  °Fecha: ____________ Movimientos: ____________ Hora de inicio: ____________ Hora de finalización: ____________  °Fecha: ____________ Movimientos: ____________ Hora de inicio: ____________ Hora de finalización: ____________  °Fecha: ____________ Movimientos: ____________ Hora de inicio: ____________ Hora de finalización: ____________  °Fecha: ____________ Movimientos: ____________ Hora de inicio: ____________ Hora de finalización: ____________  °Fecha: ____________ Movimientos: ____________ Hora de inicio: ____________ Hora de finalización: ____________  °Fecha: ____________ Movimientos: ____________ Hora de inicio: ____________  Hora de finalización: ____________  °Fecha:   ____________ Movimientos: ____________ Stevan BornHora de inicio: ____________ Stevan BornHora de finalizacin: ____________  Franco NonesFecha: ____________ Movimientos: ____________ Stevan BornHora de inicio: ____________ Stevan BornHora de finalizacin: ____________  Franco NonesFecha: ____________ Movimientos: ____________ Stevan BornHora de inicio: ____________ Stevan BornHora de finalizacin: ____________  Franco NonesFecha: ____________ Movimientos: ____________ Stevan BornHora de inicio: ____________ Stevan BornHora de finalizacin: ____________  Franco NonesFecha: ____________ Movimientos: ____________ Stevan BornHora de inicio: ____________ Stevan BornHora de finalizacin: ____________  Franco NonesFecha: ____________ Movimientos: ____________ Stevan BornHora de inicio: ____________ Mammie RussianHora de finalizacin: ____________  Franco NonesFecha: ____________ Movimientos: ____________ Mammie RussianHora de inicio: ____________ Mammie RussianHora de finalizacin: ____________  Franco NonesFecha: ____________ Movimientos: ____________ Mammie RussianHora de inicio: ____________ Mammie RussianHora de finalizacin: ____________  Franco NonesFecha: ____________ Movimientos: ____________ Mammie RussianHora de inicio: ____________ Mammie RussianHora de finalizacin: ____________  Franco NonesFecha: ____________ Movimientos: ____________ Mammie RussianHora de inicio: ____________ Mammie RussianHora de finalizacin: ____________  Franco NonesFecha: ____________ Movimientos: ____________ Mammie RussianHora de inicio: ____________ Mammie RussianHora de finalizacin: ____________  Franco NonesFecha: ____________ Movimientos: ____________ Mammie RussianHora de inicio: ____________ Mammie RussianHora de finalizacin: ____________  Franco NonesFecha: ____________ Movimientos: ____________ Mammie RussianHora de inicio: ____________ Mammie RussianHora de finalizacin: ____________  Franco NonesFecha: ____________ Movimientos: ____________ Mammie RussianHora de inicio: ____________ Mammie RussianHora de finalizacin: ____________  Franco NonesFecha: ____________ Movimientos: ____________ Mammie RussianHora de inicio: ____________ Mammie RussianHora de finalizacin: ____________  Franco NonesFecha: ____________ Movimientos: ____________ Mammie RussianHora de inicio: ____________ Mammie RussianHora de finalizacin: ____________  Franco NonesFecha: ____________ Movimientos: ____________ Mammie RussianHora de inicio: ____________ Mammie RussianHora de finalizacin: ____________  Franco NonesFecha:  ____________ Movimientos: ____________ Mammie RussianHora de inicio: ____________ Mammie RussianHora de finalizacin: ____________  Franco NonesFecha: ____________ Movimientos: ____________ Mammie RussianHora de inicio: ____________ Mammie RussianHora de finalizacin: ____________  Franco NonesFecha: ____________ Movimientos: ____________ Mammie RussianHora de inicio: ____________ Mammie RussianHora de finalizacin: ____________  Franco NonesFecha: ____________ Movimientos: ____________ Mammie RussianHora de inicio: ____________ Mammie RussianHora de finalizacin: ____________  Franco NonesFecha: ____________ Movimientos: ____________ Mammie RussianHora de inicio: ____________ Mammie RussianHora de finalizacin: ____________  Franco NonesFecha: ____________ Movimientos: ____________ Mammie RussianHora de inicio: ____________ Mammie RussianHora de finalizacin: ____________  Franco NonesFecha: ____________ Movimientos: ____________ Mammie RussianHora de inicio: ____________ Mammie RussianHora de finalizacin: ____________  Franco NonesFecha: ____________ Movimientos: ____________ Mammie RussianHora de inicio: ____________ Mammie RussianHora de finalizacin: ____________  Franco NonesFecha: ____________ Movimientos: ____________ Mammie RussianHora de inicio: ____________ Mammie RussianHora de finalizacin: ____________  Franco NonesFecha: ____________ Movimientos: ____________ Mammie RussianHora de inicio: ____________ Mammie RussianHora de finalizacin: ____________  Franco NonesFecha: ____________ Movimientos: ____________ Mammie RussianHora de inicio: ____________ Mammie RussianHora de finalizacin: ____________  Franco NonesFecha: ____________ Movimientos: ____________ Mammie RussianHora de inicio: ____________ Mammie RussianHora de finalizacin: ____________  Franco NonesFecha: ____________ Movimientos: ____________ Mammie RussianHora de inicio: ____________ Mammie RussianHora de finalizacin: ____________  Franco NonesFecha: ____________ Movimientos: ____________ Mammie RussianHora de inicio: ____________ Mammie RussianHora de finalizacin: ____________  Franco NonesFecha: ____________ Movimientos: ____________ Mammie RussianHora de inicio: ____________ Mammie RussianHora de finalizacin: ____________  Franco NonesFecha: ____________ Movimientos: ____________ Mammie RussianHora de inicio: ____________ Mammie RussianHora de finalizacin: ____________  Franco NonesFecha: ____________ Movimientos: ____________ Mammie RussianHora de inicio: ____________ Mammie RussianHora de finalizacin: ____________  Franco NonesFecha: ____________ Movimientos: ____________ Mammie RussianHora  de inicio: ____________ Mammie RussianHora de finalizacin: ____________  Franco NonesFecha: ____________ Movimientos: ____________ Mammie RussianHora de inicio: ____________ Mammie RussianHora de finalizacin: ____________  Franco NonesFecha: ____________ Movimientos: ____________ Mammie RussianHora de inicio: ____________ Mammie RussianHora de finalizacin: ____________  Document Released: 12/27/2007 Document Revised: 09/05/2012 [employer] Patient Information 2015 [employer]. This information is not intended to replace advice given to you by your health care provider. Make sure you discuss any questions you have with your health care provider.

## 2014-04-12 NOTE — Discharge Instructions (Signed)
Evaluación de los movimientos fetales  °(Fetal Movement Counts) °Nombre del paciente: __________________________________________________ Fecha de parto estimada: ____________________ °La evaluación de los movimientos fetales es muy recomendable en los embarazos de alto riesgo, pero también es una buena idea que lo hagan todas las embarazadas. El médico le indicará que comience a contarlos a las 28 semanas de embarazo. Los movimientos fetales suelen aumentar:  °· Después de una comida completa. °· Después de la actividad física. °· Después de comer o beber algo dulce o frío. °· En reposo. °Preste atención cuando sienta que el bebé está más activo. Esto le ayudará a notar un patrón de ciclos de vigilia y sueño de su bebé y cuáles son los factores que contribuyen a un aumento de los movimientos fetales. Es importante llevar a cabo un recuento de movimientos fetales, al mismo tiempo cada día, cuando el bebé normalmente está más activo.  °CÓMO CONTAR LOS MOVIMIENTOS FETALES °1. Busque un lugar tranquilo y cómodo para sentarse o recostarse sobre el lado izquierdo. Al recostarse sobre su lado izquierdo, le proporciona una mejor circulación de sangre y oxígeno al bebé. °2. Anote el día y la hora en una hoja de papel o en un diario. °3. Comience contando las pataditas, revoloteos, chasquidos, vueltas o pinchazos en un período de 2 horas. Debe sentir al menos 10 movimientos en 2 horas. °4. Si no siente 10 movimientos en 2 horas, espere 2 ó 3 horas y cuente de nuevo. Busque cambios en el patrón o si no cuenta lo suficiente en 2 horas. °SOLICITE ATENCIÓN MÉDICA SI:  °· Siente menos de 10 pataditas en 2 horas, en dos intentos. °· No hay movimientos durante una hora. °· El patrón se modifica o le lleva más tiempo cada día contar las 10 pataditas. °· Siente que el bebé no se mueve como lo hace habitualmente. °Fecha: ____________ Movimientos: ____________ Hora de inicio: ____________ Hora de finalización: ____________  °Fecha:  ____________ Movimientos: ____________ Hora de inicio: ____________ Hora de finalización: ____________  °Fecha: ____________ Movimientos: ____________ Hora de inicio: ____________ Hora de finalización: ____________  °Fecha: ____________ Movimientos: ____________ Hora de inicio: ____________ Hora de finalización: ____________  °Fecha: ____________ Movimientos: ____________ Hora de inicio: ____________ Hora de finalización: ____________  °Fecha: ____________ Movimientos: ____________ Hora de inicio: ____________ Hora de finalización: ____________  °Fecha: ____________ Movimientos: ____________ Hora de inicio: ____________ Hora de finalización: ____________  °Fecha: ____________ Movimientos: ____________ Hora de inicio: ____________ Hora de finalización: ____________  °Fecha: ____________ Movimientos: ____________ Hora de inicio: ____________ Hora de finalización: ____________  °Fecha: ____________ Movimientos: ____________ Hora de inicio: ____________ Hora de finalización: ____________  °Fecha: ____________ Movimientos: ____________ Hora de inicio: ____________ Hora de finalización: ____________  °Fecha: ____________ Movimientos: ____________ Hora de inicio: ____________ Hora de finalización: ____________  °Fecha: ____________ Movimientos: ____________ Hora de inicio: ____________ Hora de finalización: ____________  °Fecha: ____________ Movimientos: ____________ Hora de inicio: ____________ Hora de finalización: ____________  °Fecha: ____________ Movimientos: ____________ Hora de inicio: ____________ Hora de finalización: ____________  °Fecha: ____________ Movimientos: ____________ Hora de inicio: ____________ Hora de finalización: ____________  °Fecha: ____________ Movimientos: ____________ Hora de inicio: ____________ Hora de finalización: ____________  °Fecha: ____________ Movimientos: ____________ Hora de inicio: ____________ Hora de finalización: ____________  °Fecha: ____________ Movimientos: ____________ Hora  de inicio: ____________ Hora de finalización: ____________  °Fecha: ____________ Movimientos: ____________ Hora de inicio: ____________ Hora de finalización: ____________  °Fecha: ____________ Movimientos: ____________ Hora de inicio: ____________ Hora de finalización: ____________  °Fecha: ____________ Movimientos: ____________ Hora de inicio: ____________ Hora de   finalizacin: ____________  [name]: ____________ Movimientos: ____________ [name] inicio: ____________ [name] finalizacin: ____________  [name]: ____________ Movimientos: ____________ [name] inicio: ____________ [name] finalizacin: ____________  [name]: ____________ Movimientos: ____________ [name] inicio: ____________ [name] finalizacin: ____________  [name]: ____________ Movimientos: ____________ [name] inicio: ____________ [name] finalizacin: ____________  [name]: ____________ Movimientos: ____________ [name] inicio: ____________ [name] finalizacin: ____________  [name]: ____________ Movimientos: ____________ [name] inicio: ____________ [name] de finalizacin: ____________  [name]: ____________ Movimientos: ____________ [name] de inicio: ____________ [name] de finalizacin: ____________  [name]: ____________ Movimientos: ____________ [name] de inicio: ____________ [name] de finalizacin: ____________  [name]: ____________ Movimientos: ____________ [name] de inicio: ____________ [name] de finalizacin: ____________  [name]: ____________ Movimientos: ____________ [name] de inicio: ____________ [name] de finalizacin: ____________  [name]: ____________ Movimientos: ____________ [name] de inicio: ____________ [name] de finalizacin: ____________  [name]: ____________ Movimientos: ____________ [name] de inicio: ____________ [name] de finalizacin: ____________  [name]: ____________ Movimientos: ____________ [name] de inicio: ____________ [name] de finalizacin: ____________  [name]: ____________ Movimientos: ____________ [name] de inicio: ____________ [name] de finalizacin:  ____________  [name]: ____________ Movimientos: ____________ [name] de inicio: ____________ [name] de finalizacin: ____________  [name]: ____________ Movimientos: ____________ [name] de inicio: ____________ [name] de finalizacin: ____________  [name]: ____________ Movimientos: ____________ [name] de inicio: ____________ [name] de finalizacin: ____________  [name]: ____________ Movimientos: ____________ [name] de inicio: ____________ [name] de finalizacin: ____________  [name]: ____________ Movimientos: ____________ [name] de inicio: ____________ [name] de finalizacin: ____________  [name]: ____________ Movimientos: ____________ [name] de inicio: ____________ [name] de finalizacin: ____________  [name]: ____________ Movimientos: ____________ [name] de inicio: ____________ [name] de finalizacin: ____________  [name]: ____________ Movimientos: ____________ [name] de inicio: ____________ [name] de finalizacin: ____________  [name]: ____________ Movimientos: ____________ [name] de inicio: ____________ [name] de finalizacin: ____________  [name]: ____________ Movimientos: ____________ [name] de inicio: ____________ [name] de finalizacin: ____________  [name]: ____________ Movimientos: ____________ [name] de inicio: ____________ [name] de finalizacin: ____________  [name]: ____________ Movimientos: ____________ [name] de inicio: ____________ [name] de finalizacin: ____________  [name]: ____________ Movimientos: ____________ [name] de inicio: ____________ [name] de finalizacin: ____________  [name]: ____________ Movimientos: ____________ [name] de inicio: ____________ [name] de finalizacin: ____________  [name]: ____________ Movimientos: ____________ [name] de inicio: ____________ [name] de finalizacin: ____________  [name]: ____________ Movimientos: ____________ [name] de inicio: ____________ [name] de finalizacin: ____________  [name]: ____________ Movimientos: ____________ [name] de inicio: ____________ [name] de finalizacin: ____________  [name]: ____________  Movimientos: ____________ [name] de inicio: ____________ [name] de finalizacin: ____________  [name]: ____________ Movimientos: ____________ [name] de inicio: ____________ [name] de finalizacin: ____________  [name]: ____________ Movimientos: ____________ [name] de inicio: ____________ [name] de finalizacin: ____________  Document Released: 12/27/2007 Document Revised: 09/05/2012 ExitCare Patient Information 2015 [place], [place]. This information is not intended to replace advice given to you by your health care provider. Make sure you discuss any questions you have with your health care provider. Parto natural (Natural Childbirth) Un parto natural es un parto en el que no se utilizan medicamentos para Chief Technology Officer ni anestesia (epidural o espinal). Tampoco se utilizan monitores fetales, no se realiza cesrea (una incisin en la parte inferior del abdomen) ni episiotoma (un corte en la parte inferior de la vagina). Con la ayuda de una persona especializada en partos (partera), usted dirigir su propio parto como desee. Muchas mujeres eligen Medical sales representative parto natural porque se sienten que pueden controlar mejor el proceso y Theme park manager en contacto con el parto y el nacimiento. Tambin lo hacen por preocupacin con respecto al [employer] que puedan tener los medicamentos en ellas  y el beb. Las mujeres con un Psychiatristembarazo de alto riesgo no deben TEFL teacherintentar realizar un parto natural. Es mejor Health visitorrealizar el parto en un hospital por si aparece alguna situacin de Associate Professoremergencia. El mdico interviene por la salud y la seguridad de la madre y el beb. DOS TCNICAS PARA EL PARTO NATURAL:   El mtodo [place] fomenta a las mujeres que tener un beb es normal, saludable y natural. Tambin se ensea a la madre a Warehouse managertener una postura neutral con respecto a los medicamentos para Chief Technology Officerel dolor y la anestesia y tomar una decisin informada de si es lo correcto para ellas y del momento adecuado.  El mtodo [place] (tambin llamado Parto Asistido por Avanell Shackletonel Padre)  fomenta al padre a ser el asistente en el parto y sostiene un enfoque natural. Tambin fomenta el ejercicio y Neomia Dearuna dieta balanceada. Los ejercicios ensean relajacin y tcnicas de respiracin profunda. Sin embargo, existen clases para preparar a los padres para una situacin de Associate Professoremergencia. FORMAS DE CONTROLAR EL DOLOR EN EL PARTO:  Meditacin.  Yoga.  Hipnosis.  Acupuntura.  Masajes.  Cambiar posiciones (caminar, mecerse, baarse, recostarse sobre la pelota de dilatacin).  Recostarse en agua tibia o en un jacuzzi.  Realice algn tipo de actividad que ayude a enfocarse en otra cosa que no sea el parto.  Escuchar msica suave.  Imgenes visuales (enfocarse en un objeto particular). ANTES DE COMENZAR EL PARTO  Asegrese de que usted y su esposo/compaero estn de acuerdo en llevar un parto natural.  Decida si el profesional que lo asiste o una partera asistir en el parto.  Decida si tendr el beb en un hospital, en una sala de partos o en su hogar.  Si tiene hijos, haga planes para que alguien los cuide cuando usted est en el hospital.  Halliburton CompanyConozca la distancia y el tiempo que toma llegar a la sala de [place]parto. Vaya previamente para asegurarse.  Tenga preparado un bolso con un camisn, una bata y artculos de tocador para llevar cuando comience el parto.  Tenga los nmeros telefnicos de sus amigos y familia a mano por si necesita llamar a alguien cuando Film/video editorcomience el parto.  Su esposo/compaero debe concurrir a todas las clases.  Hable con el profesional acerca de la posibilidad de una emergencia mdica y qu pasar si esto ocurre. VENTAJAS DEL PARTO NATURAL  Usted tiene el control del parto.  Es seguro.  No se utilizan medicamentos o anestesias que puedan afectar al beb.  No hay procedimientos invasivos como la episiotoma.  Usted y su compaero trabajarn juntos y eso fortalecer la relacin.  La meditacin, el yoga, los masajes y los ejercicios de respiracin pueden  aprenderse mientras est embarazada y ayudarla cuando est realizando el parto.  En la mayor parte de las salas de parto, la familia y amigos pueden involucrarse en el parto y el proceso de alumbramiento. DESVENTAJAS DEL PARTO NATURAL  Sentir Optometristdolor durante el parto.  Los mtodos (descritos anteriormente) para ayudar a Engineer, materialsaliviar el dolor pueden no funcionar en su caso particular.  Puede sentirse avergonzada y frustrada si decide cambiar de idea durante el parto y no tener un parto natural. DESPUS DEL PARTO  Estar muy cansada.  Sentir Federal-Mogulmolestias debido a las contracciones del tero. Sentir dolor alrededor de Sales executivela vagina.  Es posible que sienta fro y St. Josephtemblores, es una reaccin natural.  Se sentir Dodge Centeremocionada, Malawiabrumada, Kazakhstanexitosa y Yah-ta-heyorgullosa de ser [place] Lakemadre. INSTRUCCIONES PARA EL CUIDADO DOMICILIARIO  Siga los consejos e indicaciones del profesional que la  asiste.  Siga las indicaciones de Pension scheme manager de parto natural (mtodo [place] o [place]). Document Released: 12/16/2008 Document Revised: 12/12/2011 [facility] Patient Information 2015 [place], [place]. This information is not intended to replace advice given to you by your health care provider. Make sure you discuss any questions you have with your health care provider. Fetal Movement Counts Patient Name: __________________________________________________ Patient Due Date: ____________________ Performing a fetal movement count is highly recommended in high-risk pregnancies, but it is good for every pregnant woman to do. Your caregiver may ask you to start counting fetal movements at 28 weeks of the pregnancy. Fetal movements often increase:  After eating a full meal.  After physical activity.  After eating or drinking something sweet or cold.  At rest. Pay attention to when you feel the baby is most active. This will help you notice a pattern of your baby's sleep and wake cycles and what factors contribute to an increase in fetal  movement. It is important to perform a fetal movement count at the same time each day when your baby is normally most active.  HOW TO COUNT FETAL MOVEMENTS 1. Find a quiet and comfortable area to sit or lie down on your left side. Lying on your left side provides the best blood and oxygen circulation to your baby. 2. Write down the day and time on a sheet of paper or in a journal. 3. Start counting kicks, flutters, swishes, rolls, or jabs in a 2 hour period. You should feel at least 10 movements within 2 hours. 4. If you do not feel 10 movements in 2 hours, wait 2-3 hours and count again. Look for a change in the pattern or not enough counts in 2 hours. SEEK MEDICAL CARE IF:  You feel less than 10 counts in 2 hours, tried twice.  There is no movement in over an hour.  The pattern is changing or taking longer each day to reach 10 counts in 2 hours.  You feel the baby is not moving as he or she usually does. Date: ____________ Movements: ____________ Start time: ____________ Doreatha Martin time: ____________  Date: ____________ Movements: ____________ Start time: ____________ Doreatha Martin time: ____________ Date: ____________ Movements: ____________ Start time: ____________ Doreatha Martin time: ____________ Date: ____________ Movements: ____________ Start time: ____________ Doreatha Martin time: ____________ Date: ____________ Movements: ____________ Start time: ____________ Doreatha Martin time: ____________ Date: ____________ Movements: ____________ Start time: ____________ Doreatha Martin time: ____________ Date: ____________ Movements: ____________ Start time: ____________ Doreatha Martin time: ____________ Date: ____________ Movements: ____________ Start time: ____________ Doreatha Martin time: ____________  Date: ____________ Movements: ____________ Start time: ____________ Doreatha Martin time: ____________ Date: ____________ Movements: ____________ Start time: ____________ Doreatha Martin time: ____________ Date: ____________ Movements: ____________ Start time:  ____________ Doreatha Martin time: ____________ Date: ____________ Movements: ____________ Start time: ____________ Doreatha Martin time: ____________ Date: ____________ Movements: ____________ Start time: ____________ Doreatha Martin time: ____________ Date: ____________ Movements: ____________ Start time: ____________ Doreatha Martin time: ____________ Date: ____________ Movements: ____________ Start time: ____________ Doreatha Martin time: ____________  Date: ____________ Movements: ____________ Start time: ____________ Doreatha Martin time: ____________ Date: ____________ Movements: ____________ Start time: ____________ Doreatha Martin time: ____________ Date: ____________ Movements: ____________ Start time: ____________ Doreatha Martin time: ____________ Date: ____________ Movements: ____________ Start time: ____________ Doreatha Martin time: ____________ Date: ____________ Movements: ____________ Start time: ____________ Doreatha Martin time: ____________ Date: ____________ Movements: ____________ Start time: ____________ Doreatha Martin time: ____________ Date: ____________ Movements: ____________ Start time: ____________ Doreatha Martin time: ____________  Date: ____________ Movements: ____________ Start time: ____________ Doreatha Martin time: ____________ Date: ____________ Movements: ____________ Start time: ____________ Doreatha Martin time: ____________ Date: ____________ Movements: ____________  Start time: ____________ Doreatha Martin time: ____________ Date: ____________ Movements: ____________ Start time: ____________ Doreatha Martin time: ____________ Date: ____________ Movements: ____________ Start time: ____________ Doreatha Martin time: ____________ Date: ____________ Movements: ____________ Start time: ____________ Doreatha Martin time: ____________ Date: ____________ Movements: ____________ Start time: ____________ Doreatha Martin time: ____________  Date: ____________ Movements: ____________ Start time: ____________ Doreatha Martin time: ____________ Date: ____________ Movements: ____________ Start time: ____________ Doreatha Martin time: ____________ Date:  ____________ Movements: ____________ Start time: ____________ Doreatha Martin time: ____________ Date: ____________ Movements: ____________ Start time: ____________ Doreatha Martin time: ____________ Date: ____________ Movements: ____________ Start time: ____________ Doreatha Martin time: ____________ Date: ____________ Movements: ____________ Start time: ____________ Doreatha Martin time: ____________ Date: ____________ Movements: ____________ Start time: ____________ Doreatha Martin time: ____________  Date: ____________ Movements: ____________ Start time: ____________ Doreatha Martin time: ____________ Date: ____________ Movements: ____________ Start time: ____________ Doreatha Martin time: ____________ Date: ____________ Movements: ____________ Start time: ____________ Doreatha Martin time: ____________ Date: ____________ Movements: ____________ Start time: ____________ Doreatha Martin time: ____________ Date: ____________ Movements: ____________ Start time: ____________ Doreatha Martin time: ____________ Date: ____________ Movements: ____________ Start time: ____________ Doreatha Martin time: ____________ Date: ____________ Movements: ____________ Start time: ____________ Doreatha Martin time: ____________  Date: ____________ Movements: ____________ Start time: ____________ Doreatha Martin time: ____________ Date: ____________ Movements: ____________ Start time: ____________ Doreatha Martin time: ____________ Date: ____________ Movements: ____________ Start time: ____________ Doreatha Martin time: ____________ Date: ____________ Movements: ____________ Start time: ____________ Doreatha Martin time: ____________ Date: ____________ Movements: ____________ Start time: ____________ Doreatha Martin time: ____________ Date: ____________ Movements: ____________ Start time: ____________ Doreatha Martin time: ____________ Date: ____________ Movements: ____________ Start time: ____________ Doreatha Martin time: ____________  Date: ____________ Movements: ____________ Start time: ____________ Doreatha Martin time: ____________ Date: ____________ Movements: ____________ Start  time: ____________ Doreatha Martin time: ____________ Date: ____________ Movements: ____________ Start time: ____________ Doreatha Martin time: ____________ Date: ____________ Movements: ____________ Start time: ____________ Doreatha Martin time: ____________ Date: ____________ Movements: ____________ Start time: ____________ Doreatha Martin time: ____________ Date: ____________ Movements: ____________ Start time: ____________ Doreatha Martin time: ____________ Document Released: 10/19/2006 Document Revised: 09/05/2012 Document Reviewed: 07/16/2012 ExitCare Patient Information 2015 [place]. This information is not intended to replace advice given to you by your health care provider. Make sure you discuss any questions you have with your health care provider.

## 2014-04-12 NOTE — [facility] Note (Signed)
Blood tinged mucus earlier.  Was told my cervix was thick but don't the dilation from yesterday's dr visit.  No leaking.  Baby moving well. Contractions stronger.

## 2014-04-12 NOTE — H&P (Signed)
[name] is a 28 y.o. female [phone] with IUP at 387w0d presenting for Onset of Labor. Pt states she has been having regular, every 2-3 minutes contractions, associated with none vaginal bleeding.  Membranes are intact, with active fetal movement.   PNCare at [facility] since 7 wks  Prenatal History/Complications: Pt. Is a 28 y/o G3P3003 at 537w0d who presents today in active labor. She denies complications with this pregnancy, and says that her previous two pregnancies were without complications. She was checked in the MAU and found to be 8cm dilated, 100% effaced, and 0 station. She was admitted to L&D where she delivered a healthy baby boy shortly thereafter via NSVD. Her past medical history is only significant for Bulemia. She has normal postpartum pain at this time, bleeding is controlled. She has no other complaints   Gestations:  1. 2007, 6#6oz, 41wks, Vaginal 2. 2011, 7#4oz. 38wks, Vaginal   Past Medical History: Past Medical History  Diagnosis Date  . Eating disorder   . Bulimia     Past Surgical History: Past Surgical History  Procedure Laterality Date  . No past surgeries      Obstetrical History: OB History   Grav Para Term Preterm Abortions TAB SAB Ect Mult Living   3 2 2       2       Gynecological History: OB History   Grav Para Term Preterm Abortions TAB SAB Ect Mult Living   3 2 2       2       Social History: History   Social History  . Marital Status: Married    Spouse Name: N/A    Number of Children: N/A  . Years of Education: N/A   Social History Main Topics  . Smoking status: Never Smoker   . Smokeless tobacco: Never Used  . Alcohol Use: No  . Drug Use: No  . Sexual Activity: Yes    Birth Control/ Protection: None   Other Topics Concern  . None   Social History Narrative   Lives in [place] since 2005 with husband and 2 children (2011, 2008). Moved from [place] in 2005. Spanish speaking.     Family History: Family History   Problem Relation Age of Onset  . Hearing loss Neg Hx     Allergies: No Known Allergies  Prescriptions prior to admission  Medication Sig Dispense Refill  . acetaminophen (TYLENOL) 500 MG tablet Take 500 mg by mouth daily as needed for mild pain or headache.       . Prenatal Vit-Fe Fumarate-FA (PRENATAL MULTIVITAMIN) TABS tablet Take 1 tablet by mouth daily at 12 noon.         Review of Systems   Constitutional: Per HPI  Blood pressure 122/60, pulse 138, temperature 97.7 F (36.5 C), temperature source Oral, resp. rate 20, height 5' 1.25" (1.556 m), weight 74.844 kg (165 lb), last menstrual period 06/26/2013. General appearance: alert, cooperative and no distress Lungs: clear to auscultation bilaterally Heart: regular rate and rhythm Abdomen: soft, non-tender; bowel sounds normal Pelvic: s/p delivery Extremities: Homans sign is negative, no sign of DVT Grossly neurologically intact.  Presentation: cephalic Fetal monitoring: s/p delivery of healthy baby boy Dilation: 10 Effacement (%): 90 Station: 0 Exam by:: dr odom   Prenatal labs: ABO, Rh: O/Positive/-- (01/08 0000) Antibody: Negative (01/08 0000) Rubella:   Immune RPR: Nonreactive (01/08 0000)  HBsAg: Negative (01/08 0000)  HIV: Non-reactive (01/08 0000)  GBS: Negative (06/17 0000)  1 hr Glucola  123 Genetic screening  Normal Anatomy US Normal female    Prenatal Transfer Tool  Maternal Diabetes: No Genetic Screening: Normal Maternal Ultrasounds/Referrals: Normal Fetal Ultrasounds or other Referrals:  None Maternal Substance Abuse:  No Significant Maternal Medications:  None Significant Maternal Lab Results: Lab values include: Group B Strep negative     Results for orders placed during the hospital encounter of 04/12/14 (from the past 24 hour(s))  CBC   Collection Time    04/12/14  5:00 PM      Result Value Ref Range   WBC 15.8 (*) 4.0 - 10.5 K/uL   RBC 4.60  3.87 - 5.11 MIL/uL   Hemoglobin 14.0   12.0 - 15.0 g/dL   HCT 16.1  09.6 - 04.5 %   MCV 88.3  78.0 - 100.0 fL   MCH 30.4  26.0 - 34.0 pg   MCHC 34.5  30.0 - 36.0 g/dL   RDW 40.9  81.1 - 91.4 %   Platelets 312  150 - 400 K/uL    Assessment: [name] is a 28 y.o. G3P2002 at [redacted]w[redacted]d by L= 7wk u/s  here for Active Labor #Labor: s/p delivery. To Mother Baby unit #Pain: No epidural or fentanyl at time of delivery, fentanyl immediately after delivery, and percocet and motrin prn for pain control  #FWB: Healthy baby boy #ID:  GBS negative #MOF: Will discuss #MOC: Will discuss #Circ:  Will discuss.   [name], Kendra Hunter 04/12/2014, 6:04 PM  I spoke with and examined patient and agree with resident's note and plan of care.  Kendra Scale, MD OB Fellow 04/12/2014 7:55 PM   `````Attestation of Attending Supervision of Advanced Practitioner: Evaluation and management procedures were performed by the PA/NP/CNM/OB Fellow under my supervision/collaboration. Chart reviewed and agree with management and plan.  [name] 04/13/2014 8:56 AM

## 2014-04-12 NOTE — MAU Note (Addendum)
Pt presents to MAU with complaints of contractions that started yesterday morning. Denies any vaginal bleeding or LOF

## 2014-04-12 NOTE — MAU Note (Signed)
Per Audie PintoAHarker, RN charge, pt to go to room 166.

## 2014-04-12 NOTE — Progress Notes (Signed)
[name] notified pt taken to room 166. Cervix 8cm, 0 station, BBOW. Will see pt and put in orders.

## 2014-04-13 LAB — CBC
HCT: 35.1 % — ABNORMAL LOW (ref 36.0–46.0)
Hemoglobin: 12 g/dL (ref 12.0–15.0)
MCH: 30.4 pg (ref 26.0–34.0)
MCHC: 34.2 g/dL (ref 30.0–36.0)
MCV: 88.9 fL (ref 78.0–100.0)
Platelets: 269 10*3/uL (ref 150–400)
RBC: 3.95 MIL/uL (ref 3.87–5.11)
RDW: 14.3 % (ref 11.5–15.5)
WBC: 15.4 10*3/uL — AB (ref 4.0–10.5)

## 2014-04-13 LAB — RPR

## 2014-04-13 MED ORDER — IBUPROFEN 600 MG PO TABS: 600.0000 mg | ORAL_TABLET | Freq: Four times a day (QID) | ORAL | Status: DC | PRN

## 2014-04-13 NOTE — Lactation Note (Signed)
This note was copied from the chart of [name]. Lactation Consultation Note Initial visit at 24 hours of age.  MBU RN reports mom will be discharged soon.  Baby has had several feeding with adequate output.  Mom says the nurse told her the baby needs to open her mouth better.  Mom has been using cradle hold, we discussed cross cradle as a way to latch baby with open mouth, mom receptive and reports she knows football hold.  Discussed proper latch and mom denies pain.  [facility] given and discussed.  Encouraged to feed with early cues on demand.  Early newborn behavior discussed.  Hand expression demonstrated by mom with colostrum flowing out of breasts.  MOm reports just finishing a feeding and when encouraged to place baby STS for pending heal stick mom was reluctant because baby is now asleep.  [facility].     Patient Name: Kendra Delaney MeigsRosario Geralds Today's Date: 04/13/2014 Reason for consult: Initial assessment   Maternal Data Has patient been taught Hand Expression?: Yes Does the patient have breastfeeding experience prior to this delivery?: Yes  Feeding Feeding Type: Breast Fed  LATCH Score/Interventions Latch: Grasps breast easily, tongue down, lips flanged, rhythmical sucking.  Audible Swallowing: A few with stimulation  Type of Nipple: Everted at rest and after stimulation  Comfort (Breast/Nipple): Soft / non-tender     Hold (Positioning): Assistance needed to correctly position infant at breast and maintain latch.  LATCH Score: 8  Lactation Tools Discussed/Used     Consult Status Consult Status: Complete    Parvin Stetzer, Arvella MerlesJana Lynn 04/13/2014, 5:46 PM

## 2014-04-13 NOTE — Discharge Summary (Signed)
Obstetric Discharge Summary Reason for Admission: onset of labor Prenatal Procedures: ultrasound Intrapartum Procedures: spontaneous vaginal delivery Postpartum Procedures: none Complications-Operative and Postpartum: none Hemoglobin  Date Value Ref Range Status  04/13/2014 12.0  12.0 - 15.0 g/dL Final     HCT  Date Value Ref Range Status  04/13/2014 35.1* 36.0 - 46.0 % Final    Physical Exam:  General: alert, cooperative and no distress Lochia: appropriate Uterine Fundus: firm Incision: n/a DVT Evaluation: No evidence of DVT seen on physical exam.  Discharge Diagnoses: Term Pregnancy-delivered  Discharge Information: Date: 04/13/2014 Activity: pelvic rest Diet: routine Medications: PNV and Ibuprofen Condition: stable Instructions: refer to practice specific booklet Discharge to: home Follow-up Information   Follow up with [facility] [place] In 4 weeks. (Call to make appointment for your Nexplanon. )    Contact information:   [address] [place] [address] [phone]      Newborn Data: Live born female  Birth Weight: 7 lb 3.7 oz (3280 g) APGAR: 9, 9  Home with mother.  [name] 04/13/2014, 6:15 PM

## 2014-04-13 NOTE — Progress Notes (Signed)
Clinical Social Work Department PSYCHOSOCIAL ASSESSMENT - MATERNAL/CHILD 04/13/2014  Patient:  [name],[name]  Account Number:  [id number]  Admit Date:  04/12/2014  Childs Name:   [name]    Clinical Social Worker:  Vaniyah Lansky, LCSW   Date/Time:  04/13/2014 09:45 AM  Date Referred:  04/12/2014   Referral source  Central Nursery     Referred reason  Depression/Anxiety   Other referral source:    I:  FAMILY / HOME ENVIRONMENT Child's legal guardian:  PARENT  Guardian - Name Guardian - Age Guardian - Address  [name],[name] 28 [address]  [place], [place] [address]  [name]     Other household support members/support persons Other support:    II  PSYCHOSOCIAL DATA Information Source:    Financial and Community Resources Employment:   Spouse is employed   Financial resources: Plan to apply for  Medicaid If Medicaid - County:   Other  WIC  Food Stamps   School / Grade:   Maternity Care Coordinator / Child Services Coordination / Early Interventions:  Cultural issues impacting care:    III  STRENGTHS Strengths  Supportive family/friends  Home prepared for Child (including basic supplies)  Adequate Resources   Strength comment:    IV  RISK FACTORS AND CURRENT PROBLEMS Current Problem:       V  SOCIAL WORK ASSESSMENT Acknowledged order for social work consult to assess mother's hx of anxiety and bulemia.   Met with mother who was pleasant and receptive to CSW.  She is from [place] and speaks limited English.  Spanish interpreter Jessica facilitated the interview.  Mother acknowledges hx of bulimia stating that it was a long time ago.  Informed that she was being prescribed medication for anxiety prior to the pregnancy.  Informed that she was treated at a clinic for the anxiety, but she was unsure of the facility name. She denies any current symptoms and states that she will return to the same clinic for treatment  if needed.  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  Currently she denies need for any medication, and notes plan to follow up with her Physician if she begin having symptoms of depression. No acute social concerns reported at this time.  She denies any hx of substance abuse.  Informed of social work availability.      VI SOCIAL WORK PLAN Social Work Plan  No Further Intervention Required / No Barriers to Discharge    

## 2014-04-13 NOTE — Progress Notes (Addendum)
Interpreter here for visit Post Partum Day #1 [id number] NSVD with mild PPH/atony Subjective: no complaints, up ad lib, voiding and tolerating PO. Breastfeeding well. Lochia scant. Ambulatory w/o orthostatic sx.   Objective: Blood pressure 111/66, pulse 91, temperature 98.3 F (36.8 C), temperature source Oral, resp. rate 18, height 5' 1.25" (1.556 m), weight 74.844 kg (165 lb), last menstrual period 06/26/2013, SpO2 99.00%, unknown if currently breastfeeding.  Physical Exam:  General: alert, cooperative and no distress Breasts; soft Lochia: appropriate Uterine Fundus: firm Incision: n/a DVT Evaluation: No evidence of DVT seen on physical exam.   Recent Labs  04/12/14 1700 04/13/14 0545  HGB 14.0 12.0  HCT 40.6 35.1*     Assessment/Plan: Normal PP, multip, GBS neg Would like discharge this evening if baby can go.  Plans abstinence until Nexplanon   LOS: 1 day   [name] 04/13/2014, 12:08 PM

## 2014-04-13 NOTE — Discharge Instructions (Signed)
Parto vaginal, Cuidados posteriores  °(Vaginal Delivery, Care After) °Siga estas instrucciones durante las próximas semanas. Estas indicaciones para el alta le proporcionan información general acerca de cómo deberá cuidarse después del parto. El médico también podrá darle instrucciones específicas. El tratamiento ha sido planificado según las prácticas médicas actuales, pero en algunos casos pueden ocurrir problemas. Comuníquese con el médico si tiene algún problema o tiene preguntas al volver a su casa.  °INSTRUCCIONES PARA EL CUIDADO EN EL HOGAR  °· Tome sólo medicamentos de venta libre o recetados, según las indicaciones del médico o del farmacéutico. °· No beba alcohol, especialmente si está amamantando o toma analgésicos. °· No mastique tabaco ni fume. °· No consuma drogas. °· Continúe con un adecuado cuidado perineal. El buen cuidado perineal incluye: °¨ Higienizarse de adelante hacia atrás. °¨ Mantener la zona perineal limpia. °· No use tampones ni duchas vaginales hasta que su médico la autorice. °· Dúchese, lávese el cabello y tome baños de inmersión según las indicaciones de su médico. °· Utilice un sostén que le ajuste bien y que brinde buen soporte a sus mamas. °· Consuma alimentos saludables. °· Beba suficiente líquido para mantener la orina clara o de color amarillo pálido. °· Consuma alimentos ricos en fibra como cereales y panes integrales, arroz, frijoles y frutas y verduras frescas todos los días. Estos alimentos pueden ayudarla a prevenir o aliviar el estreñimiento. °· Siga las recomendaciones de su médico relacionadas con la reanudación de actividades como subir escaleras, conducir automóviles, levantar objetos, hacer ejercicios o viajar. °· Hable con su médico acerca de reanudar la actividad sexual. Volver a la actividad sexual depende del riesgo de infección, la velocidad de la curación y la comodidad y su deseo de reanudarla. °· Trate de que alguien la ayude con las actividades del hogar y con  el recién nacido al menos durante un par de días después de salir del hospital. °· Descanse todo lo que pueda. Trate de descansar o tomar una siesta mientras el bebé está durmiendo. °· Aumente sus actividades gradualmente. °· Cumpla con todas las visitas de control programadas para después del parto. Es muy importante asistir a todas las citas programadas de seguimiento. En estas citas, su médico va a controlarla para asegurarse de que esté sanando física y emocionalmente. °SOLICITE ATENCIÓN MÉDICA SI:  °· Elimina coágulos grandes por la vagina. Guarde algunos coágulos para mostrarle al médico. °· Tiene una secreción con feo olor que proviene de la vagina. °· Tiene dificultad para orinar. °· Orina con frecuencia. °· Siente dolor al orinar. °· Nota un cambio en sus movimientos intestinales. °· Aumenta el enrojecimiento, el dolor o la hinchazón en la zona de la incisión vaginal (episiotomía) o el desgarro vaginal. °· Tiene pus que drena por la episiotomía o el desgarro vaginal. °· La episiotomía o el desgarro vaginal se abren. °· Sus mamas le duelen, están duras o enrojecidas. °· Sufre un dolor intenso de cabeza. °· Tiene visión borrosa o ve manchas. °· Se siente triste o deprimida. °· Tiene pensamientos acerca de lastimarse o dañar al recién nacido. °· Tiene preguntas acerca de su cuidado personal, el cuidado del recién nacido o acerca de los medicamentos. °· Se siente mareada o sufre un desmayo. °· Tiene una erupción. °· Tiene náuseas o vómitos. °· Usted amamantó al bebé y no ha tenido su período menstrual dentro de las 12 semanas después de dejar de amamantar. °· No amamanta al bebé y no tuvo su período menstrual en las últimas 12° semanas después del   partoLance Muss.  Tiene fiebre. SOLICITE ATENCIN MDICA DE INMEDIATO SI:   Siente dolor persistente.  Siente dolor en el pecho.  Le falta el aire.  Se desmaya.  Siente dolor en la pierna.  Siente Physiological scientistdolor en el estmago.  El sangrado vaginal satura dos o ms  apsitos en 1 hora. ASEGRESE DE QUE:   Comprende estas instrucciones.  Controlar su enfermedad.  Recibir ayuda de inmediato si no mejora o si empeora. Document Released: 09/19/2005 Document Revised: 05/22/2013 [facility] Patient Information 2015 [place] [place]. This information is not intended to replace advice given to you by your health care provider. Make sure you discuss any questions you have with your health care provider. No sex until you get Nexplanon.

## 2014-04-13 NOTE — Lactation Note (Signed)
This note was copied from the chart of Kendra Delaney MeigsRosario Opheim. Lactation Consultation Note  Patient Name: [name] Today's Date: 04/13/2014     Maternal Data Formula Feeding for Exclusion: Yes Reason for exclusion: Mother's choice to formula and breast feed on admission  Feeding Feeding Type: Breast Fed Length of feed: 45 min  LATCH Score/Interventions Latch: Repeated attempts needed to sustain latch, nipple held in mouth throughout feeding, stimulation needed to elicit sucking reflex.  Audible Swallowing: A few with stimulation Intervention(s): Skin to skin;Hand expression  Type of Nipple: Everted at rest and after stimulation  Comfort (Breast/Nipple): Soft / non-tender     Hold (Positioning): No assistance needed to correctly position infant at breast.  LATCH Score: 8  Lactation Tools Discussed/Used     Consult Status      Alfred LevinsLee, [name] Anne 04/13/2014, 8:33 AM

## 2014-04-16 NOTE — Progress Notes (Signed)
Post discharge chart review completed.  

## 2014-05-10 ENCOUNTER — Inpatient Hospital Stay (HOSPITAL_COMMUNITY)
Admission: AD | Admit: 2014-05-10 | Discharge: 2014-05-10 | Disposition: A | Discharge status: Home / Self Care | Payer: Medicaid Other | Source: Ambulatory Visit | Attending: Family Medicine | Admitting: Family Medicine

## 2014-05-10 ENCOUNTER — Encounter (HOSPITAL_COMMUNITY): Payer: Self-pay | Admitting: *Deleted

## 2014-05-10 DIAGNOSIS — K5900 Constipation, unspecified: Secondary | ICD-10-CM | POA: Insufficient documentation

## 2014-05-10 DIAGNOSIS — M79605 Pain in left leg: Secondary | ICD-10-CM

## 2014-05-10 DIAGNOSIS — M79609 Pain in unspecified limb: Secondary | ICD-10-CM | POA: Insufficient documentation

## 2014-05-10 DIAGNOSIS — M79604 Pain in right leg: Secondary | ICD-10-CM

## 2014-05-10 DIAGNOSIS — M5430 Sciatica, unspecified side: Secondary | ICD-10-CM

## 2014-05-10 NOTE — MAU Note (Signed)
Pt here for pain in legs and feet. Points to pain from hips down.

## 2014-05-10 NOTE — MAU Provider Note (Signed)
  History     CSN: [id number]  Arrival date and time: 05/10/14 1231   First Provider Initiated Contact with Patient 05/10/14 1321      Chief Complaint  Patient presents with  . Foot Pain   Foot Pain    [name] is a 28 y.o. a G3P3003 who is S/P NSVD x 4 weeks. She presents today with bilateral leg pain. She states that she has pain from her hips to her toes on both legs. She states that the pain runs down the front of each leg. Occasionally on the left leg she will have shooting pain from the buttocks down. She has taken ibuprofen for the pain, and sometimes it will help. She also reports constipation. She is still taking a PNV with iron.   Past Medical History  Diagnosis Date  . Eating disorder   . Bulimia     Past Surgical History  Procedure Laterality Date  . No past surgeries      Family History  Problem Relation Age of Onset  . Hearing loss Neg Hx     History  Substance Use Topics  . Smoking status: Never Smoker   . Smokeless tobacco: Never Used  . Alcohol Use: No    Allergies: No Known Allergies  Prescriptions prior to admission  Medication Sig Dispense Refill  . ibuprofen (ADVIL,MOTRIN) 600 MG tablet Take 1 tablet (600 mg total) by mouth every 6 (six) hours as needed.  30 tablet  1    ROS Physical Exam   Blood pressure 101/67, pulse 83, temperature 98.2 F (36.8 C), temperature source Oral, resp. rate 18, height 5\' 1"  (1.549 m), weight 65.772 kg (145 lb), unknown if currently breastfeeding.  Physical Exam  Nursing note and vitals reviewed. Constitutional: She is oriented to person, place, and time. She appears well-developed and well-nourished. No distress.  Cardiovascular: Normal rate.   Respiratory: Effort normal.  GI: Soft. There is no tenderness.  Musculoskeletal: Normal range of motion. She exhibits no edema and no tenderness.  Both legs, non-tender. No edema. No erythema. No unilateral calf edema or erythema.   Neurological: She is alert  and oriented to person, place, and time.  Skin: Skin is warm and dry.  Psychiatric: She has a normal mood and affect.    MAU Course  Procedures   Assessment and Plan   1. Sciatic leg pain   2. Leg pain, bilateral    Tylenol/ibuprofen for the pain Stretching exercises discussed Stop PNV with iron to help with constipation  Follow-up Information   [facility] [place]. (As scheduled)    Contact information:   [address] [place] [address] [phone]       Tawnya CrookHogan, Deniqua Perry Donovan 05/10/2014, 1:37 PM

## 2014-05-10 NOTE — MAU Provider Note (Signed)
Attestation of Attending Supervision of Advanced Practitioner (PA/CNM/NP): Evaluation and management procedures were performed by the Advanced Practitioner under my supervision and collaboration.  I have reviewed the Advanced Practitioner's note and chart, and I agree with the management and plan.  Alli Jasmer S, MD [facility] Attending 05/10/2014 3:14 PM   

## 2014-05-10 NOTE — Discharge Instructions (Signed)
Ejercicios para la espalda °(Back Exercises) °Estos ejercicios ayudan a tratar y prevenir lesiones en la espalda. El objetivo es aumentar la fuerza de los músculos abdominales y dorsales y la flexibilidad de la espalda. Debe comenzar con estos ejercicios cuando ya no tenga dolor. Los ejercicios para la espalda incluyen: °· Inclinación de la pelvis - Recuéstese sobre la espalda con las rodillas flexionadas. Incline la pelvis hasta que la parte inferior de la espalda se apoye en el piso. Mantenga esta posición durante 5 a 10 segundos y repita entre 5 y 10 veces. °· Rodilla al pecho - Empuje primero una rodilla contra el pecho y mantenga durante 20 a 30 segundos; repita con la otra rodilla y luego con ambas a la vez. Esto puede realizarlo con la otra pierna extendida o flexionada, del modo en que se sienta más cómodo. °· Abdominales o despegar el cóccix del suelo empleando la musculatura abdominal - Flexione las rodillas 90 grados. Comience inclinando la pelvis y realice un ejercicio abdominal lento y parcial, elevando el tronco sólo entre 30 y 45 grados del suelo. Emplee al menos entre 2 y 3 segundos para cada abdominal. No realice los abdominales con las rodillas extendidas. Si le resulta difícil realizar abdominales parciales, simplemente haga lo que se explicó anteriormente, pero sólo contraiga los músculos abdominales y manténgalos tal como se le ha indicado. °· Inclinación de la cadera - Recuéstese sobre la espalda con las rodillas flexionadas a 90 grados. Empújese con los pies y los hombros mientras eleva la cadera un par de centímetros del suelo, mantenga durante 10 segundos y repita entre 5 y 10 veces. °· Arcos dorsales - Acuéstese sobre el estómago e impulse el tronco hacia atrás sobre los codos flexionados. Presione lentamente con las manos, formando un arco con la zona inferior de la espalda. Repita entre 3 y 5 veces. Al realizar las repeticiones, luego de un tiempo disminuirán la rigidez y las  molestias. °· Elevación de los hombros - Acuéstese hacia abajo con los brazos a los lados del cuerpo. Presione las caderas y el torso contra el suelo mientras eleva lentamente la cabeza y los hombros del suelo. °No exagere con los ejercicios, especialmente en el comienzo. Los ejercicios pueden causar alguna molestia leve en la espalda durante algunos minutos; sin embargo, si el dolor es muy intenso, o dura más de 15 minutos, no siga con la actividad física hasta que consulte al profesional que lo asiste. Los problemas en la espalda mejoran de manera lenta con esta terapia.  °Consulte al profesional para que lo ayude a planificar un programa de ejercicios adecuado para su espalda. °Document Released: 09/19/2005 Document Revised: 12/12/2011 °ExitCare® Patient Information ©2015 ExitCare, LLC. This information is not intended to replace advice given to you by your health care provider. Make sure you discuss any questions you have with your health care provider. °Ciática  °(Sciatica) ° La ciática es el dolor, debilidad, entumecimiento u hormigueo a lo largo del nervio ciático. El nervio comienza en la zona inferior de la espalda y desciende por la parte posterior de cada pierna. El nervio controla los músculos de la parte inferior de la pierna y de la zona posterior de la rodilla, y transmite la sensibilidad a la parte posterior del muslo, la pierna y la planta del pie. La ciática es un síntoma de otras afecciones médicas. Por ejemplo, un daño a los nervios o algunas enfermedades como un disco herniado o un espolón óseo en la columna vertebral, podrían dañarle o presionar en   en el nervio citico. Esto causa dolor, debilidad y otras sensaciones normalmente asociadas con la citica. Generalmente la citica afecta slo un lado del cuerpo. CAUSAS   Disco herniado o desplazado.  Enfermedad degenerativa del disco.  Un sndrome doloroso que compromete un msculo angosto de los glteos (sndrome piriforme).  Lesin o  fractura plvica.  Embarazo.  Tumor (casos raros). SNTOMAS  Los sntomas pueden variar de leves a muy graves. Por lo general, los sntomas descienden desde la zona lumbar a las nalgas y la parte posterior de la pierna. Ellos son:   Hormigueo leve o dolor sordo en la parte inferior de la espalda, la pierna o la cadera.  Adormecimiento en la parte posterior de la pantorrilla o la planta del pie.  Sensacin de KeySpanquemazn en la zona lumbar, la pierna o la cadera.  Dolor agudo en la zona inferior de la espalda, la pierna o la cadera.  Debilidad en las piernas.  Dolor de espalda intenso que Raytheoninhibe los movimientos. Los sntomas pueden empeorar al toser, Engineering geologistestornudar, rer o estar sentado o parado durante Con-waymucho tiempo. Adems, el sobrepeso puede empeorar los sntomas.  DIAGNSTICO  Su mdico le har un examen fsico para buscar los sntomas comunes de la citica. Le pedir que haga algunos movimientos o actividades que activaran el dolor del nervio citico. Para encontrar las causas de la citica podr indicarle otros estudios. Estos pueden ser:   Anlisis de Glenardensangre.  Radiografas.  Pruebas de diagnstico por imgenes, como resonancia magntica o tomografa computada. TRATAMIENTO  El tratamiento se dirige a las causas de la citica. A veces, el tratamiento no es necesario, y Chief Technology Officerel dolor y Environmental health practitionerel malestar desaparecen por s mismos. Si necesita tratamiento, su mdico puede sugerir:   Medicamentos de venta libre para Engineer, materialsaliviar el dolor.  Medicamentos recetados, como antiinflamatorios, relajantes musculares o narcticos.  Aplicacin de calor o hielo en la zona del dolor.  Inyecciones de corticoides para disminuir el dolor, la irritacin y la inflamacin alrededor del nervio.  Reduccin de la Marriottactividad en los perodos de Winfielddolor.  Ejercicios y estiramiento del abdomen para fortalecer y Scientist, clinical (histocompatibility and immunogenetics)mejorar la flexibilidad de la columna vertebral. Su mdico puede sugerirle perder peso si el peso extra empeora el  dolor de espalda.  Fisioterapia.  La ciruga para eliminar lo que presiona o pincha el nervio, como un espoln seo o parte de una hernia de disco. INSTRUCCIONES PARA EL CUIDADO EN EL HOGAR   Slo tome medicamentos de venta libre o recetados para Primary school teachercalmar el dolor o Environmental health practitionerel malestar, segn las indicaciones de su mdico.  Aplique hielo sobre el rea dolorida durante 20 minutos 3-4 veces por da durante los primeras 48-72 horas. Luego intente aplicar calor de la misma manera.  Haga ejercicios, elongue o realice sus actividades habituales, si no le causan ms dolor.  Cumpla con todas las sesiones de fisioterapia, segn le indique su mdico.  Cumpla con todas las visitas de control, segn le indique su mdico.  No use tacones altos o zapatos que no tengan buen apoyo.  Verifique que el colchn no sea muy blando. Un colchn firme Engineer, materialsaliviar el dolor y las Hiouchimolestias. SOLICITE ATENCIN MDICA DE INMEDIATO SI:   Pierde el control de la vejiga o del intestino (incontinencia).  Aumenta la debilidad en la zona inferior de la espalda, la pelvis, las nalgas o las piernas.  Siente irritacin o inflamacin en la espalda.  Tiene sensacin de ardor al ConocoPhillipsorinar.  El dolor empeora cuando se acuesta o lo despierta por la  El dolor es peor del que experimentó en el pasado. °· Dura más de 4 semanas. °· Pierde peso sin motivo de manera súbita. °ASEGÚRESE DE QUE:  °· Comprende estas instrucciones. °· Controlará su enfermedad. °· Solicitará ayuda de inmediato si no mejora o si empeora. °Document Released: 09/19/2005 Document Revised: 03/20/2012 °ExitCare® Patient Information ©2015 ExitCare, LLC. This information is not intended to replace advice given to you by your health care provider. Make sure you discuss any questions you have with your health care provider. ° °

## 2014-06-27 ENCOUNTER — Other Ambulatory Visit: Payer: Self-pay | Admitting: Internal Medicine

## 2014-06-27 DIAGNOSIS — R102 Pelvic and perineal pain: Secondary | ICD-10-CM

## 2014-06-27 DIAGNOSIS — R1011 Right upper quadrant pain: Secondary | ICD-10-CM

## 2014-07-03 ENCOUNTER — Ambulatory Visit
Admission: RE | Admit: 2014-07-03 | Discharge: 2014-07-03 | Disposition: A | Discharge status: Home / Self Care | Payer: No Typology Code available for payment source | Source: Ambulatory Visit | Attending: Internal Medicine | Admitting: Internal Medicine

## 2014-07-03 ENCOUNTER — Other Ambulatory Visit: Payer: Self-pay | Admitting: Internal Medicine

## 2014-07-03 DIAGNOSIS — R1011 Right upper quadrant pain: Secondary | ICD-10-CM

## 2014-07-03 DIAGNOSIS — R102 Pelvic and perineal pain: Secondary | ICD-10-CM

## 2014-08-04 ENCOUNTER — Encounter (HOSPITAL_COMMUNITY): Payer: Self-pay | Admitting: *Deleted

## 2014-09-24 ENCOUNTER — Emergency Department (HOSPITAL_COMMUNITY)
Admission: EM | Admit: 2014-09-24 | Discharge: 2014-09-24 | Disposition: A | Discharge status: Home / Self Care | Payer: No Typology Code available for payment source | Attending: Emergency Medicine | Admitting: Emergency Medicine

## 2014-09-24 ENCOUNTER — Encounter (HOSPITAL_COMMUNITY): Payer: Self-pay

## 2014-09-24 ENCOUNTER — Emergency Department (HOSPITAL_COMMUNITY)
Admission: EM | Admit: 2014-09-24 | Discharge: 2014-09-24 | Disposition: A | Discharge status: Home / Self Care | Payer: No Typology Code available for payment source | Source: Home / Self Care | Attending: Emergency Medicine | Admitting: Emergency Medicine

## 2014-09-24 ENCOUNTER — Encounter (HOSPITAL_COMMUNITY): Payer: Self-pay | Admitting: Emergency Medicine

## 2014-09-24 DIAGNOSIS — Z8659 Personal history of other mental and behavioral disorders: Secondary | ICD-10-CM | POA: Insufficient documentation

## 2014-09-24 DIAGNOSIS — R1031 Right lower quadrant pain: Secondary | ICD-10-CM | POA: Insufficient documentation

## 2014-09-24 DIAGNOSIS — R109 Unspecified abdominal pain: Secondary | ICD-10-CM

## 2014-09-24 DIAGNOSIS — Z3202 Encounter for pregnancy test, result negative: Secondary | ICD-10-CM | POA: Insufficient documentation

## 2014-09-24 DIAGNOSIS — N72 Inflammatory disease of cervix uteri: Secondary | ICD-10-CM | POA: Insufficient documentation

## 2014-09-24 LAB — URINE MICROSCOPIC-ADD ON

## 2014-09-24 LAB — CBC WITH DIFFERENTIAL/PLATELET
BASOS PCT: 0 % (ref 0–1)
Basophils Absolute: 0 10*3/uL (ref 0.0–0.1)
Eosinophils Absolute: 0.2 10*3/uL (ref 0.0–0.7)
Eosinophils Relative: 2 % (ref 0–5)
HEMATOCRIT: 40.8 % (ref 36.0–46.0)
HEMOGLOBIN: 14.2 g/dL (ref 12.0–15.0)
Lymphocytes Relative: 26 % (ref 12–46)
Lymphs Abs: 1.9 10*3/uL (ref 0.7–4.0)
MCH: 30 pg (ref 26.0–34.0)
MCHC: 34.8 g/dL (ref 30.0–36.0)
MCV: 86.1 fL (ref 78.0–100.0)
MONO ABS: 0.5 10*3/uL (ref 0.1–1.0)
MONOS PCT: 7 % (ref 3–12)
NEUTROS PCT: 65 % (ref 43–77)
Neutro Abs: 4.8 10*3/uL (ref 1.7–7.7)
Platelets: 360 10*3/uL (ref 150–400)
RBC: 4.74 MIL/uL (ref 3.87–5.11)
RDW: 12.5 % (ref 11.5–15.5)
WBC: 7.4 10*3/uL (ref 4.0–10.5)

## 2014-09-24 LAB — COMPREHENSIVE METABOLIC PANEL
ALBUMIN: 4.4 g/dL (ref 3.5–5.2)
ALT: 15 U/L (ref 0–35)
ANION GAP: 6 (ref 5–15)
AST: 19 U/L (ref 0–37)
Alkaline Phosphatase: 99 U/L (ref 39–117)
BUN: 8 mg/dL (ref 6–23)
CO2: 22 mmol/L (ref 19–32)
CREATININE: 0.75 mg/dL (ref 0.50–1.10)
Calcium: 9.2 mg/dL (ref 8.4–10.5)
Chloride: 109 mEq/L (ref 96–112)
GFR calc Af Amer: >90 mL/min (ref >90)
GFR calc non Af Amer: >90 mL/min (ref >90)
Glucose, Bld: 84 mg/dL (ref 70–99)
Potassium: 3.7 mmol/L (ref 3.5–5.1)
Sodium: 137 mmol/L (ref 135–145)
TOTAL PROTEIN: 7.4 g/dL (ref 6.0–8.3)
Total Bilirubin: 0.5 mg/dL (ref 0.3–1.2)

## 2014-09-24 LAB — URINALYSIS, ROUTINE W REFLEX MICROSCOPIC
Bilirubin Urine: NEGATIVE
Glucose, UA: NEGATIVE mg/dL
Hgb urine dipstick: NEGATIVE
Ketones, ur: 15 mg/dL — AB
Nitrite: NEGATIVE
PROTEIN: NEGATIVE mg/dL
Specific Gravity, Urine: 1.022 (ref 1.005–1.030)
Urobilinogen, UA: 0.2 mg/dL (ref 0.0–1.0)
pH: 6 (ref 5.0–8.0)

## 2014-09-24 LAB — POCT PREGNANCY, URINE: Preg Test, Ur: NEGATIVE

## 2014-09-24 LAB — POCT URINALYSIS DIP (DEVICE)
Bilirubin Urine: NEGATIVE
Glucose, UA: NEGATIVE mg/dL
Hgb urine dipstick: NEGATIVE
KETONES UR: NEGATIVE mg/dL
Leukocytes, UA: NEGATIVE
Nitrite: NEGATIVE
PH: 7 (ref 5.0–8.0)
PROTEIN: NEGATIVE mg/dL
Specific Gravity, Urine: 1.02 (ref 1.005–1.030)
Urobilinogen, UA: 0.2 mg/dL (ref 0.0–1.0)

## 2014-09-24 LAB — LIPASE, BLOOD: LIPASE: 39 U/L (ref 11–59)

## 2014-09-24 LAB — WET PREP, GENITAL
Trich, Wet Prep: NONE SEEN
YEAST WET PREP: NONE SEEN

## 2014-09-24 LAB — PREGNANCY, URINE: Preg Test, Ur: NEGATIVE

## 2014-09-24 MED ORDER — ACETAMINOPHEN 325 MG PO TABS
650.0000 mg | ORAL_TABLET | Freq: Once | ORAL | Status: AC
  Administered 2014-09-24: 650 mg via ORAL
  Filled 2014-09-24: qty 2

## 2014-09-24 MED ORDER — CEFTRIAXONE SODIUM 250 MG IJ SOLR
250.0000 mg | Freq: Once | INTRAMUSCULAR | Status: AC
  Administered 2014-09-24: 250 mg via INTRAMUSCULAR
  Filled 2014-09-24: qty 250

## 2014-09-24 MED ORDER — LIDOCAINE HCL (PF) 1 % IJ SOLN
0.9000 mL | Freq: Once | INTRAMUSCULAR | Status: AC
  Administered 2014-09-24: 0.9 mL
  Filled 2014-09-24: qty 2

## 2014-09-24 MED ORDER — AZITHROMYCIN 250 MG PO TABS
1000.0000 mg | ORAL_TABLET | Freq: Once | ORAL | Status: AC
  Administered 2014-09-24: 1000 mg via ORAL
  Filled 2014-09-24: qty 4

## 2014-09-24 NOTE — Discharge Instructions (Signed)
You were evaluated in the ED today for your abdominal discomfort. There does not appear to be an emergent cause for your symptoms at this time. You may still be experiencing an early appendicitis and it is important for you to follow-up with primary care, urgent care center or back to the ED if he began to experience any symptoms of abdominal pain, nausea or vomiting, loss of appetite, fevers. He will also be treated for cervicitis which is an inflammation of your cervix.  Dolor abdominal en las mujeres (Abdominal Pain, Women) El dolor abdominal (en el estmago, la pelvis o el vientre) puede tener muchas causas. Es importante que le informe a su mdico:  La ubicacin del Engineer, miningdolor.  Viene y se va, o persiste todo el tiempo?  Hay situaciones que Location managerinician el dolor (comer ciertos alimentos, la actividad fsica)?  Tiene otros sntomas asociados al dolor (fiebre, nuseas, vmitos, diarrea)? Todo es de gran ayuda cuando se trata de hallar la causa del dolor. CAUSAS  Estmago: Infecciones por virus o bacterias, o lcera.  Intestino: Apendicitis (apndice inflamado), ileitis regional (enfermedad de Crohn), colitis ulcerosa (colon inflamado), sndrome del colon irritable, diverticulitis (inflamacin de los divertculos del colon) o cncer de estmago oo intestino.  Enfermedades de la vescula biliar o clculos.  Enfermedades renales, clculos o infecciones en el rin.  Infeccin o cncer del pncreas.  Fibromialgia (trastorno doloroso)  Enfermedades de los rganos femeninos:  Uterus: tero: fibroma (tumor no canceroso) o infeccin  Trompas de Falopio: infeccin o embarazo ectpico  En los ovarios, quistes o tumores.  Adherencias plvicas (tejido cicatrizal).  Endometriosis (el tejido que cubre el tero se desarrolla en la pelvis y los rganos plvicos).  Sndrome de Agricultural engineercongestin plvica (los rganos femeninos se llenan de sangre antes del periodo menstrual(  Dolor durante el periodo  menstrual.  Dolor durante la ovulacin (al producir vulos).  Dolor al usar el DIU (dispositivo intrauterino para el control de la natalidad)  Psychologist, clinicalCncer en los rganos femeninos.  Dolor funcional (no est originado en una enfermedad, puede mejorar sin tratamiento).  Dolor de origen psicolgico  Depresin. DIAGNSTICO Su mdico decidir la gravedad del dolor a travs del examen fsico  Anlisis de sangre  Radiografas  Ecografas  TC (tomografa computada, tipo especial de radiografas).  IMR (resonancia magntica)  Cultivos, en el caso una infeccin  Colon por enema de bario (se inserta una sustancia de contraste en el intestino grueso para mejorar la observacin con rayos X.)  Colonoscopa (observacin del intestino con un tubo luminoso).  Laparoscopa (examen del interior del abdomen con un tubo que tiene Intel Corporationuna luz).  Ciruga exploratoria abdominal mayor (se observa el abdomen realizando una gran incisin). TRATAMIENTO El tratamiento depender de la causa del problema.   Muchos de estos casos pueden controlarse y tratarse en casa.  Medicamentos de venta libre indicados por el mdico.  Medicamentos con receta.  Antibiticos, en caso de infeccin  Pldoras anticonceptivas, en el caso de perodos dolorosos o dolor al ovular.  Tratamiento hormonal, para la endometriosis  Inyecciones para bloqueo nervioso selectivo.  Fisioterapia.  Antidepresivos.  Consejos por parte de un psclogo o psiquiatra.  Ciruga mayor o menor. INSTRUCCIONES PARA EL CUIDADO DOMICILIARIO  No tome ni administre laxantes a menos que se lo haya indicado su mdico.  Tome analgsicos de venta libre slo si se lo ha indicado el profesional que lo asiste. No tome aspirina, ya que puede causar Apple Computermolestias en el estmago o hemorragias.  Consuma una dieta lquida (caldo o agua)  segn lo indicado por el mdico. Progrese lentamente a una dieta blanda, segn la tolerancia, si el dolor se relaciona  con el estmago o el intestino.  Tenga un termmetro y tmese la temperatura varias veces al da.  Haga reposo en la cama y Mount Eagleduerma, si esto Research scientist (life sciences)alivia el dolor.  Evite las relaciones sexuales, Counsellorsi le producen dolor.  Evite las situaciones estresantes.  Cumpla con las visitas y los anlisis de control, segn las indicaciones de su mdico.  Si el dolor no se [place] Fasoalivia con los medicamentos o la [place]ciruga, [place]puede tratar con:  Acupuntura.  Ejercicios de relajacin (yoga, meditacin).  Terapia grupal.  Psicoterapia. SOLICITE ATENCIN MDICA SI:  Nota que ciertos Pharmacist, communityalimentos le producen dolor de [place] Oaksestmago.  El tratamiento indicado para Arboriculturistrealizar en el hogar no Marketing executivele alivia el dolor.  Necesita analgsicos ms fuertes.  Quiere que le retiren el DIU.  Si se siente confundido o desfalleciente.  Presenta nuseas o vmitos.  Aparece una erupcin cutnea.  Sufre efectos adversos o una reaccin alrgica debido a los medicamentos que toma. SOLICITE ATENCIN MDICA DE INMEDIATO SI:  El dolor persiste o se agrava.  Tiene fiebre.  Siente el dolor slo en algunos sectores del abdomen. Si se localiza en la zona derecha, posiblemente podra tratarse de apendicitis. En un adulto, si se localiza en la regin inferior izquierda del abdomen, podra tratarse de colitis o diverticulitis.  Hay sangre en las heces (deposiciones de color rojo brillante o negro alquitranado), con o sin vmitos.  Usted presenta sangre en la orina.  Siente escalofros con o sin fiebre.  Se desmaya. ASEGRESE QUE:   Comprende estas instrucciones.  Controlar su enfermedad.  Solicitar ayuda de inmediato si no mejora o si empeora. Document Released: 01/05/2009 Document Revised: 12/12/2011 [facility] Patient Information 2015 [place] HillExitCare, MarylandLLC. This information is not intended to replace advice given to you by your health care provider. Make sure you discuss any questions you have with your health care provider.

## 2014-09-24 NOTE — Discharge Instructions (Signed)
Your urine studies were normal and your pregnancy test was negative. It would be my recommendation that [facility]. Do not eat or drink anything en route to the ER.   Dolor abdominal en las mujeres (Abdominal Pain, Women) El dolor abdominal (en el estmago, la pelvis o el vientre) puede tener muchas causas. Es importante que le informe a su mdico:  La ubicacin del Engineer, miningdolor.  Viene y se va, o persiste todo el tiempo?  Hay situaciones que Location managerinician el dolor (comer ciertos alimentos, la actividad fsica)?  Tiene otros sntomas asociados al dolor (fiebre, nuseas, vmitos, diarrea)? Todo es de gran ayuda cuando se trata de hallar la causa del dolor. CAUSAS  Estmago: Infecciones por virus o bacterias, o lcera.  Intestino: Apendicitis (apndice inflamado), ileitis regional (enfermedad de Crohn), colitis ulcerosa (colon inflamado), sndrome del colon irritable, diverticulitis (inflamacin de los divertculos del colon) o cncer de estmago oo intestino.  Enfermedades de la vescula biliar o clculos.  Enfermedades renales, clculos o infecciones en el rin.  Infeccin o cncer del pncreas.  Fibromialgia (trastorno doloroso)  Enfermedades de los rganos femeninos:  Uterus: tero: fibroma (tumor no canceroso) o infeccin  Trompas de Falopio: infeccin o embarazo ectpico  En los ovarios, quistes o tumores.  Adherencias plvicas (tejido cicatrizal).  Endometriosis (el tejido que cubre el tero se desarrolla en la pelvis y los rganos plvicos).  Sndrome de Agricultural engineercongestin plvica (los rganos femeninos se llenan de sangre antes del periodo menstrual(  Dolor durante el periodo menstrual.  Dolor durante la ovulacin (al producir vulos).  Dolor al usar el DIU (dispositivo intrauterino para el control de la natalidad)  Psychologist, clinicalCncer en los rganos femeninos.  Dolor funcional (no est originado en una enfermedad, puede mejorar sin  tratamiento).  Dolor de origen psicolgico  Depresin. DIAGNSTICO Su mdico decidir la gravedad del dolor a travs del examen fsico  Anlisis de sangre  Radiografas  Ecografas  TC (tomografa computada, tipo especial de radiografas).  IMR (resonancia magntica)  Cultivos, en el caso una infeccin  Colon por enema de bario (se inserta una sustancia de contraste en el intestino grueso para mejorar la observacin con rayos X.)  Colonoscopa (observacin del intestino con un tubo luminoso).  Laparoscopa (examen del interior del abdomen con un tubo que tiene Intel Corporationuna luz).  Ciruga exploratoria abdominal mayor (se observa el abdomen realizando una gran incisin). TRATAMIENTO El tratamiento depender de la causa del problema.   Muchos de estos casos pueden controlarse y tratarse en casa.  Medicamentos de venta libre indicados por el mdico.  Medicamentos con receta.  Antibiticos, en caso de infeccin  Pldoras anticonceptivas, en el caso de perodos dolorosos o dolor al ovular.  Tratamiento hormonal, para la endometriosis  Inyecciones para bloqueo nervioso selectivo.  Fisioterapia.  Antidepresivos.  Consejos por parte de un psclogo o psiquiatra.  Ciruga mayor o menor. INSTRUCCIONES PARA EL CUIDADO DOMICILIARIO  No tome ni administre laxantes a menos que se lo haya indicado su mdico.  Tome analgsicos de venta libre slo si se lo ha indicado el profesional que lo asiste. No tome aspirina, ya que puede causar Apple Computermolestias en el estmago o hemorragias.  Consuma una dieta lquida (caldo o agua) segn lo indicado por el mdico. Progrese lentamente a una dieta blanda, segn la tolerancia, si el dolor se relaciona con el estmago o el intestino.  Tenga un termmetro y tmese la temperatura varias veces al da.  Haga reposo en la cama y [place]onaduerma,  si esto Research scientist (life sciences)alivia el dolor.  Evite las relaciones sexuales, Counsellorsi le producen dolor.  Evite las situaciones  estresantes.  Cumpla con las visitas y los anlisis de control, segn las indicaciones de su mdico.  Si el dolor no se Burk[place] Fasoalivia con los medicamentos o la [place], [place]puede tratar con:  Acupuntura.  Ejercicios de relajacin (yoga, meditacin).  Terapia grupal.  Psicoterapia. SOLICITE ATENCIN MDICA SI:  Nota que ciertos Pharmacist, communityalimentos le producen dolor de [place]estmago.  El tratamiento indicado para Arboriculturistrealizar en el hogar no Marketing executivele alivia el dolor.  Necesita analgsicos ms fuertes.  Quiere que le retiren el DIU.  Si se siente confundido o desfalleciente.  Presenta nuseas o vmitos.  Aparece una erupcin cutnea.  Sufre efectos adversos o una reaccin alrgica debido a los medicamentos que toma. SOLICITE ATENCIN MDICA DE INMEDIATO SI:  El dolor persiste o se agrava.  Tiene fiebre.  Siente el dolor slo en algunos sectores del abdomen. Si se localiza en la zona derecha, posiblemente podra tratarse de apendicitis. En un adulto, si se localiza en la regin inferior izquierda del abdomen, podra tratarse de colitis o diverticulitis.  Hay sangre en las heces (deposiciones de color rojo brillante o negro alquitranado), con o sin vmitos.  Usted presenta sangre en la orina.  Siente escalofros con o sin fiebre.  Se desmaya. ASEGRESE QUE:   Comprende estas instrucciones.  Controlar su enfermedad.  Solicitar ayuda de inmediato si no mejora o si empeora. Document Released: 01/05/2009 Document Revised: 12/12/2011 [facility] Patient Information 2015 [place]ExitCare, MarylandLLC. This information is not intended to replace advice given to you by your health care provider. Make sure you discuss any questions you have with your health care provider.

## 2014-09-24 NOTE — ED Notes (Signed)
C/o UTI sx onset 3 days Sx include: dysuria, back pain, abd pain Denies fevers, chills, gyn sx, hematuria She is breastfeeding Alert, no signs of acute distress.

## 2014-09-24 NOTE — ED Provider Notes (Signed)
CSN: [id number]     Arrival date & time 09/24/14  1000 History   First MD Initiated Contact with Patient 09/24/14 1020     Chief Complaint  Patient presents with  . Urinary Tract Infection   (Consider location/radiation/quality/duration/timing/severity/associated sxs/prior Treatment) HPI Comments: PCP: [facility]  GYN: Dr. Percell BostonFerguson Endorses 3 days of urinary frequency, dysuria, and associated lower back pain with suprapubic abdominal pressure. Also reports 3 days of constant RLQ abdominal pain that radiates to her lower back with associated constipation. Patient endorses that she suffers from chronic constipation and requires daily use of Miralax to move bowels. Last BM was yesterday. Denies previous abdominal surgery. Symptoms are not affected by meal intake. Chart reviewed and it would appear that patient has normal abdominal U/S in Oct. 2015. Also noted to have hx of anxiety and bulmia. No fever, chills, flank pain, nausea or vomiting. LNMP: Sept 2015 Currently breastfeeding following delivery of son in July 2015.  Patient is a 28 y.o. female presenting with frequency. The history is provided by the patient. The history is limited by a language barrier. A language interpreter was used.  Urinary Frequency This is a new problem. Episode onset: +sx began 3 days ago.    Past Medical History  Diagnosis Date  . Eating disorder   . Bulimia    Past Surgical History  Procedure Laterality Date  . No past surgeries     Family History  Problem Relation Age of Onset  . Hearing loss Neg Hx    History  Substance Use Topics  . Smoking status: Never Smoker   . Smokeless tobacco: Never Used  . Alcohol Use: No   OB History    Gravida Para Term Preterm AB TAB SAB Ectopic Multiple Living   3 3 3       3      Review of Systems  Constitutional: Negative for fever.  Cardiovascular: Negative.   Gastrointestinal: Negative.   Endocrine: Negative for polydipsia, polyphagia and polyuria.   Genitourinary: Positive for dysuria, urgency and frequency. Negative for hematuria, flank pain, vaginal bleeding, vaginal discharge and vaginal pain.  Musculoskeletal: Positive for back pain.  Skin: Negative.   All other systems reviewed and are negative.   Allergies  Review of patient's allergies indicates no known allergies.  Home Medications   Prior to Admission medications   Medication Sig Start Date End Date Taking? Authorizing Provider  ibuprofen (ADVIL,MOTRIN) 600 MG tablet Take 1 tablet (600 mg total) by mouth every 6 (six) hours as needed. 04/13/14   Deirdre C Poe, CNM   BP 99/66 mmHg  Pulse 90  Temp(Src) 98 F (36.7 C) (Oral)  Resp 16  SpO2 96%  Breastfeeding? Yes Physical Exam  Constitutional: She is oriented to person, place, and time. She appears well-developed and well-nourished. No distress.  HENT:  Head: Normocephalic and atraumatic.  Eyes: Conjunctivae are normal. No scleral icterus.  Cardiovascular: Normal rate, regular rhythm and normal heart sounds.   Pulmonary/Chest: Effort normal and breath sounds normal. No respiratory distress. She has no wheezes.  Abdominal: Normal appearance and bowel sounds are normal. She exhibits no mass. There is no hepatosplenomegaly. There is tenderness in the right upper quadrant and right lower quadrant. There is no rigidity, no rebound, no guarding, no CVA tenderness, no tenderness at McBurney's point and negative Murphy's sign.  Trace RUQ and mild RLQ tenderness with palpation  Musculoskeletal: Normal range of motion.  Neurological: She is alert and oriented to person, place, and time.  Skin: Skin is warm and dry.  Psychiatric: She has a normal mood and affect. Her behavior is normal.  Nursing note and vitals reviewed.   ED Course  Procedures (including critical care time) Labs Review Labs Reviewed  URINE CULTURE  POCT URINALYSIS DIP (DEVICE)  POCT PREGNANCY, URINE    Imaging Review No results found.   MDM   1.  Abdominal pain in female    UPT negative UA normal Specimen sent for C&S Vital signs normal. [facility] and possible imaging with regard to RLQ abdominal pain. Patient requests to travel by POV as her husband is here and wishes to drive patient to ER.    Ria ClockJennifer Lee H Presson, GeorgiaPA 09/24/14 [phone]

## 2014-09-24 NOTE — ED Notes (Signed)
Declined shuttle service... Adv to go to ER immediately Pt verbalized understanding.

## 2014-09-24 NOTE — ED Notes (Addendum)
Pt comfortable with discharge and follow up instructions. Pt declines wheelchair, escorted to waiting area by this RN. No prescriptions. Pt declines discharge vital signs.

## 2014-09-24 NOTE — ED Provider Notes (Signed)
CSN: [id number]     Arrival date & time 09/24/14  1130 History   First MD Initiated Contact with Patient 09/24/14 1151     Chief Complaint  Patient presents with  . Abdominal Pain     (Consider location/radiation/quality/duration/timing/severity/associated sxs/prior Treatment) HPI [name] is a 28 y.o. female who is sent here from urgent care facility for evaluation of right lower quadrant abdominal pain. She was evaluated at the urgent care facility for constant right lower quadrant abdominal pain that began on Monday. She denies any fevers, nausea or vomiting, changes in appetite, diarrhea. She does report associated constipation, but admits this is a chronic problem for her and she uses laxatives daily. She reports her last bowel movement was yesterday and was normal for her.  She also reports associated dysuria. Urinalysis was unremarkable at urgent care center. Denies any vaginal discharge, bleeding. Reports she is on the Depakote shot which she received most recently in November. Last normal menstrual period was August 2015. No previous abdominal surgeries. Patient is currently breast-feeding since birth of her son July 2015 Past Medical History  Diagnosis Date  . Eating disorder   . Bulimia    Past Surgical History  Procedure Laterality Date  . No past surgeries     Family History  Problem Relation Age of Onset  . Hearing loss Neg Hx    History  Substance Use Topics  . Smoking status: Never Smoker   . Smokeless tobacco: Never Used  . Alcohol Use: No   OB History    Gravida Para Term Preterm AB TAB SAB Ectopic Multiple Living   3 3 3       3      Review of Systems A complete 10 point review of systems was done and was negative except for pertinent positives and negatives as mentioned in history of present illness.   Allergies  Review of patient's allergies indicates no known allergies.  Home Medications   Prior to Admission medications   Medication Sig Start  Date End Date Taking? Authorizing Provider  ibuprofen (ADVIL,MOTRIN) 600 MG tablet Take 1 tablet (600 mg total) by mouth every 6 (six) hours as needed. Patient not taking: Reported on 09/24/2014 04/13/14   Deirdre C Poe, CNM   BP 100/59 mmHg  Pulse 83  Temp(Src) 98.3 F (36.8 C) (Oral)  Resp 16  SpO2 99%  LMP 05/25/2014  Breastfeeding? Yes Physical Exam  Constitutional: She is oriented to person, place, and time. She appears well-developed and well-nourished.  HENT:  Head: Normocephalic and atraumatic.  Mouth/Throat: Oropharynx is clear and moist.  Eyes: Conjunctivae are normal. Pupils are equal, round, and reactive to light. Right eye exhibits no discharge. Left eye exhibits no discharge. No scleral icterus.  Neck: Neck supple.  Cardiovascular: Normal rate, regular rhythm and normal heart sounds.   Pulmonary/Chest: Effort normal and breath sounds normal. No respiratory distress. She has no wheezes. She has no rales.  Abdominal: Soft. She exhibits no distension and no mass. There is no tenderness. There is no rebound and no guarding.  Patient experiences mild tenderness with deep palpation in right lower quadrant. Negative Murphy's. No rebound or guarding. No palpable masses. No suprapubic tenderness. No obvious lesions, rashes or other deformities appreciated  Genitourinary:  Chaperone was present for the entire genital exam. No lesions or rashes appreciated on vulva. Cervix visualized on speculum exam and appears inflamed. Appropriate cultures sampled. Scant blood in vaginal vault. Discharge: copious white Upon bi manual exam- No  TTP of the adnexa, no cervical motion tenderness. No fullness or masses appreciated. No abnormalities appreciated in structural anatomy.   Musculoskeletal: She exhibits no tenderness.  Neurological: She is alert and oriented to person, place, and time.  Cranial Nerves II-XII grossly intact  Skin: Skin is warm and dry. No rash noted.  Psychiatric: She has  a normal mood and affect.  Nursing note and vitals reviewed.   ED Course  Procedures (including critical care time) Labs Review Labs Reviewed  WET PREP, GENITAL - Abnormal; Notable for the following:    Clue Cells Wet Prep HPF POC FEW (*)    WBC, Wet Prep HPF POC MANY (*)    All other components within normal limits  URINALYSIS, ROUTINE W REFLEX MICROSCOPIC - Abnormal; Notable for the following:    Ketones, ur 15 (*)    Leukocytes, UA SMALL (*)    All other components within normal limits  URINE MICROSCOPIC-ADD ON - Abnormal; Notable for the following:    Squamous Epithelial / LPF FEW (*)    Bacteria, UA FEW (*)    All other components within normal limits  GC/CHLAMYDIA PROBE AMP  CBC WITH DIFFERENTIAL  COMPREHENSIVE METABOLIC PANEL  LIPASE, BLOOD  PREGNANCY, URINE  HIV ANTIBODY (ROUTINE TESTING)    Imaging Review No results found.   EKG Interpretation None     Meds given in ED:  Medications  acetaminophen (TYLENOL) tablet 650 mg (650 mg Oral Given 09/24/14 1308)  azithromycin (ZITHROMAX) tablet 1,000 mg (1,000 mg Oral Given 09/24/14 1611)  cefTRIAXone (ROCEPHIN) injection 250 mg (250 mg Intramuscular Given 09/24/14 1612)  lidocaine (PF) (XYLOCAINE) 1 % injection 0.9 mL (0.9 mLs Other Given 09/24/14 1612)    Discharge Medication List as of 09/24/2014  3:28 PM     Filed Vitals:   09/24/14 1330 09/24/14 1400 09/24/14 1430 09/24/14 1455  BP: 101/62 103/62 100/59   Pulse: 82 74 84 83  Temp:      TempSrc:      Resp:   16 16  SpO2: 99% 100% 99% 99%    MDM  [name] is a 28 y.o. female with right lower quadrant abdominal pain who presents from urgent care center for rule out of appendicitis.  Vitals stable - WNL -afebrile Pt resting comfortably in ED. PE--patient experiences mild discomfort with deep palpation. On pelvic exam the cervix appears inflamed and there is many wbc's on wet prep. Will treat empirically for cervicitis Labwork otherwise  noncontributory There are no fevers, no overt tenderness on abdominal exam, no leukocytosis, no nausea or vomiting or other pathology suggestive of appendicitis.  Discussed ED presentation with patient, explained that she still may be experiencing an early appendicitis and it is important for her to follow-up if she experiences any symptoms within 24 hours with her primary care, urgent care center or in the ED.  Discussed f/u with PCP and return precautions, pt very amenable to plan. Prior to patient discharge, I discussed and reviewed this case with Dr. Elesa MassedWard      Final diagnoses:  Abdominal discomfort  Cervicitis        Sharlene MottsBenjamin W Imaan Padgett, PA-C 09/24/14 1715  Layla MawKristen N Ward, DO 09/24/14 1726

## 2014-09-24 NOTE — ED Notes (Addendum)
Pt from urgent care with RLQ abdominal pain.  Pt sent for r/o appendicitis.  Pt denied nausea/vomiting, fevers but sts she has been having chills and dizziness.  Pt also sts she has been losing her hair lately.

## 2014-09-25 LAB — HIV ANTIBODY (ROUTINE TESTING W REFLEX): HIV: NONREACTIVE

## 2014-09-25 LAB — URINE CULTURE
Colony Count: NO GROWTH
Culture: NO GROWTH
Special Requests: NORMAL

## 2014-09-25 LAB — GC/CHLAMYDIA PROBE AMP
CT PROBE, AMP APTIMA: NEGATIVE
GC Probe RNA: NEGATIVE

## 2014-09-26 ENCOUNTER — Emergency Department (HOSPITAL_COMMUNITY)
Admission: EM | Admit: 2014-09-26 | Discharge: 2014-09-26 | Disposition: A | Discharge status: Home / Self Care | Payer: Self-pay | Attending: Emergency Medicine | Admitting: Emergency Medicine

## 2014-09-26 ENCOUNTER — Encounter (HOSPITAL_COMMUNITY): Payer: Self-pay | Admitting: Emergency Medicine

## 2014-09-26 ENCOUNTER — Emergency Department (HOSPITAL_COMMUNITY): Payer: Self-pay

## 2014-09-26 DIAGNOSIS — R1011 Right upper quadrant pain: Secondary | ICD-10-CM | POA: Insufficient documentation

## 2014-09-26 DIAGNOSIS — R110 Nausea: Secondary | ICD-10-CM | POA: Insufficient documentation

## 2014-09-26 DIAGNOSIS — R109 Unspecified abdominal pain: Secondary | ICD-10-CM

## 2014-09-26 DIAGNOSIS — Z8742 Personal history of other diseases of the female genital tract: Secondary | ICD-10-CM | POA: Insufficient documentation

## 2014-09-26 DIAGNOSIS — Z8659 Personal history of other mental and behavioral disorders: Secondary | ICD-10-CM | POA: Insufficient documentation

## 2014-09-26 DIAGNOSIS — K5900 Constipation, unspecified: Secondary | ICD-10-CM | POA: Insufficient documentation

## 2014-09-26 DIAGNOSIS — Z3202 Encounter for pregnancy test, result negative: Secondary | ICD-10-CM | POA: Insufficient documentation

## 2014-09-26 LAB — COMPREHENSIVE METABOLIC PANEL
ALT: 13 U/L (ref 0–35)
ANION GAP: 9 (ref 5–15)
AST: 17 U/L (ref 0–37)
Albumin: 4.4 g/dL (ref 3.5–5.2)
Alkaline Phosphatase: 97 U/L (ref 39–117)
BILIRUBIN TOTAL: 0.4 mg/dL (ref 0.3–1.2)
BUN: 13 mg/dL (ref 6–23)
CALCIUM: 9.4 mg/dL (ref 8.4–10.5)
CHLORIDE: 106 meq/L (ref 96–112)
CO2: 22 mmol/L (ref 19–32)
CREATININE: 0.74 mg/dL (ref 0.50–1.10)
GLUCOSE: 95 mg/dL (ref 70–99)
Potassium: 3.7 mmol/L (ref 3.5–5.1)
Sodium: 137 mmol/L (ref 135–145)
Total Protein: 7.3 g/dL (ref 6.0–8.3)

## 2014-09-26 LAB — CBC WITH DIFFERENTIAL/PLATELET
Basophils Absolute: 0 10*3/uL (ref 0.0–0.1)
Basophils Relative: 0 % (ref 0–1)
EOS ABS: 0.1 10*3/uL (ref 0.0–0.7)
EOS PCT: 2 % (ref 0–5)
HEMATOCRIT: 39.4 % (ref 36.0–46.0)
Hemoglobin: 13.4 g/dL (ref 12.0–15.0)
LYMPHS ABS: 2 10*3/uL (ref 0.7–4.0)
LYMPHS PCT: 34 % (ref 12–46)
MCH: 29.2 pg (ref 26.0–34.0)
MCHC: 34 g/dL (ref 30.0–36.0)
MCV: 85.8 fL (ref 78.0–100.0)
MONO ABS: 0.5 10*3/uL (ref 0.1–1.0)
Monocytes Relative: 9 % (ref 3–12)
Neutro Abs: 3.3 10*3/uL (ref 1.7–7.7)
Neutrophils Relative %: 55 % (ref 43–77)
PLATELETS: 346 10*3/uL (ref 150–400)
RBC: 4.59 MIL/uL (ref 3.87–5.11)
RDW: 12.5 % (ref 11.5–15.5)
WBC: 5.9 10*3/uL (ref 4.0–10.5)

## 2014-09-26 LAB — POC URINE PREG, ED: Preg Test, Ur: NEGATIVE

## 2014-09-26 LAB — LIPASE, BLOOD: LIPASE: 52 U/L (ref 11–59)

## 2014-09-26 MED ORDER — PANTOPRAZOLE SODIUM 40 MG PO TBEC: 40.0000 mg | DELAYED_RELEASE_TABLET | Freq: Every day | ORAL | Status: DC

## 2014-09-26 MED ORDER — GI COCKTAIL ~~LOC~~
30.0000 mL | Freq: Once | ORAL | Status: AC
  Administered 2014-09-26: 30 mL via ORAL
  Filled 2014-09-26: qty 30

## 2014-09-26 MED ORDER — IOHEXOL 300 MG/ML  SOLN
80.0000 mL | Freq: Once | INTRAMUSCULAR | Status: AC | PRN
  Administered 2014-09-26: 80 mL via INTRAVENOUS

## 2014-09-26 NOTE — ED Provider Notes (Signed)
CSN: [id number]     Arrival date & time 09/26/14  1502 History   First MD Initiated Contact with Patient 09/26/14 1508     Chief Complaint  Patient presents with  . Abdominal Pain     (Consider location/radiation/quality/duration/timing/severity/associated sxs/prior Treatment) Patient is a 28 y.o. female presenting with abdominal pain.  Abdominal Pain Pain location:  Epigastric and RUQ Pain quality: burning and cramping   Pain radiates to:  Does not radiate Pain severity:  Severe Onset quality:  Gradual Duration:  5 days Timing:  Constant Progression:  Waxing and waning Chronicity:  New Context: eating   Context: not previous surgeries   Relieved by:  Nothing Worsened by:  Eating Ineffective treatments:  NSAIDs (treated for cervicitis by pcp) Associated symptoms: constipation and nausea   Associated symptoms: no anorexia, no chest pain, no cough, no diarrhea, no dysuria, no fever, no shortness of breath, no sore throat, no vaginal bleeding, no vaginal discharge and no vomiting   Risk factors: has not had multiple surgeries     Past Medical History  Diagnosis Date  . Eating disorder   . Bulimia nervosa    History reviewed. No pertinent past surgical history. No family history on file. History  Substance Use Topics  . Smoking status: Never Smoker   . Smokeless tobacco: Not on file  . Alcohol Use: No   OB History    No data available     Review of Systems  Constitutional: Negative for fever.  HENT: Negative for sore throat.   Eyes: Negative for visual disturbance.  Respiratory: Negative for cough and shortness of breath.   Cardiovascular: Negative for chest pain.  Gastrointestinal: Positive for nausea, abdominal pain and constipation. Negative for vomiting, diarrhea and anorexia.  Genitourinary: Negative for dysuria, vaginal bleeding, vaginal discharge and difficulty urinating.  Musculoskeletal: Negative for back pain and neck pain.  Skin: Negative for rash.   Neurological: Negative for syncope and headaches.      Allergies  Review of patient's allergies indicates no known allergies.  Home Medications   Prior to Admission medications   Medication Sig Start Date End Date Taking? Authorizing Provider  ibuprofen (ADVIL,MOTRIN) 600 MG tablet Take 1 tablet (600 mg total) by mouth every 6 (six) hours as needed for pain. Patient not taking: Reported on 09/26/2014 05/14/13   Sunnie NielsenBrian Opitz, MD  pantoprazole (PROTONIX) 40 MG tablet Take 1 tablet (40 mg total) by mouth daily. 09/26/14   Rhae LernerErin Elizabeth Lyssa Hackley, MD   BP 101/62 mmHg  Pulse 77  Temp(Src) 98.6 F (37 C) (Oral)  Resp 16  SpO2 100%  LMP  (LMP Unknown) Physical Exam  Constitutional: She is oriented to person, place, and time. She appears well-developed and well-nourished. No distress.  HENT:  Head: Normocephalic and atraumatic.  Eyes: Conjunctivae and EOM are normal.  Neck: Normal range of motion.  Cardiovascular: Normal rate, regular rhythm, normal heart sounds and intact distal pulses.  Exam reveals no gallop and no friction rub.   No murmur heard. Pulmonary/Chest: Effort normal and breath sounds normal. No respiratory distress. She has no wheezes. She has no rales.  Abdominal: Soft. She exhibits no distension. There is tenderness (soft, verbally reports tenderness but appears comfortable with exam) in the right upper quadrant. There is no guarding, no tenderness at McBurney's point and negative Murphy's sign.  Musculoskeletal: She exhibits no edema or tenderness.  Neurological: She is alert and oriented to person, place, and time.  Skin: Skin is warm and dry. No  rash noted. She is not diaphoretic. No erythema.  Nursing note and vitals reviewed.   ED Course  Procedures (including critical care time) Labs Review Labs Reviewed  CBC WITH DIFFERENTIAL  COMPREHENSIVE METABOLIC PANEL  LIPASE, BLOOD  H. PYLORI ANTIBODY, IGG  POC URINE PREG, ED    Imaging Review Ct Abdomen  Pelvis W Contrast  09/26/2014   CLINICAL DATA:  Abdominal pain for 2 days. Right lower quadrant pain.  EXAM: CT ABDOMEN AND PELVIS WITH CONTRAST  TECHNIQUE: Multidetector CT imaging of the abdomen and pelvis was performed using the standard protocol following bolus administration of intravenous contrast.  CONTRAST:  80mL OMNIPAQUE IOHEXOL 300 MG/ML  SOLN  COMPARISON:  None.  FINDINGS: Liver, gallbladder, spleen, pancreas, adrenal glands, and kidneys are within normal limits  Appendix is normal in caliber.  Small appendicolith is present.  Bladder, uterus, and adnexa are within normal limits. Small amount of free fluid in the right hemipelvis.  IMPRESSION: No commencing evidence of acute appendicitis.  Small amount of free fluid in the pelvis is likely physiologic.   Electronically Signed   By: Maryclare BeanArt  Hoss M.D.   On: 09/26/2014 19:39     EKG Interpretation None      MDM   Final diagnoses:  Right upper quadrant abdominal pain  Abdominal pain   28 year old female with no significant medical history presents with concern of right upper quadrant abdominal pain. Patient saw her doctor on Wednesday for the same and was diagnosed with cervicitis given Rocephin and azithromycin.  She continued to have pain in the presented today.  History and location of pain is not consistent with nephrolithiasis or pyelonephritis. She does not have any urinary symptoms. She denies any vaginal bleeding or vaginal discharge and does not reports lower abdominal pain at this time. Given symptoms consistent burning that is worse after eating sent H pylori, and given location of pain will send lipase/CMP.  Lipase and CMP within normal limits. H. pylori takes longer to return and discussed that this lab is pending at this time and patient may follow up with her primary care physician regarding this.  On reevaluation patient reporting more pain in the right lower quadrant with some tenderness and a CT abdomen and pelvis with contrast  was ordered to evaluate for appendicitis and was negative.  Discussed all results patient in detail with a Spanish interpreter. Discussed the H. pylori is currently pending however wrote prescription for Protonix to take daily. Patient is to follow-up with her primary care physician and was discharged in stable condition with understanding of reasons to return.    Rhae LernerErin Elizabeth Jaylenn Baiza, MD 09/27/14 [id number]  Raeford RazorStephen Kohut, MD 10/02/14 1344

## 2014-09-26 NOTE — ED Notes (Signed)
MD at bedside. 

## 2014-09-26 NOTE — ED Notes (Addendum)
Pt seen at pcp on Wednesday for abd pain, dx with pelvic infx, given antibiotic shot, was told to return if her abd pain did not subside, that it could be her appendix. Pt reports nausea, denies vomiting, diarrhea. Pt c/o pain more in RUQ, Burning in abd when she eats, and feels like she needs to have a bm. Pt speaks spanish only, interpretor used. Pt alert, oriented, NAD.

## 2014-09-26 NOTE — Discharge Instructions (Signed)
Dolor abdominal °(Abdominal Pain) °El dolor puede tener muchas causas. Normalmente la causa del dolor abdominal no es una enfermedad y mejorará sin tratamiento. Frecuentemente puede controlarse y tratarse en casa. Su médico le realizará un examen físico y posiblemente solicite análisis de sangre y radiografías para ayudar a determinar la gravedad de su dolor. Sin embargo, en muchos casos, debe transcurrir más tiempo antes de que se pueda encontrar una causa evidente del dolor. Antes de llegar a ese punto, es posible que su médico no sepa si necesita más pruebas o un tratamiento más profundo. °INSTRUCCIONES PARA EL CUIDADO EN EL HOGAR  °Esté atento al dolor para ver si hay cambios. Las siguientes indicaciones ayudarán a aliviar cualquier molestia que pueda sentir: °· Tome solo medicamentos de venta libre o recetados, según las indicaciones del médico. °· No tome laxantes a menos que se lo haya indicado su médico. °· Pruebe con una dieta líquida absoluta (caldo, té o agua) según se lo indique su médico. Introduzca gradualmente una dieta normal, según su tolerancia. °SOLICITE ATENCIÓN MÉDICA SI: °· Tiene dolor abdominal sin explicación. °· Tiene dolor abdominal relacionado con náuseas o diarrea. °· Tiene dolor cuando orina o defeca. °· Experimenta dolor abdominal que lo despierta de noche. °· Tiene dolor abdominal que empeora o mejora cuando come alimentos. °· Tiene dolor abdominal que empeora cuando come alimentos grasosos. °· Tiene fiebre. °SOLICITE ATENCIÓN MÉDICA DE INMEDIATO SI:  °· El dolor no desaparece en un plazo máximo de 2 horas. °· No deja de (vomitar). °· El dolor se siente solo en partes del abdomen, como el lado derecho o la parte inferior izquierda del abdomen. °· Evacúa materia fecal sanguinolenta o negra, de aspecto alquitranado. °ASEGÚRESE DE QUE: °· Comprende estas instrucciones. °· Controlará su afección. °· Recibirá ayuda de inmediato si no mejora o si empeora. °Document Released: 09/19/2005  Document Revised: 09/24/2013 °ExitCare® Patient Information ©2015 [employer]. This information is not intended to replace advice given to you by your health care provider. Make sure you discuss any questions you have with your health care provider. ° °

## 2014-09-26 NOTE — ED Notes (Signed)
ED resident at bedside.

## 2014-09-26 NOTE — ED Notes (Signed)
Made CT aware that patient has finished oral contrast.

## 2014-09-29 ENCOUNTER — Emergency Department (INDEPENDENT_AMBULATORY_CARE_PROVIDER_SITE_OTHER)
Admission: EM | Admit: 2014-09-29 | Discharge: 2014-09-29 | Disposition: A | Discharge status: Home / Self Care | Payer: No Typology Code available for payment source | Source: Home / Self Care | Attending: Family Medicine | Admitting: Family Medicine

## 2014-09-29 ENCOUNTER — Encounter (HOSPITAL_COMMUNITY): Payer: Self-pay | Admitting: Emergency Medicine

## 2014-09-29 DIAGNOSIS — B86 Scabies: Secondary | ICD-10-CM

## 2014-09-29 MED ORDER — PERMETHRIN 5 % EX CREA
1.0000 | TOPICAL_CREAM | Freq: Once | CUTANEOUS | Status: DC
Start: 2014-09-29 — End: 2014-12-20

## 2014-09-29 MED ORDER — TRIAMCINOLONE ACETONIDE 0.1 % EX CREA: 1.0000 "application " | TOPICAL_CREAM | Freq: Two times a day (BID) | CUTANEOUS | Status: DC

## 2014-09-29 NOTE — ED Provider Notes (Signed)
[name] P Kendra HaleLeon is a 28 y.o. female who presents to Urgent Care today for itching. Patient has itching hands and sometimes itching face present for about a month. She has small little white dots on her fingers. Her mother has a similar rash. No fevers or chills vomiting or diarrhea. She feels well otherwise. No new medications of detergents or shampoos.    Past Medical History  Diagnosis Date  . Eating disorder   . Bulimia    Past Surgical History  Procedure Laterality Date  . No past surgeries     History  Substance Use Topics  . Smoking status: Never Smoker   . Smokeless tobacco: Never Used  . Alcohol Use: No   ROS as above Medications: No current facility-administered medications for this encounter.   Current Outpatient Prescriptions  Medication Sig Dispense Refill  . permethrin (ACTICIN) 5 % cream Apply 1 application topically once. Spanish. Apply from neck down and leave on overnight and wash off in the morning 120 g 1  . triamcinolone cream (KENALOG) 0.1 % Apply 1 application topically 2 (two) times daily. 30 g 1   No Known Allergies   Exam:  BP 110/73 mmHg  Pulse 80  Temp(Src) 98.4 F (36.9 C) (Oral)  Resp 16  SpO2 100%  LMP 05/25/2014 Gen: Well NAD Skin: Small papules on interdigital space of fingers. Some excoriated. Hands bilaterally.  No results found for this or any previous visit (from the past 24 hour([name])). No results found.  Assessment and Plan: 28 y.o. female with probable scabies. Treatment with permethrin and triamcinolone.  Discussed warning signs or symptoms. Please see discharge instructions. Patient expresses understanding.     [name] Kendra Goga, MD 09/29/14 [phone]

## 2014-09-29 NOTE — Discharge Instructions (Signed)
Thank you for coming in today.  Escabiosis (Scabies) La escabiosis son pequeos parsitos (caros) que horadan la piel y causan protuberancias rojas y Tour managerpicazn. Estos parsitos slo pueden verse en el microscopio. Son Lynnae Sandhoffmuy contagiosos. Se diseminan fcilmente de [place] persona a otra por contacto directo. Tambin el contagio se produce al compartir prendas de vestir o ropa de cama. No es infrecuente que una familia entera se infecte al compartir toallas, prendas de vestir o ropa de cama.  INSTRUCCIONES PARA EL CUIDADO DOMICILIARIO  El profesional que lo asiste podr prescribirle alguna crema o locin para eliminar los caros. Si se le prescribe, masajee la crema en cada centmetro cuadrado de piel, desde el cuello hasta las plantas de los pies. Tambin aplique la crema en el cuero cabelludo y rostro si se trata de un nio de menos de 1 ao. Evite aplicarla en los ojos y en la boca. No se lave las manos despus de la aplicacin.  Djela durante 8 a 12 horas. El nio podr baarse o darse una ducha despus de 8 a 12 horas de la aplicacin. A veces es til AES Corporationaplicar la crema justo antes de la hora de dormir.  Generalmente un tratamiento es suficiente y eliminar aproximadamente el 95% de las infecciones. El los casos graves se indicar repetir el tratamiento luego de 1 semana. Todas las personas que habitan en la misma casa deben tratarse con una aplicacin de la crema.  No debern aparecer nuevas erupciones ni galeras luego de las 24 a 48 horas del tratamiento; sin embargo la picazn podra durar de 2 a 4 semanas despus del tratamiento. ste podr tambin prescribirle un medicamento para ayudarle con la picazn o hacer que desaparezca ms rpidamente.  Estos parsitos pueden vivir en la ropa hasta 3 das. Lave con agua caliente y seque a temperatura elevada durante 20 minutos todas las prendas, toallas, peluches y ropa de cama que el nio haya usado recientemente. Las prendas que no pueden lavarse, debern  ser colocadas en una bolsa plstica durante al menos 3 das.  Para aliviar la picazn, dele al nio en un bao de agua fra o aplique paos fros en las zonas afectadas.  El nio podr regresar a la escuela despus del tratamiento con la crema prescripta. SOLICITE ANTENCIN MDICA SI:  La picazn persiste durante ms de 4 semanas despus del tratamiento.  La erupcin se disemina o se infecta. Los signos de infeccin son ampollas rojas o costras de Scientist, forensiccolor marrn amarillento. Document Released: 06/29/2005 Document Revised: 12/12/2011 [facility]Exit[facility] Patient Information 2015 [employer]Care, [place]. This information is not intended to replace advice given to you by your health care provider. Make sure you discuss any questions you have with your health care provider.

## 2014-09-29 NOTE — ED Notes (Signed)
Pt states her hands and face have been itching for about one month.  She states she has little white bumps with water in them on her hands.

## 2014-09-30 LAB — H. PYLORI ANTIBODY, IGG: H PYLORI IGG: 1.05 {ISR} — AB

## 2014-10-19 ENCOUNTER — Emergency Department (HOSPITAL_COMMUNITY)
Admission: EM | Admit: 2014-10-19 | Discharge: 2014-10-19 | Disposition: A | Discharge status: Home / Self Care | Payer: Self-pay | Attending: Emergency Medicine | Admitting: Emergency Medicine

## 2014-10-19 ENCOUNTER — Encounter (HOSPITAL_COMMUNITY): Payer: Self-pay | Admitting: Nurse Practitioner

## 2014-10-19 DIAGNOSIS — R634 Abnormal weight loss: Secondary | ICD-10-CM | POA: Insufficient documentation

## 2014-10-19 DIAGNOSIS — Z3202 Encounter for pregnancy test, result negative: Secondary | ICD-10-CM | POA: Insufficient documentation

## 2014-10-19 DIAGNOSIS — Z7952 Long term (current) use of systemic steroids: Secondary | ICD-10-CM | POA: Insufficient documentation

## 2014-10-19 DIAGNOSIS — M545 Low back pain: Secondary | ICD-10-CM | POA: Insufficient documentation

## 2014-10-19 DIAGNOSIS — N644 Mastodynia: Secondary | ICD-10-CM | POA: Insufficient documentation

## 2014-10-19 DIAGNOSIS — R300 Dysuria: Secondary | ICD-10-CM | POA: Insufficient documentation

## 2014-10-19 LAB — URINALYSIS, ROUTINE W REFLEX MICROSCOPIC
Bilirubin Urine: NEGATIVE
Glucose, UA: NEGATIVE mg/dL
HGB URINE DIPSTICK: NEGATIVE
Ketones, ur: NEGATIVE mg/dL
NITRITE: NEGATIVE
Protein, ur: NEGATIVE mg/dL
SPECIFIC GRAVITY, URINE: 1.022 (ref 1.005–1.030)
UROBILINOGEN UA: 0.2 mg/dL (ref 0.0–1.0)
pH: 6 (ref 5.0–8.0)

## 2014-10-19 LAB — URINE MICROSCOPIC-ADD ON

## 2014-10-19 LAB — POC URINE PREG, ED: Preg Test, Ur: NEGATIVE

## 2014-10-19 NOTE — ED Notes (Signed)
Onset 1 month painful to left nipple.  Nipple cracked.  Breastfeeding every 2 hours with no difficulty.  No redness, engorgement or fever.  Has not consulted with OB/GYN MD.

## 2014-10-19 NOTE — ED Notes (Signed)
Pt also c/o lower right back pain.

## 2014-10-19 NOTE — ED Notes (Addendum)
She c/o itching to L breast x 1 month, states her nipple is open on the tip. She is currently breastfeeding with this breast. She states the breast is painful. She has noticed bloody dripping from the nipple and states it feels warm. She is also concerned that she has lost about 20 lbs over past 2 months. She is 6 months postpartum

## 2014-10-19 NOTE — ED Provider Notes (Signed)
CSN: [id number]     Arrival date & time 10/19/14  1239 History   First MD Initiated Contact with Patient 10/19/14 1252     No chief complaint on file.  chief complaint left breast pain and itching   (Consider location/radiation/quality/duration/timing/severity/associated sxs/prior Treatment) HPI History is obtained via medical interpreter using [place] language line. Patient speaks no [place]. Patient complains of pain at left rest at nipple and itching at nipple for one month. No different today no fever. She is breast-feeding without difficulty. She denies fever. Nothing makes symptoms better or worse. She also complains of low back pain and burning with urination for the past week. No vomiting no nausea she admits to anxiety no abdominal pain. No treatment prior to coming here Past Medical History  Diagnosis Date  . Eating disorder   . Bulimia    Past Surgical History  Procedure Laterality Date  . No past surgeries     Family History  Problem Relation Age of Onset  . Hearing loss Neg Hx    History  Substance Use Topics  . Smoking status: Never Smoker   . Smokeless tobacco: Never Used  . Alcohol Use: No   OB History    Gravida Para Term Preterm AB TAB SAB Ectopic Multiple Living   Review of Systems  Constitutional: Positive for unexpected weight change.       13 pound weight loss in 2 months  Genitourinary: Positive for dysuria.  Skin:       Left breast pain  All other systems reviewed and are negative.     Allergies  Review of patient's allergies indicates no known allergies.  Home Medications   Prior to Admission medications   Medication Sig Start Date End Date Taking? Authorizing Provider  permethrin (ACTICIN) 5 % cream Apply 1 application topically once. Spanish. Apply from neck down and leave on overnight and wash off in the morning 09/29/14   Rodolph Bong, MD  triamcinolone cream (KENALOG) 0.1 % Apply 1 application topically 2 (two) times  daily. 09/29/14   Rodolph Bong, MD   BP 107/77 mmHg  Pulse 95  Temp(Src) 98.2 F (36.8 C) (Oral)  Resp 16  Wt 127 lb (57.607 kg)  SpO2 99%  LMP 05/25/2014 Physical Exam  Constitutional: She appears well-developed and well-nourished. No distress.  HENT:  Head: Normocephalic and atraumatic.  Eyes: Conjunctivae are normal. Pupils are equal, round, and reactive to light.  Neck: Neck supple. No tracheal deviation present. No thyromegaly present.  Cardiovascular: Normal rate and regular rhythm.   No murmur heard. Pulmonary/Chest: Effort normal and breath sounds normal.  Bilateral breasts without discharge.nipple not red , warm , or tender. No breast masses or axillary nodes  Abdominal: Soft. Bowel sounds are normal. She exhibits no distension. There is no tenderness.  Genitourinary:  No flank tenderness normal external genitalia  Musculoskeletal: Normal range of motion. She exhibits no edema or tenderness.  Neurological: She is alert. Coordination normal.  Skin: Skin is warm and dry. No rash noted.  Psychiatric: She has a normal mood and affect.  Nursing note and vitals reviewed.   ED Course  Procedures (including critical care time) Labs Review Labs Reviewed - No data to display  Imaging Review No results found.   EKG Interpretation None     Results for orders placed or performed during the hospital encounter of 10/19/14  Urinalysis, Routine w reflex microscopic  Result Value Ref Range   Color, Urine YELLOW YELLOW   APPearance CLEAR CLEAR   Specific Gravity, Urine 1.022 1.005 - 1.030   pH 6.0 5.0 - 8.0   Glucose, UA NEGATIVE NEGATIVE mg/dL   Hgb urine dipstick NEGATIVE NEGATIVE   Bilirubin Urine NEGATIVE NEGATIVE   Ketones, ur NEGATIVE NEGATIVE mg/dL   Protein, ur NEGATIVE NEGATIVE mg/dL   Urobilinogen, UA 0.2 0.0 - 1.0 mg/dL   Nitrite NEGATIVE NEGATIVE   Leukocytes, UA SMALL (A) NEGATIVE  Urine microscopic-add on  Result Value Ref Range   Squamous Epithelial /  LPF RARE RARE   WBC, UA 0-2 <3 WBC/hpf  POC Urine Pregnancy, ED ([facility])  Result Value Ref Range   Preg Test, Ur NEGATIVE NEGATIVE   No results found.  MDM  Discussed with pharmacist. No signs of infection or mastitis Pyridium not optimal and patient is breast-feeding Plan lanolin to breast 4 times dality   pt to f/u with pmd regarding weight loss Final diagnoses:  None  Dx#1 left breast pain #2 dysuria      Doug SouSam Ronnett Pullin, MD 10/19/14 1806

## 2014-10-19 NOTE — Discharge Instructions (Signed)
Place LANOLIN on your left breast each after breast-feeding. Call your primary care physician for further evaluation regarding your weight loss. It is unsafe to prescribe medicine for you for discomfort with urination, as you are breast-feeding. It is safe to take Tylenol for discomfort

## 2014-10-19 NOTE — ED Notes (Signed)
All communication with patient done through translator line.

## 2014-11-11 ENCOUNTER — Other Ambulatory Visit: Payer: Self-pay | Admitting: Internal Medicine

## 2014-11-11 DIAGNOSIS — N63 Unspecified lump in unspecified breast: Secondary | ICD-10-CM

## 2014-11-17 ENCOUNTER — Other Ambulatory Visit: Payer: Self-pay

## 2014-11-21 ENCOUNTER — Ambulatory Visit
Admission: RE | Admit: 2014-11-21 | Discharge: 2014-11-21 | Disposition: A | Discharge status: Home / Self Care | Payer: No Typology Code available for payment source | Source: Ambulatory Visit | Attending: Internal Medicine | Admitting: Internal Medicine

## 2014-11-21 DIAGNOSIS — N63 Unspecified lump in unspecified breast: Secondary | ICD-10-CM

## 2014-12-20 ENCOUNTER — Encounter (HOSPITAL_COMMUNITY): Payer: Self-pay | Admitting: Emergency Medicine

## 2014-12-20 ENCOUNTER — Emergency Department (HOSPITAL_COMMUNITY)
Admission: EM | Admit: 2014-12-20 | Discharge: 2014-12-20 | Disposition: A | Discharge status: Home / Self Care | Payer: No Typology Code available for payment source | Source: Home / Self Care | Attending: Family Medicine | Admitting: Family Medicine

## 2014-12-20 DIAGNOSIS — N3000 Acute cystitis without hematuria: Secondary | ICD-10-CM

## 2014-12-20 DIAGNOSIS — L309 Dermatitis, unspecified: Secondary | ICD-10-CM

## 2014-12-20 LAB — POCT URINALYSIS DIP (DEVICE)
Bilirubin Urine: NEGATIVE
GLUCOSE, UA: NEGATIVE mg/dL
Hgb urine dipstick: NEGATIVE
Ketones, ur: NEGATIVE mg/dL
NITRITE: NEGATIVE
PROTEIN: NEGATIVE mg/dL
Specific Gravity, Urine: 1.015 (ref 1.005–1.030)
Urobilinogen, UA: 0.2 mg/dL (ref 0.0–1.0)
pH: 7 (ref 5.0–8.0)

## 2014-12-20 LAB — POCT PREGNANCY, URINE: Preg Test, Ur: NEGATIVE

## 2014-12-20 MED ORDER — CEPHALEXIN 500 MG PO CAPS: 500.0000 mg | ORAL_CAPSULE | Freq: Two times a day (BID) | ORAL | Status: DC

## 2014-12-20 MED ORDER — CLOBETASOL PROPIONATE 0.05 % EX OINT: 1.0000 "application " | TOPICAL_OINTMENT | Freq: Two times a day (BID) | CUTANEOUS | Status: DC

## 2014-12-20 NOTE — Discharge Instructions (Signed)
Thank you for coming in today.   Dermatitis de contacto (Contact Dermatitis) La dermatitis de contacto es una reaccin a ciertas sustancias que tocan la piel. Puede ser Ardelia Mems dermatitis de contacto irritante o alrgica. La dermatitis de contacto irritante no requiere exposicin previa a la sustancia que provoc la reaccin.La dermatitis alrgica slo ocurre si ha estado expuesto anteriormente a la sustancia. Al repetir la exposicin, el organismo reacciona a la sustancia.  CAUSAS  Muchas sustancias pueden causar dermatitis de contacto. La dermatitis irritante se produce cuando hay exposicin repetida a sustancias levemente irritantes, como por ejemplo:   Maquillaje.  Jabones.  Detergentes.  Lavandina.  cidos.  Sales metlicas, como el nquel. Las causas de la dermatitis alrgica son:   Plantas venenosas.  Sustancias qumicas (desodorantes, champs).  Bijouterie.  Ltex.  Neomicina en cremas con triple antibitico.  Conservantes en productos incluyendo en la ropa. SNTOMAS  En la zona de la piel que ha estado expuesta puede haber:   Sequedad o descamacin.  Enrojecimiento.  Grietas.  Picazn.  Dolor o sensacin de ardor.  Ampollas. En el caso de la dermatitis de Risk manager, puede haber slo hinchazn en algunas zonas, como la boca o los genitales.  DIAGNSTICO  El mdico podr hacer el diagnstico realizando un examen fsico. En los casos en que la causa es incierta y se sospecha una dermatitis de [place], le har una prueba en la piel con un parche para determinar la causa de la dermatitis. TRATAMIENTO  El tratamiento incluye la proteccin de la piel de nuevos contactos con la sustancia irritante, evitando la sustancia en lo posible. Puede ser de utilidad colocar una barrera como cremas, polvos y [place]. El mdico tambin podr recomendar:   Cremas o pomadas con corticoides aplicadas 2 veces por da. Para un mejor efecto, humedezca la zona con agua fresca  durante 20 minutos. Luego aplique el medicamento. Cubra la zona con un vendaje plstico. Puede almacenar la crema con corticoides en el refrigerador para Research scientist (medical) "refrescante" sobre la erupcin que har aliviar la picazn. Esto aliviar la picazn. En los casos ms graves ser necesario aplicar corticoides por va oral.  Ungentos con antibiticos o antibacterianos, si hay una infeccin en la piel.  Antihistamnicos en forma de locin o por va oral para calmar la picazn.  Lubricantes para mantener la humectacin de la piel.  La solucin de [place] para reducir el enrojecimiento y Conservation officer, historic buildings o para secar una erupcin que supura. Mezcle un paquete o tableta en dos tazas de agua fra. Moje un pao limpio en la solucin, escrralo un poco y colquelo en el rea afectada. Djelo en el lugar durante 30 minutos. Repita el procedimiento todas las veces que pueda a lo largo del Training and development officer.  Hgase baos con almidn o bicarbonato todos los das si la zona es demasiado extensa como para cubrirla con una toallita. Algunas sustancias qumicas, como los lcalis o los cidos pueden daar la piel del mismo modo que [place]. Enjuague la piel durante 15 a 20 minutos con agua fra despus de la exposicin a esas sustancias. Tambin busque atencin mdica de inmediato. En los casos de piel muy irritada, ser necesario aplicar (vendajes), antibiticos y analgsicos.  INSTRUCCIONES PARA EL CUIDADO EN EL HOGAR   Evite lo que ha causado la erupcin.  Mantenga el rea de la piel afectada sin contacto con el agua caliente, el jabn, la luz solar, las sustancias qumicas, sustancias cidas o todo lo que la irrite.  No se rasque  la lesin. El rascado puede hacer que la erupcin se infecte.  Puede tomar baos con agua fresca para detener la picazn.  Tome slo medicamentos de venta libre o recetados, segn las indicaciones del mdico.  Consulting civil engineer a las visitas de control segn las indicaciones, para asegurarse de que  la piel se est curando Product manager. SOLICITE ATENCIN MDICA SI:   El problema no mejora luego de 3 das de [place].  Se siente empeorar.  Observa signos de infeccin, como hinchazn, sensibilidad, inflamacin, enrojecimiento o aumenta la temperatura en la zona afectada.  Tiene nuevos problemas debido a los medicamentos. Document Released: 06/29/2005 Document Revised: 12/12/2011 [facility] Patient Information 2015 [place]. This information is not intended to replace advice given to you by your health care provider. Make sure you discuss any questions you have with your health care provider.   Infeccin urinaria  (Urinary Tract Infection)  La infeccin urinaria puede ocurrir en Clinical cytogeneticist del tracto urinario. El tracto urinario es un sistema de drenaje del cuerpo por el que se eliminan los desechos y el exceso de [place]. El tracto urinario est formado por dos riones, dos urteres, la vejiga y Geologist, engineering. Los riones son rganos que tienen forma de frijol. Cada rin tiene aproximadamente el tamao del puo. Estn situados debajo de las [place], uno a cada lado de la columna vertebral CAUSAS  La causa de la infeccin son los microbios, que son organismos microscpicos, que incluyen hongos, virus, y bacterias. Estos organismos son tan pequeos que slo pueden verse a travs del microscopio. Las bacterias son los microorganismos que ms comnmente causan infecciones urinarias.  SNTOMAS  Los sntomas pueden variar segn la edad y el sexo del paciente y por la ubicacin de la infeccin. Los sntomas en las mujeres jvenes incluyen la necesidad frecuente e intensa de orinar y una sensacin dolorosa de ardor en la vejiga o en la uretra durante la miccin. Las mujeres y los hombres mayores podrn sentir cansancio, temblores y debilidad y Arts development officer musculares y Social research officer, government abdominal. Si tiene [place], puede significar que la infeccin est en los riones. Otros sntomas son dolor en la  espalda o en los lados debajo de las [place], nuseas y vmitos.  DIAGNSTICO  Para diagnosticar una infeccin urinaria, el mdico le preguntar acerca de sus sntomas. [employer] una [place] de [place]. La muestra de orina se analiza para Hydrographic surveyor bacterias y glbulos blancos de Herbalist. Los glbulos blancos se forman en el organismo para ayudar a Radio broadcast assistant las infecciones.  TRATAMIENTO  Por lo general, las infecciones urinarias pueden tratarse con medicamentos. Debido a que la [employer] de las infecciones son causadas por bacterias, por lo general pueden tratarse con antibiticos. La eleccin del antibitico y la duracin del tratamiento depender de sus sntomas y el tipo de bacteria causante de la infeccin.  INSTRUCCIONES PARA EL CUIDADO EN EL HOGAR   Si le recetaron antibiticos, tmelos exactamente como su mdico le indique. Termine el medicamento aunque se sienta mejor despus de haber tomado slo algunos.  Beba gran cantidad de lquido para mantener la orina de tono claro o color amarillo plido.  Evite la cafena, el t y las bebidas gaseosas. Estas sustancias irritan la vejiga.  Vaciar la vejiga con frecuencia. Evite retener la orina durante largos perodos.  Vace la vejiga antes y despus de Clinical biochemist.  Despus de mover el intestino, las mujeres deben higienizarse la regin perineal desde adelante hacia atrs. Use slo un papel tissue por vez. SOLICITE ATENCIN  MDICA SI:   Siente dolor en la espalda.  Le sube la fiebre.  Los sntomas no mejoran luego de 3 das. SOLICITE ATENCIN MDICA DE INMEDIATO SI:   Siente dolor intenso en la espalda o en la zona inferior del abdomen.  Comienza a sentir escalofros.  Tiene nuseas o vmitos.  Tiene una sensacin continua de quemazn o molestias al [employer]. ASEGRESE DE QUE:   Comprende estas instrucciones.  Controlar su enfermedad.  Solicitar ayuda de inmediato si no mejora o empeora. Document  Released: 06/29/2005 Document Revised: 06/13/2012 [facility] Patient Information 2015 [place]. This information is not intended to replace advice given to you by your health care provider. Make sure you discuss any questions you have with your health care provider.

## 2014-12-20 NOTE — ED Provider Notes (Signed)
[name] is a 29 y.o. female who presents to Urgent Care today for pruritic hands and urinary tract infection. Patient is a 3 months history of itchy hands bilaterally associated with small papular rash. She's been treated for scabies which did not help at all. She's using triamcinolone cream intermittently which helped a bit. No injury fevers chills nausea vomiting or diarrhea. No new soaps or shampoos cosmetics or medicines. Nobody else has a similar rash.   Past Medical History  Diagnosis Date  . Eating disorder   . Bulimia    Past Surgical History  Procedure Laterality Date  . No past surgeries     History  Substance Use Topics  . Smoking status: Never Smoker   . Smokeless tobacco: Never Used  . Alcohol Use: No   ROS as above Medications: No current facility-administered medications for this encounter.   Current Outpatient Prescriptions  Medication Sig Dispense Refill  . cephALEXin (KEFLEX) 500 MG capsule Take 1 capsule (500 mg total) by mouth 2 (two) times daily. 14 capsule 0  . clobetasol ointment (TEMOVATE) 0.05 % Apply 1 application topically 2 (two) times daily. 60 g 6   No Known Allergies   Exam:  BP 101/65 mmHg  Pulse 83  Temp(Src) 98.2 F (36.8 C) (Oral)  Resp 18  SpO2 97%  Breastfeeding? No Gen: Well NAD HEENT: EOMI,  MMM Lungs: Normal work of breathing. CTABL Heart: RRR no MRG Abd: NABS, Soft. Nondistended, Nontender Exts: Brisk capillary refill, warm and well perfused.  Skin: Hands are relatively normal appearing with mild scattered erythematous papules.  Results for orders placed or performed during the hospital encounter of 12/20/14 (from the past 24 hour(s))  POCT urinalysis dip (device)     Status: Abnormal   Collection Time: 12/20/14 12:17 PM  Result Value Ref Range   Glucose, UA NEGATIVE NEGATIVE mg/dL   Bilirubin Urine NEGATIVE NEGATIVE   Ketones, ur NEGATIVE NEGATIVE mg/dL   Specific Gravity, Urine 1.015 1.005 - 1.030   Hgb urine  dipstick NEGATIVE NEGATIVE   pH 7.0 5.0 - 8.0   Protein, ur NEGATIVE NEGATIVE mg/dL   Urobilinogen, UA 0.2 0.0 - 1.0 mg/dL   Nitrite NEGATIVE NEGATIVE   Leukocytes, UA SMALL (A) NEGATIVE  Pregnancy, urine POC     Status: None   Collection Time: 12/20/14 12:29 PM  Result Value Ref Range   Preg Test, Ur NEGATIVE NEGATIVE   No results found.  Assessment and Plan: 29 y.o. female with contact dermatitis treat with clobetasol ointment.  Discussed warning signs or symptoms. Please see discharge instructions. Patient expresses understanding.     Rodolph BongEvan S Wilhelmine Krogstad, MD 12/20/14 1321

## 2014-12-20 NOTE — ED Notes (Signed)
C/o bilateral hand rash onset 3 months Seen here before in the past for similar sx; given kenalog and permethrin w/no relief Alert, no signs of acute distress.

## 2014-12-23 LAB — URINE CULTURE
Colony Count: 75000
Special Requests: NORMAL

## 2014-12-25 NOTE — ED Notes (Signed)
Urine culture: 75,000 colonies E. Coli. Pt. adequately treated with Keflex. Vassie MoselleYork, Rossi Burdo M 12/25/2014

## 2015-01-19 ENCOUNTER — Emergency Department (HOSPITAL_COMMUNITY)
Admission: EM | Admit: 2015-01-19 | Discharge: 2015-01-19 | Disposition: A | Discharge status: Home / Self Care | Payer: No Typology Code available for payment source | Source: Home / Self Care | Attending: Family Medicine | Admitting: Family Medicine

## 2015-01-19 ENCOUNTER — Encounter (HOSPITAL_COMMUNITY): Payer: Self-pay | Admitting: Family Medicine

## 2015-01-19 DIAGNOSIS — N3010 Interstitial cystitis (chronic) without hematuria: Secondary | ICD-10-CM

## 2015-01-19 DIAGNOSIS — R300 Dysuria: Secondary | ICD-10-CM

## 2015-01-19 DIAGNOSIS — R109 Unspecified abdominal pain: Secondary | ICD-10-CM

## 2015-01-19 LAB — POCT URINALYSIS DIP (DEVICE)
BILIRUBIN URINE: NEGATIVE
GLUCOSE, UA: NEGATIVE mg/dL
Hgb urine dipstick: NEGATIVE
Ketones, ur: NEGATIVE mg/dL
Nitrite: NEGATIVE
PROTEIN: NEGATIVE mg/dL
Specific Gravity, Urine: 1.01 (ref 1.005–1.030)
UROBILINOGEN UA: 0.2 mg/dL (ref 0.0–1.0)
pH: 5.5 (ref 5.0–8.0)

## 2015-01-19 LAB — POCT PREGNANCY, URINE: PREG TEST UR: NEGATIVE

## 2015-01-19 MED ORDER — PHENAZOPYRIDINE HCL 100 MG PO TABS: 100.0000 mg | ORAL_TABLET | Freq: Three times a day (TID) | ORAL | Status: DC | PRN

## 2015-01-19 MED ORDER — OXYBUTYNIN CHLORIDE 5 MG PO TABS: 5.0000 mg | ORAL_TABLET | Freq: Three times a day (TID) | ORAL | Status: DC | PRN

## 2015-01-19 NOTE — Discharge Instructions (Signed)
There is no direct evidence of infection today. Your pain is likely due to a condition called interstitial cystitis. This will be best treated by Pyridium for pain and oxybutynin for bladder spasm. The Pyridium may turn her urine orange to red. We will send a urine culture and call you if you need more antibiotics. He may to follow-up with a urologist.  No hay evidencia directa de la infeccin en la actualidad. Su dolor es probablemente debido a una condicin llamada cistitis intersticial . Esto ser mejor tratada por Pyridium para el dolor y la oxibutinina para el espasmo de la vejiga . El Pyridium puede convertir su orina de color naranja a rojo . Le enviaremos un cultivo de [place] y llamar si necesita ms antibiticos . Se puede dar seguimiento con un urlogo .

## 2015-01-19 NOTE — ED Notes (Signed)
Pt  Reports      Symptoms  Of  Dysuria   And  Difficulty  Urinating  And  Emptying  Her  Bladder           She  Has  Been  Seen  Recently  For  uti  In  Past  X  2     With  Rounds of  Anti biotics

## 2015-01-19 NOTE — ED Provider Notes (Signed)
CSN: [id number]     Arrival date & time 01/19/15  0945 History   First MD Initiated Contact with Patient 01/19/15 1101     Chief Complaint  Patient presents with  . Back Pain   (Consider location/radiation/quality/duration/timing/severity/associated sxs/prior Treatment) HPI  Encounter aided by interpreter  Dysuria started 3 wks ago. Getting worse. Associated with Frequency. Now w/ R side pain.  Right-sided pain is worse with certain movements. Dx w/ UTI on 12/20/14 and started on keflex w/o relief. Saw PCP the day after finishing initial antibiotic course and given another round of ABX w/o relief.    Denies fevers, nausea, vomiting, abdominal pain, diarrhea, constipation, rash, chest pain, shortness of breath, palpitation.   Past Medical History  Diagnosis Date  . Eating disorder   . Bulimia    Past Surgical History  Procedure Laterality Date  . No past surgeries     Family History  Problem Relation Age of Onset  . Hearing loss Neg Hx   . Cancer Neg Hx   . Diabetes Neg Hx   . Heart failure Neg Hx    History  Substance Use Topics  . Smoking status: Never Smoker   . Smokeless tobacco: Never Used  . Alcohol Use: No   OB History    Gravida Para Term Preterm AB TAB SAB Ectopic Multiple Living   3 3 3       3      Review of Systems Per HPI with all other pertinent systems negative.   Allergies  Review of patient's allergies indicates no known allergies.  Home Medications   Prior to Admission medications   Medication Sig Start Date End Date Taking? Authorizing Provider  clobetasol ointment (TEMOVATE) 0.05 % Apply 1 application topically 2 (two) times daily. 12/20/14   Rodolph BongEvan S Corey, MD  oxybutynin (DITROPAN) 5 MG tablet Take 1 tablet (5 mg total) by mouth 3 (three) times daily as needed for bladder spasms. 01/19/15   Ozella Rocksavid J Zell Hylton, MD  phenazopyridine (PYRIDIUM) 100 MG tablet Take 1-2 tablets (100-200 mg total) by mouth 3 (three) times daily as needed for pain.  01/19/15   Ozella Rocksavid J Anjeanette Petzold, MD   BP 114/74 mmHg  Pulse 82  Temp(Src) 98.3 F (36.8 C) (Oral)  Resp 14  SpO2 97% Physical Exam Physical Exam  Constitutional: oriented to person, place, and time. appears well-developed and well-nourished. No distress.  HENT:  Head: Normocephalic and atraumatic.  Eyes: EOMI. PERRL.  Neck: Normal range of motion.  Cardiovascular: RRR, no m/r/g, 2+ distal pulses,  Pulmonary/Chest: Effort normal and breath sounds normal. No respiratory distress.  Abdominal: Soft. Bowel sounds are normal. NonTTP, no distension.  Musculoskeletal: Normal range of motion. Non ttp, no effusion.  no CVA tenderness. Back and flank nontender to palpation.  Neurological: alert and oriented to person, place, and time.  Skin: Skin is warm. No rash noted. non diaphoretic.  Psychiatric: normal mood and affect. behavior is normal. Judgment and thought content normal.   ED Course  Procedures (including critical care time) Labs Review Labs Reviewed  POCT URINALYSIS DIP (DEVICE) - Abnormal; Notable for the following:    Leukocytes, UA TRACE (*)    All other components within normal limits  URINE CULTURE  POCT PREGNANCY, URINE    Imaging Review No results found.   MDM   1. Interstitial cystitis   2. Dysuria   3. Side pain    UA with only trace leuks. Suspect interstitial cystitis versus psychosomatic condition as patient with  history of eating disorder and other social stresses. We'll send for urine culture. Will start patient on oxybutynin Pyridium. Patient to follow-up with urology if needed. Side pain is likely likely muscle skeletal nature is no significant concern for pyelonephritis at this time. Anti-inflammatories for relief.    Ozella Rocks, MD 01/19/15 1146

## 2015-01-20 LAB — URINE CULTURE
COLONY COUNT: NO GROWTH
CULTURE: NO GROWTH

## 2015-06-13 ENCOUNTER — Emergency Department (HOSPITAL_COMMUNITY)
Admission: EM | Admit: 2015-06-13 | Discharge: 2015-06-13 | Disposition: A | Discharge status: Home / Self Care | Payer: Self-pay | Attending: Emergency Medicine | Admitting: Emergency Medicine

## 2015-06-13 ENCOUNTER — Emergency Department (HOSPITAL_COMMUNITY): Payer: Self-pay

## 2015-06-13 ENCOUNTER — Encounter (HOSPITAL_COMMUNITY): Payer: Self-pay | Admitting: *Deleted

## 2015-06-13 DIAGNOSIS — R748 Abnormal levels of other serum enzymes: Secondary | ICD-10-CM

## 2015-06-13 DIAGNOSIS — K644 Residual hemorrhoidal skin tags: Secondary | ICD-10-CM | POA: Insufficient documentation

## 2015-06-13 DIAGNOSIS — R0789 Other chest pain: Secondary | ICD-10-CM

## 2015-06-13 DIAGNOSIS — K648 Other hemorrhoids: Secondary | ICD-10-CM

## 2015-06-13 DIAGNOSIS — R0602 Shortness of breath: Secondary | ICD-10-CM | POA: Insufficient documentation

## 2015-06-13 LAB — URINALYSIS, ROUTINE W REFLEX MICROSCOPIC
Bilirubin Urine: NEGATIVE
Glucose, UA: NEGATIVE mg/dL
HGB URINE DIPSTICK: NEGATIVE
Ketones, ur: NEGATIVE mg/dL
Nitrite: NEGATIVE
PROTEIN: NEGATIVE mg/dL
Specific Gravity, Urine: 1.016 (ref 1.005–1.030)
UROBILINOGEN UA: 0.2 mg/dL (ref 0.0–1.0)
pH: 5.5 (ref 5.0–8.0)

## 2015-06-13 LAB — BASIC METABOLIC PANEL
ANION GAP: 8 (ref 5–15)
BUN: 10 mg/dL (ref 6–20)
CO2: 25 mmol/L (ref 22–32)
CREATININE: 0.61 mg/dL (ref 0.44–1.00)
Calcium: 9.6 mg/dL (ref 8.9–10.3)
Chloride: 104 mmol/L (ref 101–111)
GFR calc non Af Amer: >60 mL/min (ref >60)
Glucose, Bld: 97 mg/dL (ref 65–99)
Potassium: 4.3 mmol/L (ref 3.5–5.1)
Sodium: 137 mmol/L (ref 135–145)

## 2015-06-13 LAB — I-STAT TROPONIN, ED
TROPONIN I, POC: 0 ng/mL (ref 0.00–0.08)
Troponin i, poc: 0 ng/mL (ref 0.00–0.08)

## 2015-06-13 LAB — HEPATIC FUNCTION PANEL
ALK PHOS: 90 U/L (ref 38–126)
ALT: 15 U/L (ref 14–54)
AST: 20 U/L (ref 15–41)
Albumin: 4.7 g/dL (ref 3.5–5.0)
Bilirubin, Direct: <0.1 mg/dL — ABNORMAL LOW (ref 0.1–0.5)
TOTAL PROTEIN: 7.4 g/dL (ref 6.5–8.1)
Total Bilirubin: 0.5 mg/dL (ref 0.3–1.2)

## 2015-06-13 LAB — CBC
HCT: 43 % (ref 36.0–46.0)
HEMOGLOBIN: 14.5 g/dL (ref 12.0–15.0)
MCH: 29.9 pg (ref 26.0–34.0)
MCHC: 33.7 g/dL (ref 30.0–36.0)
MCV: 88.7 fL (ref 78.0–100.0)
Platelets: 387 10*3/uL (ref 150–400)
RBC: 4.85 MIL/uL (ref 3.87–5.11)
RDW: 12.8 % (ref 11.5–15.5)
WBC: 5.5 10*3/uL (ref 4.0–10.5)

## 2015-06-13 LAB — URINE MICROSCOPIC-ADD ON

## 2015-06-13 LAB — LIPASE, BLOOD: Lipase: 58 U/L — ABNORMAL HIGH (ref 22–51)

## 2015-06-13 LAB — D-DIMER, QUANTITATIVE: D-Dimer, Quant: <0.27 ug/mL-FEU (ref 0.00–0.48)

## 2015-06-13 LAB — POC OCCULT BLOOD, ED: Fecal Occult Bld: NEGATIVE

## 2015-06-13 MED ORDER — HYDROCORTISONE ACETATE 25 MG RE SUPP: 25.0000 mg | Freq: Two times a day (BID) | RECTAL | Status: DC

## 2015-06-13 MED ORDER — IBUPROFEN 600 MG PO TABS: 600.0000 mg | ORAL_TABLET | Freq: Four times a day (QID) | ORAL | Status: DC | PRN

## 2015-06-13 MED ORDER — CYCLOBENZAPRINE HCL 5 MG PO TABS: 5.0000 mg | ORAL_TABLET | Freq: Three times a day (TID) | ORAL | Status: DC | PRN

## 2015-06-13 NOTE — ED Provider Notes (Signed)
CSN: [id number]     Arrival date & time 06/13/15  1004 History   First MD Initiated Contact with Patient 06/13/15 1237     Chief Complaint  Patient presents with  . Chest Pain     (Consider location/radiation/quality/duration/timing/severity/associated sxs/prior Treatment) The history is provided by the patient.  [name] is a 29 y.o. female here with chest pain, palpitations, blood in stool. Patient has been having intermittent palpitations for the last 2 weeks. Palpitations worse at night and getting more frequent. Also has some subjective shortness of breath associated with it and mild pleuritic chest pain. Denies any hx of CAD or PE or recent travel. She also has some blood in her stool for the last several days. She states that her stool is normal color with blood around the stool. Denies vomiting or diarrhea or fevers. Patient has depo and denies being pregnant.   [employer] interpreter used   Past Medical History  Diagnosis Date  . Eating disorder   . Bulimia    Past Surgical History  Procedure Laterality Date  . No past surgeries     Family History  Problem Relation Age of Onset  . Hearing loss Neg Hx   . Cancer Neg Hx   . Diabetes Neg Hx   . Heart failure Neg Hx    Social History  Substance Use Topics  . Smoking status: Never Smoker   . Smokeless tobacco: Never Used  . Alcohol Use: No   OB History    Gravida Para Term Preterm AB TAB SAB Ectopic Multiple Living   Review of Systems  Respiratory: Positive for shortness of breath.   Cardiovascular: Positive for chest pain.  All other systems reviewed and are negative.     Allergies  Review of patient's allergies indicates no known allergies.  Home Medications   Prior to Admission medications   Medication Sig Start Date End Date Taking? Authorizing Provider  cyclobenzaprine (FLEXERIL) 5 MG tablet Take 1 tablet (5 mg total) by mouth 3 (three) times daily as needed for muscle spasms.  06/13/15   Richardean Canal, MD  hydrocortisone (ANUSOL-HC) 25 MG suppository Place 1 suppository (25 mg total) rectally 2 (two) times daily. For 7 days 06/13/15   Richardean Canal, MD  ibuprofen (ADVIL,MOTRIN) 600 MG tablet Take 1 tablet (600 mg total) by mouth every 6 (six) hours as needed. 06/13/15   Richardean Canal, MD   BP 113/70 mmHg  Pulse 80  Temp(Src) 98.5 F (36.9 C) (Oral)  Resp 16  SpO2 99% Physical Exam  Constitutional: She is oriented to person, place, and time. She appears well-developed and well-nourished.  HENT:  Head: Normocephalic.  Mouth/Throat: Oropharynx is clear and moist.  Eyes: Conjunctivae are normal. Pupils are equal, round, and reactive to light.  Neck: Normal range of motion. Neck supple.  Cardiovascular: Regular rhythm and normal heart sounds.   Borderline tachy   Pulmonary/Chest: Effort normal and breath sounds normal. No respiratory distress. She has no wheezes. She has no rales.  Abdominal: Soft. Bowel sounds are normal. She exhibits no distension. There is no rebound.  Minimal epigastric tenderness, no CVAT   Genitourinary:  Rectal- small external hemorrhoid, not thrombosed   Musculoskeletal: Normal range of motion.  Neurological: She is alert and oriented to person, place, and time. No cranial nerve deficit. Coordination normal.  Skin: Skin is warm and dry.  Psychiatric: She has  a normal mood and affect. Her behavior is normal. Judgment and thought content normal.  Nursing note and vitals reviewed.   ED Course  Procedures (including critical care time) Labs Review Labs Reviewed  URINALYSIS, ROUTINE W REFLEX MICROSCOPIC (NOT AT [facility]) - Abnormal; Notable for the following:    Leukocytes, UA TRACE (*)    All other components within normal limits  HEPATIC FUNCTION PANEL - Abnormal; Notable for the following:    Bilirubin, Direct <0.1 (*)    All other components within normal limits  LIPASE, BLOOD - Abnormal; Notable for the following:    Lipase 58 (*)    All  other components within normal limits  URINE MICROSCOPIC-ADD ON - Abnormal; Notable for the following:    Squamous Epithelial / LPF FEW (*)    All other components within normal limits  BASIC METABOLIC PANEL  CBC  D-DIMER, QUANTITATIVE (NOT AT [facility])  I-STAT TROPOININ, ED  I-STAT TROPOININ, ED  POC OCCULT BLOOD, ED    Imaging Review Dg Chest 2 View  06/13/2015   CLINICAL DATA:  Left-sided chest pain for 2 weeks  EXAM: CHEST  2 VIEW  COMPARISON:  08/12/2013  FINDINGS: Normal heart size. There is no pleural effusion or edema identified. No airspace consolidation noted. The visualized osseous structures appear normal.  IMPRESSION: 1. No acute findings.   Electronically Signed   By: Signa Kell M.D.   On: 06/13/2015 11:24   I have personally reviewed and evaluated these images and lab results as part of my medical decision-making.   EKG Interpretation   Date/Time:  Saturday June 13 2015 10:18:35 EDT Ventricular Rate:  90 PR Interval:  138 QRS Duration: 88 QT Interval:  376 QTC Calculation: 459 R Axis:   12 Text Interpretation:  Normal sinus rhythm Possible Anterior infarct , age  undetermined Abnormal ECG No significant change since last tracing  Confirmed by Nusaiba Guallpa  MD, Laelle Bridgett ([id number]) on 06/13/2015 12:38:46 PM      MDM   Final diagnoses:  Other chest pain  Elevated lipase  Other hemorrhoids   [name] is a 29 y.o. female here with palpitations, chest pain, blood in stool. Borderline tachy on birth control so will get d-dimer. Symptoms intermittently for several weeks. Vitals stable. I doubt ACS. Blood in stool likely from hemorrhoids. Will check labs, trop, d-dimer, occ.  4pm Occult neg. Hg stable. D-dimer neg. Trop neg. CXR clear. Has some reproducible chest tenderness now. Also lipase 65, but has been in the 70s before and has nl LFTs. I think likely mild viral gastro vs MSK chest pain. No vomiting in the ED, appears well hydrated. Will dc home with motrin,  flexeril, anusol (for external hemorroids). Updated patient with translator phone again.    Richardean Canal, MD 06/13/15 [phone]

## 2015-06-13 NOTE — ED Notes (Signed)
Pt to br  Urine sent.  Pt comfortable  No distress

## 2015-06-13 NOTE — Discharge Instructions (Signed)
Stay hydrated.   Take motrin for pain.  Take flexeril for muscle spasms.   Use anusol twice daily for hemorrhoids   See your doctor.   Return to ER if you have severe pain, shortness of breath, palpitations, more rectal bleeding.

## 2015-06-13 NOTE — ED Notes (Signed)
Used interpretor phone, pt is having left side chest pain x 2 weeks and palpitations that get worse when she lays down at night. Also having pain into left arm. No acute distress noted at triage, ekg done.

## 2015-06-13 NOTE — ED Notes (Signed)
RN used an interpreter to discharge pt. Pt acknowledges understanding of discharge instructions

## 2015-10-05 ENCOUNTER — Emergency Department (HOSPITAL_COMMUNITY)
Admission: EM | Admit: 2015-10-05 | Discharge: 2015-10-05 | Disposition: A | Discharge status: Home / Self Care | Payer: Self-pay | Attending: Emergency Medicine | Admitting: Emergency Medicine

## 2015-10-05 ENCOUNTER — Encounter (HOSPITAL_COMMUNITY): Payer: Self-pay

## 2015-10-05 DIAGNOSIS — M549 Dorsalgia, unspecified: Secondary | ICD-10-CM

## 2015-10-05 DIAGNOSIS — K5900 Constipation, unspecified: Secondary | ICD-10-CM | POA: Insufficient documentation

## 2015-10-05 DIAGNOSIS — Z79899 Other long term (current) drug therapy: Secondary | ICD-10-CM | POA: Insufficient documentation

## 2015-10-05 DIAGNOSIS — R300 Dysuria: Secondary | ICD-10-CM | POA: Insufficient documentation

## 2015-10-05 DIAGNOSIS — M542 Cervicalgia: Secondary | ICD-10-CM | POA: Insufficient documentation

## 2015-10-05 DIAGNOSIS — R509 Fever, unspecified: Secondary | ICD-10-CM | POA: Insufficient documentation

## 2015-10-05 DIAGNOSIS — R51 Headache: Secondary | ICD-10-CM | POA: Insufficient documentation

## 2015-10-05 DIAGNOSIS — Z8659 Personal history of other mental and behavioral disorders: Secondary | ICD-10-CM | POA: Insufficient documentation

## 2015-10-05 DIAGNOSIS — Z3202 Encounter for pregnancy test, result negative: Secondary | ICD-10-CM | POA: Insufficient documentation

## 2015-10-05 LAB — BASIC METABOLIC PANEL
ANION GAP: 7 (ref 5–15)
BUN: 10 mg/dL (ref 6–20)
CHLORIDE: 109 mmol/L (ref 101–111)
CO2: 25 mmol/L (ref 22–32)
Calcium: 9.5 mg/dL (ref 8.9–10.3)
Creatinine, Ser: 0.6 mg/dL (ref 0.44–1.00)
GFR calc non Af Amer: >60 mL/min (ref >60)
Glucose, Bld: 98 mg/dL (ref 65–99)
POTASSIUM: 4.2 mmol/L (ref 3.5–5.1)
Sodium: 141 mmol/L (ref 135–145)

## 2015-10-05 LAB — CBC WITH DIFFERENTIAL/PLATELET
BASOS PCT: 0 %
Basophils Absolute: 0 10*3/uL (ref 0.0–0.1)
Eosinophils Absolute: 0 10*3/uL (ref 0.0–0.7)
Eosinophils Relative: 1 %
HEMATOCRIT: 40.3 % (ref 36.0–46.0)
HEMOGLOBIN: 13.6 g/dL (ref 12.0–15.0)
Lymphocytes Relative: 35 %
Lymphs Abs: 2 10*3/uL (ref 0.7–4.0)
MCH: 30.1 pg (ref 26.0–34.0)
MCHC: 33.7 g/dL (ref 30.0–36.0)
MCV: 89.2 fL (ref 78.0–100.0)
Monocytes Absolute: 0.3 10*3/uL (ref 0.1–1.0)
Monocytes Relative: 5 %
NEUTROS ABS: 3.3 10*3/uL (ref 1.7–7.7)
NEUTROS PCT: 59 %
Platelets: 333 10*3/uL (ref 150–400)
RBC: 4.52 MIL/uL (ref 3.87–5.11)
RDW: 12.3 % (ref 11.5–15.5)
WBC: 5.6 10*3/uL (ref 4.0–10.5)

## 2015-10-05 LAB — URINALYSIS, ROUTINE W REFLEX MICROSCOPIC
Bilirubin Urine: NEGATIVE
Glucose, UA: NEGATIVE mg/dL
Hgb urine dipstick: NEGATIVE
Ketones, ur: NEGATIVE mg/dL
LEUKOCYTES UA: NEGATIVE
NITRITE: NEGATIVE
Protein, ur: NEGATIVE mg/dL
Specific Gravity, Urine: 1.015 (ref 1.005–1.030)
pH: 7 (ref 5.0–8.0)

## 2015-10-05 LAB — POC URINE PREG, ED: PREG TEST UR: NEGATIVE

## 2015-10-05 MED ORDER — PHENAZOPYRIDINE HCL 95 MG PO TABS: 95.0000 mg | ORAL_TABLET | Freq: Three times a day (TID) | ORAL | Status: DC | PRN

## 2015-10-05 MED ORDER — IBUPROFEN 600 MG PO TABS: 600.0000 mg | ORAL_TABLET | Freq: Four times a day (QID) | ORAL | Status: DC | PRN

## 2015-10-05 NOTE — Discharge Instructions (Signed)
1. Medications: pyridium, ibuprofen, usual home medications 2. Treatment: rest, drink plenty of fluids 3. Follow Up: please followup with your primary doctor for discussion of your diagnoses and further evaluation after today's visit; if you do not have a primary care doctor use the resource guide provided to find one; please return to the ER for severe pain, numbness, weakness, new or worsening symptoms   Dolor de espalda en adultos (Back Pain, Adult) El dolor de espalda es muy frecuente en los adultos.La causa del dolor de espalda es rara vez peligrosa y Chief Technology Officer a menudo mejora con el [place].Es posible que se desconozca la causa de esta afeccin. Algunas causas comunes son las siguientes:  Distensin de los msculos o ligamentos que sostienen la columna vertebral.  Chiropractor (degeneracin) de los discos vertebrales.  Artritis.  Lesiones directas en la espalda. En Yahoo, el dolor de espalda es recurrente. Como rara vez es peligroso, las personas pueden aprender a Psychologist, clinical afeccin por s mismas. INSTRUCCIONES PARA EL CUIDADO EN EL HOGAR Controle su dolor de espalda a fin de Public house manager cambio. Las siguientes indicaciones ayudarn a Architectural technologist que pueda sentir:  Medical illustrator. Si permanece sentado o de pie en un mismo lugar durante mucho tiempo, se tensiona la espalda. No se siente, conduzca o permanezca de pie en un mismo lugar durante ms de 30 minutos seguidos. Realice caminatas cortas en superficies planas tan pronto como le sea posible.Trate de caminar un poco ms de Pharmacist, community.  Haga ejercicio regularmente como se lo haya indicado el mdico. El ejercicio ayuda a que su espalda se cure ms rpidamente. Tambin ayuda a prevenir futuras lesiones al [employer] fuertes y flexibles.  No permanezca en la cama.Si hace reposo ms de 1 a 2 das, puede demorar su recuperacin.  Preste atencin a su cuerpo al inclinarse y levantarse. Las  posiciones ms cmodas son las que ejercen menos tensin en la espalda en recuperacin. Siempre use tcnicas apropiadas para levantar objetos, como por ejemplo:  Flexionar las rodillas.  Mantener la carga cerca del cuerpo.  No torcerse.  Encuentre una posicin cmoda para dormir. Use un colchn firme y recustese de costado con las rodillas ligeramente flexionadas. Si se recuesta Fisher Scientific, coloque una almohada debajo de las rodillas.  Evite sentir ansiedad o estrs.El estrs aumenta la tensin muscular y puede empeorar el dolor de espalda.Es importante reconocer si se siente ansioso o estresado y aprender maneras de controlarlo, por ejemplo haciendo ejercicio.  Tome los medicamentos solamente como se lo haya indicado el mdico. Los medicamentos de venta libre para Engineer, materials y la inflamacin a menudo son los ms eficaces.El mdico puede recetarle relajantes musculares.Estos medicamentos ayudan a Primary school teacher de modo que pueda reanudar ms rpidamente sus actividades normales y el ejercicio saludable.  Aplique hielo sobre la zona lesionada.  Ponga el hielo en una bolsa plstica.  Coloque una toalla entre la piel y la bolsa de hielo.  Deje el hielo durante , 2 a 3veces por da, durante los primeros 2 o 3das. Despus de eso, puede alternar el hielo y el calor para reducir Chief Technology Officer y los espasmos.  Mantenga un peso saludable. El exceso de peso ejerce presin adicional sobre la espalda y hace que resulte difcil mantener una buena [place]. SOLICITE ATENCIN MDICA SI:  Siente un dolor que no se alivia con reposo o medicamentos.  Siente mucho dolor que se extiende a las piernas o los glteos.  El  dolor no mejora en una semana.  Siente dolor por la noche.  Pierde peso.  Siente escalofros o fiebre. SOLICITE ATENCIN MDICA DE INMEDIATO SI:   Tiene nuevos problemas para controlar la vejiga o los intestinos.  Siente debilidad o adormecimiento inusuales  en los brazos o en las piernas.  Siente nuseas o vmitos.  Siente dolor abdominal.  Siente que va a desmayarse.   Esta informacin no tiene Theme park manager el consejo del mdico. Asegrese de hacerle al mdico cualquier pregunta que tenga.   Document Released: 09/19/2005 Document Revised: 10/10/2014 Elsevier Interactive Patient Education [employer]  Disuria (Dysuria) La disuria es dolor o molestia al Geographical information systems officer. El dolor o la molestia se pueden sentir en el conducto que transporta la orina fuera de la vejiga (uretra) o en el tejido que rodea los genitales. El dolor tambin se puede sentir en la zona de la ingle y en la parte inferior del abdomen y de la espalda. Quizs tenga que orinar con frecuencia o la sensacin repentina de tener que orinar (tenesmo vesical). La disuria puede afectar tanto a hombres como a mujeres, pero es ms comn en las mujeres. La causa puede deberse a muchos problemas diferentes:  Infeccin en las vas urinarias en mujeres.  Infeccin en los riones o la vejiga.  Clculos en los riones o la vejiga.  Ciertas enfermedades de transmisin sexual (ETS), como la clamidia.  Deshidratacin.  Inflamacin de la vagina.  Uso de ciertos medicamentos.  Uso de ciertos jabones o productos perfumados que provocan irritacin. INSTRUCCIONES PARA EL CUIDADO EN EL HOGAR Controle su disuria para ver si hay cambios. Las siguientes indicaciones pueden ayudar a Psychologist, educational [employer] pueda sentir:  Beba suficiente lquido para Pharmacologist la orina clara o de color amarillo plido.  Vace la vejiga con frecuencia. Evite retener la orina durante largos perodos.  Despus de defecar, las mujeres deben limpiarse desde adelante hacia atrs, usando el papel higinico solo [place].  Vace la vejiga despus de Management consultant.  Tome los medicamentos solamente como se lo haya indicado el mdico.  Si le recetaron antibiticos, asegrese de terminarlos,  incluso si comienza a sentirse mejor.  Evite la cafena, el t y el alcohol. Estos productos pueden Theatre manager vejiga y Probation officer disuria. En los hombres, el alcohol puede irritar la prstata.  Concurra a todas las visitas de control como se lo haya indicado el mdico. Esto es importante.  Si le realizaron pruebas para Landscape architect causa de la disuria, es su responsabilidad retirar los [place]. Consulte en el laboratorio o en el departamento en el que fue realizado el estudio cundo y cmo podr Starbucks Corporation. Hable con el mdico si tiene [employer]ne[employer]. SOLICITE ATENCIN MDICA SI:  Siente dolor en la espalda o a los costados del cuerpo.  Tiene fiebre.  Tiene nuseas o vmitos.  Observa sangre en la orina.  Est orinando con ms frecuencia que lo habitual. SOLICITE ATENCIN MDICA DE INMEDIATO SI:  El dolor es intenso y no se alivia con los medicamentos.  No puede retener lquido.  Usted u otra persona advierten algn cambio en su funcin mental.  Tiene una frecuencia cardaca acelerada en reposo.  Tiene temblores o escalofros.  Se siente muy dbil.   Esta informacin no tiene Theme park manager el consejo del mdico. Asegrese de hacerle al mdico cualquier pregunta que tenga.   Document Released: 10/09/2007 Document Revised: 10/10/2014 Elsevier Interactive Patient Education Yahoo[employer] Inc.  Emergency Department Resource Guide 1) Find a Doctor and Pay Out of Pocket Although you won't have to find out who is covered by your insurance plan, it is a good idea to ask around and get recommendations. You will then need to call the office and see if the doctor you have chosen will accept you as a new patient and what types of options they offer for patients who are self-pay. Some doctors offer discounts or will set up payment plans for their patients who do not have insurance, but you will need to ask so you aren't surprised when you get to  your appointment.  2) Contact Your Local Health Department Not all health departments have doctors that can see patients for sick visits, but many do, so it is worth a call to see if yours does. If you don't know where your local health department is, you can check in your phone book. The CDC also has a tool to help you locate your state's health department, and many state websites also have listings of all of their local health departments.  3) Find a Walk-in Clinic If your illness is not likely to be very severe or complicated, you may want to try a walk in clinic. These are popping up all over the country in pharmacies, drugstores, and shopping centers. They're usually staffed by nurse practitioners or physician assistants that have been trained to treat common illnesses and complaints. They're usually fairly quick and inexpensive. However, if you have serious medical issues or chronic medical problems, these are probably not your best option.  No Primary Care Doctor: - Call Health Connect at  [phone] - they can help you locate a primary care doctor that  accepts your insurance, provides certain services, etc. - Physician Referral Service- [phone]  Chronic Pain Problems: Organization         Address  Phone   Notes  [facility]  [phone] Patients need to be referred by their primary care doctor.   Medication Assistance: Organization         Address  Phone   Notes  [facility] [address] [place], [place] [id number] [phone] --Must be a resident of [place] -- Must have NO insurance coverage whatsoever (no Medicaid/ Medicare, etc.) -- The pt. MUST have a primary care doctor that directs their care regularly and follows them in the community   Med[facility]ist  [phone]   [employer]  [phone]    Agencies that provide inexpensive medical care: Organization         Address  Phone    Notes  [facility]  [phone]   [facility]    [phone]) [phone]   [facility] [address] [place], [place] [address] [phone]   [facility] [address], [place] [phone]   Planned Parenthood    [phone]   [facility]    [phone]   [facility]  [address], [place] Phone:  [phone], Fax:  [phone]) [phone] Hours of Operation:  9 am - 6 pm, M-F.  Also accepts Medicaid/Medicare and self-pay.  [facility]  [address], [place] Phone: [phone]) [phone], Fax: [phone]) [phone]. Hours of Operation:  8:30 am - 5:30 pm, M-F.  Also accepts Medicaid and self-pay.  [facility] [place]  [address], [employer] Phone: [phone]   [facility] [address] Natasha Bence [place], [place] [phone], Ext. [phone] Mondays & Thursdays: 7-9 AM.  First 15 patients are seen on a first come, first serve basis.    Medicaid-accepting [facility] Providers:  Organization         Address  Phone   Notes  [facility] [address], [place] [phone] Also accepts self-pay patients.  [facility] [address] Laurell Josephs [place], [place]  [phone]   [facility] [address], [place] [phone]   [facility] [address] [place] [phone]   Renaye Rakers [address], [place]   [phone] Only accepts [place] Access [place] patients after they have their name applied to their card.   Self-Pay (no insurance) in [facility]:  Organization         Address  Phone   Notes  Sickle Cell Patients, [facility] [address] [place], [place] [phone]   [facility] [address] [place], [place] [phone]   [facility]  [address], [place] [phone]   [facility]/Dr. Osei-Bonsu  [address] [place] or [address], [place] [phone] Phone number for both [place] and [place] locations is the same.  [facility] [address], [place] [phone]   [facility] [address] [place] or [address] [phone]   [facility] [address], [place] [phone], phone; [phone], fax Sees patients 1st and 3rd Saturday of every month.  Must not qualify for public or private insurance (i.e. Medicaid, Medicare, [place] Health Choice, Veterans' Benefits)  Household income should be no more than 200% of the poverty level The clinic cannot treat you if you are pregnant or think you are pregnant  Sexually transmitted diseases are not treated at the clinic.    Dental Care: Organization         Address  Phone  Notes  [facility] Department of [facility] [address] [place], [place] [phone] Accepts children up to age 70 who are enrolled in [place] or [place] Health Choice; pregnant women with a Medicaid card; and children who have applied for Medicaid or [place] Health Choice, but were declined, whose parents can pay a reduced fee at time of service.  [facility] Department of [facility]  [address], [place] [phone] Accepts children up to age 41 who are enrolled in [place] or [place] Health Choice; pregnant women with a Medicaid card; and children who have applied for Medicaid or [place] Health Choice, but were declined, whose parents can pay a reduced fee at time of service.  [facility]  [address] [place], [place] [phone] Patients are seen by appointment only. Walk-ins are not accepted. [facility] will see patients 26 years of age  and older. Monday - Tuesday (8am-5pm) Most Wednesdays (8:30-5pm) $30 per visit, cash only  [facility] Dental Access PRO[facility]  [address], [facility] [phone] Patients are seen by appointment only. Walk-ins are not accepted. [facility] will see patients 58 years of age and older.  One Wednesday Evening (Monthly: Volunteer Based).  $30 per visit, cash only  [employer]  [phone] for adults; Children under age 41, call Graduate Pediatric Dentistry at [phone]. Children aged 31-14, please call [phone] to request a pediatric application.  Dental services are provided in all areas of dental care including fillings, crowns and bridges, complete and partial dentures, implants, gum treatment, root canals, and extractions. Preventive care is also provided. Treatment is provided to both adults and children. Patients are selected via a lottery and there is often a waiting list.   [facility] [address], [place]  [phone] [email or web address]   [facility] [address] [place], [place] [phone], Ext. [phone] Second and Fourth Thursday of each month, opens at 6:30 AM; Clinic ends at 9 AM.  Patients are seen on a first-come first-served basis, and a limited number are seen during each clinic.   [facility]  [address] Ether Griffins [place], [place] [phone]   Eligibility Requirements You must have lived in [place], [place], or [place] counties for at least the last three months.   You cannot be eligible for state or federal sponsored [place], including [employer], [place], or [employer].   You generally cannot be eligible for healthcare insurance through your employer.    How to apply: Eligibility screenings are held every Tuesday and Wednesday afternoon from 1:00 pm until 4:00 pm. You do not need an appointment for the interview!  [facility] [address] [place], [place] [phone]   [facility] Health Department  [phone]   [facility] Health Department  [phone]   [facility]  [phone]    Behavioral Health Resources in the Community: Intensive Outpatient Programs Organization         Address  Phone  Notes  [facility] Services [address] [place], [place] [phone]   [facility] Outpatient [address] [place], [place] [phone]   [facility]: [facility] [address] [place], [place]  [phone]   [facility] Health [address],  [place], [place] [phone] or [phone]   Substance Abuse Resources Organization         Address  Phone  Notes  Alcohol [facility] Drug Services  [phone]   [facility]  [phone]   [facility] [place]  [phone]   Floydene Flock  [phone]   Residential & Outpatient Substance Abuse Program  [phone]   Psychological Services Organization         Address  Phone  Notes  [facility] Behavioral Health  [phone]   [facility] Services  [phone]   [facility] Mental Health [address], [place] [phone] or [phone]    Mobile Crisis Teams Organization         Address  Phone  Notes  [facility], Mobile Crisis Care Unit  [phone]   [facility]  [address] [place], [place] [phone]   Doristine Locks [address] [place] [phone]    Self-Help/Support Groups Organization         Address  Phone             Notes  [facility] [place] - variety of support groups  [phone]- I7437963 Call for more information  Narcotics Anonymous (NA), [facility] [address], [employer] [place]  2 meetings at this location  Residential Treatment Programs Organization         Address  Phone  Notes  [facility] [address],    [place]  [phone]   [facility]  [address], [place] [address], [place], [place] [phone]   [facility] [address] [place], [place] [phone] Admissions: 8am-3pm M-F  [facility] [address],    [place], [place] [phone]   [facility] [address] [place], [place], [place] [phone]   [facility] [address]  [place], [place] [phone]   [facility] - Intensive Outpatient [address] Laurell Josephs [address], [place], [place] [phone]   [facility] ([facility] [address] [place].,  [place], [place] [phone] or [phone]   [facility] ([facility]) [address] [place], [place] [phone] Accepts Medicaid  [facility] [address],  [place] [phone] Substance Abuse/Addiction Treatment   [facility] Organization         Address  Phone  Notes  [facility]  [phone]   Angie Fava, PhD [address] Ervin Knack [place], [place]   [phone] or [phone]   [facility] Behavioral   [address] [place], [place] [phone]   [facility] [address] [place], [place] [phone] Insurance/Medicaid/sponsorship through [facility] and Families [address]                                    [place], [place] [phone] Therapy/tele-psych/case  [facility] [address] [place], [place] [phone]    Dr. Lolly Mustache  [phone]   [facility] [place]  [employer] [facility] Dept. 1) [address],  2) [address] [place] 3)  [address], [place] [phone]  [phone]   [facility] Child Abuse Hotline (780)598-1346 or [phone] (After Hours)

## 2015-10-05 NOTE — ED Provider Notes (Signed)
CSN: [id number]     Arrival date & time 10/05/15  7829 History  By signing my name below, I, Essence Howell, attest that this documentation has been prepared under the direction and in the presence of Glean Hess, PA-C Electronically Signed: Charline Bills, ED Scribe 10/05/2015 at 11:51 AM.   Chief Complaint  Patient presents with  . Dysuria    The history is provided by the patient. The history is limited by a language barrier. A language interpreter was used.    HPI Comments: [name] is a 30 y.o. female who presents to the Emergency Department complaining of constant, gradually worsening dysuria for the past 3 weeks. Pt reports associated constant right-sided back pain for the past 3 weeks, fever, chills, HA, right-sided neck pain, abdominal pain and unchanged constipation. She has tried OTC pain medications without significant relief. Pt denies nausea, vomiting, diarrhea, vaginal discharge, vaginal bleeding, urinary or bowel incontinence, dizziness, light-headedness, visual disturbances. Pt's last BM was this morning. No h/o cancer or IV drug use. Pt denies anticoagulant or antiplatelet use.  Past Medical History  Diagnosis Date  . Eating disorder   . Bulimia nervosa    History reviewed. No pertinent past surgical history. No family history on file. Social History  Substance Use Topics  . Smoking status: Never Smoker   . Smokeless tobacco: None  . Alcohol Use: No   OB History    No data available      Review of Systems  Constitutional: Positive for fever and chills.  Eyes: Negative for visual disturbance.  Gastrointestinal: Positive for abdominal pain and constipation. Negative for nausea, vomiting and diarrhea.  Genitourinary: Positive for dysuria. Negative for vaginal bleeding and vaginal discharge.  Musculoskeletal: Positive for back pain and neck pain.  Neurological: Positive for headaches. Negative for dizziness and light-headedness.    Allergies  Review  of patient's allergies indicates no known allergies.  Home Medications   Prior to Admission medications   Medication Sig Start Date End Date Taking? Authorizing Provider  ibuprofen (ADVIL,MOTRIN) 600 MG tablet Take 1 tablet (600 mg total) by mouth every 6 (six) hours as needed. 10/05/15   Mady Gemma, PA-C  pantoprazole (PROTONIX) 40 MG tablet Take 1 tablet (40 mg total) by mouth daily. 09/26/14   Alvira Monday, MD  phenazopyridine (PYRIDIUM) 95 MG tablet Take 1 tablet (95 mg total) by mouth 3 (three) times daily as needed for pain. 10/05/15   Mady Gemma, PA-C    BP 125/82 mmHg  Pulse 94  Temp(Src) 98.5 F (36.9 C)  Resp 18  SpO2 98% Physical Exam  Constitutional: She is oriented to person, place, and time. She appears well-developed and well-nourished. No distress.  HENT:  Head: Normocephalic and atraumatic.  Right Ear: External ear normal.  Left Ear: External ear normal.  Nose: Nose normal.  Mouth/Throat: Uvula is midline, oropharynx is clear and moist and mucous membranes are normal.  Eyes: Conjunctivae, EOM and lids are normal. Pupils are equal, round, and reactive to light. Right eye exhibits no discharge. Left eye exhibits no discharge. No scleral icterus.  Neck: Normal range of motion. Neck supple. Muscular tenderness present. No spinous process tenderness present.  Mild TTP of right trapezius. No midline cervical tenderness, step-off, or deformity. No nuchal rigidity.  Cardiovascular: Normal rate, regular rhythm, normal heart sounds, intact distal pulses and normal pulses.   Pulmonary/Chest: Effort normal and breath sounds normal. No respiratory distress. She has no wheezes. She has no rales.  Abdominal: Soft.  Normal appearance and bowel sounds are normal. She exhibits no distension and no mass. There is tenderness in the suprapubic area. There is CVA tenderness. There is no rigidity, no rebound and no guarding.  Mild TTP of suprapubic region. Bilateral CVA  tenderness.   Musculoskeletal: Normal range of motion. She exhibits no edema or tenderness.  Neurological: She is alert and oriented to person, place, and time. She has normal strength. No cranial nerve deficit or sensory deficit.  Skin: Skin is warm, dry and intact. No rash noted. She is not diaphoretic. No erythema. No pallor.  Psychiatric: She has a normal mood and affect. Her speech is normal and behavior is normal.  Nursing note and vitals reviewed.   ED Course  Procedures (including critical care time)  DIAGNOSTIC STUDIES: Oxygen Saturation is 98% on RA, normal by my interpretation.    COORDINATION OF CARE: 10:41 AM-Discussed treatment plan which includes UA and diagnostic lab work with pt at bedside and pt agreed to plan.   Labs Review Labs Reviewed  URINALYSIS, ROUTINE W REFLEX MICROSCOPIC (NOT [facility])  CBC WITH DIFFERENTIAL/PLATELET  BASIC METABOLIC PANEL  POC URINE PREG, ED   Imaging Review No results found.   I have personally reviewed and evaluated these images and lab results as part of my medical decision-making.   EKG Interpretation None      MDM   Final diagnoses:  Back pain, unspecified location  Dysuria    30 year old female presents with dysuria and back pain for the past 3 weeks. Reports subjective fever, chills, HA, right-sided neck pain, suprapubic abdominal pain, and constipation. Denies nausea, vomiting, diarrhea, vaginal discharge, vaginal bleeding, bowel or bladder incontinence, saddle anesthesia, history of IVDU, anticoagulant use, history of malignancy, weakness, paresthesia, dizziness, light-headedness, visual disturbance. Patient states she has a history of headaches, and that her headache feels similar. Last normal BM was this morning.   Patient is afebrile. Vital signs stable. Mild tenderness palpation of right trapezius. No midline cervical tenderness, step-off, or deformity. No nuchal rigidity. Heart regular rate and rhythm. Lungs clear to  auscultation bilaterally. Abdomen soft, nondistended, with mild tenderness palpation in suprapubic region. No rebound, guarding, or masses. Bilateral CVA tenderness. Normal neuro exam with no focal deficit.   Urine pregnancy negative. UA negative for infection. CBC negative for leukocytosis or anemia. BMP unremarkable.  On reassessment of patient, she denies recent injury, though states she lifts her 30-year-old child often. Back pain may be muscular. Patient adds she was evaluated for the same symptoms (dysuria and back pain) in November during her physical, with an unremarkable workup. No evidence of UTI. No hematuria on UA, low suspicion for nephrolithiasis. Will treat with ibuprofen and give azo for burning with urination. Patient is nontoxic and well-appearing, feel she is stable for discharge at this time. Return precautions discussed. Patient verbalizes her understanding and is in agreement with plan.  BP 109/72 mmHg  Pulse 77  Temp(Src) 98.2 F (36.8 C) (Oral)  Resp 16  SpO2 99%  I personally performed the services described in this documentation, which was scribed in my presence. The recorded information has been reviewed and is accurate.    Mady Gemmalizabeth C Lysandra Loughmiller, PA-C 10/05/15 1436  Pricilla LovelessScott Goldston, MD 10/06/15 [phone]

## 2015-10-05 NOTE — ED Notes (Signed)
Patient here with back pain and dysuria x 2 weeks, no nausea no vomiting

## 2015-10-27 ENCOUNTER — Other Ambulatory Visit: Payer: Self-pay | Admitting: Gastroenterology

## 2015-10-27 DIAGNOSIS — K5900 Constipation, unspecified: Secondary | ICD-10-CM

## 2015-10-27 DIAGNOSIS — R109 Unspecified abdominal pain: Secondary | ICD-10-CM

## 2015-11-02 ENCOUNTER — Other Ambulatory Visit: Payer: Self-pay | Admitting: Gastroenterology

## 2015-11-02 ENCOUNTER — Ambulatory Visit
Admission: RE | Admit: 2015-11-02 | Discharge: 2015-11-02 | Disposition: A | Discharge status: Home / Self Care | Payer: No Typology Code available for payment source | Source: Ambulatory Visit | Attending: Gastroenterology | Admitting: Gastroenterology

## 2015-11-02 DIAGNOSIS — R109 Unspecified abdominal pain: Secondary | ICD-10-CM

## 2015-11-02 DIAGNOSIS — K5900 Constipation, unspecified: Secondary | ICD-10-CM

## 2015-11-30 ENCOUNTER — Other Ambulatory Visit: Payer: Self-pay | Admitting: Urology

## 2015-11-30 DIAGNOSIS — N644 Mastodynia: Secondary | ICD-10-CM

## 2015-12-15 ENCOUNTER — Ambulatory Visit
Admission: RE | Admit: 2015-12-15 | Discharge: 2015-12-15 | Disposition: A | Discharge status: Home / Self Care | Payer: No Typology Code available for payment source | Source: Ambulatory Visit | Attending: Urology | Admitting: Urology

## 2015-12-15 DIAGNOSIS — N644 Mastodynia: Secondary | ICD-10-CM

## 2016-03-03 ENCOUNTER — Encounter (HOSPITAL_COMMUNITY): Payer: Self-pay | Admitting: *Deleted

## 2016-03-07 ENCOUNTER — Encounter (HOSPITAL_COMMUNITY): Payer: Self-pay | Admitting: *Deleted

## 2016-03-07 ENCOUNTER — Ambulatory Visit (HOSPITAL_COMMUNITY)
Admission: EM | Admit: 2016-03-07 | Discharge: 2016-03-07 | Disposition: A | Discharge status: Home / Self Care | Payer: No Typology Code available for payment source | Attending: Family Medicine | Admitting: Family Medicine

## 2016-03-07 DIAGNOSIS — B850 Pediculosis due to Pediculus humanus capitis: Secondary | ICD-10-CM

## 2016-03-07 DIAGNOSIS — M545 Low back pain: Secondary | ICD-10-CM

## 2016-03-07 LAB — POCT URINALYSIS DIP (DEVICE)
Bilirubin Urine: NEGATIVE
Glucose, UA: NEGATIVE mg/dL
HGB URINE DIPSTICK: NEGATIVE
Ketones, ur: NEGATIVE mg/dL
Leukocytes, UA: NEGATIVE
NITRITE: NEGATIVE
PH: 6 (ref 5.0–8.0)
PROTEIN: NEGATIVE mg/dL
Specific Gravity, Urine: <=1.005 (ref 1.005–1.030)
UROBILINOGEN UA: 0.2 mg/dL (ref 0.0–1.0)

## 2016-03-07 MED ORDER — PERMETHRIN 1 % EX LIQD: Freq: Once | CUTANEOUS | Status: DC

## 2016-03-07 NOTE — ED Notes (Signed)
Maggie in  To  Interpret        Plan of  Care  Discussed  With  Patient

## 2016-03-07 NOTE — ED Notes (Signed)
Pt  Has   Symptoms   Of  Itching         And  Burning  X  2   Weeks       Of the  Scalp     Pt   Has  Back  Pain  As   Well  As  Burning  On  Urination      Pt  denys  Any  Injury       maggie  In  To  Interpret

## 2016-03-07 NOTE — ED Provider Notes (Signed)
CSN: [id number]     Arrival date & time 03/07/16  1256 History   First MD Initiated Contact with Patient 03/07/16 1310     Chief Complaint  Patient presents with  . Head Lice   (Consider location/radiation/quality/duration/timing/severity/associated sxs/prior Treatment) HPI History obtained from patient:  Pt presents with the cc of:  Head lice, back pain Duration of symptoms: Head lice symptoms are been present for about one week. Back pain is been present for a couple of days. Treatment prior to arrival: No home treatment for head lice no home treatment for back pain Context: Patient states that approximately 2 weeks ago her daughter came home with head lice and was treated to the urgent care. She now has itchy scalp. Back pain started about 2 days ago with some mild dysuria. Other symptoms include: Itchy scalp, burning urine Pain score: 0 FAMILY HISTORY: No history of diabetes    Past Medical History  Diagnosis Date  . Eating disorder   . Bulimia   . GERD (gastroesophageal reflux disease)    Past Surgical History  Procedure Laterality Date  . No past surgeries     Family History  Problem Relation Age of Onset  . Hearing loss Neg Hx   . Cancer Neg Hx   . Diabetes Neg Hx   . Heart failure Neg Hx    Social History  Substance Use Topics  . Smoking status: Never Smoker   . Smokeless tobacco: Never Used  . Alcohol Use: No   OB History    Gravida Para Term Preterm AB TAB SAB Ectopic Multiple Living   Review of Systems  Denies: HEADACHE, NAUSEA, ABDOMINAL PAIN, CHEST PAIN, CONGESTION, DYSURIA, SHORTNESS OF BREATH  Allergies  Review of patient's allergies indicates no known allergies.  Home Medications   Prior to Admission medications   Medication Sig Start Date End Date Taking? Authorizing Provider  cyclobenzaprine (FLEXERIL) 5 MG tablet Take 1 tablet (5 mg total) by mouth 3 (three) times daily as needed for muscle spasms. Patient not taking:  Reported on 03/03/2016 06/13/15   Richardean Canal, MD  hydrocortisone (ANUSOL-HC) 25 MG suppository Place 1 suppository (25 mg total) rectally 2 (two) times daily. For 7 days Patient not taking: Reported on 03/03/2016 06/13/15   Richardean Canal, MD  ibuprofen (ADVIL,MOTRIN) 600 MG tablet Take 1 tablet (600 mg total) by mouth every 6 (six) hours as needed. Patient not taking: Reported on 03/03/2016 06/13/15   Richardean Canal, MD  omeprazole (PRILOSEC) 20 MG capsule Take 20 mg by mouth daily.    Historical Provider, MD  permethrin (NIX CREME RINSE) 1 % liquid Apply topically once. 03/07/16   Tharon Aquas, PA   Meds Ordered and Administered this Visit  Medications - No data to display  BP 117/72 mmHg  Pulse 86  Temp(Src) 98.6 F (37 C) (Oral)  Resp 18  SpO2 100% No data found.   Physical Exam NURSES NOTES AND VITAL SIGNS REVIEWED. CONSTITUTIONAL: Well developed, well nourished, no acute distress HEENT: normocephalic, atraumatic EYES: Conjunctiva normal NECK:normal ROM, supple, no adenopathy PULMONARY:No respiratory distress, normal effort ABDOMINAL: Soft, ND, NT BS+, No CVAT MUSCULOSKELETAL: Normal ROM of all extremities,  SKIN: warm and dry without rash, examination of hair does reveal white nits attached to the hair shaft, and these nits are not mobile. PSYCHIATRIC: Mood and affect, behavior are normal  ED Course  Procedures (including critical  care time)  Labs Review Labs Reviewed  POCT URINALYSIS DIP (DEVICE)    Imaging Review No results found.   Visual Acuity Review  Right Eye Distance:   Left Eye Distance:   Bilateral Distance:    Right Eye Near:   Left Eye Near:    Bilateral Near:       I expect full recovery from the head lice. Should have no long-term sequelae. She should also follow with her primary care provider. Urinalysis was done and no signs of UTI.  MDM   1. Head lice infestation   2. Low back pain, unspecified back pain laterality, with sciatica presence  unspecified     Patient is reassured that there are no issues that require transfer to higher level of care at this time or additional tests. Patient is advised to continue home symptomatic treatment. Patient is advised that if there are new or worsening symptoms to attend the emergency department, contact primary care provider, or return to UC. Instructions of care provided discharged home in stable condition.    THIS NOTE WAS GENERATED USING A VOICE RECOGNITION SOFTWARE PROGRAM. ALL REASONABLE EFFORTS  WERE MADE TO PROOFREAD THIS DOCUMENT FOR ACCURACY.  I have verbally reviewed the discharge instructions with the patient. A printed AVS was given to the patient.  All questions were answered prior to discharge.      Tharon AquasFrank C Patrick, PA 03/07/16 1513  Tharon AquasFrank C Patrick, PA 03/07/16 [phone]

## 2016-03-07 NOTE — Discharge Instructions (Signed)
Dolor de espalda en adultos °(Back Pain, Adult) °El dolor de espalda es muy frecuente en los adultos. La causa del dolor de espalda es rara vez peligrosa y el dolor a menudo mejora con el tiempo. Es posible que se desconozca la causa de esta afección. Algunas causas comunes son las siguientes: °· Distensión de los músculos o ligamentos que sostienen la columna vertebral. °· Desgaste (degeneración) de los discos vertebrales. °· Artritis. °· Lesiones directas en la espalda. °En muchas personas, el dolor de espalda es recurrente. Como rara vez es peligroso, las personas pueden aprender a manejar esta afección por sí mismas. °INSTRUCCIONES PARA EL CUIDADO EN EL HOGAR °Controle su dolor de espalda a fin de detectar algún cambio. Las siguientes indicaciones ayudarán a aliviar cualquier molestia que pueda sentir: °· Permanezca activo. Si permanece sentado o de pie en un mismo lugar durante mucho tiempo, se tensiona la espalda. No se siente, conduzca o permanezca de pie en un mismo lugar durante más de 30 minutos seguidos. Realice caminatas cortas en superficies planas tan pronto como le sea posible. Trate de caminar un poco más de tiempo cada día. °· Haga ejercicio regularmente como se lo haya indicado el médico. El ejercicio ayuda a que su espalda se cure más rápidamente. También ayuda a prevenir futuras lesiones al mantener los músculos fuertes y flexibles. °· No permanezca en la cama. Si hace reposo más de 1 a 2 días, puede demorar su recuperación. °· Preste atención a su cuerpo al inclinarse y levantarse. Las posiciones más cómodas son las que ejercen menos tensión en la espalda en recuperación. Siempre use técnicas apropiadas para levantar objetos, como por ejemplo: °· Flexionar las rodillas. °· Mantener la carga cerca del cuerpo. °· No torcerse. °· Encuentre una posición cómoda para dormir. Use un colchón firme y recuéstese de costado con las rodillas ligeramente flexionadas. Si se recuesta sobre la espalda, coloque  una almohada debajo de las rodillas. °· Evite sentir ansiedad o estrés. El estrés aumenta la tensión muscular y puede empeorar el dolor de espalda. Es importante reconocer si se siente ansioso o estresado y aprender maneras de controlarlo, por ejemplo haciendo ejercicio. °· Tome los medicamentos solamente como se lo haya indicado el médico. Los medicamentos de venta libre para aliviar el dolor y la inflamación a menudo son los más eficaces. El médico puede recetarle relajantes musculares. Estos medicamentos ayudan a calmar el dolor de modo que pueda reanudar más rápidamente sus actividades normales y el ejercicio saludable. °· Aplique hielo sobre la zona lesionada. °· Ponga el hielo en una bolsa plástica. °· Coloque una toalla entre la piel y la bolsa de hielo. °· Deje el hielo durante 20 minutos, 2 a 3 veces por día, durante los primeros 2 o 3 días. Después de eso, puede alternar el hielo y el calor para reducir el dolor y los espasmos. °· Mantenga un peso saludable. El exceso de peso ejerce presión adicional sobre la espalda y hace que resulte difícil mantener una buena postura. °SOLICITE ATENCIÓN MÉDICA SI: °· Siente un dolor que no se alivia con reposo o medicamentos. °· Siente mucho dolor que se extiende a las piernas o los glúteos. °· El dolor no mejora en una semana. °· Siente dolor por la noche. °· Pierde peso. °· Siente escalofríos o fiebre. °SOLICITE ATENCIÓN MÉDICA DE INMEDIATO SI:  °· Tiene nuevos problemas para controlar la vejiga o los intestinos. °· Siente debilidad o adormecimiento inusuales en los brazos o en las piernas. °· Siente náuseas o vómitos. °· Siente dolor abdominal. °· Siente que va a desmayarse. °  °  Esta informacin no tiene Theme park manager el consejo del mdico. Asegrese de hacerle al mdico cualquier pregunta que tenga.   Document Released: 09/19/2005 Document Revised: 10/10/2014 Elsevier Interactive Patient Education 2016 [employer]. Piojos en adultos (Lice, Adult) Los  piojos son pequeos insectos con garras en los extremos de las patas. Son pequeos parsitos que viven en el cuerpo humano. A menudo, los piojos hacen su hogar en el cabello de una persona, como el cabello de la cabeza o en el pubis. Los piojos del pubis a veces se denominan ladillas. Los piojos nacen de pequeos huevos redondos, que se pegan a la base del cabello. Los [employer] de los piojos tambin se denominan liendres. Los piojos causan irritacin de la piel y picazn en la zona del cabello infestado. Si bien tener piojos puede ser [place], no es peligroso y Scientist, product/process development no Clinical biochemist. Con el tratamiento, en general, los sntomas desaparecen en pocos das. CAUSAS Los piojos se pueden contagiar de Neomia Dear persona a Educational psychologist. Los piojos trepan. No vuelan ni saltan. Para contraer piojos:  Debe tener un contacto muy cercano con la persona infestada.  Debe compartir objetos infestados que tocan la piel o el cabello. Estos incluyen objetos personales, como gorros, peines, cepillos, toallas, ropa, almohadas o sbanas. Los piojos del pubis se contagian por contacto sexual. FACTORES DE RIESGO Si bien tener piojos es ms comn entre nios pequeos, cualquiera puede contraer piojos. Los piojos se Art gallery manager en un clima clido, de modo que ese tipo de clima [address]. SIGNOS Y SNTOMAS  Picazn en la zona afectada.  Irritacin de la piel.  Sensacin de que algo se mueve en el cabello.  Erupcin cutnea o llagas en la piel.  Pequeas escamas o sacos cerca del cuero cabelludo. Estos pueden ser de [employer], amarillo o [place].  Pequeos bichos que trepan por el cabello o el cuero cabelludo. DIAGNSTICO El diagnstico se basar en los sntomas y el examen fsico. El mdico examinar de cerca la zona afectada para determinar si hay piojos vivos, huevos pequeos (liendres) y cscaras de huevos vacas. Los [employer] por lo general son de color amarillo o tostado. Las cscaras de huevo vacas son  blancuzcas. Los piojos son grises o Music therapist. TRATAMIENTO El tratamiento contra los piojos incluye lo siguiente:  Usar un enjuague para el cabello que contenga un insecticida suave para matar piojos. El mdico recomendar un enjuague recetado o de [place].  Eliminar los piojos, los huevos y las cscaras de huevo vacas con un peine o con pincitas.  Lavar y guardar en Neomia Dear bolsa su ropa y la ropa de cama. Las mujeres embarazadas no deben usar champ o crema con medicamento sin Science writer al mdico. INSTRUCCIONES PARA EL CUIDADO EN EL HOGAR  Aplique el enjuague con medicamento como se lo haya indicado el mdico. Siga cuidadosamente las instrucciones de la [place]. Las instrucciones generales para la aplicacin de enjuagues pueden incluir estos pasos:  Colquese ropa interior o una camisa vieja, o utilice una toalla en caso de que el enjuague [place].  Lvese la cabeza o el pubis y seque con una toalla antes de aplicar el enjuague si se le indic hacerlo.  Cuando el cabello est seco, aplique el enjuague. Djese el enjuague en el cabello durante el tiempo especificado en las instrucciones.  Enjuague la zona con agua.  Peine el cabello hmedo desde cerca de la piel hacia los extremos para eliminar piojos, huevos y cscaras de [place].  No lave el cabello infestado por  2 das mientras el TEPPCO Partnersmedicamento mata los piojos.  Si es necesario, Sales promotion account executiverepita el tratamiento en 7 a [address] das.  Controle si quedan piojos, huevos o cscaras de [place]vo cada 2 o 3 [address], durante 2 semanas o segn le hayan indicado. Despus del tratamiento, los piojos restantes deberan moverse ms lento.  Elimine los piojos, los huevos o las cscaras de huevo restantes con un peine de dientes finos.  Medco Health SolutionsLave todos los gorros, [place] Hillstoallas, bufandas, [place]chaquetas, ropa de cama y ropa en agua caliente.  Coloque en bolsas de plstico durante 2 semanas los objetos que no se puedan lavar, que hayan estado expuestos.  Ponga en agua caliente durante  10 minutos todos los peines y cepillos.  Aspire los muebles para eliminar cualquier cabello suelto. No es necesario usar sustancias qumicas, que pueden ser txicas. Los piojos sobreviven solo 1 o 2 [address]  [address] fuera de la [address] humana. Los Jones Apparel Grouphuevos pueden sobrevivir solo 1 semana.  Si tiene piojos en el pubis, informe a sus parejas sexuales que busquen tratamiento.  Si tiene piojos en la cabeza, pregunte al mdico si otros familiares o personas que tengan un contacto cercano tambin deben examinarse o tratarse.  Concurra a todas las visitas de control como se lo haya indicado el mdico. Esto es importante. SOLICITE ATENCIN MDICA SI:  Tiene llagas que parecen infectadas.  La erupcin cutnea o las llagas no desaparecen en 1 semana.  Los piojos o los huevos regresan o no desaparecen a Designer, industrial/productpesar del tratamiento. ASEGRESE DE QUE:  Comprende estas instrucciones.  Controlar su afeccin.  Recibir ayuda de inmediato si no mejora o si empeora.   Esta informacin no tiene Theme park managercomo fin reemplazar el consejo del mdico. Asegrese de hacerle al mdico cualquier pregunta que tenga.   Document Released: 06/29/2005 Document Revised: 10/10/2014 Elsevier Interactive Patient Education [employer]2016 Elsevier Inc.

## 2016-03-14 ENCOUNTER — Other Ambulatory Visit: Payer: Self-pay | Admitting: Gastroenterology

## 2016-03-18 ENCOUNTER — Encounter (HOSPITAL_COMMUNITY): Payer: Self-pay | Admitting: Certified Registered Nurse Anesthetist

## 2016-03-18 ENCOUNTER — Ambulatory Visit (HOSPITAL_COMMUNITY): Payer: Self-pay | Admitting: Anesthesiology

## 2016-03-18 ENCOUNTER — Ambulatory Visit (HOSPITAL_COMMUNITY)
Admission: RE | Admit: 2016-03-18 | Discharge: 2016-03-18 | Disposition: A | Discharge status: Home / Self Care | Payer: No Typology Code available for payment source | Source: Ambulatory Visit | Attending: Gastroenterology | Admitting: Gastroenterology

## 2016-03-18 ENCOUNTER — Encounter (HOSPITAL_COMMUNITY)
Admission: RE | Disposition: A | Discharge status: Home / Self Care | Payer: Self-pay | Source: Ambulatory Visit | Attending: Gastroenterology

## 2016-03-18 DIAGNOSIS — K921 Melena: Secondary | ICD-10-CM | POA: Insufficient documentation

## 2016-03-18 DIAGNOSIS — K219 Gastro-esophageal reflux disease without esophagitis: Secondary | ICD-10-CM | POA: Insufficient documentation

## 2016-03-18 DIAGNOSIS — R1084 Generalized abdominal pain: Secondary | ICD-10-CM | POA: Insufficient documentation

## 2016-03-18 DIAGNOSIS — Z79899 Other long term (current) drug therapy: Secondary | ICD-10-CM | POA: Insufficient documentation

## 2016-03-18 DIAGNOSIS — K648 Other hemorrhoids: Secondary | ICD-10-CM | POA: Insufficient documentation

## 2016-03-18 HISTORY — DX: Gastro-esophageal reflux disease without esophagitis: K21.9

## 2016-03-18 HISTORY — PX: COLONOSCOPY WITH PROPOFOL: SHX5780

## 2016-03-18 SURGERY — COLONOSCOPY WITH PROPOFOL
Anesthesia: Monitor Anesthesia Care

## 2016-03-18 MED ORDER — LACTATED RINGERS IV SOLN
INTRAVENOUS | Status: DC | PRN
  Administered 2016-03-18: 08:00:00 via INTRAVENOUS

## 2016-03-18 MED ORDER — PROPOFOL 500 MG/50ML IV EMUL
INTRAVENOUS | Status: DC | PRN
  Administered 2016-03-18: 60 mg via INTRAVENOUS
  Administered 2016-03-18: 30 mg via INTRAVENOUS

## 2016-03-18 MED ORDER — PHENYLEPHRINE HCL 10 MG/ML IJ SOLN
INTRAMUSCULAR | Status: DC | PRN
  Administered 2016-03-18 (×2): 80 ug via INTRAVENOUS
  Administered 2016-03-18: 120 ug via INTRAVENOUS
  Administered 2016-03-18 (×2): 80 ug via INTRAVENOUS
  Administered 2016-03-18: 40 ug via INTRAVENOUS

## 2016-03-18 MED ORDER — PROPOFOL 10 MG/ML IV BOLUS
INTRAVENOUS | Status: AC
  Filled 2016-03-18: qty 40

## 2016-03-18 MED ORDER — PROPOFOL 500 MG/50ML IV EMUL
INTRAVENOUS | Status: DC | PRN
  Administered 2016-03-18: 100 ug/kg/min via INTRAVENOUS

## 2016-03-18 MED ORDER — SODIUM CHLORIDE 0.9 % IV SOLN: INTRAVENOUS | Status: DC

## 2016-03-18 SURGICAL SUPPLY — 21 items

## 2016-03-18 NOTE — Discharge Instructions (Signed)
Call if question or problem otherwise if GI problems continue it might be better to follow-up with my partner Dr. Evette CristalGanem who speaks Spanish and either he or I am  happy to see back as needed Colonoscopa: cuidados posteriores (Colonoscopy, Care After) Estas indicaciones le proporcionan informacin general acerca de cmo deber cuidarse despus del procedimiento. El mdico tambin podr darle instrucciones especficas. Comunquese con el mdico si tiene algn problema o tiene preguntas despus del procedimiento. CUIDADOS EN EL HOGAR  No conduzca durante 24horas.  No firme papeles importantes ni use maquinaria pesada durante 24horas.  Puede ducharse.  Puede retomar las actividades habituales, pero hgalo ms despacio durante las primeras 24horas.  Durante las primeras 24horas, descanse con frecuencia.  Camine o pngase compresas tibias en el vientre (abdomen) si tiene clicos intestinales o gases.  Beba suficiente lquido para mantener el pis (orina) claro o de color amarillo plido.  Retome su dieta normal. No coma comidas pesadas ni fritas.  No tome alcohol durante 24horas, o segn el mdico le indique.  Tome solo los medicamentos segn le haya indicado el mdico. Si se obtuvo una muestra de tejido (biopsia) durante el procedimiento:   No tome aspirina ni anticoagulantes durante 7das, o segn el mdico le indique.  No tome alcohol durante 7das, o segn el mdico le indique.  Consuma alimentos livianos durante las primeras 24horas. SOLICITE AYUDA SI: An hay una pequea cantidad de sangre en la materia fecal (heces) 2 o 3das despus del procedimiento. SOLICITE AYUDA DE INMEDIATO SI:  Hay ms que una pequea cantidad de Kohl'ssangre en la materia fecal.  Observa grumos de tejido (cogulos de [place]ann[place]angre) en la materia fecal.  Tiene el vientre inflamado (hinchado).  Tiene malestar estomacal (nuseas) o vomita.  Tiene fiebre.  Siente que Chief Technology Officerel dolor en el vientre empeora y no  se alivia con los medicamentos.   Esta informacin no tiene Theme park managercomo fin reemplazar el consejo del mdico. Asegrese de hacerle al mdico cualquier pregunta que tenga.   Document Released: 01/04/2011 Document Revised: 09/24/2013 Elsevier Interactive Patient Education [employer]2016 Elsevier Inc.

## 2016-03-18 NOTE — Op Note (Signed)
[facility] Patient Name: [name] Procedure Date: 03/18/2016 MRN: [id number] Attending MD: Vida Rigger , MD Date of Birth: 02/10/1986 CSN: [id number] Age: 30 Admit Type: Outpatient Procedure:                Colonoscopy Indications:              Generalized abdominal pain, Hematochezia Providers:                Vida Rigger, MD, Kandice Robinsons, Technician,                            Dwain Sarna, RN, Jacquiline Doe, RN, Mirian Mo,                            CRNA Referring MD:              Medicines:                Propofol total dose 350 mg IV Complications:            No immediate complications. Estimated Blood Loss:     Estimated blood loss: none. Procedure:                Pre-Anesthesia Assessment:                           - Prior to the procedure, a History and Physical                            was performed, and patient medications and                            allergies were reviewed. The patient's tolerance of                            previous anesthesia was also reviewed. The risks                            and benefits of the procedure and the sedation                            options and risks were discussed with the patient.                            All questions were answered, and informed consent                            was obtained. Prior Anticoagulants: The patient has                            taken no previous anticoagulant or antiplatelet                            agents. ASA Grade Assessment: I - A normal, healthy  patient. After reviewing the risks and benefits,                            the patient was deemed in satisfactory condition to                            undergo the procedure.                           After obtaining informed consent, the colonoscope                            was passed under direct vision. Throughout the                            procedure, the patient's blood pressure,  pulse, and                            oxygen saturations were monitored continuously. The                            EC-[id number] ([id number]) scope was introduced through                            the anus and advanced to the the terminal ileum.                            The terminal ileum, ileocecal valve, appendiceal                            orifice, and rectum were photographed. The                            colonoscopy was performed without difficulty. The                            patient tolerated the procedure well. The quality                            of the bowel preparation was adequate. Scope In: Scope Out: Findings:      tiny internal hemorrhoids were found during retroflexion, during       perianal exam and during digital exam. they did have some small       well-healed tears internally.      The terminal ileum appeared normal.      The exam was otherwise without abnormality. Impression:               - Internal hemorrhoids with well-healed tears.                           - The examined portion of the ileum was normal.                           - The examination was otherwise normal.                           -  No specimens collected. Moderate Sedation:      N/A- Per Anesthesia Care Recommendation:           - Patient has a contact number available for                            emergencies. The signs and symptoms of potential                            delayed complications were discussed with the                            patient. Return to normal activities tomorrow.                            Written discharge instructions were provided to the                            patient.                           - Soft diet today.                           - Continue present medications.                           - Repeat colonoscopy at age 30 for screening                            purposes.                           - Return to GI office PRN.                            - Telephone GI clinic if symptomatic PRN. Procedure Code(s):        --- Professional ---                           [phone], Colonoscopy, flexible; diagnostic, including                            collection of specimen(s) by brushing or washing,                            when performed (separate procedure) Diagnosis Code(s):        --- Professional ---                           K64.8, Other hemorrhoids                           R10.84, Generalized abdominal pain                           K92.1, Melena (includes Hematochezia) CPT copyright 2016 [employer] Medical Association. All rights  reserved. The codes documented in this report are preliminary and upon coder review may  be revised to meet current compliance requirements. Vida Rigger, MD 03/18/2016 8:20:30 AM This report has been signed electronically. Number of Addenda: 0

## 2016-03-18 NOTE — Transfer of Care (Signed)
Immediate Anesthesia Transfer of Care Note  Patient: [name]  Procedure(s) Performed: Procedure(s): COLONOSCOPY WITH PROPOFOL (N/A)  Patient Location: PACU  Anesthesia Type:MAC  Level of Consciousness: sedated, patient cooperative and responds to stimulation  Airway & Oxygen Therapy: Patient Spontanous Breathing and Patient connected to face mask oxygen  Post-op Assessment: Report given to RN and Post -op Vital signs reviewed and stable  Post vital signs: Reviewed and stable  Last Vitals:  Filed Vitals:   03/18/16 0714  Pulse: 100  Temp: 36.7 C  Resp: 19    Last Pain: There were no vitals filed for this visit.       Complications: No apparent anesthesia complications

## 2016-03-18 NOTE — Anesthesia Preprocedure Evaluation (Addendum)
Anesthesia Evaluation  Patient identified by MRN, date of birth, ID band Patient awake    Reviewed: Allergy & Precautions, H&P , NPO status , Patient's Chart, lab work & pertinent test results  Airway Mallampati: II  TM Distance: >3 FB Neck ROM: Full    Dental no notable dental hx. (+) Teeth Intact, Dental Advisory Given   Pulmonary neg pulmonary ROS,    Pulmonary exam normal breath sounds clear to auscultation       Cardiovascular negative cardio ROS   Rhythm:Regular Rate:Normal     Neuro/Psych Depression negative neurological ROS     GI/Hepatic Neg liver ROS, GERD  Medicated and Controlled,  Endo/Other  negative endocrine ROS  Renal/GU negative Renal ROS  negative genitourinary   Musculoskeletal   Abdominal   Peds  Hematology negative hematology ROS (+)   Anesthesia Other Findings   Reproductive/Obstetrics negative OB ROS                            Anesthesia Physical Anesthesia Plan  ASA: II  Anesthesia Plan: MAC   Post-op Pain Management:    Induction: Intravenous  Airway Management Planned: Simple Face Mask  Additional Equipment:   Intra-op Plan:   Post-operative Plan:   Informed Consent: I have reviewed the patients History and Physical, chart, labs and discussed the procedure including the risks, benefits and alternatives for the proposed anesthesia with the patient or authorized representative who has indicated his/her understanding and acceptance.   Dental advisory given  Plan Discussed with: CRNA  Anesthesia Plan Comments:         Anesthesia Quick Evaluation

## 2016-03-18 NOTE — Anesthesia Postprocedure Evaluation (Signed)
Anesthesia Post Note  Patient: Shon HaleRosario P Farooqui  Procedure(s) Performed: Procedure(s) (LRB): COLONOSCOPY WITH PROPOFOL (N/A)  Patient location during evaluation: PACU Anesthesia Type: MAC Level of consciousness: awake and alert Pain management: pain level controlled Vital Signs Assessment: post-procedure vital signs reviewed and stable Respiratory status: spontaneous breathing, nonlabored ventilation and respiratory function stable Cardiovascular status: stable and blood pressure returned to baseline Anesthetic complications: no    Last Vitals:  Filed Vitals:   03/18/16 0840 03/18/16 0850  BP: 106/58 102/53  Pulse: 75 79  Temp:    Resp: 21 17    Last Pain: There were no vitals filed for this visit.               Aleksander Edmiston,W. EDMOND

## 2016-03-18 NOTE — Progress Notes (Signed)
[name] 7:30 AM  Subjective: Patient without any further bleeding and no new complaints or problems since I saw her recently in the office  Objective: Vital signs stable afebrile no acute distress exam please see preassessment evaluation  Assessment: Bright red blood per rectum in patient with probable IBS  Plan: Okay to proceed with colonoscopy with anesthesia assistance today  [facility] Hospital[facility],Kadin Canipe [name]  Pager [phone] After 5PM or if no answer call [phone]

## 2016-03-21 ENCOUNTER — Encounter (HOSPITAL_COMMUNITY): Payer: Self-pay | Admitting: Gastroenterology

## 2016-05-31 ENCOUNTER — Emergency Department (HOSPITAL_COMMUNITY)
Admission: EM | Admit: 2016-05-31 | Discharge: 2016-05-31 | Disposition: A | Discharge status: Home / Self Care | Payer: Self-pay | Attending: Emergency Medicine | Admitting: Emergency Medicine

## 2016-05-31 ENCOUNTER — Encounter (HOSPITAL_COMMUNITY): Payer: Self-pay

## 2016-05-31 DIAGNOSIS — R197 Diarrhea, unspecified: Secondary | ICD-10-CM | POA: Insufficient documentation

## 2016-05-31 DIAGNOSIS — R1110 Vomiting, unspecified: Secondary | ICD-10-CM | POA: Insufficient documentation

## 2016-05-31 DIAGNOSIS — N898 Other specified noninflammatory disorders of vagina: Secondary | ICD-10-CM | POA: Insufficient documentation

## 2016-05-31 DIAGNOSIS — R300 Dysuria: Secondary | ICD-10-CM | POA: Insufficient documentation

## 2016-05-31 DIAGNOSIS — E860 Dehydration: Secondary | ICD-10-CM | POA: Insufficient documentation

## 2016-05-31 LAB — URINALYSIS, ROUTINE W REFLEX MICROSCOPIC
Bilirubin Urine: NEGATIVE
GLUCOSE, UA: NEGATIVE mg/dL
HGB URINE DIPSTICK: NEGATIVE
KETONES UR: 40 mg/dL — AB
Nitrite: NEGATIVE
PH: 6 (ref 5.0–8.0)
Protein, ur: NEGATIVE mg/dL
Specific Gravity, Urine: 1.035 — ABNORMAL HIGH (ref 1.005–1.030)

## 2016-05-31 LAB — COMPREHENSIVE METABOLIC PANEL
ALBUMIN: 4.7 g/dL (ref 3.5–5.0)
ALT: 17 U/L (ref 14–54)
AST: 23 U/L (ref 15–41)
Alkaline Phosphatase: 75 U/L (ref 38–126)
Anion gap: 8 (ref 5–15)
BUN: 13 mg/dL (ref 6–20)
CHLORIDE: 104 mmol/L (ref 101–111)
CO2: 22 mmol/L (ref 22–32)
CREATININE: 0.62 mg/dL (ref 0.44–1.00)
Calcium: 9.7 mg/dL (ref 8.9–10.3)
GFR calc Af Amer: >60 mL/min (ref >60)
Glucose, Bld: 114 mg/dL — ABNORMAL HIGH (ref 65–99)
POTASSIUM: 3.3 mmol/L — AB (ref 3.5–5.1)
SODIUM: 134 mmol/L — AB (ref 135–145)
Total Bilirubin: 0.6 mg/dL (ref 0.3–1.2)
Total Protein: 7.7 g/dL (ref 6.5–8.1)

## 2016-05-31 LAB — CBC
HEMATOCRIT: 40.1 % (ref 36.0–46.0)
Hemoglobin: 13.5 g/dL (ref 12.0–15.0)
MCH: 29.7 pg (ref 26.0–34.0)
MCHC: 33.7 g/dL (ref 30.0–36.0)
MCV: 88.3 fL (ref 78.0–100.0)
PLATELETS: 384 10*3/uL (ref 150–400)
RBC: 4.54 MIL/uL (ref 3.87–5.11)
RDW: 12.8 % (ref 11.5–15.5)
WBC: 11.1 10*3/uL — AB (ref 4.0–10.5)

## 2016-05-31 LAB — WET PREP, GENITAL
Clue Cells Wet Prep HPF POC: NONE SEEN
Sperm: NONE SEEN
Trich, Wet Prep: NONE SEEN
YEAST WET PREP: NONE SEEN

## 2016-05-31 LAB — URINE MICROSCOPIC-ADD ON

## 2016-05-31 LAB — I-STAT BETA HCG BLOOD, ED (MC, WL, AP ONLY)

## 2016-05-31 LAB — LIPASE, BLOOD: LIPASE: 34 U/L (ref 11–51)

## 2016-05-31 MED ORDER — LOPERAMIDE HCL 2 MG PO CAPS
4.0000 mg | ORAL_CAPSULE | Freq: Once | ORAL | Status: AC
  Administered 2016-05-31: 4 mg via ORAL
  Filled 2016-05-31: qty 2

## 2016-05-31 MED ORDER — ONDANSETRON 4 MG PO TBDP
4.0000 mg | ORAL_TABLET | Freq: Once | ORAL | Status: AC | PRN
  Administered 2016-05-31: 4 mg via ORAL

## 2016-05-31 MED ORDER — ONDANSETRON 4 MG PO TBDP
ORAL_TABLET | ORAL | Status: AC
  Filled 2016-05-31: qty 1

## 2016-05-31 MED ORDER — CEPHALEXIN 500 MG PO CAPS: 500.0000 mg | ORAL_CAPSULE | Freq: Three times a day (TID) | ORAL | 0 refills | Status: DC

## 2016-05-31 MED ORDER — SODIUM CHLORIDE 0.9 % IV BOLUS (SEPSIS)
1000.0000 mL | Freq: Once | INTRAVENOUS | Status: AC
  Administered 2016-05-31: 1000 mL via INTRAVENOUS

## 2016-05-31 MED ORDER — DICYCLOMINE HCL 20 MG PO TABS: 20.0000 mg | ORAL_TABLET | Freq: Two times a day (BID) | ORAL | 0 refills | Status: DC

## 2016-05-31 MED ORDER — ONDANSETRON 4 MG PO TBDP: 4.0000 mg | ORAL_TABLET | Freq: Three times a day (TID) | ORAL | 0 refills | Status: DC | PRN

## 2016-05-31 MED ORDER — DICYCLOMINE HCL 10 MG PO CAPS
10.0000 mg | ORAL_CAPSULE | Freq: Once | ORAL | Status: AC
  Administered 2016-05-31: 10 mg via ORAL
  Filled 2016-05-31: qty 1

## 2016-05-31 NOTE — ED Provider Notes (Signed)
[facility]-EMERGENCY DEPT Provider Note   CSN: [id number] Arrival date & time: 05/31/16  0004     History   Chief Complaint Chief Complaint  Patient presents with  . Abdominal Pain  . Vaginal Discharge    HPI [name] is a 30 y.o. female.  HPI  This is a 30 year old female who presents with nausea, vomiting, diarrhea, abdominal pain. She has a history of bulimia. Patient reports 2 day history of multiple episodes of nonbilious, nonbloody emesis and diarrhea. Reports midline crampy abdominal pain. Current pain is 10 out of 10. She has not taken anything for the pain. Also reports 3 weeks of dysuria. Reports right-sided back pain. No fevers. Reports vaginal discharge. Denies new sexual partners or concerns for STDs.  History obtained with the help of a Spanish interpreter.  Past Medical History:  Diagnosis Date  . Bulimia   . Eating disorder   . GERD (gastroesophageal reflux disease)     Patient Active Problem List   Diagnosis Date Noted  . Pregnancy 04/12/2014  . Atypical chest pain 05/15/2013  . Left arm pain 05/15/2013  . Starvation ketoacidosis 04/12/2013  . Syncope 04/12/2013  . Hypokalemia 04/12/2013  . Protein-calorie malnutrition, severe (HCC) 04/12/2013  . Prolonged Q-T interval on ECG 04/12/2013  . Depression 04/16/2012  . Underweight 03/20/2012  . Eating disorder 03/20/2012  . Amenorrhea, secondary 03/20/2012    Past Surgical History:  Procedure Laterality Date  . COLONOSCOPY WITH PROPOFOL N/A 03/18/2016   Procedure: COLONOSCOPY WITH PROPOFOL;  Surgeon: Vida RiggerMarc Magod, MD;  Location: [facility] ENDOSCOPY;  Service: Endoscopy;  Laterality: N/A;  . NO PAST SURGERIES      OB History    Gravida Para Term Preterm AB Living   3 3 3     3    SAB TAB Ectopic Multiple Live Births           3       Home Medications    Prior to Admission medications   Medication Sig Start Date End Date Taking? Authorizing Provider  cephALEXin (KEFLEX) 500 MG capsule Take 1 capsule  (500 mg total) by mouth 3 (three) times daily. 05/31/16   Shon Batonourtney F Daci Stubbe, MD  cyclobenzaprine (FLEXERIL) 5 MG tablet Take 1 tablet (5 mg total) by mouth 3 (three) times daily as needed for muscle spasms. Patient not taking: Reported on 03/03/2016 06/13/15   Charlynne Panderavid Hsienta Yao, MD  dicyclomine (BENTYL) 20 MG tablet Take 1 tablet (20 mg total) by mouth 2 (two) times daily. 05/31/16   Shon Batonourtney F Nathanel Tallman, MD  hydrocortisone (ANUSOL-HC) 25 MG suppository Place 1 suppository (25 mg total) rectally 2 (two) times daily. For 7 days Patient not taking: Reported on 03/03/2016 06/13/15   Charlynne Panderavid Hsienta Yao, MD  ibuprofen (ADVIL,MOTRIN) 600 MG tablet Take 1 tablet (600 mg total) by mouth every 6 (six) hours as needed. Patient not taking: Reported on 03/03/2016 06/13/15   Charlynne Panderavid Hsienta Yao, MD  ondansetron (ZOFRAN ODT) 4 MG disintegrating tablet Take 1 tablet (4 mg total) by mouth every 8 (eight) hours as needed for nausea or vomiting. 05/31/16   Shon Batonourtney F Amyah Clawson, MD  permethrin (NIX CREME RINSE) 1 % liquid Apply topically once. Patient not taking: Reported on 05/31/2016 03/07/16   Tharon AquasFrank C Patrick, PA    Family History Family History  Problem Relation Age of Onset  . Hearing loss Neg Hx   . Cancer Neg Hx   . Diabetes Neg Hx   . Heart failure Neg Hx  Social History Social History  Substance Use Topics  . Smoking status: Never Smoker  . Smokeless tobacco: Never Used  . Alcohol use No     Allergies   Review of patient's allergies indicates no known allergies.   Review of Systems Review of Systems  Constitutional: Negative for fever.  Respiratory: Negative for shortness of breath.   Cardiovascular: Negative for chest pain.  Gastrointestinal: Positive for abdominal pain, diarrhea, nausea and vomiting.  Genitourinary: Positive for dysuria and vaginal discharge.  Musculoskeletal: Positive for back pain.  All other systems reviewed and are negative.    Physical Exam Updated Vital Signs BP 126/74 (BP  Location: Right Arm)   Pulse 88   Temp 98.1 F (36.7 C) (Oral)   Resp 14   Ht 5\' 1"  (1.549 m)   Wt 139 lb (63 kg)   SpO2 100%   BMI 26.26 kg/m   Physical Exam  Constitutional: She is oriented to person, place, and time. She appears well-developed and well-nourished. No distress.  HENT:  Head: Normocephalic and atraumatic.  Cardiovascular: Normal rate, regular rhythm and normal heart sounds.   No murmur heard. Pulmonary/Chest: Effort normal and breath sounds normal. No respiratory distress. She has no wheezes.  Abdominal: Soft. Bowel sounds are normal. She exhibits no mass. There is no tenderness. There is no guarding.  Genitourinary: Vagina normal.  Genitourinary Comments: Scant vaginal discharge noted, no cervical motion or adnexal tenderness  Neurological: She is alert and oriented to person, place, and time.  Skin: Skin is warm and dry.  Psychiatric: She has a normal mood and affect.  Nursing note and vitals reviewed.    ED Treatments / Results  Labs (all labs ordered are listed, but only abnormal results are displayed) Labs Reviewed  WET PREP, GENITAL - Abnormal; Notable for the following:       Result Value   WBC, Wet Prep HPF POC MODERATE (*)    All other components within normal limits  COMPREHENSIVE METABOLIC PANEL - Abnormal; Notable for the following:    Sodium 134 (*)    Potassium 3.3 (*)    Glucose, Bld 114 (*)    All other components within normal limits  CBC - Abnormal; Notable for the following:    WBC 11.1 (*)    All other components within normal limits  URINALYSIS, ROUTINE W REFLEX MICROSCOPIC (NOT AT [facility]) - Abnormal; Notable for the following:    APPearance CLOUDY (*)    Specific Gravity, Urine 1.035 (*)    Ketones, ur 40 (*)    Leukocytes, UA TRACE (*)    All other components within normal limits  URINE MICROSCOPIC-ADD ON - Abnormal; Notable for the following:    Squamous Epithelial / LPF 6-30 (*)    Bacteria, UA FEW (*)    All other  components within normal limits  URINE CULTURE  LIPASE, BLOOD  I-STAT BETA HCG BLOOD, ED ([facility], [facility], [facility] ONLY)  GC/CHLAMYDIA PROBE AMP ([place]) NOT AT [facility]  GC/CHLAMYDIA PROBE AMP ([place]) NOT AT [facility]    EKG  EKG Interpretation None       Radiology No results found.  Procedures Procedures (including critical care time)  Medications Ordered in ED Medications  ondansetron (ZOFRAN-ODT) disintegrating tablet 4 mg (4 mg Oral Given 05/31/16 0036)  sodium chloride 0.9 % bolus 1,000 mL (0 mLs Intravenous Stopped 05/31/16 0632)  dicyclomine (BENTYL) capsule 10 mg (10 mg Oral Given 05/31/16 0501)  loperamide (IMODIUM) capsule 4 mg (4 mg Oral  Given 05/31/16 0502)     Initial Impression / Assessment and Plan / ED Course  I have reviewed the triage vital signs and the nursing notes.  Pertinent labs & imaging results that were available during my care of the patient were reviewed by me and considered in my medical decision making (see chart for details).  Clinical Course   Patient presents with nausea, vomiting, abdominal pain, diarrhea and vaginal discharge. Nontoxic. Afebrile. Nontender on exam. Is not concerned for STDs; however, given vaginal discharge, pelvic was performed and is largely reassuring. She does appear dehydrated. She was given fluids, Zofran, and Imodium. Lab work is largely reassuring. Given benign exam and reassuring lab work, will treat patient as gastroenteritis. She has no worsening symptoms she needs to be reevaluated. Urine is not frankly infected but is leukoesterase positive and rare bacteria. Urine culture pending. Given symptoms, will treat with Keflex. Also Bentyl and Zofran.  Final Clinical Impressions(s) / ED Diagnoses   Final diagnoses:  Vomiting and diarrhea  Vaginal discharge  Dysuria  Dehydration    New Prescriptions Discharge Medication List as of 05/31/2016  6:45 AM    START taking these medications   Details  cephALEXin (KEFLEX) 500 MG  capsule Take 1 capsule (500 mg total) by mouth 3 (three) times daily., Starting Tue 05/31/2016, Print    dicyclomine (BENTYL) 20 MG tablet Take 1 tablet (20 mg total) by mouth 2 (two) times daily., Starting Tue 05/31/2016, Print    ondansetron (ZOFRAN ODT) 4 MG disintegrating tablet Take 1 tablet (4 mg total) by mouth every 8 (eight) hours as needed for nausea or vomiting., Starting Tue 05/31/2016, Print         Shon Baton, MD 05/31/16 [phone]

## 2016-05-31 NOTE — ED Triage Notes (Signed)
Pt complaining of upper abdominal pain since this morning. Pt states N/V/D today. States 20 episodes of emesis and 10 episodes of diarrhea. Pt also complaining of burning when she urinates and back ache. Pt also states some yellowish vaginal discharge.

## 2016-06-01 LAB — URINE CULTURE

## 2016-06-01 LAB — GC/CHLAMYDIA PROBE AMP (~~LOC~~) NOT AT ARMC
Chlamydia: NEGATIVE
Neisseria Gonorrhea: NEGATIVE

## 2016-06-24 ENCOUNTER — Emergency Department (HOSPITAL_COMMUNITY)
Admission: EM | Admit: 2016-06-24 | Discharge: 2016-06-25 | Disposition: A | Discharge status: Home / Self Care | Payer: Self-pay | Attending: Emergency Medicine | Admitting: Emergency Medicine

## 2016-06-24 ENCOUNTER — Emergency Department (HOSPITAL_COMMUNITY): Payer: Self-pay

## 2016-06-24 ENCOUNTER — Encounter (HOSPITAL_COMMUNITY): Payer: Self-pay | Admitting: Emergency Medicine

## 2016-06-24 DIAGNOSIS — R002 Palpitations: Secondary | ICD-10-CM | POA: Insufficient documentation

## 2016-06-24 DIAGNOSIS — R109 Unspecified abdominal pain: Secondary | ICD-10-CM

## 2016-06-24 DIAGNOSIS — R1011 Right upper quadrant pain: Secondary | ICD-10-CM | POA: Insufficient documentation

## 2016-06-24 DIAGNOSIS — R300 Dysuria: Secondary | ICD-10-CM | POA: Insufficient documentation

## 2016-06-24 LAB — BASIC METABOLIC PANEL
Anion gap: 8 (ref 5–15)
BUN: 9 mg/dL (ref 6–20)
CALCIUM: 9.9 mg/dL (ref 8.9–10.3)
CO2: 24 mmol/L (ref 22–32)
CREATININE: 0.77 mg/dL (ref 0.44–1.00)
Chloride: 106 mmol/L (ref 101–111)
GFR calc Af Amer: >60 mL/min (ref >60)
GLUCOSE: 101 mg/dL — AB (ref 65–99)
Potassium: 3.8 mmol/L (ref 3.5–5.1)
SODIUM: 138 mmol/L (ref 135–145)

## 2016-06-24 LAB — I-STAT BETA HCG BLOOD, ED (MC, WL, AP ONLY): I-stat hCG, quantitative: <5 m[IU]/mL (ref <5)

## 2016-06-24 LAB — URINALYSIS, ROUTINE W REFLEX MICROSCOPIC
BILIRUBIN URINE: NEGATIVE
GLUCOSE, UA: NEGATIVE mg/dL
HGB URINE DIPSTICK: NEGATIVE
Ketones, ur: NEGATIVE mg/dL
Leukocytes, UA: NEGATIVE
Nitrite: NEGATIVE
PROTEIN: NEGATIVE mg/dL
SPECIFIC GRAVITY, URINE: 1.018 (ref 1.005–1.030)
pH: 6.5 (ref 5.0–8.0)

## 2016-06-24 LAB — CBC
HEMATOCRIT: 40.3 % (ref 36.0–46.0)
Hemoglobin: 13.6 g/dL (ref 12.0–15.0)
MCH: 30 pg (ref 26.0–34.0)
MCHC: 33.7 g/dL (ref 30.0–36.0)
MCV: 88.8 fL (ref 78.0–100.0)
PLATELETS: 366 10*3/uL (ref 150–400)
RBC: 4.54 MIL/uL (ref 3.87–5.11)
RDW: 12.4 % (ref 11.5–15.5)
WBC: 7.2 10*3/uL (ref 4.0–10.5)

## 2016-06-24 LAB — HEPATIC FUNCTION PANEL
ALK PHOS: 78 U/L (ref 38–126)
ALT: 16 U/L (ref 14–54)
AST: 19 U/L (ref 15–41)
Albumin: 4.3 g/dL (ref 3.5–5.0)
BILIRUBIN TOTAL: 0.5 mg/dL (ref 0.3–1.2)
Bilirubin, Direct: <0.1 mg/dL — ABNORMAL LOW (ref 0.1–0.5)
TOTAL PROTEIN: 7.4 g/dL (ref 6.5–8.1)

## 2016-06-24 LAB — I-STAT CG4 LACTIC ACID, ED: Lactic Acid, Venous: 0.59 mmol/L (ref 0.5–1.9)

## 2016-06-24 LAB — I-STAT TROPONIN, ED: Troponin i, poc: 0.01 ng/mL (ref 0.00–0.08)

## 2016-06-24 MED ORDER — CYCLOBENZAPRINE HCL 10 MG PO TABS: 10.0000 mg | ORAL_TABLET | Freq: Two times a day (BID) | ORAL | 0 refills | Status: DC | PRN

## 2016-06-24 MED ORDER — SODIUM CHLORIDE 0.9 % IV SOLN
Freq: Once | INTRAVENOUS | Status: AC
  Administered 2016-06-24: 22:00:00 via INTRAVENOUS

## 2016-06-24 MED ORDER — IOPAMIDOL (ISOVUE-300) INJECTION 61%
INTRAVENOUS | Status: AC
  Administered 2016-06-24: 100 mL
  Filled 2016-06-24: qty 100

## 2016-06-24 MED ORDER — IBUPROFEN 400 MG PO TABS: 400.0000 mg | ORAL_TABLET | Freq: Four times a day (QID) | ORAL | 0 refills | Status: DC | PRN

## 2016-06-24 MED ORDER — PHENAZOPYRIDINE HCL 200 MG PO TABS: 200.0000 mg | ORAL_TABLET | Freq: Three times a day (TID) | ORAL | 0 refills | Status: DC

## 2016-06-24 NOTE — ED Triage Notes (Signed)
Pt here for left sided CP and right sided back pain; pt currently being treated for kidney infection with antibiotics but feels worse

## 2016-06-24 NOTE — ED Notes (Signed)
Patient transported to X-ray 

## 2016-06-24 NOTE — Discharge Instructions (Signed)
esta noche se evalu el dolor persistente en el flanco derecho y Chief Technology Officerel dolor en el cuadrante superior derecho, su tomografa computarizada es normal. Sus laboratorios son normales. Tambin se [place] de disuria o frecuencia urinaria. La prueba de orina es normal. Se le ha dado un medicamento llamado Pyridium, esto es una vejiga, anestsico. Para ayudar con sus sntomas. Tambin le han dado una derivacin a la cardiologa para una evaluacin ms detallada de sus palpitaciones recurrentes persistentes. Se le ha dado una receta para el ibuprofeno y un relajante muscular, ya que siento que la [place] de su malestar es de Systems developernaturaleza muscular. Por favor tome esto regularmente para los prximos 7-10 das. Haga una cita con su mdico de atencin primaria para el seguimiento segn sea necesario  tonight your evaluated for persistent right flank pain and right upper quadrant pain, your CT scan is normal.  Her labs are normal.  You also were complaining of dysuria or urinary frequency.  Your urine test is normal.  You have been given a medication called Pyridium this is a bladder, anesthetic.  To help with your symptoms. You've also been given a referral to cardiology for further evaluation of your persistent recurrent palpitations. You have been given a prescription for ibuprofen and a muscle relaxer as I feel that most of your discomfort is muscular in nature.  Please take this on a regular basis for the next 7-10 days.  Make an appointment with your primary care physician for follow-up as needed

## 2016-06-24 NOTE — ED Provider Notes (Signed)
[facility]EM[facility]ENCY DEPT Provider Note   CSN: [id number] Arrival date & time: 06/24/16  1840     History   Chief Complaint Chief Complaint  Patient presents with  . Chest Pain  . Back Pain    HPI [name] is a 30 y.o. female.  This a 30 year old Hispanic female, Spanish-speaking only.  Assistants with examination, interview via [employer] interpreter services.  Patient states that she has a history of bulimia and since that time she's had intermittent episodes of tachycardia.  She's never been referred to a cardiologist for the past several weeks.  She's had more frequent episodes of rapid heart rate and subsequent left arm numbness lasting for several minutes at a time.  She also reports that she's been treated twice in the past 2 months for UTIs with antibiotics, but is still having right flank pain as well as right upper quadrant pain.  She states she finished a course of antibiotics approximately 1 week ago, but she still having frequent urination and nausea.  Denies any fever, vaginal discharge, diarrhea.  She states she's been taking Tylenol 650 mg 1-2 times a day for symptom relief.  Last dose of Tylenol was approximately 1 PM today.  She denies any history of gynecological difficulties.  No history of ovarian cyst.  She is on Depakote and has not had a menstrual cycle in 2 years.      Past Medical History:  Diagnosis Date  . Bulimia   . Eating disorder   . GERD (gastroesophageal reflux disease)     Patient Active Problem List   Diagnosis Date Noted  . Pregnancy 04/12/2014  . Atypical chest pain 05/15/2013  . Left arm pain 05/15/2013  . Starvation ketoacidosis 04/12/2013  . Syncope 04/12/2013  . Hypokalemia 04/12/2013  . Protein-calorie malnutrition, severe (HCC) 04/12/2013  . Prolonged Q-T interval on ECG 04/12/2013  . Depression 04/16/2012  . Underweight 03/20/2012  . Eating disorder 03/20/2012  . Amenorrhea, secondary 03/20/2012    Past Surgical History:    Procedure Laterality Date  . COLONOSCOPY WITH PROPOFOL N/A 03/18/2016   Procedure: COLONOSCOPY WITH PROPOFOL;  Surgeon: Vida RiggerMarc Magod, MD;  Location: [facility];  Service: Endoscopy;  Laterality: N/A;  . NO PAST SURGERIES      OB History    Gravida Para Term Preterm AB Living   3 3 3     3    SAB TAB Ectopic Multiple Live Births           3       Home Medications    Prior to Admission medications   Medication Sig Start Date End Date Taking? Authorizing Provider  cephALEXin (KEFLEX) 500 MG capsule Take 1 capsule (500 mg total) by mouth 3 (three) times daily. Patient not taking: Reported on 06/24/2016 05/31/16   Shon Batonourtney F Horton, MD  cyclobenzaprine (FLEXERIL) 10 MG tablet Take 1 tablet (10 mg total) by mouth 2 (two) times daily as needed for muscle spasms. 06/24/16   Earley FavorGail Roberta Angell, NP  dicyclomine (BENTYL) 20 MG tablet Take 1 tablet (20 mg total) by mouth 2 (two) times daily. Patient not taking: Reported on 06/24/2016 05/31/16   Shon Batonourtney F Horton, MD  hydrocortisone (ANUSOL-HC) 25 MG suppository Place 1 suppository (25 mg total) rectally 2 (two) times daily. For 7 days Patient not taking: Reported on 06/24/2016 06/13/15   Charlynne Panderavid Hsienta Yao, MD  ibuprofen (ADVIL,MOTRIN) 400 MG tablet Take 1 tablet (400 mg total) by mouth every 6 (six) hours as needed. 06/24/16  Earley Favor, NP  ondansetron (ZOFRAN ODT) 4 MG disintegrating tablet Take 1 tablet (4 mg total) by mouth every 8 (eight) hours as needed for nausea or vomiting. Patient not taking: Reported on 06/24/2016 05/31/16   Shon Baton, MD  permethrin (NIX CREME RINSE) 1 % liquid Apply topically once. Patient not taking: Reported on 06/24/2016 03/07/16   Tharon Aquas, PA  phenazopyridine (PYRIDIUM) 200 MG tablet Take 1 tablet (200 mg total) by mouth 3 (three) times daily. 06/24/16   Earley Favor, NP    Family History Family History  Problem Relation Age of Onset  . Hearing loss Neg Hx   . Cancer Neg Hx   . Diabetes Neg Hx   . Heart  failure Neg Hx     Social History Social History  Substance Use Topics  . Smoking status: Never Smoker  . Smokeless tobacco: Never Used  . Alcohol use No     Allergies   Review of patient's allergies indicates no known allergies.   Review of Systems Review of Systems  Respiratory: Negative for shortness of breath.   Gastrointestinal: Negative for abdominal pain and diarrhea.  Genitourinary: Positive for dysuria and frequency. Negative for pelvic pain, vaginal bleeding, vaginal discharge and vaginal pain.  Musculoskeletal: Positive for back pain.  Skin: Negative for rash.  All other systems reviewed and are negative.    Physical Exam Updated Vital Signs BP 103/73   Pulse 74   Temp 98.7 F (37.1 C) (Oral)   Resp 18   SpO2 99%   Physical Exam  Constitutional: She appears well-developed and well-nourished.  Eyes: Pupils are equal, round, and reactive to light.  Neck: Normal range of motion.  Cardiovascular: Normal rate.   Pulmonary/Chest: Effort normal.  Abdominal: Soft. Bowel sounds are normal. She exhibits no distension and no mass. There is tenderness in the right upper quadrant. There is CVA tenderness. There is no rebound.  Musculoskeletal: Normal range of motion.  Skin: Skin is warm.  Psychiatric: She has a normal mood and affect.  Nursing note and vitals reviewed.    ED Treatments / Results  Labs (all labs ordered are listed, but only abnormal results are displayed) Labs Reviewed  BASIC METABOLIC PANEL - Abnormal; Notable for the following:       Result Value   Glucose, Bld 101 (*)    All other components within normal limits  HEPATIC FUNCTION PANEL - Abnormal; Notable for the following:    Bilirubin, Direct <0.1 (*)    All other components within normal limits  CBC  URINALYSIS, ROUTINE W REFLEX MICROSCOPIC (NOT AT [facility])  I-STAT TROPOININ, ED  I-STAT BETA HCG BLOOD, ED ([facility], [facility], [facility] ONLY)  I-STAT CG4 LACTIC ACID, ED    EKG  EKG  Interpretation None       Radiology Dg Chest 2 View  Result Date: 06/24/2016 CLINICAL DATA:  Left chest pain and right back pain. Currently receiving antibiotics for kidney infection. EXAM: CHEST  2 VIEW COMPARISON:  06/13/2015 FINDINGS: The heart size and mediastinal contours are within normal limits. Both lungs are clear. The visualized skeletal structures are unremarkable. IMPRESSION: No active cardiopulmonary disease. Electronically Signed   By: Burman Nieves M.D.   On: 06/24/2016 22:54   Ct Abdomen Pelvis W Contrast  Result Date: 06/24/2016 CLINICAL DATA:  Acute onset of left-sided chest pain and right-sided back pain. Initial encounter. EXAM: CT ABDOMEN AND PELVIS WITH CONTRAST TECHNIQUE: Multidetector CT imaging of the abdomen and pelvis was performed  using the standard protocol following bolus administration of intravenous contrast. CONTRAST:  ISOVUE-300 IOPAMIDOL (ISOVUE-300) INJECTION 61% COMPARISON:  CT of the abdomen and pelvis from 06/08/2013 FINDINGS: Lower chest: The visualized lung bases are grossly clear. The visualized portions of the mediastinum are unremarkable. Hepatobiliary: The liver is unremarkable in appearance. The gallbladder is unremarkable in appearance. The common bile duct remains normal in caliber. Pancreas: The pancreas is within normal limits. Spleen: The spleen is unremarkable in appearance. Adrenals/Urinary Tract: The adrenal glands are unremarkable in appearance. The kidneys are within normal limits. There is no evidence of hydronephrosis. No renal or ureteral stones are identified. No perinephric stranding is seen. Stomach/Bowel: The stomach is unremarkable in appearance. The small bowel is within normal limits. The appendix is normal in caliber, without evidence of appendicitis. The colon is unremarkable in appearance. Insert Vascular/Lymphatic: The abdominal aorta is unremarkable in appearance. The inferior vena cava is grossly unremarkable. No  retroperitoneal lymphadenopathy is seen. No pelvic sidewall lymphadenopathy is identified. Reproductive: The bladder is mildly distended and within normal limits. The uterus is grossly unremarkable in appearance. The ovaries are relatively symmetric. No suspicious adnexal masses are seen. Other: No additional soft tissue abnormalities are seen. Musculoskeletal: No acute osseous abnormalities are identified. The visualized musculature is unremarkable in appearance. IMPRESSION: No acute abnormality seen within the abdomen or pelvis. Electronically Signed   By: Roanna Raider M.D.   On: 06/24/2016 23:29    Procedures Procedures (including critical care time)  Medications Ordered in ED Medications  0.9 %  sodium chloride infusion ( Intravenous New Bag/Given 06/24/16 2211)  iopamidol (ISOVUE-300) 61 % injection (100 mLs  Contrast Given 06/24/16 2240)     Initial Impression / Assessment and Plan / ED Course  I have reviewed the triage vital signs and the nursing notes.  Pertinent labs & imaging results that were available during my care of the patient were reviewed by me and considered in my medical decision making (see chart for details).  Clinical Course   Patient does not appear ill, but is quite tender in the right upper quadrant and has positive CVA tenderness.  Due to these findings and recurrent pain and returns to the emergency room and I will obtain a CT scan to rule out pyelonephritis. CT scan reviewed all within normal parameters.  Labs and urine all within normal parameters as well.  Patient has been given prescription for symptom relief including Pyridium, ibuprofen and Flexeril.  She's been given referrals to cardiology for follow-up on her assistant continued palpitations as well as follow-up with her primary care physician   Final Clinical Impressions(s) / ED Diagnoses   Final diagnoses:  Dysuria  Flank pain  Palpitations    New Prescriptions New Prescriptions    CYCLOBENZAPRINE (FLEXERIL) 10 MG TABLET    Take 1 tablet (10 mg total) by mouth 2 (two) times daily as needed for muscle spasms.   IBUPROFEN (ADVIL,MOTRIN) 400 MG TABLET    Take 1 tablet (400 mg total) by mouth every 6 (six) hours as needed.   PHENAZOPYRIDINE (PYRIDIUM) 200 MG TABLET    Take 1 tablet (200 mg total) by mouth 3 (three) times daily.     Earley Favor, NP 06/24/16 2350    Maia Plan, MD 06/26/16 [id number]

## 2016-07-05 ENCOUNTER — Encounter (HOSPITAL_COMMUNITY): Payer: Self-pay | Admitting: Emergency Medicine

## 2016-09-03 ENCOUNTER — Emergency Department (HOSPITAL_COMMUNITY)
Admission: EM | Admit: 2016-09-03 | Discharge: 2016-09-03 | Disposition: A | Discharge status: Home / Self Care | Payer: No Typology Code available for payment source | Attending: Emergency Medicine | Admitting: Emergency Medicine

## 2016-09-03 ENCOUNTER — Emergency Department (HOSPITAL_COMMUNITY): Payer: No Typology Code available for payment source

## 2016-09-03 ENCOUNTER — Encounter (HOSPITAL_COMMUNITY): Payer: Self-pay | Admitting: *Deleted

## 2016-09-03 DIAGNOSIS — Y939 Activity, unspecified: Secondary | ICD-10-CM | POA: Insufficient documentation

## 2016-09-03 DIAGNOSIS — S9032XA Contusion of left foot, initial encounter: Secondary | ICD-10-CM | POA: Insufficient documentation

## 2016-09-03 DIAGNOSIS — X501XXA Overexertion from prolonged static or awkward postures, initial encounter: Secondary | ICD-10-CM | POA: Insufficient documentation

## 2016-09-03 DIAGNOSIS — M79672 Pain in left foot: Secondary | ICD-10-CM

## 2016-09-03 DIAGNOSIS — M25572 Pain in left ankle and joints of left foot: Secondary | ICD-10-CM

## 2016-09-03 DIAGNOSIS — Y929 Unspecified place or not applicable: Secondary | ICD-10-CM | POA: Insufficient documentation

## 2016-09-03 DIAGNOSIS — Y999 Unspecified external cause status: Secondary | ICD-10-CM | POA: Insufficient documentation

## 2016-09-03 LAB — POC URINE PREG, ED: Preg Test, Ur: NEGATIVE

## 2016-09-03 MED ORDER — TRAMADOL HCL 50 MG PO TABS: 50.0000 mg | ORAL_TABLET | Freq: Four times a day (QID) | ORAL | 0 refills | Status: DC | PRN

## 2016-09-03 MED ORDER — ACETAMINOPHEN 500 MG PO TABS
500.0000 mg | ORAL_TABLET | Freq: Once | ORAL | Status: AC
  Administered 2016-09-03: 500 mg via ORAL
  Filled 2016-09-03: qty 1

## 2016-09-03 NOTE — ED Provider Notes (Signed)
[facility] Provider Note   CSN: [id number] Arrival date & time: 09/03/16  [id number]     History   Chief Complaint Chief Complaint  Patient presents with  . Fall  . Foot Pain    HPI [name] is a 30 y.o. female.  The history is provided by the patient. The history is limited by a language barrier. A language interpreter was used.  Ankle Pain   The incident occurred more than 2 days ago. The incident occurred at home (stepping off a ladder after cleaning leaves and twisted her left ankle). Injury mechanism: twisted ankle stepping off ladder. The pain is present in the left foot and left ankle. The quality of the pain is described as throbbing. The pain is at a severity of 9/10. The pain is moderate. The pain has been constant since onset. Pertinent negatives include no numbness, no inability to bear weight, no loss of motion, no muscle weakness, no loss of sensation and no tingling. Associated symptoms comments: She has been walking on the left foot. . She reports no foreign bodies present. The symptoms are aggravated by bearing weight, activity and palpation. She has tried acetaminophen (Unable to take nsaids due to gastritis) for the symptoms. The treatment provided mild relief.    Past Medical History:  Diagnosis Date  . Bulimia   . Bulimia nervosa   . Eating disorder   . GERD (gastroesophageal reflux disease)     Patient Active Problem List   Diagnosis Date Noted  . Pregnancy 04/12/2014  . Atypical chest pain 05/15/2013  . Left arm pain 05/15/2013  . Starvation ketoacidosis 04/12/2013  . Syncope 04/12/2013  . Hypokalemia 04/12/2013  . Protein-calorie malnutrition, severe (HCC) 04/12/2013  . Prolonged Q-T interval on ECG 04/12/2013  . Depression 04/16/2012  . Underweight 03/20/2012  . Eating disorder 03/20/2012  . Amenorrhea, secondary 03/20/2012    Past Surgical History:  Procedure Laterality Date  . COLONOSCOPY WITH PROPOFOL N/A 03/18/2016   Procedure: COLONOSCOPY WITH PROPOFOL;  Surgeon: Vida RiggerMarc Magod, MD;  Location: [facility];  Service: Endoscopy;  Laterality: N/A;  . NO PAST SURGERIES      OB History    Gravida Para Term Preterm AB Living   3 3 3  0 0 3   SAB TAB Ectopic Multiple Live Births   0 0 0   3       Home Medications    Prior to Admission medications   Medication Sig Start Date End Date Taking? Authorizing Provider  cephALEXin (KEFLEX) 500 MG capsule Take 1 capsule (500 mg total) by mouth 3 (three) times daily. Patient not taking: Reported on 06/24/2016 05/31/16   Shon Batonourtney F Horton, MD  cyclobenzaprine (FLEXERIL) 10 MG tablet Take 1 tablet (10 mg total) by mouth 2 (two) times daily as needed for muscle spasms. 06/24/16   Earley FavorGail Schulz, NP  dicyclomine (BENTYL) 20 MG tablet Take 1 tablet (20 mg total) by mouth 2 (two) times daily. Patient not taking: Reported on 06/24/2016 05/31/16   Shon Batonourtney F Horton, MD  hydrocortisone (ANUSOL-HC) 25 MG suppository Place 1 suppository (25 mg total) rectally 2 (two) times daily. For 7 days Patient not taking: Reported on 06/24/2016 06/13/15   Charlynne Panderavid Hsienta Yao, MD  ibuprofen (ADVIL,MOTRIN) 400 MG tablet Take 1 tablet (400 mg total) by mouth every 6 (six) hours as needed. 06/24/16   Earley FavorGail Schulz, NP  ibuprofen (ADVIL,MOTRIN) 600 MG tablet Take 1 tablet (600 mg total) by mouth every 6 (six) hours as needed.  10/05/15   Mady GemmaElizabeth C Westfall, PA-C  ondansetron (ZOFRAN ODT) 4 MG disintegrating tablet Take 1 tablet (4 mg total) by mouth every 8 (eight) hours as needed for nausea or vomiting. Patient not taking: Reported on 06/24/2016 05/31/16   Shon Batonourtney F Horton, MD  pantoprazole (PROTONIX) 40 MG tablet Take 1 tablet (40 mg total) by mouth daily. 09/26/14   Alvira MondayErin Schlossman, MD  permethrin (NIX CREME RINSE) 1 % liquid Apply topically once. Patient not taking: Reported on 06/24/2016 03/07/16   Tharon AquasFrank C Patrick, PA  phenazopyridine (PYRIDIUM) 200 MG tablet Take 1 tablet (200 mg total) by mouth 3 (three)  times daily. 06/24/16   Earley FavorGail Schulz, NP  phenazopyridine (PYRIDIUM) 95 MG tablet Take 1 tablet (95 mg total) by mouth 3 (three) times daily as needed for pain. 10/05/15   Mady GemmaElizabeth C Westfall, PA-C    Family History Family History  Problem Relation Age of Onset  . Hearing loss Neg Hx   . Cancer Neg Hx   . Diabetes Neg Hx   . Heart failure Neg Hx     Social History Social History  Substance Use Topics  . Smoking status: Never Smoker  . Smokeless tobacco: Never Used  . Alcohol use No     Allergies   Patient has no known allergies.   Review of Systems Review of Systems  Constitutional: Negative for chills and fever.  Musculoskeletal: Positive for arthralgias, gait problem and joint swelling.  Skin: Negative for wound.  Neurological: Negative for tingling, weakness and numbness.     Physical Exam Updated Vital Signs BP 109/64 (BP Location: Left Arm)   Pulse 81   Temp 98 F (36.7 C) (Oral)   Resp 18   SpO2 94%   Physical Exam  Constitutional: She appears well-developed and well-nourished. No distress.  Eyes: Right eye exhibits no discharge. Left eye exhibits no discharge. No scleral icterus.  Pulmonary/Chest: No respiratory distress.  Musculoskeletal: Normal range of motion.       Left ankle: She exhibits swelling (minimal over the lateral malleolus) and ecchymosis (over the dorsum mid foot]). She exhibits normal range of motion (Full ROM), no deformity, no laceration and normal pulse (Dp pulses are 2+ bilaterally). Tenderness. Lateral malleolus, AITFL and head of 5th metatarsal tenderness found. No medial malleolus and no proximal fibula tenderness found.  Patient able to ambulate. Sensation in tact. Cap refill normal.   Neurological: She is alert.  Skin: Skin is warm and dry. Capillary refill takes less than 2 seconds. No pallor.  Nursing note and vitals reviewed.    ED Treatments / Results  Labs (all labs ordered are listed, but only abnormal results are  displayed) Labs Reviewed  POC URINE PREG, ED    EKG  EKG Interpretation None       Radiology Dg Ankle Complete Left  Result Date: 09/03/2016 CLINICAL DATA:  Fall at home 8 days ago. Pain along the lateral ankle and foot. EXAM: LEFT ANKLE COMPLETE - 3+ VIEW COMPARISON:  Foot 09/03/2016 FINDINGS: There is no evidence of fracture, dislocation, or joint effusion. There is no evidence of arthropathy or other focal bone abnormality. Soft tissues are unremarkable. IMPRESSION: Negative. Electronically Signed   By: Richarda OverlieAdam  Henn M.D.   On: 09/03/2016 09:06   Dg Foot Complete Left  Result Date: 09/03/2016 CLINICAL DATA:  30 year old female with history of trauma from a fall at home 8 days ago complaining of pain along the lateral side of the left foot and ankle. EXAM: LEFT  FOOT - COMPLETE 3+ VIEW COMPARISON:  None. FINDINGS: There is no evidence of fracture or dislocation. There is no evidence of arthropathy or other focal bone abnormality. Soft tissues are unremarkable. IMPRESSION: Negative. Electronically Signed   By: Trudie Reed M.D.   On: 09/03/2016 09:07    Procedures Procedures (including critical care time)  Medications Ordered in ED Medications - No data to display   Initial Impression / Assessment and Plan / ED Course  I have reviewed the triage vital signs and the nursing notes.  Pertinent labs & imaging results that were available during my care of the patient were reviewed by me and considered in my medical decision making (see chart for details).  Clinical Course   Patient X-Ray negative for obvious fracture or dislocation. Pain managed in ED. Pt advised to follow up with orthopedics if symptoms persist for possibility of missed fracture diagnosis. Patient given brace while in ED along with crutches, conservative therapy recommended and discussed including RICE. Patient will be dc home. Pt is hemodynamically stable, in NAD, & able to ambulate in the ED. Pain has been managed &  has no complaints prior to dc. Pt is comfortable with above plan and is stable for discharge at this time. All questions were answered prior to disposition. Strict return precautions for f/u to the ED were discussed.    Final Clinical Impressions(s) / ED Diagnoses   Final diagnoses:  Acute left ankle pain  Left foot pain    New Prescriptions New Prescriptions   TRAMADOL (ULTRAM) 50 MG TABLET    Take 1 tablet (50 mg total) by mouth every 6 (six) hours as needed.     Rise Mu, PA-C 09/03/16 1031    Laurence Spates, MD 09/04/16 [phone]

## 2016-09-03 NOTE — ED Notes (Signed)
Ambulated Pt to restroom. Independent and tolerated well. Pt walked with slight limp

## 2016-09-03 NOTE — Progress Notes (Signed)
Orthopedic Tech Progress Note Patient Details:  [name] 05/10/1986 [id number]  Ortho Devices Type of Ortho Device: ASO, Crutches Ortho Device/Splint Location: Applied Lace Up Ankle Brace for pts left Ankle. Pt given crutches with instructions.  Pt did not understand english however daughter helped translate. Pt did well with crutch ambulation.  Ortho Device/Splint Interventions: Application, Adjustment   Alvina ChouWilliams, Bently Morath C 09/03/2016, 12:39 PM

## 2016-09-03 NOTE — ED Notes (Signed)
Ortho tech notified that pt needs crutches.

## 2016-09-03 NOTE — Discharge Instructions (Signed)
Please rest, ice, elevate your left leg. You may take the pain medicine as needed. Also take Tylenol. Wear the ankle brace as needed for comfort. Use the crutches as needed for ambulation. I give you a referral to the orthopedist if your symptoms do not improve in the next 5-6 days. You may also will follow up with her primary care doctor. Return to the ED if your symptoms worsen.

## 2016-09-03 NOTE — ED Triage Notes (Addendum)
Used interpretor: Pt reports falling over a week ago and still has left foot and ankle pain. Ambulatory on arrival.

## 2016-09-13 ENCOUNTER — Encounter (HOSPITAL_COMMUNITY): Payer: Self-pay | Admitting: Emergency Medicine

## 2016-09-13 ENCOUNTER — Ambulatory Visit (HOSPITAL_COMMUNITY)
Admission: EM | Admit: 2016-09-13 | Discharge: 2016-09-13 | Disposition: A | Discharge status: Home / Self Care | Payer: No Typology Code available for payment source | Attending: Emergency Medicine | Admitting: Emergency Medicine

## 2016-09-13 DIAGNOSIS — L219 Seborrheic dermatitis, unspecified: Secondary | ICD-10-CM

## 2016-09-13 MED ORDER — KETOCONAZOLE 2 % EX SHAM: 1.0000 "application " | MEDICATED_SHAMPOO | CUTANEOUS | 0 refills | Status: DC

## 2016-09-13 NOTE — ED Provider Notes (Signed)
[facility]    CSN: [id number] Arrival date & time: 09/13/16  1033     History   Chief Complaint Chief Complaint  Patient presents with  . Pruritis    HPI [name] is a 30 y.o. female.   HPI  She is a 30 year old woman here for evaluation of scalp itchiness. This is been going on for about 6 months now. She has been treated with permethrin cream without improvement. She reports feeling bumps and white patches on her scalp. Sometimes the bumps drained pus-like fluid. The patches will flake off. There is some associated hair loss. The most concerning thing for her is the intense and persistent itching sensation. No history of recent ringworm infection in the family. No pets in the home.  History was obtained with a Spanish interpreter.  Past Medical History:  Diagnosis Date  . Bulimia   . Bulimia nervosa   . Eating disorder   . GERD (gastroesophageal reflux disease)     Patient Active Problem List   Diagnosis Date Noted  . Pregnancy 04/12/2014  . Atypical chest pain 05/15/2013  . Left arm pain 05/15/2013  . Starvation ketoacidosis 04/12/2013  . Syncope 04/12/2013  . Hypokalemia 04/12/2013  . Protein-calorie malnutrition, severe (HCC) 04/12/2013  . Prolonged Q-T interval on ECG 04/12/2013  . Depression 04/16/2012  . Underweight 03/20/2012  . Eating disorder 03/20/2012  . Amenorrhea, secondary 03/20/2012    Past Surgical History:  Procedure Laterality Date  . COLONOSCOPY WITH PROPOFOL N/A 03/18/2016   Procedure: COLONOSCOPY WITH PROPOFOL;  Surgeon: Vida RiggerMarc Magod, MD;  Location: [facility];  Service: Endoscopy;  Laterality: N/A;  . NO PAST SURGERIES      OB History    Gravida Para Term Preterm AB Living   3 3 3  0 0 3   SAB TAB Ectopic Multiple Live Births   0 0 0   3       Home Medications    Prior to Admission medications   Medication Sig Start Date End Date Taking? Authorizing Provider  omeprazole (PRILOSEC) 20 MG capsule Take 20 mg  by mouth daily.   Yes Historical Provider, MD  ketoconazole (NIZORAL) 2 % shampoo Apply 1 application topically 2 (two) times a week. 2-3 times a week 09/15/16   Charm RingsErin J Etoy Mcdonnell, MD  traMADol (ULTRAM) 50 MG tablet Take 1 tablet (50 mg total) by mouth every 6 (six) hours as needed. 09/03/16   Rise MuKenneth T Leaphart, PA-C    Family History Family History  Problem Relation Age of Onset  . Hearing loss Neg Hx   . Cancer Neg Hx   . Diabetes Neg Hx   . Heart failure Neg Hx     Social History Social History  Substance Use Topics  . Smoking status: Never Smoker  . Smokeless tobacco: Never Used  . Alcohol use No     Allergies   Patient has no known allergies.   Review of Systems Review of Systems As in history of present illness  Physical Exam Triage Vital Signs ED Triage Vitals [09/13/16 1111]  Enc Vitals Group     BP 106/88     Pulse Rate 95     Resp 20     Temp 98.3 F (36.8 C)     Temp Source Oral     SpO2 98 %     Weight      Height      Head Circumference      Peak Flow  Pain Score      Pain Loc      Pain Edu?      Excl. in GC?    No data found.   Updated Vital Signs BP 106/88 (BP Location: Left Arm)   Pulse 95   Temp 98.3 F (36.8 C) (Oral)   Resp 20   SpO2 98%   Visual Acuity Right Eye Distance:   Left Eye Distance:   Bilateral Distance:    Right Eye Near:   Left Eye Near:    Bilateral Near:     Physical Exam  Constitutional: She is oriented to person, place, and time. She appears well-developed and well-nourished. No distress.  Cardiovascular: Normal rate.   Pulmonary/Chest: Effort normal.  Neurological: She is alert and oriented to person, place, and time.  Skin:  Scalp with some mild flaking. No focal lesion consistent with kerion or tinea capitis.     UC Treatments / Results  Labs (all labs ordered are listed, but only abnormal results are displayed) Labs Reviewed - No data to display  EKG  EKG Interpretation None        Radiology No results found.  Procedures Procedures (including critical care time)  Medications Ordered in UC Medications - No data to display   Initial Impression / Assessment and Plan / UC Course  I have reviewed the triage vital signs and the nursing notes.  Pertinent labs & imaging results that were available during my care of the patient were reviewed by me and considered in my medical decision making (see chart for details).  Clinical Course     I'm unable to appreciate any lesion on the scalp consistent with tinea capitis or kerion that would require oral antifungals. We'll treat with ketoconazole shampoo for separate dermatitis. Discussed that this can take 2 weeks to become effective. Patient is quite concerned that she needs an oral medication. She has been doing research on the Internet and is concerned that she has tinea capitis. I recommended follow-up with a dermatologist if not improving in 2 weeks.  Final Clinical Impressions(s) / UC Diagnoses   Final diagnoses:  Seborrheic dermatitis    New Prescriptions New Prescriptions   KETOCONAZOLE (NIZORAL) 2 % SHAMPOO    Apply 1 application topically 2 (two) times a week. 2-3 times a week     Charm RingsErin J Tejon Gracie, MD 09/13/16 [phone]

## 2016-09-13 NOTE — Discharge Instructions (Signed)
This is something called seborrheic dermatitis. Use the ketoconazole shampoo 2-3 times a week. You should see improvement in 2 weeks. If this is not improving after 2 weeks, please see the dermatologist Dr. Terri PiedraLupton. With the prescription card, this should be about $15.

## 2016-09-13 NOTE — ED Triage Notes (Signed)
Second time coming to ucc for itching and bumps on scalp.  Treatment provided last time did not help.  Symptoms for 6 months.  Patient has seen pcp 3 times and has received medicines to calm allergies.  No treatment has made a difference.  Reports hair is falling out

## 2016-12-21 ENCOUNTER — Encounter: Payer: Self-pay | Admitting: Family Medicine

## 2016-12-21 ENCOUNTER — Ambulatory Visit: Payer: Self-pay | Attending: Family Medicine | Admitting: Family Medicine

## 2016-12-21 VITALS — BP 107/68 | HR 79 | Temp 98.1°F | Ht 63.5 in | Wt 129.2 lb

## 2016-12-21 DIAGNOSIS — N3000 Acute cystitis without hematuria: Secondary | ICD-10-CM | POA: Insufficient documentation

## 2016-12-21 DIAGNOSIS — K219 Gastro-esophageal reflux disease without esophagitis: Secondary | ICD-10-CM | POA: Insufficient documentation

## 2016-12-21 DIAGNOSIS — M545 Low back pain: Secondary | ICD-10-CM | POA: Insufficient documentation

## 2016-12-21 DIAGNOSIS — R109 Unspecified abdominal pain: Secondary | ICD-10-CM | POA: Insufficient documentation

## 2016-12-21 DIAGNOSIS — F502 Bulimia nervosa: Secondary | ICD-10-CM | POA: Insufficient documentation

## 2016-12-21 LAB — COMPLETE METABOLIC PANEL WITH GFR
ALBUMIN: 4.5 g/dL (ref 3.6–5.1)
ALK PHOS: 77 U/L (ref 33–115)
ALT: 16 U/L (ref 6–29)
AST: 15 U/L (ref 10–30)
BUN: 10 mg/dL (ref 7–25)
CALCIUM: 9.3 mg/dL (ref 8.6–10.2)
CHLORIDE: 107 mmol/L (ref 98–110)
CO2: 25 mmol/L (ref 20–31)
Creat: 0.71 mg/dL (ref 0.50–1.10)
Glucose, Bld: 85 mg/dL (ref 65–99)
POTASSIUM: 4.1 mmol/L (ref 3.5–5.3)
Sodium: 139 mmol/L (ref 135–146)
Total Bilirubin: 0.3 mg/dL (ref 0.2–1.2)
Total Protein: 7.2 g/dL (ref 6.1–8.1)

## 2016-12-21 LAB — POCT URINALYSIS DIPSTICK
Bilirubin, UA: NEGATIVE
Glucose, UA: NEGATIVE
NITRITE UA: NEGATIVE
PH UA: 6 (ref 5.0–8.0)
PROTEIN UA: NEGATIVE
RBC UA: NEGATIVE
Spec Grav, UA: 1.02 (ref 1.030–1.035)
UROBILINOGEN UA: 0.2 (ref ?–2.0)

## 2016-12-21 LAB — TSH: TSH: 2.63 m[IU]/L

## 2016-12-21 MED ORDER — CITALOPRAM HYDROBROMIDE 20 MG PO TABS: 20.0000 mg | ORAL_TABLET | Freq: Every day | ORAL | 3 refills | Status: DC

## 2016-12-21 MED ORDER — CIPROFLOXACIN HCL 500 MG PO TABS: 500.0000 mg | ORAL_TABLET | Freq: Two times a day (BID) | ORAL | 0 refills | Status: DC

## 2016-12-21 NOTE — Progress Notes (Signed)
Subjective:  Patient ID: [name], female    DOB: 01/30/1986  Age: 31 y.o. MRN: [id number]  CC: Back Pain (lower right side) and Weight Loss (up and down- "suddenly")   HPI [name] presents To establish care. Medical history is significant for bulimia nervosa since 2011 which developed postpartum. The patient informs me today she is concerned about fluctuations in her weight between 10 and 20 pounds which could occur over a month and denies any eating disorder but on further questioning especially with the documentation in her chart she admits to this. She informs me she eats small frequent portions to "avoid the anxiety of throwing up after eating". She currently sees a therapist but states her medications were discontinued because she did not need them. She is satisfied with her weight but would not like to lose or gain anymore weight; denies intentionally throwing up or gagging.  Also complains of right flank pain for the last 1 week which does not radiate; she has associated urinary frequency but no dysuria, abdominal pain or fever  Past Medical History:  Diagnosis Date  . Bulimia   . Bulimia nervosa   . Eating disorder   . GERD (gastroesophageal reflux disease)     Past Surgical History:  Procedure Laterality Date  . COLONOSCOPY WITH PROPOFOL N/A 03/18/2016   Procedure: COLONOSCOPY WITH PROPOFOL;  Surgeon: Vida Rigger, MD;  Location: [facility];  Service: Endoscopy;  Laterality: N/A;  . NO PAST SURGERIES      No Known Allergies   Outpatient Medications Prior to Visit  Medication Sig Dispense Refill  . omeprazole (PRILOSEC) 20 MG capsule Take 20 mg by mouth daily.    Marland Kitchen ketoconazole (NIZORAL) 2 % shampoo Apply 1 application topically 2 (two) times a week. 2-3 times a week (Patient not taking: Reported on 12/21/2016) 120 mL 0  . traMADol (ULTRAM) 50 MG tablet Take 1 tablet (50 mg total) by mouth every 6 (six) hours as needed. (Patient not taking: Reported on  12/21/2016) 6 tablet 0   No facility-administered medications prior to visit.     ROS Review of Systems  Constitutional: Negative for activity change, appetite change and fatigue.  HENT: Negative for congestion, sinus pressure and sore throat.   Eyes: Negative for visual disturbance.  Respiratory: Negative for cough, chest tightness, shortness of breath and wheezing.   Cardiovascular: Negative for chest pain and palpitations.  Gastrointestinal: Negative for abdominal distention, abdominal pain and constipation.  Endocrine: Negative for polydipsia.  Genitourinary: Positive for flank pain (Right flank pain) and frequency. Negative for dysuria.  Musculoskeletal: Negative for arthralgias and back pain.  Skin: Negative for rash.  Neurological: Negative for tremors, light-headedness and numbness.  Hematological: Does not bruise/bleed easily.  Psychiatric/Behavioral: Negative for agitation and behavioral problems.    Objective:  BP 107/68 (BP Location: Right Arm, Patient Position: Sitting, Cuff Size: Small)   Pulse 79   Temp 98.1 F (36.7 C) (Oral)   Ht 5' 3.5" (1.613 m)   Wt 129 lb 3.2 oz (58.6 kg)   SpO2 99%   BMI 22.53 kg/m   BP/Weight 12/21/2016 09/13/2016 09/03/2016  Systolic BP 107 106 110  Diastolic BP 68 88 77  Wt. (Lbs) 129.2 - -  BMI 22.53 - -      Physical Exam  Constitutional: She is oriented to person, place, and time. She appears well-developed and well-nourished.  Cardiovascular: Normal rate, normal heart sounds and intact distal pulses.   No murmur heard. Pulmonary/Chest:  Effort normal and breath sounds normal. She has no wheezes. She has no rales. She exhibits no tenderness.  Abdominal: Soft. Bowel sounds are normal. She exhibits no distension and no mass. There is no tenderness.  Musculoskeletal: Normal range of motion. She exhibits no tenderness (no CVA tenderness).  Neurological: She is alert and oriented to person, place, and time.  Skin: Skin is warm and  dry.  Psychiatric: She has a normal mood and affect.     Assessment & Plan:   1. Acute cystitis without hematuria Could explain right flank pain - Urinalysis Dipstick - ciprofloxacin (CIPRO) 500 MG tablet; Take 1 tablet (500 mg total) by mouth 2 (two) times daily.  Dispense: 6 tablet; Refill: 0  2. Bulimia nervosa Patient currently undergoing cognitive behavioral therapy We'll commence SSRI - COMPLETE METABOLIC PANEL WITH GFR - TSH - citalopram (CELEXA) 20 MG tablet; Take 1 tablet (20 mg total) by mouth daily.  Dispense: 30 tablet; Refill: 3   Meds ordered this encounter  Medications  . DISCONTD: citalopram (CELEXA) 20 MG tablet    Sig: Take 1 tablet (20 mg total) by mouth daily.    Dispense:  30 tablet    Refill:  3  . citalopram (CELEXA) 20 MG tablet    Sig: Take 1 tablet (20 mg total) by mouth daily.    Dispense:  30 tablet    Refill:  3  . ciprofloxacin (CIPRO) 500 MG tablet    Sig: Take 1 tablet (500 mg total) by mouth 2 (two) times daily.    Dispense:  6 tablet    Refill:  0    Follow-up: Return in about 1 month (around 01/21/2017) for Follow-up: Fluctuating weight.   Jaclyn ShaggyEnobong Amao MD

## 2017-01-02 ENCOUNTER — Telehealth: Payer: Self-pay | Admitting: *Deleted

## 2017-01-02 ENCOUNTER — Telehealth: Payer: Self-pay

## 2017-01-02 NOTE — Telephone Encounter (Signed)
-----   Message from Enobong Amao, MD sent at 12/22/2016  8:13 AM EDT ----- Please inform the patient that labs are normal. Thank you. 

## 2017-01-02 NOTE — Telephone Encounter (Signed)
Writer called patient through [employer].  Spoke with patient's husband who will update patient on her lab results.

## 2017-01-02 NOTE — Telephone Encounter (Signed)
Pt. Called stating she received a call from [facility] clinic. Pt. Thinks it's regarding her lab results. Please f/u with pt.

## 2017-01-25 ENCOUNTER — Ambulatory Visit: Payer: Self-pay | Attending: Family Medicine

## 2017-01-25 ENCOUNTER — Ambulatory Visit: Payer: Self-pay | Admitting: Family Medicine

## 2017-02-03 ENCOUNTER — Ambulatory Visit: Payer: Self-pay

## 2017-02-22 ENCOUNTER — Ambulatory Visit: Payer: Self-pay | Attending: Family Medicine | Admitting: Physician Assistant

## 2017-02-22 VITALS — BP 116/72 | HR 84 | Temp 98.1°F | Wt 124.6 lb

## 2017-02-22 DIAGNOSIS — R300 Dysuria: Secondary | ICD-10-CM | POA: Insufficient documentation

## 2017-02-22 DIAGNOSIS — L309 Dermatitis, unspecified: Secondary | ICD-10-CM | POA: Insufficient documentation

## 2017-02-22 DIAGNOSIS — M5442 Lumbago with sciatica, left side: Secondary | ICD-10-CM | POA: Insufficient documentation

## 2017-02-22 DIAGNOSIS — F502 Bulimia nervosa: Secondary | ICD-10-CM | POA: Insufficient documentation

## 2017-02-22 DIAGNOSIS — K219 Gastro-esophageal reflux disease without esophagitis: Secondary | ICD-10-CM | POA: Insufficient documentation

## 2017-02-22 DIAGNOSIS — M5441 Lumbago with sciatica, right side: Secondary | ICD-10-CM | POA: Insufficient documentation

## 2017-02-22 DIAGNOSIS — M545 Low back pain: Secondary | ICD-10-CM

## 2017-02-22 LAB — POCT URINALYSIS DIPSTICK
Bilirubin, UA: NEGATIVE
Glucose, UA: NEGATIVE
Ketones, UA: NEGATIVE
Nitrite, UA: NEGATIVE
PROTEIN UA: NEGATIVE
RBC UA: NEGATIVE
Urobilinogen, UA: 0.2 E.U./dL
pH, UA: 6.5 (ref 5.0–8.0)

## 2017-02-22 MED ORDER — IBUPROFEN 600 MG PO TABS: 600.0000 mg | ORAL_TABLET | Freq: Three times a day (TID) | ORAL | 0 refills | Status: DC | PRN

## 2017-02-22 MED ORDER — HYDROCORTISONE 2.5 % EX CREA: TOPICAL_CREAM | Freq: Two times a day (BID) | CUTANEOUS | 5 refills | Status: DC

## 2017-02-22 MED ORDER — NITROFURANTOIN MONOHYD MACRO 100 MG PO CAPS: 100.0000 mg | ORAL_CAPSULE | Freq: Two times a day (BID) | ORAL | 0 refills | Status: DC

## 2017-02-22 MED ORDER — FLUCONAZOLE 150 MG PO TABS: 150.0000 mg | ORAL_TABLET | Freq: Once | ORAL | 0 refills | Status: AC

## 2017-02-22 MED ORDER — CETIRIZINE HCL 10 MG PO TABS: 10.0000 mg | ORAL_TABLET | Freq: Every day | ORAL | 11 refills | Status: DC

## 2017-02-22 MED FILL — NITROFURANTOIN MONO-MCR 100: 100 | 5 days supply | Qty: 10 | Fill #0

## 2017-02-22 MED FILL — FLUCONAZOLE 150 MG TABLET: 150 | 1 days supply | Qty: 1 | Fill #0

## 2017-02-22 MED FILL — HYDROCORTISONE 2.5% CREAM: 2.5 | 15 days supply | Qty: 30 | Fill #0

## 2017-02-22 NOTE — Patient Instructions (Signed)
Eczema (Eczema) El eczema, tambin llamada dermatitis atpica, es una afeccin de la piel que causa inflamacin de la misma. Este trastorno produce una erupcin roja y sequedad y escamas en la piel. Hay gran picazn. El eczema generalmente empeora durante los meses fros del invierno y generalmente desaparece o mejora con el tiempo clido del verano. El eczema generalmente comienza a manifestarse en la infancia. Algunos nios desarrollan este trastorno y ste puede prolongarse en la Estate manager/land agentadultez. CAUSAS La causa exacta no se conoce pero parece ser una afeccin hereditaria. Generalmente las personas que sufren eczema tienen una historia familiar de eczema, alergias, asma o fiebre de heno. Esta enfermedad no es contagiosa. Algunas causas de los brotes pueden ser:  Contacto con alguna cosa a la que es sensible o Best boyalrgico.  Librarian, academicstrs. SIGNOS Y SNTOMAS  Piel seca y escamosa.  Erupcin roja y que pica.  Picazn. Esta puede ocurrir antes de que aparezca la erupcin y puede ser muy intensa. DIAGNSTICO El diagnstico de eczema se realiza basndose en los sntomas y en la historia clnica. TRATAMIENTO El eczema no puede curarse, pero los sntomas generalmente pueden controlarse con tratamiento y Development worker, communityotras estrategias. Un plan de tratamiento puede incluir:  Control de la picazn y el rascado.  Utilice antihistamnicos de venta libre segn las indicaciones, para Associate Professoraliviar la picazn. Es especialmente til por las noches cuando la picazn tiende a Theme park managerempeorar.  Utilice medicamentos de venta libre para la picazn, segn las indicaciones del mdico.  Evite rascarse. El rascado hace que la picazn empeore. Tambin puede producir una infeccin en la piel (imptigo) debido a las lesiones en la piel causadas por el rascado.  Mantenga la piel bien humectada con cremas, todos [place] das. La piel quedar hmeda y ayudar a prevenir la sequedad. Las lociones que contengan alcohol y agua deben evitarse debido a que pueden  Best boysecar la piel.  Limite la exposicin a las cosas a las que es sensible o alrgico (alrgenos).  Reconozca las situaciones que puedan causar estrs.  Desarrolle un plan para controlar el estrs. INSTRUCCIONES PARA EL CUIDADO EN EL HOGAR  Tome slo medicamentos de venta libre o recetados, segn las indicaciones del mdico.  No aplique nada sobre la piel sin Science writerconsultar a su mdico.  Deber tomar baos o duchas de corta duracin (5 minutos) en agua tibia (no caliente). Use jabones suaves para el bao. No deben tener perfume. Puede agregar aceite de bao no perfumado al agua del bao. Es Manufacturing engineermejor evitar el jabn y el bao de espuma.  Inmediatamente despus del bao o de la ducha, cuando la piel aun est hmeda, aplique una crema humectante en todo el cuerpo. Este ungento debe ser en base a vaselina. La piel quedar hmeda y ayudar a prevenir la sequedad. Cuanto ms espeso sea el ungento, mejor. No deben tener perfume.  Mantenga las uas cortas. Es posible que los nios con eczema necesiten usar guantes o mitones por la noche, despus de aplicarse el ungento.  Vista al McGraw-Hillnio con ropa de algodn o Chief of Staffmezcla de algodn. Vstalo con ropas ligeras ya que el calor aumenta la picazn.  Un nio con eczema debe permanecer alejado de personas que tengan ampollas febriles o llagas del resfro. El virus que causa las ampollas febriles (herpes simple) puede ocasionar una infeccin grave en la piel de los nios que padecen eczema. SOLICITE ATENCIN MDICA SI:  La picazn le impide dormir.  La erupcin empeora o no mejora dentro de la semana en la que se inicia el Palm Coasttratamiento.  Observa pus o costras amarillas en la zona de la erupcin.  Tiene fiebre.  Aparece un brote despus de haber estado en contacto con alguna persona que tiene ampollas febriles. Esta informacin no tiene Theme park managercomo fin reemplazar el consejo del mdico. Asegrese de hacerle al mdico cualquier pregunta que tenga. Document Released:  09/19/2005 Document Revised: 07/10/2013 Document Reviewed: 04/22/2013 Elsevier Interactive Patient Education  2017 [employer]

## 2017-02-22 NOTE — Progress Notes (Signed)
Patient ID: [name], female   DOB: 07/14/1986, 31 y.o.   MRN: [id number]    [name], is a 31 y.o. female  WUJ:[id number]  [id number]  DOB - 11-16-1985  Subjective:  Chief Complaint and HPI: [name] is a 31 y.o. female here today for 5 day h/o dysuria and LBP.  No f/c.  No N/V/D. Occasional dyspareunia X 3 months.  Occasional yellowish vaginal d/c X 3 months.  On OCP.   Also having trouble with eczema on hands and chest.  [employer] interpreters "Marvin"used.  Bulimia is stable.  She sees a Warden/ranger regularly.    ROS:   Constitutional:  No f/c, No night sweats, No unexplained weight loss. EENT:  No vision changes, No blurry vision, No hearing changes. No mouth, throat, or ear problems.  Respiratory: No cough, No SOB Cardiac: No CP, no palpitations GI:  No abd pain, No N/V/D. GU: +Urinary s/sx Musculoskeletal: No joint pain Neuro: No headache, no dizziness, no motor weakness.  Skin: + rash Endocrine:  No polydipsia. No polyuria.  Psych: Denies SI/HI  No problems updated.  ALLERGIES: No Known Allergies  PAST MEDICAL HISTORY: Past Medical History:  Diagnosis Date  . Bulimia   . Bulimia nervosa   . Eating disorder   . GERD (gastroesophageal reflux disease)     MEDICATIONS AT HOME: Prior to Admission medications   Medication Sig Start Date End Date Taking? Authorizing Provider  citalopram (CELEXA) 20 MG tablet Take 1 tablet (20 mg total) by mouth daily. Patient not taking: Reported on 02/22/2017 12/21/16   Jaclyn Shaggy, MD  ibuprofen (ADVIL,MOTRIN) 600 MG tablet Take 1 tablet (600 mg total) by mouth every 8 (eight) hours as needed. 02/22/17   Anders Simmonds, PA-C  nitrofurantoin, macrocrystal-monohydrate, (MACROBID) 100 MG capsule Take 1 capsule (100 mg total) by mouth 2 (two) times daily. 02/22/17   Anders Simmonds, PA-C     Objective:  EXAM:   Vitals:   02/22/17 1112  BP: 116/72  Pulse: 84  Temp: 98.1 F (36.7 C)  TempSrc: Oral    SpO2: 98%  Weight: 124 lb 9.6 oz (56.5 kg)    General appearance : A&OX3. NAD. Non-toxic-appearing HEENT: Atraumatic and Normocephalic.  PERRLA. EOM intact.  TM clear B. Mouth-MMM, post pharynx WNL w/o erythema, No PND. Neck: supple, no JVD. No cervical lymphadenopathy. No thyromegaly Chest/Lungs:  Breathing-non-labored, Good air entry bilaterally, breath sounds normal without rales, rhonchi, or wheezing  CVS: S1 S2 regular, no murmurs, gallops, rubs  No CVA TTP Extremities: Bilateral Lower Ext shows no edema, both legs are warm to touch with = pulse throughout Neurology:  CN II-XII grossly intact, Non focal.   Psych:  TP linear. J/I WNL. Normal speech. Appropriate eye contact and affect.  Skin:  dyshydrotic eczema B hands, and small patches of eczema on chest/torso  Data Review No results found for: HGBA1C   Assessment & Plan   1. Dysuria/?UTI - POCT urinalysis dipstick - Urine culture - Urine cytology ancillary only - nitrofurantoin, macrocrystal-monohydrate, (MACROBID) 100 MG capsule; Take 1 capsule (100 mg total) by mouth 2 (two) times daily.  Dispense: 10 capsule; Refill: 0 Urinary hygiene discussed  2. Acute bilateral low back pain, with sciatica presence unspecified - ibuprofen (ADVIL,MOTRIN) 600 MG tablet; Take 1 tablet (600 mg total) by mouth every 8 (eight) hours as needed.  Dispense: 30 tablet; Refill: 0  3.  Eczema-Rx sent  4. Bulimia-stable.  Continue f/up with psychologist.     Patient  have been counseled extensively about nutrition and exercise  Return if symptoms worsen or fail to improve, for Dr. Venetia NightAmao.  The patient was given clear instructions to go to ER or return to medical center if symptoms don't improve, worsen or new problems develop. The patient verbalized understanding. The patient was told to call to get lab results if they haven't heard anything in the next week.     Georgian CoAngela Kaeleb Emond, PA-C [facility]  [place], [place] [phone]   02/22/2017, 11:22 AM

## 2017-02-23 LAB — URINE CYTOLOGY ANCILLARY ONLY
CHLAMYDIA, DNA PROBE: NEGATIVE
Neisseria Gonorrhea: NEGATIVE
Trichomonas: NEGATIVE

## 2017-02-24 LAB — URINE CULTURE

## 2017-03-01 LAB — URINE CYTOLOGY ANCILLARY ONLY: BACTERIAL VAGINITIS: NEGATIVE

## 2017-03-06 ENCOUNTER — Telehealth: Payer: Self-pay | Admitting: Family Medicine

## 2017-03-06 NOTE — Telephone Encounter (Signed)
Pt. Called stating that on her last OV she brought clotrimazole that was prescribed by another doctor and she asked Marylene Landngela for a new Rx due to the bumps she gets on her hands. Pt. States that the medication helps her. The Rx was not prescribed to the pt. And she states that she is getting the bumps again. Pt. Also stated that she has eczema and she was prescribed an Rx but it is not helping her. Please f/u with pt.

## 2017-03-07 NOTE — Telephone Encounter (Signed)
Patient called requesting status of medication, pt was informed of our turn around period but states she is having itchiness . Please f/up

## 2017-03-14 NOTE — Telephone Encounter (Signed)
PT called again, she still waiting for someone to call her since the med is not working for her, she want another med or be refer to a dermatology, please follow up

## 2017-03-22 ENCOUNTER — Emergency Department (HOSPITAL_COMMUNITY)
Admission: EM | Admit: 2017-03-22 | Discharge: 2017-03-23 | Disposition: A | Discharge status: Home / Self Care | Payer: Self-pay | Attending: Emergency Medicine | Admitting: Emergency Medicine

## 2017-03-22 ENCOUNTER — Encounter (HOSPITAL_COMMUNITY): Payer: Self-pay | Admitting: Emergency Medicine

## 2017-03-22 ENCOUNTER — Telehealth: Payer: Self-pay | Admitting: Family Medicine

## 2017-03-22 DIAGNOSIS — R102 Pelvic and perineal pain: Secondary | ICD-10-CM | POA: Insufficient documentation

## 2017-03-22 DIAGNOSIS — Z79899 Other long term (current) drug therapy: Secondary | ICD-10-CM | POA: Insufficient documentation

## 2017-03-22 DIAGNOSIS — B350 Tinea barbae and tinea capitis: Secondary | ICD-10-CM | POA: Insufficient documentation

## 2017-03-22 LAB — COMPREHENSIVE METABOLIC PANEL
ALBUMIN: 4 g/dL (ref 3.5–5.0)
ALT: 10 U/L — ABNORMAL LOW (ref 14–54)
ANION GAP: 7 (ref 5–15)
AST: 17 U/L (ref 15–41)
Alkaline Phosphatase: 70 U/L (ref 38–126)
BILIRUBIN TOTAL: 0.4 mg/dL (ref 0.3–1.2)
BUN: 5 mg/dL — ABNORMAL LOW (ref 6–20)
CHLORIDE: 104 mmol/L (ref 101–111)
CO2: 24 mmol/L (ref 22–32)
Calcium: 9.3 mg/dL (ref 8.9–10.3)
Creatinine, Ser: 0.65 mg/dL (ref 0.44–1.00)
GFR calc Af Amer: >60 mL/min (ref >60)
Glucose, Bld: 92 mg/dL (ref 65–99)
POTASSIUM: 3.2 mmol/L — AB (ref 3.5–5.1)
Sodium: 135 mmol/L (ref 135–145)
TOTAL PROTEIN: 6.6 g/dL (ref 6.5–8.1)

## 2017-03-22 LAB — URINALYSIS, ROUTINE W REFLEX MICROSCOPIC
Bilirubin Urine: NEGATIVE
Glucose, UA: NEGATIVE mg/dL
Hgb urine dipstick: NEGATIVE
KETONES UR: NEGATIVE mg/dL
LEUKOCYTES UA: NEGATIVE
NITRITE: NEGATIVE
PH: 7 (ref 5.0–8.0)
Protein, ur: NEGATIVE mg/dL
SPECIFIC GRAVITY, URINE: 1.003 — AB (ref 1.005–1.030)

## 2017-03-22 LAB — CBC
HEMATOCRIT: 39.8 % (ref 36.0–46.0)
HEMOGLOBIN: 13.8 g/dL (ref 12.0–15.0)
MCH: 30.4 pg (ref 26.0–34.0)
MCHC: 34.7 g/dL (ref 30.0–36.0)
MCV: 87.7 fL (ref 78.0–100.0)
Platelets: 384 10*3/uL (ref 150–400)
RBC: 4.54 MIL/uL (ref 3.87–5.11)
RDW: 12.2 % (ref 11.5–15.5)
WBC: 9.7 10*3/uL (ref 4.0–10.5)

## 2017-03-22 LAB — LIPASE, BLOOD: LIPASE: 45 U/L (ref 11–51)

## 2017-03-22 NOTE — Telephone Encounter (Signed)
Patient calling requesting a dermatology  Referral because the medication is not working [employer] You .

## 2017-03-22 NOTE — ED Triage Notes (Signed)
Pt reports lower abdominal pain since last period. LMP 03/03/17. States pain so severe she is unable to stand it anymore. Pt reports vaginal discharge as well. Denies n/v/d, reports pain with urinating and difficulty urinating.

## 2017-03-23 ENCOUNTER — Emergency Department (HOSPITAL_COMMUNITY): Payer: Self-pay

## 2017-03-23 LAB — WET PREP, GENITAL
Sperm: NONE SEEN
Trich, Wet Prep: NONE SEEN
YEAST WET PREP: NONE SEEN

## 2017-03-23 LAB — GC/CHLAMYDIA PROBE AMP (~~LOC~~) NOT AT ARMC
CHLAMYDIA, DNA PROBE: NEGATIVE
NEISSERIA GONORRHEA: NEGATIVE

## 2017-03-23 LAB — PREGNANCY, URINE: Preg Test, Ur: NEGATIVE

## 2017-03-23 MED ORDER — CEFTRIAXONE SODIUM 250 MG IJ SOLR
250.0000 mg | Freq: Once | INTRAMUSCULAR | Status: AC
  Administered 2017-03-23: 250 mg via INTRAMUSCULAR
  Filled 2017-03-23: qty 250

## 2017-03-23 MED ORDER — LIDOCAINE HCL (PF) 1 % IJ SOLN
5.0000 mL | Freq: Once | INTRAMUSCULAR | Status: AC
  Administered 2017-03-23: 5 mL
  Filled 2017-03-23: qty 5

## 2017-03-23 MED ORDER — AZITHROMYCIN 250 MG PO TABS
1000.0000 mg | ORAL_TABLET | Freq: Once | ORAL | Status: AC
  Administered 2017-03-23: 1000 mg via ORAL
  Filled 2017-03-23: qty 4

## 2017-03-23 MED ORDER — METRONIDAZOLE 500 MG PO TABS
2000.0000 mg | ORAL_TABLET | Freq: Once | ORAL | Status: AC
Start: 2017-03-23 — End: 2017-03-23
  Administered 2017-03-23: 2000 mg via ORAL
  Filled 2017-03-23: qty 4

## 2017-03-23 MED ORDER — TERBINAFINE HCL 250 MG PO TABS: 250.0000 mg | ORAL_TABLET | Freq: Every day | ORAL | 0 refills | Status: AC

## 2017-03-23 NOTE — ED Provider Notes (Signed)
[facility] DEPT Provider Note   CSN: [id number] Arrival date & time: 03/22/17  2015     History   Chief Complaint Chief Complaint  Patient presents with  . Abdominal Pain    HPI [name] is a 31 y.o. female with history of bulimia nervosa presents to the ED with low back pain that radiates to the lower abdomen to upper bilateral legs associated with dark vaginal discharge that started after last menstrual cycle.  LMP 6/1-6/8. Patient also reports many months of dyspareunia with scant yellow vaginal discharge prior to this. She is currently on birth control pills and has been compliant, intermittent condom use. Is sexually active with one partner (husband).   Patient also reports an itchy rash in central chest and upper abdomen, inner thighs and scalp for many weeks. She was initially treated with ketoconazole shampoo for rash in body by dermatologist but later developed rash to scalp. No change in topical agents or lotions, new medications or exposure to sick contacts with similar rash.   HPI  Past Medical History:  Diagnosis Date  . Bulimia   . Bulimia nervosa   . Eating disorder   . GERD (gastroesophageal reflux disease)     Patient Active Problem List   Diagnosis Date Noted  . Pregnancy 04/12/2014  . Atypical chest pain 05/15/2013  . Left arm pain 05/15/2013  . Starvation ketoacidosis 04/12/2013  . Syncope 04/12/2013  . Hypokalemia 04/12/2013  . Protein-calorie malnutrition, severe (HCC) 04/12/2013  . Prolonged Q-T interval on ECG 04/12/2013  . Depression 04/16/2012  . Underweight 03/20/2012  . Bulimia nervosa 03/20/2012  . Amenorrhea, secondary 03/20/2012    Past Surgical History:  Procedure Laterality Date  . COLONOSCOPY WITH PROPOFOL N/A 03/18/2016   Procedure: COLONOSCOPY WITH PROPOFOL;  Surgeon: Vida Rigger, MD;  Location: [facility];  Service: Endoscopy;  Laterality: N/A;  . NO PAST SURGERIES      OB History    Gravida Para Term Preterm  AB Living   3 3 3  0 0 3   SAB TAB Ectopic Multiple Live Births   0 0 0   3       Home Medications    Prior to Admission medications   Medication Sig Start Date End Date Taking? Authorizing Provider  cetirizine (ZYRTEC) 10 MG tablet Take 1 tablet (10 mg total) by mouth daily. 02/22/17   Anders Simmonds, PA-C  citalopram (CELEXA) 20 MG tablet Take 1 tablet (20 mg total) by mouth daily. Patient not taking: Reported on 02/22/2017 12/21/16   Jaclyn Shaggy, MD  hydrocortisone 2.5 % cream Apply topically 2 (two) times daily. 02/22/17   Anders Simmonds, PA-C  ibuprofen (ADVIL,MOTRIN) 600 MG tablet Take 1 tablet (600 mg total) by mouth every 8 (eight) hours as needed. 02/22/17   Anders Simmonds, PA-C  nitrofurantoin, macrocrystal-monohydrate, (MACROBID) 100 MG capsule Take 1 capsule (100 mg total) by mouth 2 (two) times daily. 02/22/17   Anders Simmonds, PA-C  terbinafine (LAMISIL) 250 MG tablet Take 1 tablet (250 mg total) by mouth daily. 03/23/17 05/04/17  Liberty Handy, PA-C    Family History Family History  Problem Relation Age of Onset  . Hearing loss Neg Hx   . Cancer Neg Hx   . Diabetes Neg Hx   . Heart failure Neg Hx     Social History Social History  Substance Use Topics  . Smoking status: Never Smoker  . Smokeless tobacco: Never Used  . Alcohol use No  Allergies   Patient has no known allergies.   Review of Systems Review of Systems  Constitutional: Negative for fever.  HENT: Negative for sore throat.   Eyes: Negative for visual disturbance.  Respiratory: Negative for cough, chest tightness and shortness of breath.   Cardiovascular: Negative for chest pain and palpitations.  Gastrointestinal: Positive for abdominal pain. Negative for constipation, diarrhea, nausea and vomiting.  Genitourinary: Positive for dyspareunia, pelvic pain, vaginal discharge and vaginal pain. Negative for dysuria, frequency and hematuria.  Musculoskeletal: Positive for back pain.  Negative for arthralgias and joint swelling.  Skin: Positive for rash. Negative for color change.  Allergic/Immunologic: Negative for immunocompromised state.  Neurological: Negative for light-headedness and headaches.     Physical Exam Updated Vital Signs BP 108/79   Pulse 95   Temp 98.7 F (37.1 C) (Oral)   Resp 19   LMP 03/03/2017   SpO2 100%   Physical Exam  Constitutional: She is oriented to person, place, and time. She appears well-developed and well-nourished. No distress.  HENT:  Head: Normocephalic and atraumatic.  Nose: Nose normal.  Mouth/Throat: Oropharynx is clear and moist. No oropharyngeal exudate.  Moist mucous membranes. Oropharynx and tonsils normal. No intraoral or oral lesions  Eyes: Conjunctivae and EOM are normal. Pupils are equal, round, and reactive to light.  Neck: Normal range of motion. Neck supple.  Cardiovascular: Normal rate, regular rhythm, normal heart sounds and intact distal pulses.   No murmur heard. Pulmonary/Chest: Effort normal and breath sounds normal. No respiratory distress. She has no wheezes. She has no rales. She exhibits no tenderness.  Abdominal: Soft. Bowel sounds are normal. She exhibits no distension and no mass. There is tenderness. There is no rebound and no guarding.  Diffuse lower abdominal tenderness most significant at suprapubic L CVAT Negative Murphy's. Negative McBurney's  Genitourinary:  Genitourinary Comments:  External genitalia normal without erythema, edema, tenderness, discharge or lesions.  No groin lymphadenopathy.  Vaginal mucosa normal, pink without lesions.  Uterus in midline, smooth, not enlarged or tender. Non palpable adnexa, non tender adnexa +Small amount of thick clear/white discharge over cervix +Cervical motion tenderness present +Patient reported discomfort with entire pelvic exam and speculum insertion  Musculoskeletal: Normal range of motion. She exhibits no deformity.  Lymphadenopathy:    She  has no cervical adenopathy.  Neurological: She is alert and oriented to person, place, and time. No sensory deficit.  Skin: Skin is warm and dry. Capillary refill takes less than 2 seconds. Rash noted.  Annular, erythematous, scaling patch that spreads centrifugally with central clearing, some patches with raised borders in central chest, upper abdomen and upper thighs. No surrounding fluctuance, streaking or cellulitis, non tender.  Psychiatric: She has a normal mood and affect. Her behavior is normal. Judgment and thought content normal.  Nursing note and vitals reviewed.    ED Treatments / Results  Labs (all labs ordered are listed, but only abnormal results are displayed) Labs Reviewed  WET PREP, GENITAL - Abnormal; Notable for the following:       Result Value   Clue Cells Wet Prep HPF POC PRESENT (*)    WBC, Wet Prep HPF POC FEW (*)    All other components within normal limits  COMPREHENSIVE METABOLIC PANEL - Abnormal; Notable for the following:    Potassium 3.2 (*)    BUN 5 (*)    ALT 10 (*)    All other components within normal limits  URINALYSIS, ROUTINE W REFLEX MICROSCOPIC - Abnormal; Notable for  the following:    Color, Urine STRAW (*)    Specific Gravity, Urine 1.003 (*)    All other components within normal limits  LIPASE, BLOOD  CBC  PREGNANCY, URINE  GC/CHLAMYDIA PROBE AMP ([place]) [facility]    EKG  EKG Interpretation None       Radiology [place] Transvaginal Non-ob  Result Date: 03/23/2017 CLINICAL DATA:  Lower abdominal pain.  Vaginal discharge. EXAM: TRANSABDOMINAL AND TRANSVAGINAL ULTRASOUND OF PELVIS DOPPLER ULTRASOUND OF OVARIES TECHNIQUE: Both transabdominal and transvaginal ultrasound examinations of the pelvis were performed. Transabdominal technique was performed for global imaging of the pelvis including uterus, ovaries, adnexal regions, and pelvic cul-de-sac. It was necessary to proceed with endovaginal exam following the transabdominal exam to  visualize the uterus, endometrium, ovaries and adnexa. Color and duplex Doppler ultrasound was utilized to evaluate blood flow to the ovaries. COMPARISON:  Pelvic ultrasound 07/03/2014 FINDINGS: Uterus Measurements: 7.2 x 3.8 x 4.8 cm. No fibroids or other mass visualized. Endometrium Thickness: 6 mm.  No focal abnormality visualized. Right ovary Measurements: 4.3 x 2.1 x 3.7 cm. There is a 2.5 x 1.5 x 2.4 cm simple cyst. Normal blood flow. No adnexal mass. Left ovary Measurements: 3.2 x 1.6 x 2.4 cm. Normal appearance with physiologic follicles. Normal blood flow. No adnexal mass. Pulsed Doppler evaluation of both ovaries demonstrates normal low-resistance arterial and venous waveforms. Other findings Small volume free fluid in the pelvis is physiologic. IMPRESSION: 1. No ovarian torsion. 2. Right ovarian cyst measuring 2.5 cm. No dedicated imaging follow-up is needed. 3. Normal sonographic appearance of the left ovary, uterus and endometrium. Electronically Signed   By: Rubye OaksMelanie  Ehinger M.D.   On: 03/23/2017 02:10   [place] Pelvis Complete  Result Date: 03/23/2017 CLINICAL DATA:  Lower abdominal pain.  Vaginal discharge. EXAM: TRANSABDOMINAL AND TRANSVAGINAL ULTRASOUND OF PELVIS DOPPLER ULTRASOUND OF OVARIES TECHNIQUE: Both transabdominal and transvaginal ultrasound examinations of the pelvis were performed. Transabdominal technique was performed for global imaging of the pelvis including uterus, ovaries, adnexal regions, and pelvic cul-de-sac. It was necessary to proceed with endovaginal exam following the transabdominal exam to visualize the uterus, endometrium, ovaries and adnexa. Color and duplex Doppler ultrasound was utilized to evaluate blood flow to the ovaries. COMPARISON:  Pelvic ultrasound 07/03/2014 FINDINGS: Uterus Measurements: 7.2 x 3.8 x 4.8 cm. No fibroids or other mass visualized. Endometrium Thickness: 6 mm.  No focal abnormality visualized. Right ovary Measurements: 4.3 x 2.1 x 3.7 cm. There is  a 2.5 x 1.5 x 2.4 cm simple cyst. Normal blood flow. No adnexal mass. Left ovary Measurements: 3.2 x 1.6 x 2.4 cm. Normal appearance with physiologic follicles. Normal blood flow. No adnexal mass. Pulsed Doppler evaluation of both ovaries demonstrates normal low-resistance arterial and venous waveforms. Other findings Small volume free fluid in the pelvis is physiologic. IMPRESSION: 1. No ovarian torsion. 2. Right ovarian cyst measuring 2.5 cm. No dedicated imaging follow-up is needed. 3. Normal sonographic appearance of the left ovary, uterus and endometrium. Electronically Signed   By: Rubye OaksMelanie  Ehinger M.D.   On: 03/23/2017 02:10   [place] Art/ven Flow Abd Pelv Doppler  Result Date: 03/23/2017 CLINICAL DATA:  Lower abdominal pain.  Vaginal discharge. EXAM: TRANSABDOMINAL AND TRANSVAGINAL ULTRASOUND OF PELVIS DOPPLER ULTRASOUND OF OVARIES TECHNIQUE: Both transabdominal and transvaginal ultrasound examinations of the pelvis were performed. Transabdominal technique was performed for global imaging of the pelvis including uterus, ovaries, adnexal regions, and pelvic cul-de-sac. It was necessary to proceed with endovaginal exam following the  transabdominal exam to visualize the uterus, endometrium, ovaries and adnexa. Color and duplex Doppler ultrasound was utilized to evaluate blood flow to the ovaries. COMPARISON:  Pelvic ultrasound 07/03/2014 FINDINGS: Uterus Measurements: 7.2 x 3.8 x 4.8 cm. No fibroids or other mass visualized. Endometrium Thickness: 6 mm.  No focal abnormality visualized. Right ovary Measurements: 4.3 x 2.1 x 3.7 cm. There is a 2.5 x 1.5 x 2.4 cm simple cyst. Normal blood flow. No adnexal mass. Left ovary Measurements: 3.2 x 1.6 x 2.4 cm. Normal appearance with physiologic follicles. Normal blood flow. No adnexal mass. Pulsed Doppler evaluation of both ovaries demonstrates normal low-resistance arterial and venous waveforms. Other findings Small volume free fluid in the pelvis is physiologic.  IMPRESSION: 1. No ovarian torsion. 2. Right ovarian cyst measuring 2.5 cm. No dedicated imaging follow-up is needed. 3. Normal sonographic appearance of the left ovary, uterus and endometrium. Electronically Signed   By: Rubye Oaks M.D.   On: 03/23/2017 02:10    Procedures Procedures (including critical care time)  Medications Ordered in ED Medications  cefTRIAXone (ROCEPHIN) injection 250 mg (250 mg Intramuscular Given 03/23/17 0443)  lidocaine (PF) (XYLOCAINE) 1 % injection 5 mL (5 mLs Other Given 03/23/17 0444)  metroNIDAZOLE (FLAGYL) tablet 2,000 mg (2,000 mg Oral Given 03/23/17 0511)  azithromycin (ZITHROMAX) tablet 1,000 mg (1,000 mg Oral Given 03/23/17 0511)     Initial Impression / Assessment and Plan / ED Course  I have reviewed the triage vital signs and the nursing notes.  Pertinent labs & imaging results that were available during my care of the patient were reviewed by me and considered in my medical decision making (see chart for details).  Clinical Course as of Mar 23 1700  Thu Mar 23, 2017  0034 Potassium: (!) 3.2 [CG]  0425 Clue Cells Wet Prep HPF POC: (!) PRESENT [CG]    Clinical Course User Index [CG] Liberty Handy, PA-C   31 yo female w/ pmh significant for bulimia nervosa presents to ED with many weeks of dyspareunia and more recent low back pain and pelvic pain with scant dark vaginal discharge. She also reports itchy rash.   On exam rash most c/w tinea corporis, lesions also found on posterior scalp. Will treat for tinea capitis.  She was advised to f/u with PCP w/in one week for terbinafine and rash follow up.  In regards to pelvic pain and vaginal discharge, clinical picture and physical exam not consistent with diverticulitis, SBO or other GI etiology.  Low suspicion for torsion or ectopic. Doubt pyelo. Patient had diffuse discomfort with pelvic exam, speculum insertion and adnexal and cervical motion.  Exam consistent with dyspareunia.  Hcg negative.   Clue cells on wet prep. U/A without evidence of infection. GC/C pending.  Ultrasound with tiny cyst but otherwise reassuring. Patient doesn't have risk factors for PID and exam is not consistent with this either.  She requested STD treatment today pending GC/C results, given.  Reassured patient of normal work up and advised OBGYN f/u.  She is agreeable with plan.   Final Clinical Impressions(s) / ED Diagnoses   Final diagnoses:  Pelvic pain  Tinea capitis    New Prescriptions Discharge Medication List as of 03/23/2017  5:08 AM    START taking these medications   Details  terbinafine (LAMISIL) 250 MG tablet Take 1 tablet (250 mg total) by mouth daily., Starting Thu 03/23/2017, Until Thu 05/04/2017, Print         Liberty Handy, PA-C 03/23/17  1701    Liberty Handy, PA-C 03/23/17 1702    Derwood Kaplan, MD 03/23/17 [phone]

## 2017-03-23 NOTE — Telephone Encounter (Signed)
She did not have a dermatologic complaint at her last visit with me. If she has one she would need to come in for a visit.

## 2017-03-23 NOTE — Discharge Instructions (Signed)
Los analysis de sangre y el ultrasonido resultaron normales. Tiene un quiste muy pequeo en el ovario derecho, probablemente esto no le esta causando sus sntomas.   Le hicimos examenes de infecciones sexuales (gonorrea, clamidia y tricomoniasis). Estos resultados estn pendientes. Usted solicit tratamiento, que le [place] ot[place].   La erupcin en la piel que tiene se llama "tia corporis". Por favor, lea la informacin sobre esto. Tome las pldoras segn las instrucciones. Programe una cita con su doctor general en 1 semana para una nueva evaluacin de sus sntomas y sarpullido.  The lab work and ultrasound were normal. You have a very small cyst in your right ovary, this is probably not causing your symptoms.   We tested you for gonorrhea, chlamydia and trichomoniasis.  These results are pending. You requested treatment, which was given to you.   The rash you have is called "tinea corporis".  Please read the information about this.   Take the pills as prescribed. Schedule an appointment with your primary care provider in 1 week for re-evaluation of your symptoms and rash.

## 2017-03-23 NOTE — ED Notes (Signed)
Patient transported to Ultrasound 

## 2017-03-24 NOTE — Telephone Encounter (Signed)
Thank You Dr Venetia NightAmao I spoke to patient but your next available appt is until 8/1 and she can wait that long . She is coming on  Tuesday to see PA Mcclung  .

## 2017-03-28 ENCOUNTER — Ambulatory Visit: Payer: Self-pay | Attending: Family Medicine | Admitting: Physician Assistant

## 2017-03-28 VITALS — BP 92/62 | HR 84 | Temp 98.4°F | Resp 18 | Ht 63.0 in | Wt 124.0 lb

## 2017-03-28 DIAGNOSIS — L42 Pityriasis rosea: Secondary | ICD-10-CM

## 2017-03-28 DIAGNOSIS — Z79899 Other long term (current) drug therapy: Secondary | ICD-10-CM | POA: Insufficient documentation

## 2017-03-28 DIAGNOSIS — E876 Hypokalemia: Secondary | ICD-10-CM | POA: Insufficient documentation

## 2017-03-28 DIAGNOSIS — N83201 Unspecified ovarian cyst, right side: Secondary | ICD-10-CM

## 2017-03-28 DIAGNOSIS — B379 Candidiasis, unspecified: Secondary | ICD-10-CM

## 2017-03-28 MED ORDER — FLUCONAZOLE 150 MG PO TABS: 150.0000 mg | ORAL_TABLET | Freq: Once | ORAL | 0 refills | Status: AC

## 2017-03-28 MED ORDER — FLUCONAZOLE 150 MG PO TABS: 150.0000 mg | ORAL_TABLET | Freq: Once | ORAL | 0 refills | Status: DC

## 2017-03-28 MED FILL — FLUCONAZOLE 150 MG TABLET: 150 | 1 days supply | Qty: 1 | Fill #0

## 2017-03-28 MED FILL — HYDROCORTISONE 2.5% CREAM: 2.5 | 15 days supply | Qty: 30 | Fill #1

## 2017-03-28 NOTE — Progress Notes (Signed)
Patient ID: [name], female   DOB: 08/19/1986, 31 y.o.   MRN: [id number]     [name], is a 31 y.o. female  [id number]:[id number]  [id number]:[id number]  DOB - 12/24/1985  Subjective:  Chief Complaint and HPI: [name] is a 31 y.o. female here today for a follow up visit After being seen in the ED for back pain, dyspareunia, vaginal discharge, and rash.  She was treated with 1g Azithromycin, 250mg  ceftriaxone IM, and metronidazole 2g in the ED. She was also prescribed terbinafine for tinea capitus/corporis.  U/S with 2.5cm ovarian cyst.  Tylenol helps some with pain.  No N/V/D.  She has had dyspareunia and vaginal discharge for a while now.  [facility] this issue [facility]  They have ordered a repeat U/S and she is supposed to call and schedule it for a few weeks from now.  [name] from [place] interpreters translating.  She has not gotten the lamisil filled and wants me to look at the rash.  Rash that is pruritic X 1 month.  Present on back/abdomen/trunk/upper thighs and base of neck/hairline.  Hydrocortisone not helpful.  No f/c   ED/Hospital notes reviewed.   Social History: Family history:  ROS:   Constitutional:  No f/c, No night sweats, No unexplained weight loss. EENT:  No vision changes, No blurry vision, No hearing changes. No mouth, throat, or ear problems.  Respiratory: No cough, No SOB Cardiac: No CP, no palpitations GI:  No abd pain, No N/V/D.  +R pelvic pain GU: No Urinary s/sx Musculoskeletal: No joint pain Neuro: No headache, no dizziness, no motor weakness.  Skin: + rash Endocrine:  No polydipsia. No polyuria.  Psych: Denies SI/HI  No problems updated.  ALLERGIES: No Known Allergies  PAST MEDICAL HISTORY: Past Medical History:  Diagnosis Date  . Bulimia   . Bulimia nervosa   . Eating disorder   . GERD (gastroesophageal reflux disease)     MEDICATIONS AT HOME: Prior to Admission medications     Medication Sig Start Date End Date Taking? Authorizing Provider  cetirizine (ZYRTEC) 10 MG tablet Take 1 tablet (10 mg total) by mouth daily. 02/22/17  Yes Georgian Co M, PA-C  hydrocortisone 2.5 % cream Apply topically 2 (two) times daily. 02/22/17  Yes Lillard Bailon, Marzella Schlein, PA-C  ibuprofen (ADVIL,MOTRIN) 600 MG tablet Take 1 tablet (600 mg total) by mouth every 8 (eight) hours as needed. 02/22/17  Yes Georgian Co M, PA-C  terbinafine (LAMISIL) 250 MG tablet Take 1 tablet (250 mg total) by mouth daily. 03/23/17 05/04/17 Yes Liberty Handy, PA-C  citalopram (CELEXA) 20 MG tablet Take 1 tablet (20 mg total) by mouth daily. Patient not taking: Reported on 02/22/2017 12/21/16   Jaclyn Shaggy, MD  fluconazole (DIFLUCAN) 150 MG tablet Take 1 tablet (150 mg total) by mouth once. 03/28/17 03/28/17  Anders Simmonds, PA-C     Objective:  EXAM:   Vitals:   03/28/17 1003  BP: 92/62  Pulse: 84  Resp: 18  Temp: 98.4 F (36.9 C)  TempSrc: Oral  SpO2: 99%  Weight: 124 lb (56.2 kg)  Height: 5\' 3"  (1.6 m)    General appearance : A&OX3. NAD. Non-toxic-appearing HEENT: Atraumatic and Normocephalic.  PERRLA. EOM intact.  TM clear B. Mouth-MMM, post pharynx WNL w/o erythema, No PND. Neck: supple, no JVD. No cervical lymphadenopathy. No thyromegaly Chest/Lungs:  Breathing-non-labored, Good air entry bilaterally, breath sounds normal without rales, rhonchi,  or wheezing  CVS: S1 S2 regular, no murmurs, gallops, rubs  Abdomen: Bowel sounds present, Non tender and not distended with no gaurding, rigidity or rebound.  Minimal RLQ TTP.  No psoas/illiopsoas/McBurney's point TTP Extremities: Bilateral Lower Ext shows no edema, both legs are warm to touch with = pulse throughout Neurology:  CN II-XII grossly intact, Non focal.   Psych:  TP linear. J/I WNL. Normal speech. Appropriate eye contact and affect.  Skin:  PR appearing rash on back/abdomen/trunk/upper thighs-likely herald patch RLB.  Does not appear  like tinea to me.  Data Review No results found for: HGBA1C   Assessment & Plan   1. Hypokalemia Eating d/o stable by history.  Still engaged in counseling - Comprehensive metabolic panel  2. High risk medication use - Comprehensive metabolic panel  3. Pityriasis rosea-do not agree with tinea corporis/capitus diagnosis at ED. Use zyrtec daily for itching.  Patient unhappy with this diagnosis and requests referral - Ambulatory referral to Dermatology  4. Cyst of right ovary Continue [facility].  To ED if s/sx worsen.    5. Yeast infection Likely to have due to recent antibiotic use - fluconazole (DIFLUCAN) 150 MG tablet; Take 1 tablet (150 mg total) by mouth once.  Dispense: 1 tablet; Refill: 0  Spent >7330mins face to face with patient only to learn that the ovarian cyst and f/up [facility]   Patient have been counseled extensively about nutrition and exercise  Return if symptoms worsen or fail to improve.  The patient was given clear instructions to go to ER or return to medical center if symptoms don't improve, worsen or new problems develop. The patient verbalized understanding. The patient was told to call to get lab results if they haven't heard anything in the next week.     Georgian CoAngela Imane Burrough, PA-C [facility] [place], [place] [phone]   03/28/2017, 10:45 AM

## 2017-03-28 NOTE — Patient Instructions (Signed)
Pitiriasis rosada °(Pityriasis Rosea) °La pitiriasis rosada es una erupción cutánea que suele aparecer en el tronco del cuerpo. También puede manifestarse en la parte superior de los brazos y las piernas. Habitualmente, comienza como una sola mancha y luego aparecen otras. La erupción cutánea puede causar picazón leve, pero generalmente no causa otros problemas y desaparece sin tratamiento. Sin embargo, pueden pasar semanas o meses para que la erupción desaparezca por completo. °CAUSAS °Se desconoce la causa de esta afección. La afección no se transmite de una persona a persona (no es contagiosa). °FACTORES DE RIESGO °Es más probable que esta afección se manifieste en los adultos jóvenes y los niños. Es más frecuente en otoño y primavera. °SÍNTOMAS °El síntoma principal de esta afección es una erupción cutánea. °· Por lo general, la erupción cutánea comienza con una sola mancha ovalada que suele ser más grande que las que aparecen luego. A esta mancha se la denomina mancha heráldica. Suele aparecer al menos una semana antes de que aparezca el resto de la erupción cutánea. °· Las manchas posteriores se extienden rápidamente al tronco, la espalda y los brazos. Estas son más pequeñas que la primera. °· Las manchas de la erupción cutánea habitualmente son de forma ovalada y de color rosa o rojo. En general son planas, pero a veces pueden tener un poco de volumen y se las puede percibir al tacto. También pueden tener arrugas finas y un anillo escamoso alrededor del borde. °· Por lo general, la erupción cutánea no aparece en las áreas de la piel expuestas al sol. °La mayoría de las personas que tienen esta afección no presentan otros síntomas, pero algunos sufren de picazón leve. En pocos casos, otros síntomas incluyen dolores leves de cabeza o corporales antes de que aparezca la erupción cutánea, pero estos luego desaparecen. °DIAGNÓSTICO °El médico puede diagnosticar la enfermedad en función de un examen físico y de su  historia clínica. Para descartar otras causas posibles de la erupción cutánea, el médico puede solicitar análisis de sangre o tomar una muestra de la piel donde está la erupción para examinar con un microscopio. °TRATAMIENTO °Generalmente, no se requiere un tratamiento para este trastorno. Es probable que la erupción cutánea desaparezca por su cuenta en 4 a 8 semanas. En algunos casos, el médico puede recomendarle o recetarle un medicamento para reducir la picazón. °INSTRUCCIONES PARA EL CUIDADO EN EL HOGAR °· Tome los medicamentos solamente como se lo haya indicado el médico. °· Evite rascarse las zonas de la piel que están afectadas por la erupción. °· No tome baños de inmersión calientes ni utilice el sauna. Cuando tome un baño o una ducha, use solamente agua tibia. El calor puede aumentar la picazón. °SOLICITE ATENCIÓN MÉDICA SI: °· La erupción cutánea no desaparece en 8 semanas. °· La erupción cutánea empeora mucho. °· Tiene fiebre. °· Presenta hinchazón o dolor en la zona de la erupción cutánea. °· Observa un líquido, sangre o pus que salen de la zona de la erupción cutánea. °Esta información no tiene como fin reemplazar el consejo del médico. Asegúrese de hacerle al médico cualquier pregunta que tenga. °Document Released: 06/29/2005 Document Revised: 02/03/2015 Document Reviewed: 08/27/2014 °Elsevier Interactive Patient Education © 2018 Elsevier Inc. ° °

## 2017-03-29 LAB — COMPREHENSIVE METABOLIC PANEL
ALT: 8 IU/L (ref 0–32)
AST: 19 IU/L (ref 0–40)
Albumin/Globulin Ratio: 1.7 (ref 1.2–2.2)
Albumin: 4.5 g/dL (ref 3.5–5.5)
Alkaline Phosphatase: 85 IU/L (ref 39–117)
BUN/Creatinine Ratio: 9 (ref 9–23)
BUN: 6 mg/dL (ref 6–20)
Bilirubin Total: 0.2 mg/dL (ref 0.0–1.2)
CO2: 21 mmol/L (ref 20–29)
CREATININE: 0.67 mg/dL (ref 0.57–1.00)
Calcium: 9.1 mg/dL (ref 8.7–10.2)
Chloride: 103 mmol/L (ref 96–106)
GFR calc Af Amer: 135 mL/min/{1.73_m2} (ref >59)
GFR calc non Af Amer: 117 mL/min/{1.73_m2} (ref >59)
GLOBULIN, TOTAL: 2.6 g/dL (ref 1.5–4.5)
Glucose: 89 mg/dL (ref 65–99)
Potassium: 4.3 mmol/L (ref 3.5–5.2)
SODIUM: 139 mmol/L (ref 134–144)
Total Protein: 7.1 g/dL (ref 6.0–8.5)

## 2017-04-07 ENCOUNTER — Ambulatory Visit: Payer: Self-pay | Attending: Family Medicine

## 2017-04-13 ENCOUNTER — Telehealth: Payer: Self-pay | Admitting: Family Medicine

## 2017-04-13 MED FILL — CHLORHEXIDINE 0.12% RINSE: 0.12 | 30 days supply | Qty: 473 | Fill #0

## 2017-04-13 NOTE — Telephone Encounter (Signed)
Pt called need lab result please call her back in Spanish please, please follow up

## 2017-04-13 NOTE — Telephone Encounter (Signed)
Patients labs came back normal as well.

## 2017-04-13 NOTE — Telephone Encounter (Signed)
PCP is out of the office and can not address the concern at this time. Patient was referred to dermatology and has the orange card. [facility] informed patient of possibly being a few months before appointment is scheduled.

## 2017-04-18 NOTE — Telephone Encounter (Signed)
Patient is calling again for results from visit with PCP 6/26.  Please followup

## 2017-04-18 NOTE — Telephone Encounter (Signed)
Kendra BosworthCarlos shared results with patient which were normal and informed patient of dermatology being scheduled through [facility]. Patient was instructed to call number on the back of card to inquire further.

## 2017-05-22 ENCOUNTER — Ambulatory Visit: Payer: Self-pay | Attending: Family Medicine | Admitting: Family Medicine

## 2017-05-22 ENCOUNTER — Encounter: Payer: Self-pay | Admitting: Family Medicine

## 2017-05-22 VITALS — BP 106/68 | HR 80 | Temp 98.0°F | Ht 63.0 in | Wt 121.0 lb

## 2017-05-22 DIAGNOSIS — F502 Bulimia nervosa: Secondary | ICD-10-CM | POA: Insufficient documentation

## 2017-05-22 DIAGNOSIS — K219 Gastro-esophageal reflux disease without esophagitis: Secondary | ICD-10-CM | POA: Insufficient documentation

## 2017-05-22 DIAGNOSIS — F419 Anxiety disorder, unspecified: Secondary | ICD-10-CM | POA: Insufficient documentation

## 2017-05-22 DIAGNOSIS — F329 Major depressive disorder, single episode, unspecified: Secondary | ICD-10-CM | POA: Insufficient documentation

## 2017-05-22 DIAGNOSIS — B354 Tinea corporis: Secondary | ICD-10-CM

## 2017-05-22 MED ORDER — TERBINAFINE HCL 250 MG PO TABS: 250.0000 mg | ORAL_TABLET | Freq: Every day | ORAL | 1 refills | Status: DC

## 2017-05-22 MED FILL — TERBINAFINE HCL 250 MG TAB: 250 | 30 days supply | Qty: 30 | Fill #0

## 2017-05-22 NOTE — Patient Instructions (Signed)
Tia corporal  (Body Ringworm)  La tia corporal es una infeccin de la piel que suele causar una erupcin en forma de anillos. Puede afectar cualquier zona de la piel. Esta afeccin puede propagarse fcilmente a otras personas. A la tia corporal tambin se la conoce como tinea corporis.  CAUSAS  Esta afeccin es causada por hongos llamados dermatofitos. Se manifiesta cuando estos hongos crecen sin control en la piel.  Es posible contagiarse la afeccin al tocar a una persona o un animal que la tiene. Tambin, al compartir ropa, ropa de cama, toallas o cualquier otro objeto con una persona o una mascota infectada.  FACTORES DE RIESGO  Es ms probable que esta afeccin se manifieste en:   Los atletas que suelen tener contacto piel a piel con otros atletas, como los luchadores.   Las personas que comparten equipos y colchonetas.   Las personas con el sistema inmunitario debilitado.  SNTOMAS  Los sntomas de esta afeccin incluyen lo siguiente:   Manchas y bultos rojos elevados que causan picazn.   Manchas rojas escamosas.   Una erupcin en forma de anillos. La erupcin cutnea puede consistir en lo siguiente:  ? Un centro claro.  ? Escamas o bultos rojos en el medio.  ? Enrojecimiento cerca de los bordes.  ? Piel seca y escamosa dentro o alrededor de ella.  DIAGNSTICO  Generalmente, esta afeccin puede diagnosticarse con un examen de la piel. Se puede tomar una muestra de la piel de la zona afectada y examinarla con un microscopio para determinar si hay hongos.  TRATAMIENTO  El tratamiento de esta afeccin puede incluir lo siguiente:   Una crema o una pomada antimictica.   Un champ antimictico.   Medicamentos antimicticos. Le pueden recetar estos productos si la tia es grave, si reaparece o si se prolonga durante mucho tiempo.  INSTRUCCIONES PARA EL CUIDADO EN EL HOGAR   Tome los medicamentos de venta libre y los recetados solamente como se lo haya indicado el mdico.   Si le indicaron una crema o  una pomada antimictica:  ? selo como se lo haya indicado el mdico.  ? Lave el rea de la infeccin y squela bien antes de aplicar la crema o la pomada.   Si le indicaron un champ antimictico:  ? selo como se lo haya indicado el mdico.  ? Deje actuar el champ en el cuerpo durante 3 a 5minutos antes de enjuagarlo.   Mientras tiene la erupcin:  ? Use ropa suelta para evitar roces e irritacin.  ? Lave o cambie las sbanas cada noche.   Si su mascota tiene la misma infeccin, llvela al veterinario.  PREVENCIN   Mantenga una buena higiene.   Use sandalias o zapatos en lugares pblicos y duchas.   No comparta los artculos de uso personal con otras personas.   Evite tocar las manchas rojas de piel de otras personas.   No toque las mascotas que tienen zonas sin pelos.   Si lo hace, lvese las manos.  SOLICITE ATENCIN MDICA SI:   La erupcin contina diseminndose despus de 7 das de tratamiento.   No se cura en el trmino de 4 semanas.   El rea alrededor de la erupcin se vuelve roja, est caliente al tacto, le duele o se hincha.  Esta informacin no tiene como fin reemplazar el consejo del mdico. Asegrese de hacerle al mdico cualquier pregunta que tenga.  Document Released: 06/29/2005 Document Revised: 01/11/2016 Document Reviewed: 07/16/2015  Elsevier Interactive Patient Education    2018 Elsevier Inc.

## 2017-05-22 NOTE — Progress Notes (Signed)
Subjective:  Patient ID: [name], female    DOB: 02/14/1986  Age: 31 y.o. MRN: [id number]  CC: Follow-up   HPI [name] is a 31 year old female with a history of bulimia nervosa, anxiety and depression who presents today with a chronic rash for the last 3 months. Rash has been pruritic and she received a prescription for Lamisil from the ED which she never took because at a subsequent visit with the PA she was diagnosed with pityriasis rosea and advised against taking it. She was also referred to a dermatologist whom she has been unable to see due to lack of medical coverage and review of her chart indicates the referral was placed in the [facility] work queue Rash has not improved but has since spread to involve her upper thighs.   Past Medical History:  Diagnosis Date  . Bulimia   . Bulimia nervosa   . Eating disorder   . GERD (gastroesophageal reflux disease)     Past Surgical History:  Procedure Laterality Date  . COLONOSCOPY WITH PROPOFOL N/A 03/18/2016   Procedure: COLONOSCOPY WITH PROPOFOL;  Surgeon: Vida Rigger, MD;  Location: [facility];  Service: Endoscopy;  Laterality: N/A;  . NO PAST SURGERIES      No Known Allergies   Past Medical History:  Diagnosis Date  . Bulimia   . Bulimia nervosa   . Eating disorder   . GERD (gastroesophageal reflux disease)     Past Surgical History:  Procedure Laterality Date  . COLONOSCOPY WITH PROPOFOL N/A 03/18/2016   Procedure: COLONOSCOPY WITH PROPOFOL;  Surgeon: Vida Rigger, MD;  Location: [facility];  Service: Endoscopy;  Laterality: N/A;  . NO PAST SURGERIES      No Known Allergies   Outpatient Medications Prior to Visit  Medication Sig Dispense Refill  . cetirizine (ZYRTEC) 10 MG tablet Take 1 tablet (10 mg total) by mouth daily. (Patient not taking: Reported on 05/22/2017) 30 tablet 11  . citalopram (CELEXA) 20 MG tablet Take 1 tablet (20 mg total) by mouth daily. (Patient not taking: Reported on  02/22/2017) 30 tablet 3  . hydrocortisone 2.5 % cream Apply topically 2 (two) times daily. (Patient not taking: Reported on 05/22/2017) 30 g 5  . ibuprofen (ADVIL,MOTRIN) 600 MG tablet Take 1 tablet (600 mg total) by mouth every 8 (eight) hours as needed. (Patient not taking: Reported on 05/22/2017) 30 tablet 0   No facility-administered medications prior to visit.     ROS Review of Systems  Constitutional: Negative for activity change, appetite change and fatigue.  HENT: Negative for congestion, sinus pressure and sore throat.   Eyes: Negative for visual disturbance.  Respiratory: Negative for cough, chest tightness, shortness of breath and wheezing.   Cardiovascular: Negative for chest pain and palpitations.  Gastrointestinal: Negative for abdominal distention, abdominal pain and constipation.  Endocrine: Negative for polydipsia.  Genitourinary: Negative for dysuria and frequency.  Musculoskeletal: Negative for arthralgias and back pain.  Skin:       See hpi  Neurological: Negative for tremors, light-headedness and numbness.  Hematological: Does not bruise/bleed easily.  Psychiatric/Behavioral: Negative for agitation and behavioral problems.    Objective:  BP 106/68   Pulse 80   Temp 98 F (36.7 C) (Oral)   Ht 5\' 3"  (1.6 m)   Wt 121 lb (54.9 kg)   SpO2 98%   BMI 21.43 kg/m   BP/Weight 05/22/2017 03/28/2017 03/23/2017  Systolic BP 106 92 108  Diastolic BP 68 62 79  Wt. (Lbs) 121 124 -  BMI 21.43 21.97 -      Physical Exam  Constitutional: She is oriented to person, place, and time. She appears well-developed and well-nourished.  Cardiovascular: Normal rate, normal heart sounds and intact distal pulses.   No murmur heard. Pulmonary/Chest: Effort normal and breath sounds normal. She has no wheezes. She has no rales. She exhibits no tenderness.  Abdominal: Soft. Bowel sounds are normal. She exhibits no distension and no mass. There is no tenderness.  Musculoskeletal: Normal  range of motion.  Neurological: She is alert and oriented to person, place, and time.  Skin:  Rash of various sizes .Large patches with central clearing on right side of thorax and beneath right breast. Smaller patches distributed on her anterior trunk and upper thighs sparing legs, upper extremities, back and face     Assessment & Plan:   1. Tinea corporis We'll need to keep an eye on liver enzymes Discussed side effects of medication We'll repeat serum comprehensive metabolic profile at next visit - terbinafine (LAMISIL) 250 MG tablet; Take 1 tablet (250 mg total) by mouth daily.  Dispense: 30 tablet; Refill: 1   Meds ordered this encounter  Medications  . terbinafine (LAMISIL) 250 MG tablet    Sig: Take 1 tablet (250 mg total) by mouth daily.    Dispense:  30 tablet    Refill:  1    Follow-up: Return in about 6 weeks (around 07/03/2017) for follow up of Tinea corporis.   This note has been created with Education officer, environmental. Any transcriptional errors are unintentional.     Jaclyn Shaggy MD

## 2017-05-23 ENCOUNTER — Telehealth: Payer: Self-pay | Admitting: Family Medicine

## 2017-05-23 NOTE — Telephone Encounter (Signed)
Pt is calling to request a refill for the med Cloternazole gel %.05 She need this for her hand , this med was prescribe by outside provider that she not see anymore, she is asking if you can prescribe her this med or something similar, please flow up with pt

## 2017-05-25 ENCOUNTER — Emergency Department (HOSPITAL_COMMUNITY)
Admission: EM | Admit: 2017-05-25 | Discharge: 2017-05-25 | Disposition: A | Discharge status: Home / Self Care | Payer: Self-pay | Attending: Emergency Medicine | Admitting: Emergency Medicine

## 2017-05-25 ENCOUNTER — Encounter (HOSPITAL_COMMUNITY): Payer: Self-pay | Admitting: Emergency Medicine

## 2017-05-25 ENCOUNTER — Encounter (HOSPITAL_COMMUNITY): Payer: Self-pay

## 2017-05-25 ENCOUNTER — Telehealth: Payer: Self-pay | Admitting: Family Medicine

## 2017-05-25 ENCOUNTER — Ambulatory Visit (HOSPITAL_COMMUNITY)
Admission: EM | Admit: 2017-05-25 | Discharge: 2017-05-25 | Disposition: A | Discharge status: Home / Self Care | Payer: Self-pay | Attending: Emergency Medicine | Admitting: Emergency Medicine

## 2017-05-25 DIAGNOSIS — K219 Gastro-esophageal reflux disease without esophagitis: Secondary | ICD-10-CM

## 2017-05-25 DIAGNOSIS — B354 Tinea corporis: Secondary | ICD-10-CM

## 2017-05-25 DIAGNOSIS — R112 Nausea with vomiting, unspecified: Secondary | ICD-10-CM

## 2017-05-25 DIAGNOSIS — R109 Unspecified abdominal pain: Secondary | ICD-10-CM | POA: Insufficient documentation

## 2017-05-25 DIAGNOSIS — Z5321 Procedure and treatment not carried out due to patient leaving prior to being seen by health care provider: Secondary | ICD-10-CM | POA: Insufficient documentation

## 2017-05-25 DIAGNOSIS — G8929 Other chronic pain: Secondary | ICD-10-CM

## 2017-05-25 DIAGNOSIS — R1013 Epigastric pain: Secondary | ICD-10-CM

## 2017-05-25 LAB — I-STAT BETA HCG BLOOD, ED (MC, WL, AP ONLY)

## 2017-05-25 LAB — COMPREHENSIVE METABOLIC PANEL
ALK PHOS: 53 U/L (ref 38–126)
ALT: 15 U/L (ref 14–54)
AST: 19 U/L (ref 15–41)
Albumin: 4.1 g/dL (ref 3.5–5.0)
Anion gap: 8 (ref 5–15)
BUN: <5 mg/dL — ABNORMAL LOW (ref 6–20)
CALCIUM: 9 mg/dL (ref 8.9–10.3)
CO2: 24 mmol/L (ref 22–32)
Chloride: 106 mmol/L (ref 101–111)
Creatinine, Ser: 0.6 mg/dL (ref 0.44–1.00)
GFR calc Af Amer: >60 mL/min (ref >60)
GFR calc non Af Amer: >60 mL/min (ref >60)
GLUCOSE: 96 mg/dL (ref 65–99)
Potassium: 3.6 mmol/L (ref 3.5–5.1)
SODIUM: 138 mmol/L (ref 135–145)
Total Bilirubin: 0.5 mg/dL (ref 0.3–1.2)
Total Protein: 7 g/dL (ref 6.5–8.1)

## 2017-05-25 LAB — CBC
HCT: 37.6 % (ref 36.0–46.0)
HEMOGLOBIN: 12.9 g/dL (ref 12.0–15.0)
MCH: 29.6 pg (ref 26.0–34.0)
MCHC: 34.3 g/dL (ref 30.0–36.0)
MCV: 86.2 fL (ref 78.0–100.0)
PLATELETS: 394 10*3/uL (ref 150–400)
RBC: 4.36 MIL/uL (ref 3.87–5.11)
RDW: 12.5 % (ref 11.5–15.5)
WBC: 7.3 10*3/uL (ref 4.0–10.5)

## 2017-05-25 LAB — LIPASE, BLOOD: Lipase: 42 U/L (ref 11–51)

## 2017-05-25 MED ORDER — ONDANSETRON 4 MG PO TBDP: 4.0000 mg | ORAL_TABLET | Freq: Once | ORAL | Status: DC

## 2017-05-25 MED ORDER — GRISEOFULVIN MICROSIZE 500 MG PO TABS: 500.0000 mg | ORAL_TABLET | Freq: Every day | ORAL | 0 refills | Status: DC

## 2017-05-25 MED ORDER — PROCHLORPERAZINE MALEATE 10 MG PO TABS: 10.0000 mg | ORAL_TABLET | Freq: Two times a day (BID) | ORAL | 0 refills | Status: DC | PRN

## 2017-05-25 MED ORDER — RANITIDINE HCL 150 MG PO TABS: 150.0000 mg | ORAL_TABLET | Freq: Every day | ORAL | 0 refills | Status: DC

## 2017-05-25 NOTE — ED Triage Notes (Signed)
PT reports epigastric burning and nausea since Monday. PT reports she has been taking pantoprazole without relief.

## 2017-05-25 NOTE — ED Triage Notes (Addendum)
Pt endorses mid abd burning x 3 days with n/v. Denies fever or chills. Pt has hx of gastritis. VSS. Pt speaks spanish and all information gathered with the interpreter IPAD.

## 2017-05-25 NOTE — ED Notes (Signed)
Pt decided to leave and went to UC, UC called to take patient out of system

## 2017-05-25 NOTE — ED Triage Notes (Signed)
PT triaged with interpreter [phone]

## 2017-05-25 NOTE — Telephone Encounter (Signed)
Pt called to request if you can change the med terbinafine (LAMISIL) 250 MG tablet   to something else since it cause to much pain on her stomach, she is wondering if you can prescribe something different for her , please let her know

## 2017-05-25 NOTE — ED Provider Notes (Signed)
[facility]    CSN: [id number] Arrival date & time: 05/25/17  1615     History   Chief Complaint Chief Complaint  Patient presents with  . Abdominal Pain    HPI [name] is a 31 y.o. female.  Spanish speaking. Professional Research officer, trade union used throughout pt's visit.   HPI [name] is a 31 y.o. female presenting to UC with c/o epigastric burning, nausea and vomiting since Monday.  She has been taking Pantoprazole without relief despite recently increasing the dose as recommended by her PCP.  Pain is worse with eating and drinking.  She also reports starting terbinafine for tinea 4 days ago.  She is concerned that may be causing some of her stomach upset.  Denies fever, chills, or urinary symptoms. Denies back pain.    Past Medical History:  Diagnosis Date  . Bulimia   . Bulimia nervosa   . Eating disorder   . GERD (gastroesophageal reflux disease)     Patient Active Problem List   Diagnosis Date Noted  . Pregnancy 04/12/2014  . Atypical chest pain 05/15/2013  . Left arm pain 05/15/2013  . Starvation ketoacidosis 04/12/2013  . Syncope 04/12/2013  . Hypokalemia 04/12/2013  . Protein-calorie malnutrition, severe (HCC) 04/12/2013  . Prolonged Q-T interval on ECG 04/12/2013  . Depression 04/16/2012  . Underweight 03/20/2012  . Bulimia nervosa 03/20/2012  . Amenorrhea, secondary 03/20/2012    Past Surgical History:  Procedure Laterality Date  . COLONOSCOPY WITH PROPOFOL N/A 03/18/2016   Procedure: COLONOSCOPY WITH PROPOFOL;  Surgeon: Vida Rigger, MD;  Location: [facility];  Service: Endoscopy;  Laterality: N/A;  . NO PAST SURGERIES      OB History    Gravida Para Term Preterm AB Living   3 3 3  0 0 3   SAB TAB Ectopic Multiple Live Births   0 0 0   3       Home Medications    Prior to Admission medications   Medication Sig Start Date End Date Taking? Authorizing Provider  cetirizine (ZYRTEC) 10 MG tablet Take 1 tablet  (10 mg total) by mouth daily. Patient not taking: Reported on 05/22/2017 02/22/17   Anders Simmonds, PA-C  citalopram (CELEXA) 20 MG tablet Take 1 tablet (20 mg total) by mouth daily. Patient not taking: Reported on 02/22/2017 12/21/16   Jaclyn Shaggy, MD  griseofulvin (GRIFULVIN V) 500 MG tablet Take 1 tablet (500 mg total) by mouth daily. For up to 4 weeks 05/25/17   Lurene Shadow, PA-C  hydrocortisone 2.5 % cream Apply topically 2 (two) times daily. Patient not taking: Reported on 05/22/2017 02/22/17   Anders Simmonds, PA-C  ibuprofen (ADVIL,MOTRIN) 600 MG tablet Take 1 tablet (600 mg total) by mouth every 8 (eight) hours as needed. Patient not taking: Reported on 05/22/2017 02/22/17   Anders Simmonds, PA-C  prochlorperazine (COMPAZINE) 10 MG tablet Take 1 tablet (10 mg total) by mouth 2 (two) times daily as needed for nausea or vomiting. 05/25/17   Lurene Shadow, PA-C  ranitidine (ZANTAC) 150 MG tablet Take 1 tablet (150 mg total) by mouth at bedtime. 05/25/17   Lurene Shadow, PA-C    Family History Family History  Problem Relation Age of Onset  . Hearing loss Neg Hx   . Cancer Neg Hx   . Diabetes Neg Hx   . Heart failure Neg Hx     Social History Social History  Substance Use Topics  . Smoking  status: Never Smoker  . Smokeless tobacco: Never Used  . Alcohol use No     Allergies   Patient has no known allergies.   Review of Systems Review of Systems  Constitutional: Negative for chills and fever.  HENT: Negative for congestion, ear pain, sore throat, trouble swallowing and voice change.   Respiratory: Negative for cough and shortness of breath.   Cardiovascular: Negative for chest pain and palpitations.  Gastrointestinal: Positive for abdominal pain, nausea and vomiting. Negative for diarrhea.  Genitourinary: Negative for dysuria, flank pain, frequency and hematuria.  Musculoskeletal: Negative for arthralgias, back pain and myalgias.  Skin: Positive for rash.      Physical Exam Triage Vital Signs ED Triage Vitals  Enc Vitals Group     BP --      Pulse --      Resp 05/25/17 1705 16     Temp --      Temp Source 05/25/17 1705 Oral     SpO2 --      Weight 05/25/17 1701 121 lb (54.9 kg)     Height --      Head Circumference --      Peak Flow --      Pain Score --      Pain Loc --      Pain Edu? --      Excl. in GC? --    No data found.   Updated Vital Signs BP 110/70 (BP Location: Left Arm)   Pulse 86   Temp 98.4 F (36.9 C) (Oral)   Resp 16   Wt 121 lb (54.9 kg)   SpO2 100%   BMI 21.43 kg/m   Visual Acuity Right Eye Distance:   Left Eye Distance:   Bilateral Distance:    Right Eye Near:   Left Eye Near:    Bilateral Near:     Physical Exam  Constitutional: She is oriented to person, place, and time. She appears well-developed and well-nourished. No distress.  HENT:  Head: Normocephalic and atraumatic.  Eyes: EOM are normal.  Neck: Normal range of motion.  Cardiovascular: Normal rate.   Pulmonary/Chest: Effort normal. No respiratory distress.  Abdominal: Soft. She exhibits no distension. There is tenderness.  Musculoskeletal: Normal range of motion.  Neurological: She is alert and oriented to person, place, and time.  Skin: Skin is warm and dry. Rash noted. She is not diaphoretic.  multiple flat erythematous patches with centralized clearing on abdomen, back and legs. Large patch under Right breast.  Non-tender. No induration.   Psychiatric: She has a normal mood and affect. Her behavior is normal.  Nursing note and vitals reviewed.    UC Treatments / Results  Labs (all labs ordered are listed, but only abnormal results are displayed) Labs Reviewed - No data to display  EKG  EKG Interpretation None       Radiology No results found.  Procedures Procedures (including critical care time)  Medications Ordered in UC Medications - No data to display   Initial Impression / Assessment and Plan / UC  Course  I have reviewed the triage vital signs and the nursing notes.  Pertinent labs & imaging results that were available during my care of the patient were reviewed by me and considered in my medical decision making (see chart for details).     Nausea and vomiting could be due to the terbinafine or could be due GERD as pt is having epigastric pain as well. Will add Zantac And  change pt from terbinafine to griseofulvin  May have compazine for nausea (pt has hx of prolonged QT, will stay away from promethazine and zofran)  Final Clinical Impressions(s) / UC Diagnoses   Final diagnoses:  Abdominal pain, chronic, epigastric  Gastroesophageal reflux disease, esophagitis presence not specified  Tinea corporis  Nausea and vomiting in adult patient   Home care instructions provided Encouraged f/u with dermatologist as previously scheduled in September  F/u with PCP as needed for further evaluation and treatment of her GERD  New Prescriptions Discharge Medication List as of 05/25/2017  5:20 PM    START taking these medications   Details  griseofulvin (GRIFULVIN V) 500 MG tablet Take 1 tablet (500 mg total) by mouth daily. For up to 4 weeks, Starting Thu 05/25/2017, Print    prochlorperazine (COMPAZINE) 10 MG tablet Take 1 tablet (10 mg total) by mouth 2 (two) times daily as needed for nausea or vomiting., Starting Thu 05/25/2017, Print    ranitidine (ZANTAC) 150 MG tablet Take 1 tablet (150 mg total) by mouth at bedtime., Starting Thu 05/25/2017, Print         Controlled Substance Prescriptions [place] Controlled Substance Registry consulted? Not Applicable   Rolla Plate 05/25/17 1744

## 2017-05-26 MED FILL — PROCHLORPERAZINE 10 MG TAB: 10 | 5 days supply | Qty: 10 | Fill #0

## 2017-05-26 MED FILL — raNITIdine HCL 150 MG TABS: 150 | 30 days supply | Qty: 30 | Fill #0

## 2017-05-26 MED FILL — GRISEOFULVIN MICRO 500 MG T: 500 | 30 days supply | Qty: 30 | Fill #0

## 2017-05-29 NOTE — Telephone Encounter (Signed)
She was switched from terbinafine to Griseofulvin at an urgent care visit

## 2017-05-29 NOTE — Telephone Encounter (Signed)
Will route to PCP 

## 2017-06-23 MED FILL — TRIAMCINOLONE 0.1% CREAM: 0.1 | 30 days supply | Qty: 80 | Fill #0

## 2017-06-30 ENCOUNTER — Telehealth: Payer: Self-pay | Admitting: Family Medicine

## 2017-06-30 NOTE — Telephone Encounter (Signed)
Pt came into office requesting medication refill on hydrocortisone 2.5 % cream . Please f/up

## 2017-07-04 NOTE — Telephone Encounter (Signed)
Called pt, LVM for her to follow up with PCP during office visit

## 2017-07-04 NOTE — Telephone Encounter (Signed)
She was supposed to follow-up on her rash which was treated at her last visit. Need for the cream she is requesting will be assessed at that time.

## 2017-07-10 ENCOUNTER — Ambulatory Visit: Payer: Self-pay | Attending: Family Medicine | Admitting: Family Medicine

## 2017-07-10 ENCOUNTER — Encounter: Payer: Self-pay | Admitting: Family Medicine

## 2017-07-10 VITALS — BP 95/64 | HR 87 | Temp 97.5°F | Ht 63.0 in | Wt 120.0 lb

## 2017-07-10 DIAGNOSIS — N898 Other specified noninflammatory disorders of vagina: Secondary | ICD-10-CM

## 2017-07-10 DIAGNOSIS — L309 Dermatitis, unspecified: Secondary | ICD-10-CM | POA: Insufficient documentation

## 2017-07-10 DIAGNOSIS — B9689 Other specified bacterial agents as the cause of diseases classified elsewhere: Secondary | ICD-10-CM | POA: Insufficient documentation

## 2017-07-10 DIAGNOSIS — L308 Other specified dermatitis: Secondary | ICD-10-CM

## 2017-07-10 DIAGNOSIS — K219 Gastro-esophageal reflux disease without esophagitis: Secondary | ICD-10-CM | POA: Insufficient documentation

## 2017-07-10 DIAGNOSIS — Z79899 Other long term (current) drug therapy: Secondary | ICD-10-CM | POA: Insufficient documentation

## 2017-07-10 DIAGNOSIS — R300 Dysuria: Secondary | ICD-10-CM

## 2017-07-10 DIAGNOSIS — Z791 Long term (current) use of non-steroidal anti-inflammatories (NSAID): Secondary | ICD-10-CM | POA: Insufficient documentation

## 2017-07-10 DIAGNOSIS — Z23 Encounter for immunization: Secondary | ICD-10-CM

## 2017-07-10 DIAGNOSIS — B373 Candidiasis of vulva and vagina: Secondary | ICD-10-CM | POA: Insufficient documentation

## 2017-07-10 LAB — POCT URINALYSIS DIPSTICK
BILIRUBIN UA: NEGATIVE
Glucose, UA: NEGATIVE
KETONES UA: NEGATIVE
LEUKOCYTES UA: NEGATIVE
Nitrite, UA: NEGATIVE
PROTEIN UA: 30
RBC UA: NEGATIVE
SPEC GRAV UA: 1.02 (ref 1.010–1.025)
Urobilinogen, UA: 1 E.U./dL
pH, UA: 6.5 (ref 5.0–8.0)

## 2017-07-10 MED ORDER — TRIAMCINOLONE ACETONIDE 0.1 % EX CREA: 1.0000 "application " | TOPICAL_CREAM | Freq: Two times a day (BID) | CUTANEOUS | 1 refills | Status: DC

## 2017-07-10 MED ORDER — METRONIDAZOLE 0.75 % VA GEL: 1.0000 | Freq: Every day | VAGINAL | 0 refills | Status: DC

## 2017-07-10 MED ORDER — FLUCONAZOLE 150 MG PO TABS: 150.0000 mg | ORAL_TABLET | Freq: Once | ORAL | 0 refills | Status: AC

## 2017-07-10 MED FILL — FLUCONAZOLE 150 MG TABLET: 150 | 1 days supply | Qty: 1 | Fill #0

## 2017-07-10 MED FILL — VANDAZOLE VAGINAL 0.75% GEL: 0.75 | 5 days supply | Qty: 70 | Fill #0

## 2017-07-10 MED FILL — ?TRIAMCINOLONE 0.1% CRM: 0.1 | 30 days supply | Qty: 60 | Fill #0

## 2017-07-10 NOTE — Patient Instructions (Signed)
Eczema (Eczema) El eczema, tambin llamada dermatitis atpica, es una afeccin de la piel que causa inflamacin de la misma. Este trastorno produce una erupcin roja y sequedad y escamas en la piel. Hay gran picazn. El eczema generalmente empeora durante los meses fros del invierno y generalmente desaparece o mejora con el tiempo clido del verano. El eczema generalmente comienza a manifestarse en la infancia. Algunos nios desarrollan este trastorno y ste puede prolongarse en la Estate manager/land agentadultez. CAUSAS La causa exacta no se conoce pero parece ser una afeccin hereditaria. Generalmente las personas que sufren eczema tienen una historia familiar de eczema, alergias, asma o fiebre de heno. Esta enfermedad no es contagiosa. Algunas causas de los brotes pueden ser:  Contacto con alguna cosa a la que es sensible o Best boyalrgico.  Librarian, academicstrs. SIGNOS Y SNTOMAS  Piel seca y escamosa.  Erupcin roja y que pica.  Picazn. Esta puede ocurrir antes de que aparezca la erupcin y puede ser muy intensa.  DIAGNSTICO El diagnstico de eczema se realiza basndose en los sntomas y en la historia clnica. TRATAMIENTO El eczema no puede curarse, pero los sntomas generalmente pueden controlarse con tratamiento y Development worker, communityotras estrategias. Un plan de tratamiento puede incluir:  Control de la picazn y el rascado. ? Utilice antihistamnicos de venta libre segn las indicaciones, para Associate Professoraliviar la picazn. Es especialmente til por las noches cuando la picazn tiende a Theme park managerempeorar. ? Utilice medicamentos de venta libre para la picazn, segn las indicaciones del mdico. ? Evite rascarse. El rascado hace que la picazn empeore. Tambin puede producir una infeccin en la piel (imptigo) debido a las lesiones en la piel causadas por el rascado.  Mantenga la piel bien humectada con cremas, todos Maupinlos das. La piel quedar hmeda y ayudar a prevenir la sequedad. Las lociones que contengan alcohol y agua deben evitarse debido a que pueden  Best boysecar la piel.  Limite la exposicin a las cosas a las que es sensible o alrgico (alrgenos).  Reconozca las situaciones que puedan causar estrs.  Desarrolle un plan para controlar el estrs. INSTRUCCIONES PARA EL CUIDADO EN EL HOGAR  Tome slo medicamentos de venta libre o recetados, segn las indicaciones del mdico.  No aplique nada sobre la piel sin Science writerconsultar a su mdico.  Deber tomar baos o duchas de corta duracin (5 minutos) en agua tibia (no caliente). Use jabones suaves para el bao. No deben tener perfume. Puede agregar aceite de bao no perfumado al agua del bao. Es Manufacturing engineermejor evitar el jabn y el bao de espuma.  Inmediatamente despus del bao o de la ducha, cuando la piel aun est hmeda, aplique una crema humectante en todo el cuerpo. Este ungento debe ser en base a vaselina. La piel quedar hmeda y ayudar a prevenir la sequedad. Cuanto ms espeso sea el ungento, mejor. No deben tener perfume.  Mantenga las uas cortas. Es posible que los nios con eczema necesiten usar guantes o mitones por la noche, despus de aplicarse el ungento.  Vista al McGraw-Hillnio con ropa de algodn o Chief of Staffmezcla de algodn. Vstalo con ropas ligeras ya que el calor aumenta la picazn.  Un nio con eczema debe permanecer alejado de personas que tengan ampollas febriles o llagas del resfro. El virus que causa las ampollas febriles (herpes simple) puede ocasionar una infeccin grave en la piel de los nios que padecen eczema.  SOLICITE ATENCIN MDICA SI:  La picazn le impide dormir.  La erupcin empeora o no mejora dentro de la semana en la que se inicia el  Observa pus o costras amarillas en la zona de la erupcin.  Tiene fiebre.  Aparece un brote despus de haber estado en contacto con alguna persona que tiene ampollas febriles. Esta informacin no tiene como fin reemplazar el consejo del mdico. Asegrese de hacerle al mdico cualquier pregunta que tenga. Document Released:  09/19/2005 Document Revised: 07/10/2013 Document Reviewed: 04/22/2013 Elsevier Interactive Patient Education  2017 Elsevier Inc.  

## 2017-07-10 NOTE — Progress Notes (Signed)
Subjective:  Patient ID: [name], female    DOB: 1986-07-01  Age: 31 y.o. MRN: [id number]  CC: Eczema  HPI [name] is a 31 year old female who presents today for follow-up of a generalized pruritic rash for which she was seen by [facility] and diagnosed with eczema. She was placed on triamcinolone cream with improvement in the rash however she ran out of her triamcinolone and has noticed a rash resurfacing again. The rash is slightly pruritic and neck is on her anterior chest trunk but spares her extremities, face and back. She denies fever.  She complains of dysuria but no urinary frequency and has also noticed a yellowish vaginal discharge and vaginal itching. Denies abdominal pain, fever or flank pain.  Past Medical History:  Diagnosis Date  . Bulimia   . Bulimia nervosa   . Eating disorder   . GERD (gastroesophageal reflux disease)     Past Surgical History:  Procedure Laterality Date  . COLONOSCOPY WITH PROPOFOL N/A 03/18/2016   Procedure: COLONOSCOPY WITH PROPOFOL;  Surgeon: Vida Rigger, MD;  Location: [facility] ENDOSCOPY;  Service: Endoscopy;  Laterality: N/A;  . NO PAST SURGERIES      No Known Allergies   Outpatient Medications Prior to Visit  Medication Sig Dispense Refill  . cetirizine (ZYRTEC) 10 MG tablet Take 1 tablet (10 mg total) by mouth daily. 30 tablet 11  . citalopram (CELEXA) 20 MG tablet Take 1 tablet (20 mg total) by mouth daily. (Patient not taking: Reported on 02/22/2017) 30 tablet 3  . griseofulvin (GRIFULVIN V) 500 MG tablet Take 1 tablet (500 mg total) by mouth daily. For up to 4 weeks (Patient not taking: Reported on 07/10/2017) 30 tablet 0  . hydrocortisone 2.5 % cream Apply topically 2 (two) times daily. (Patient not taking: Reported on 05/22/2017) 30 g 5  . ibuprofen (ADVIL,MOTRIN) 600 MG tablet Take 1 tablet (600 mg total) by mouth every 8 (eight) hours as needed. (Patient not taking: Reported on 05/22/2017) 30  tablet 0  . prochlorperazine (COMPAZINE) 10 MG tablet Take 1 tablet (10 mg total) by mouth 2 (two) times daily as needed for nausea or vomiting. (Patient not taking: Reported on 07/10/2017) 10 tablet 0  . ranitidine (ZANTAC) 150 MG tablet Take 1 tablet (150 mg total) by mouth at bedtime. (Patient not taking: Reported on 07/10/2017) 30 tablet 0   No facility-administered medications prior to visit.     ROS Review of Systems  Constitutional: Negative for activity change, appetite change and fatigue.  HENT: Negative for congestion, sinus pressure and sore throat.   Eyes: Negative for visual disturbance.  Respiratory: Negative for cough, chest tightness, shortness of breath and wheezing.   Cardiovascular: Negative for chest pain and palpitations.  Gastrointestinal: Negative for abdominal distention, abdominal pain and constipation.  Endocrine: Negative for polydipsia.  Genitourinary: Negative for dysuria and frequency.  Musculoskeletal: Negative for arthralgias and back pain.  Skin: Negative for rash.  Neurological: Negative for tremors, light-headedness and numbness.  Hematological: Does not bruise/bleed easily.  Psychiatric/Behavioral: Negative for agitation and behavioral problems.    Objective:  BP 95/64   Pulse 87   Temp (!) 97.5 F (36.4 C) (Oral)   Ht  (1.6 m)   Wt 120 lb (54.4 kg)   SpO2 100%   BMI 21.26 kg/m   BP/Weight 07/10/2017 05/25/2017 05/25/2017  Systolic BP 95 110 100  Diastolic BP 64 70 65  Wt. (Lbs) 120 121 -  BMI 21.26 21.43 -  Physical Exam  Constitutional: She is oriented to person, place, and time. She appears well-developed and well-nourished.  Cardiovascular: Normal rate, normal heart sounds and intact distal pulses.   No murmur heard. Pulmonary/Chest: Effort normal and breath sounds normal. She has no wheezes. She has no rales. She exhibits no tenderness.  Abdominal: Soft. Bowel sounds are normal. She exhibits no distension and no mass. There  is no tenderness.  Musculoskeletal: Normal range of motion.  Neurological: She is alert and oriented to person, place, and time.  Skin:  Sparse erythematous rash on trunk     Assessment & Plan:   1. Other eczema Improving - triamcinolone cream (KENALOG) 0.1 %; Apply 1 application topically 2 (two) times daily.  Dispense: 80 g; Refill: 1  2. Dysuria Urine is negative for UTI - POCT urinalysis dipstick  3. Vaginal discharge We'll treat for bacterial vaginosis and vaginal candidiasis - fluconazole (DIFLUCAN) 150 MG tablet; Take 1 tablet (150 mg total) by mouth once.  Dispense: 1 tablet; Refill: 0 - metroNIDAZOLE (METROGEL VAGINAL) 0.75 % vaginal gel; Place 1 Applicatorful vaginally at bedtime.  Dispense: 70 g; Refill: 0  4. Need for influenza vaccination - Flu Vaccine QUAD 36+ mos IM   Meds ordered this encounter  Medications  . triamcinolone cream (KENALOG) 0.1 %    Sig: Apply 1 application topically 2 (two) times daily.    Dispense:  80 g    Refill:  1  . fluconazole (DIFLUCAN) 150 MG tablet    Sig: Take 1 tablet (150 mg total) by mouth once.    Dispense:  1 tablet    Refill:  0  . metroNIDAZOLE (METROGEL VAGINAL) 0.75 % vaginal gel    Sig: Place 1 Applicatorful vaginally at bedtime.    Dispense:  70 g    Refill:  0    Follow-up: Return in about 3 months (around 10/10/2017) for follow up of eczema.   Jaclyn Shaggy MD

## 2017-07-11 MED FILL — FLUOCINONIDE 0.05% SOLUTION: 0.05 | 15 days supply | Qty: 20 | Fill #0

## 2017-07-14 MED FILL — TRIAMCINOLONE ACETONIDE 0.1: 0.1 | 30 days supply | Qty: 454 | Fill #0

## 2017-07-24 ENCOUNTER — Ambulatory Visit: Payer: Self-pay | Attending: Family Medicine

## 2017-08-29 ENCOUNTER — Other Ambulatory Visit: Payer: Self-pay | Admitting: Gastroenterology

## 2017-08-30 MED FILL — AMOXICILLIN 500 MG CAPSULE: 500 | 7 days supply | Qty: 21 | Fill #0

## 2017-09-15 ENCOUNTER — Other Ambulatory Visit: Payer: Self-pay | Admitting: Gastroenterology

## 2017-09-15 MED FILL — AMOXICILLIN 500 MG CAPSULE: 500 | 6 days supply | Qty: 25 | Fill #0

## 2017-09-21 NOTE — Progress Notes (Signed)
Unable to reach pt by phone. Via [employer]pre[employer], [place] # [phone] left pre-op instructions on pt's voicemail.  Echo - 04/12/13 - in Epic EKG - 03/23/17 - in Epic

## 2017-09-22 ENCOUNTER — Ambulatory Visit (HOSPITAL_COMMUNITY): Payer: Self-pay | Admitting: Certified Registered Nurse Anesthetist

## 2017-09-22 ENCOUNTER — Encounter (HOSPITAL_COMMUNITY)
Admission: RE | Disposition: A | Discharge status: Home / Self Care | Payer: Self-pay | Source: Ambulatory Visit | Attending: Gastroenterology

## 2017-09-22 ENCOUNTER — Ambulatory Visit (HOSPITAL_COMMUNITY)
Admission: RE | Admit: 2017-09-22 | Discharge: 2017-09-22 | Disposition: A | Discharge status: Home / Self Care | Payer: Self-pay | Source: Ambulatory Visit | Attending: Gastroenterology | Admitting: Gastroenterology

## 2017-09-22 ENCOUNTER — Encounter (HOSPITAL_COMMUNITY): Payer: Self-pay | Admitting: Certified Registered Nurse Anesthetist

## 2017-09-22 ENCOUNTER — Other Ambulatory Visit: Payer: Self-pay

## 2017-09-22 DIAGNOSIS — F329 Major depressive disorder, single episode, unspecified: Secondary | ICD-10-CM | POA: Insufficient documentation

## 2017-09-22 DIAGNOSIS — R1013 Epigastric pain: Secondary | ICD-10-CM | POA: Insufficient documentation

## 2017-09-22 DIAGNOSIS — K210 Gastro-esophageal reflux disease with esophagitis: Secondary | ICD-10-CM | POA: Insufficient documentation

## 2017-09-22 HISTORY — PX: ESOPHAGOGASTRODUODENOSCOPY (EGD) WITH PROPOFOL: SHX5813

## 2017-09-22 SURGERY — ESOPHAGOGASTRODUODENOSCOPY (EGD) WITH PROPOFOL
Anesthesia: Monitor Anesthesia Care

## 2017-09-22 MED ORDER — SODIUM CHLORIDE 0.9 % IV SOLN: INTRAVENOUS | Status: DC

## 2017-09-22 MED ORDER — PROPOFOL 10 MG/ML IV BOLUS
INTRAVENOUS | Status: DC | PRN
  Administered 2017-09-22: 20 mg via INTRAVENOUS
  Administered 2017-09-22: 40 mg via INTRAVENOUS

## 2017-09-22 MED ORDER — PROPOFOL 500 MG/50ML IV EMUL
INTRAVENOUS | Status: DC | PRN
  Administered 2017-09-22: 50 ug/kg/min via INTRAVENOUS

## 2017-09-22 MED ORDER — LACTATED RINGERS IV SOLN
INTRAVENOUS | Status: AC | PRN
  Administered 2017-09-22: 1000 mL via INTRAVENOUS

## 2017-09-22 SURGICAL SUPPLY — 14 items

## 2017-09-22 NOTE — Progress Notes (Signed)
[name] Pooler 9:05 AM  Subjective: Patient doing well without any problems since we last saw her in the office  Objective: Vital signs stable afebrile no acute distress exam please see preassessment evaluation  Assessment: Reflux chronic abdominal pain  Plan: Okay to proceed with endoscopy with anesthesia assistance  [facility] CenterMAGOD,[name]  Pager [phone] After 5PM or if no answer call [phone]

## 2017-09-22 NOTE — Discharge Instructions (Signed)
YOU HAD AN ENDOSCOPIC PROCEDURE TODAY: Refer to the procedure report and other information in the discharge instructions given to you for any specific questions about what was found during the examination. If this information does not answer your questions, please call [facility] office at [phone] to clarify.   YOU SHOULD EXPECT: Some feelings of bloating in the abdomen. Passage of more gas than usual. Walking can help get rid of the air that was put into your GI tract during the procedure and reduce the bloating. If you had a lower endoscopy (such as a colonoscopy or flexible sigmoidoscopy) you may notice spotting of blood in your stool or on the toilet paper. Some abdominal soreness may be present for a day or two, also.  DIET: Your first meal following the procedure should be a light meal and then it is ok to progress to your normal diet. A half-sandwich or bowl of soup is an example of a good first meal. Heavy or fried foods are harder to digest and may make you feel nauseous or bloated. Drink plenty of fluids but you should avoid alcoholic beverages for 24 hours. If you had a esophageal dilation, please see attached instructions for diet.   ACTIVITY: Your care partner should take you home directly after the procedure. You should plan to take it easy, moving slowly for the rest of the day. You can resume normal activity the day after the procedure however YOU SHOULD NOT DRIVE, use power tools, machinery or perform tasks that involve climbing or major physical exertion for 24 hours (because of the sedation medicines used during the test).   SYMPTOMS TO REPORT IMMEDIATELY: A gastroenterologist can be reached at any hour. Please call [phone]  for any of the following symptoms:  Following lower endoscopy (colonoscopy, flexible sigmoidoscopy) Excessive amounts of blood in the stool  Significant tenderness, worsening of abdominal pains  Swelling of the abdomen that is new, acute  Fever of 100 or  higher  Following upper endoscopy (EGD, EUS, ERCP, esophageal dilation) Vomiting of blood or coffee ground material  New, significant abdominal pain  New, significant chest pain or pain under the shoulder blades  Painful or persistently difficult swallowing  New shortness of breath  Black, tarry-looking or red, bloody stools  FOLLOW UP:  If any biopsies were taken you will be contacted by phone or by letter within the next 1-3 weeks. Call [phone]  if you have not heard about the biopsies in 3 weeks.  Please also call with any specific questions about appointments or follow up tests. Call if question or problem otherwise follow-up in 6 months

## 2017-09-22 NOTE — Transfer of Care (Signed)
Immediate Anesthesia Transfer of Care Note  Patient: [name]  Procedure(s) Performed: ESOPHAGOGASTRODUODENOSCOPY (EGD) WITH PROPOFOL (N/A )  Patient Location: Endoscopy Unit  Anesthesia Type:MAC  Level of Consciousness: drowsy and patient cooperative  Airway & Oxygen Therapy: Patient Spontanous Breathing and Patient connected to nasal cannula oxygen  Post-op Assessment: Report given to RN, Post -op Vital signs reviewed and stable and Patient moving all extremities X 4  Post vital signs: Reviewed and stable  Last Vitals:  Vitals:   09/22/17 0828  BP: 100/66  Pulse: 83  Resp: 15  Temp: 36.8 C  SpO2: 100%    Last Pain:  Vitals:   09/22/17 0828  TempSrc: Oral         Complications: No apparent anesthesia complications

## 2017-09-22 NOTE — Anesthesia Postprocedure Evaluation (Signed)
Anesthesia Post Note  Patient: [name]  Procedure(s) Performed: ESOPHAGOGASTRODUODENOSCOPY (EGD) WITH PROPOFOL (N/A )     Patient location during evaluation: Endoscopy Anesthesia Type: MAC Level of consciousness: awake and alert, oriented and patient cooperative Pain management: pain level controlled Vital Signs Assessment: post-procedure vital signs reviewed and stable Respiratory status: spontaneous breathing, nonlabored ventilation and respiratory function stable Cardiovascular status: blood pressure returned to baseline and stable Postop Assessment: no apparent nausea or vomiting Anesthetic complications: no    Last Vitals:  Vitals:   09/22/17 0940 09/22/17 0950  BP: 109/64 115/70  Pulse: 73 75  Resp: 20 14  Temp:    SpO2: 100% 100%    Last Pain:  Vitals:   09/22/17 0928  TempSrc: Oral                 [name],E. Ela Moffat

## 2017-09-22 NOTE — Anesthesia Preprocedure Evaluation (Addendum)
Anesthesia Evaluation  Patient identified by MRN, date of birth, ID band Patient awake    Reviewed: Allergy & Precautions, NPO status , Patient's Chart, lab work & pertinent test results  Airway Mallampati: II  TM Distance: >3 FB Neck ROM: Full    Dental  (+) Dental Advisory Given   Pulmonary neg pulmonary ROS,    breath sounds clear to auscultation       Cardiovascular negative cardio ROS   Rhythm:Regular Rate:Normal  '14 ECHO: normal LVF, valves OK   Neuro/Psych Depression bulemianegative neurological ROS     GI/Hepatic Neg liver ROS, GERD  Poorly Controlled,  Endo/Other  negative endocrine ROS  Renal/GU negative Renal ROS     Musculoskeletal   Abdominal   Peds  Hematology negative hematology ROS (+)   Anesthesia Other Findings   Reproductive/Obstetrics                           Anesthesia Physical Anesthesia Plan  ASA: II  Anesthesia Plan: MAC   Post-op Pain Management:    Induction: Intravenous  PONV Risk Score and Plan: 2 and Ondansetron, Propofol infusion and Treatment may vary due to age or medical condition  Airway Management Planned: Natural Airway and Nasal Cannula  Additional Equipment:   Intra-op Plan:   Post-operative Plan:   Informed Consent: I have reviewed the patients History and Physical, chart, labs and discussed the procedure including the risks, benefits and alternatives for the proposed anesthesia with the patient or authorized representative who has indicated his/her understanding and acceptance.   Dental advisory given  Plan Discussed with: CRNA and Surgeon  Anesthesia Plan Comments: (Plan routine monitors, MAC)       Anesthesia Quick Evaluation

## 2017-09-22 NOTE — Op Note (Signed)
[facility] Patient Name: [name] Procedure Date : 09/22/2017 MRN: [id number] Attending MD: Vida Rigger , MD Date of Birth: 1986/08/06 CSN: [id number] Age: 31 Admit Type: Outpatient Procedure:                Upper GI endoscopy Indications:              Epigastric abdominal pain, For therapy of                            esophageal reflux Providers:                Vida Rigger, MD, Dwain Sarna, RN, Kandice Robinsons,                            Technician Referring MD:              Medicines:                Propofol total dose 80 mg IV Complications:            No immediate complications. Estimated Blood Loss:     Estimated blood loss: none. Procedure:                Pre-Anesthesia Assessment:                           - Prior to the procedure, a History and Physical                            was performed, and patient medications and                            allergies were reviewed. The patient's tolerance of                            previous anesthesia was also reviewed. The risks                            and benefits of the procedure and the sedation                            options and risks were discussed with the patient.                            All questions were answered, and informed consent                            was obtained. Prior Anticoagulants: The patient has                            taken no previous anticoagulant or antiplatelet                            agents. ASA Grade Assessment: I - A normal, healthy  patient. After reviewing the risks and benefits,                            the patient was deemed in satisfactory condition to                            undergo the procedure.                           After obtaining informed consent, the endoscope was                            passed under direct vision. Throughout the                            procedure, the patient's blood pressure, pulse, and                         oxygen saturations were monitored continuously. The                            was introduced through the mouth, and advanced to                            the third part of duodenum. The upper GI endoscopy                            was accomplished without difficulty. The patient                            tolerated the procedure well. Scope In: Scope Out: Findings:      The larynx was normal.      The examined esophagus was normal.      The entire examined stomach was normal.      The duodenal bulb, first portion of the duodenum, second portion of the       duodenum and third portion of the duodenum were normal.      The exam was otherwise without abnormality. Impression:               - Normal larynx.                           - Normal esophagus.                           - Normal stomach.                           - Normal duodenal bulb, first portion of the                            duodenum, second portion of the duodenum and third                            portion of the duodenum.                           -  The examination was otherwise normal.                           - No specimens collected. Moderate Sedation:      moderate sedation-none Recommendation:           - Patient has a contact number available for                            emergencies. The signs and symptoms of potential                            delayed complications were discussed with the                            patient. Return to normal activities tomorrow.                            Written discharge instructions were provided to the                            patient.                           - Soft diet today.                           - Continue present medications.                           - Return to GI clinic in 6 months.                           - Telephone GI clinic if symptomatic PRN. Procedure Code(s):        --- Professional ---                           [phone],  Esophagogastroduodenoscopy, flexible,                            transoral; diagnostic, including collection of                            specimen(s) by brushing or washing, when performed                            (separate procedure) Diagnosis Code(s):        --- Professional ---                           R10.13, Epigastric pain                           K21.9, Gastro-esophageal reflux disease without                            esophagitis CPT copyright 2016 American Medical Association. All rights reserved. The codes  documented in this report are preliminary and upon coder review may  be revised to meet current compliance requirements. Vida Rigger, MD 09/22/2017 9:31:08 AM This report has been signed electronically. Number of Addenda: 0

## 2017-09-24 ENCOUNTER — Encounter (HOSPITAL_COMMUNITY): Payer: Self-pay | Admitting: Gastroenterology

## 2017-10-02 ENCOUNTER — Encounter (HOSPITAL_COMMUNITY): Payer: Self-pay | Admitting: Emergency Medicine

## 2017-10-02 ENCOUNTER — Emergency Department (HOSPITAL_COMMUNITY)
Admission: EM | Admit: 2017-10-02 | Discharge: 2017-10-02 | Disposition: A | Discharge status: Home / Self Care | Payer: Self-pay | Attending: Emergency Medicine | Admitting: Emergency Medicine

## 2017-10-02 ENCOUNTER — Emergency Department (HOSPITAL_COMMUNITY): Payer: Self-pay

## 2017-10-02 DIAGNOSIS — R0602 Shortness of breath: Secondary | ICD-10-CM | POA: Insufficient documentation

## 2017-10-02 DIAGNOSIS — R531 Weakness: Secondary | ICD-10-CM | POA: Insufficient documentation

## 2017-10-02 DIAGNOSIS — R202 Paresthesia of skin: Secondary | ICD-10-CM | POA: Insufficient documentation

## 2017-10-02 DIAGNOSIS — R079 Chest pain, unspecified: Secondary | ICD-10-CM | POA: Insufficient documentation

## 2017-10-02 LAB — URINALYSIS, ROUTINE W REFLEX MICROSCOPIC
BILIRUBIN URINE: NEGATIVE
Bacteria, UA: NONE SEEN
Glucose, UA: NEGATIVE mg/dL
Hgb urine dipstick: NEGATIVE
Ketones, ur: NEGATIVE mg/dL
NITRITE: NEGATIVE
PH: 5 (ref 5.0–8.0)
Protein, ur: NEGATIVE mg/dL
SPECIFIC GRAVITY, URINE: 1.019 (ref 1.005–1.030)

## 2017-10-02 LAB — CBC WITH DIFFERENTIAL/PLATELET
Basophils Absolute: 0 10*3/uL (ref 0.0–0.1)
Basophils Relative: 0 %
EOS PCT: 1 %
Eosinophils Absolute: 0.1 10*3/uL (ref 0.0–0.7)
HCT: 40.3 % (ref 36.0–46.0)
Hemoglobin: 13.8 g/dL (ref 12.0–15.0)
LYMPHS ABS: 2 10*3/uL (ref 0.7–4.0)
LYMPHS PCT: 44 %
MCH: 30.6 pg (ref 26.0–34.0)
MCHC: 34.2 g/dL (ref 30.0–36.0)
MCV: 89.4 fL (ref 78.0–100.0)
MONO ABS: 0.3 10*3/uL (ref 0.1–1.0)
MONOS PCT: 6 %
Neutro Abs: 2.2 10*3/uL (ref 1.7–7.7)
Neutrophils Relative %: 49 %
PLATELETS: 360 10*3/uL (ref 150–400)
RBC: 4.51 MIL/uL (ref 3.87–5.11)
RDW: 12.8 % (ref 11.5–15.5)
WBC: 4.5 10*3/uL (ref 4.0–10.5)

## 2017-10-02 LAB — COMPREHENSIVE METABOLIC PANEL
ALT: 12 U/L — ABNORMAL LOW (ref 14–54)
AST: 20 U/L (ref 15–41)
Albumin: 4.3 g/dL (ref 3.5–5.0)
Alkaline Phosphatase: 63 U/L (ref 38–126)
Anion gap: 7 (ref 5–15)
BILIRUBIN TOTAL: 0.5 mg/dL (ref 0.3–1.2)
BUN: 6 mg/dL (ref 6–20)
CHLORIDE: 106 mmol/L (ref 101–111)
CO2: 22 mmol/L (ref 22–32)
CREATININE: 0.64 mg/dL (ref 0.44–1.00)
Calcium: 9.5 mg/dL (ref 8.9–10.3)
Glucose, Bld: 89 mg/dL (ref 65–99)
POTASSIUM: 3.8 mmol/L (ref 3.5–5.1)
Sodium: 135 mmol/L (ref 135–145)
TOTAL PROTEIN: 7.4 g/dL (ref 6.5–8.1)

## 2017-10-02 LAB — MAGNESIUM: MAGNESIUM: 2.1 mg/dL (ref 1.7–2.4)

## 2017-10-02 LAB — I-STAT TROPONIN, ED: TROPONIN I, POC: 0 ng/mL (ref 0.00–0.08)

## 2017-10-02 LAB — I-STAT BETA HCG BLOOD, ED (MC, WL, AP ONLY): I-stat hCG, quantitative: <5 m[IU]/mL (ref <5)

## 2017-10-02 LAB — POC URINE PREG, ED: PREG TEST UR: NEGATIVE

## 2017-10-02 MED ORDER — SODIUM CHLORIDE 0.9 % IV BOLUS (SEPSIS)
1000.0000 mL | Freq: Once | INTRAVENOUS | Status: AC
  Administered 2017-10-02: 1000 mL via INTRAVENOUS

## 2017-10-02 MED ORDER — LORAZEPAM 1 MG PO TABS
0.5000 mg | ORAL_TABLET | Freq: Once | ORAL | Status: AC
  Administered 2017-10-02: 0.5 mg via ORAL
  Filled 2017-10-02: qty 1

## 2017-10-02 NOTE — ED Provider Notes (Signed)
Emergency Department Provider Note   I have reviewed the triage vital signs and the nursing notes.   HISTORY  Chief Complaint Chest Pain   HPI [name] is a 31 y.o. female with history of bulimia, reflux, chest pain, arm pain, hypokalemia who presents to the emergency department today secondary to left-sided paresthesias.  Patient states that she has had a tingling in the left side of her face arm and leg for the last week.  No known instigating factors.  Nothing makes it better or makes it worse.  No history of the same.  Been eating drinking normally recently.  No alcohol drugs or tobacco.  States she has not made herself throw up in a very long time.  No weakness in those extremities.  No difficulty talking, swallowing.  She did have some fleeting back pain about a week ago and this started but none now.  No urinary symptoms.  No diarrhea or constipation. No other associated or modifying symptoms.    Past Medical History:  Diagnosis Date  . Bulimia   . Bulimia nervosa   . Eating disorder   . GERD (gastroesophageal reflux disease)     Patient Active Problem List   Diagnosis Date Noted  . Eczema 07/10/2017  . Pregnancy 04/12/2014  . Atypical chest pain 05/15/2013  . Left arm pain 05/15/2013  . Starvation ketoacidosis 04/12/2013  . Syncope 04/12/2013  . Hypokalemia 04/12/2013  . Protein-calorie malnutrition, severe (HCC) 04/12/2013  . Prolonged Q-T interval on ECG 04/12/2013  . Depression 04/16/2012  . Underweight 03/20/2012  . Bulimia nervosa 03/20/2012  . Amenorrhea, secondary 03/20/2012    Past Surgical History:  Procedure Laterality Date  . COLONOSCOPY WITH PROPOFOL N/A 03/18/2016   Procedure: COLONOSCOPY WITH PROPOFOL;  Surgeon: Vida Rigger, MD;  Location: [facility] ENDOSCOPY;  Service: Endoscopy;  Laterality: N/A;  . ESOPHAGOGASTRODUODENOSCOPY (EGD) WITH PROPOFOL N/A 09/22/2017   Procedure: ESOPHAGOGASTRODUODENOSCOPY (EGD) WITH PROPOFOL;  Surgeon: Vida Rigger, MD;  Location: [facility] ENDOSCOPY;  Service: Endoscopy;  Laterality: N/A;  . NO PAST SURGERIES      Current Outpatient Rx  . Order #: [id number] Class: Historical Med    Allergies Patient has no known allergies.  Family History  Problem Relation Age of Onset  . Hearing loss Neg Hx   . Cancer Neg Hx   . Diabetes Neg Hx   . Heart failure Neg Hx     Social History Social History   Tobacco Use  . Smoking status: Never Smoker  . Smokeless tobacco: Never Used  Substance Use Topics  . Alcohol use: No  . Drug use: No    Review of Systems  All other systems negative except as documented in the HPI. All pertinent positives and negatives as reviewed in the HPI. ____________________________________________   PHYSICAL EXAM:  VITAL SIGNS: ED Triage Vitals  Enc Vitals Group     BP 10/02/17 0659 102/76     Pulse Rate 10/02/17 0659 93     Resp 10/02/17 0659 18     Temp 10/02/17 0659 98.2 F (36.8 C)     Temp Source 10/02/17 0659 Oral     SpO2 10/02/17 0659 100 %     Weight 10/02/17 0700 120 lb (54.4 kg)     Height --      Head Circumference --      Peak Flow --      Pain Score 10/02/17 0700 8     Pain Loc --  Pain Edu? --      Excl. in GC? --     Constitutional: Alert and oriented. Well appearing and in no acute distress. Eyes: Conjunctivae are normal. PERRL. EOMI. Head: Atraumatic. Nose: No congestion/rhinnorhea. Mouth/Throat: Mucous membranes are moist.  Oropharynx non-erythematous. Neck: No stridor.  No meningeal signs.   Cardiovascular: Normal rate, regular rhythm. Good peripheral circulation. Grossly normal heart sounds.   Respiratory: Normal respiratory effort.  No retractions. Lungs CTAB. Gastrointestinal: Soft and nontender. No distention.  Musculoskeletal: No lower extremity tenderness nor edema. No gross deformities of extremities. Neurologic:  Normal speech and language. No gross focal neurologic deficits are appreciated. No altered mental status, able  to give full seemingly accurate history.  Face is symmetric, EOM's intact, pupils equal and reactive, vision intact, tongue and uvula midline without deviation. Upper and Lower extremity motor 5/5, intact pain perception in distal extremities, 2+ reflexes in biceps, patella and achilles tendons. Able to perform finger to nose normal with both hands. Walks without assistance or evident ataxia.  Skin:  Skin is warm, dry and intact. No rash noted.  ____________________________________________   LABS (all labs ordered are listed, but only abnormal results are displayed)  Labs Reviewed  COMPREHENSIVE METABOLIC PANEL - Abnormal; Notable for the following components:      Result Value   ALT 12 (*)    All other components within normal limits  URINALYSIS, ROUTINE W REFLEX MICROSCOPIC - Abnormal; Notable for the following components:   APPearance HAZY (*)    Leukocytes, UA TRACE (*)    Squamous Epithelial / LPF 6-30 (*)    All other components within normal limits  CBC WITH DIFFERENTIAL/PLATELET  MAGNESIUM  I-STAT TROPONIN, ED  I-STAT BETA HCG BLOOD, ED ([facility], [facility], [facility] ONLY)  POC URINE PREG, ED   ____________________________________________  EKG   EKG Interpretation  Date/Time:  Monday October 02 2017 06:53:13 EST Ventricular Rate:  106 PR Interval:  130 QRS Duration: 88 QT Interval:  356 QTC Calculation: 472 R Axis:   37 Text Interpretation:  Sinus tachycardia Anterior infarct , age undetermined Abnormal ECG No significant change since last tracing Confirmed by Marily MemosMesner, Valyn Latchford [phone]([id number]) on 10/02/2017 10:28:19 AM       ____________________________________________  RADIOLOGY  Dg Chest 2 View  Result Date: 10/02/2017 CLINICAL DATA:  Two day history of left-sided upper chest pain associated with shortness of breath and tingling in the left arm. Nonsmoker. History of previous episodes of syncope and atypical chest pain. EXAM: CHEST  2 VIEW COMPARISON:  Chest x-ray of June 24, 2016  FINDINGS: The lungs are well-expanded and clear. The heart and mediastinal structures are normal. There is no pleural effusion. The bony thorax exhibits no acute abnormality. IMPRESSION: There is no active cardiopulmonary disease. Electronically Signed   By: David  SwazilandJordan M.D.   On: 10/02/2017 07:49   Ct Head Wo Contrast  Result Date: 10/02/2017 CLINICAL DATA:  Left-sided weakness for 1 week EXAM: CT HEAD WITHOUT CONTRAST TECHNIQUE: Contiguous axial images were obtained from the base of the skull through the vertex without intravenous contrast. COMPARISON:  None. FINDINGS: Brain: The ventricles are normal in size and configuration. There is no intracranial mass, hemorrhage, extra-axial fluid collection, or midline shift. Gray-white compartments appear normal. No evident acute infarct. Vascular: No hyperdense vessel.  No vascular calcification evident. Skull: Bony calvarium appears intact. Sinuses/Orbits: Visualized paranasal sinuses are clear. There is leftward deviation of the nasal septum. Visualized orbits appear symmetric bilaterally. Other: Mastoid air cells are clear.  IMPRESSION: Leftward deviation nasal septum.  Study otherwise unremarkable. Electronically Signed   By: Bretta BangWilliam  Woodruff III M.D.   On: 10/02/2017 12:11    ____________________________________________   PROCEDURES  Procedure(s) performed:   Procedures   ____________________________________________   INITIAL IMPRESSION / ASSESSMENT AND PLAN / ED COURSE  Unclear etiology.  CT scan negative.  Patient with a normal neurologic exam here.  Low suspicion for stroke, multiple sclerosis, transverse myelitis or cardiac causes for her symptoms.  Will refer to the PCP and neurology for further workup and management.   Pertinent labs & imaging results that were available during my care of the patient were reviewed by me and considered in my medical decision making (see chart for  details).  ____________________________________________  FINAL CLINICAL IMPRESSION(S) / ED DIAGNOSES  Final diagnoses:  Paresthesias     MEDICATIONS GIVEN DURING THIS VISIT:  Medications  sodium chloride 0.9 % bolus 1,000 mL (0 mLs Intravenous Stopped 10/02/17 1410)  LORazepam (ATIVAN) tablet 0.5 mg (0.5 mg Oral Given 10/02/17 1300)     NEW OUTPATIENT MEDICATIONS STARTED DURING THIS VISIT:  This SmartLink is deprecated. Use AVSMEDLIST instead to display the medication list for a patient.  Note:  This note was prepared with assistance of Dragon voice recognition software. Occasional wrong-word or sound-a-like substitutions may have occurred due to the inherent limitations of voice recognition software.   Marily MemosMesner, Mihail Prettyman, MD 10/02/17 [phone]

## 2017-10-02 NOTE — ED Triage Notes (Signed)
Pt is spanish speaking, video interpretor used. Pt c/o L sided weakness,/numbness onset 1 week ago, pt states she was unable to sleep d/t numbness in face. Pt now c/o burning to left side of chest onset yesterday, intermittent shob, denies n/v, denies diaphoresis.  A & O, no neuro deficits, speech clear.

## 2017-10-10 ENCOUNTER — Encounter: Payer: Self-pay | Admitting: Family Medicine

## 2017-10-10 ENCOUNTER — Ambulatory Visit: Payer: Self-pay | Attending: Family Medicine | Admitting: Family Medicine

## 2017-10-10 VITALS — BP 96/60 | HR 72 | Temp 98.2°F | Ht 63.0 in | Wt 121.0 lb

## 2017-10-10 DIAGNOSIS — R300 Dysuria: Secondary | ICD-10-CM | POA: Insufficient documentation

## 2017-10-10 DIAGNOSIS — R202 Paresthesia of skin: Secondary | ICD-10-CM | POA: Insufficient documentation

## 2017-10-10 DIAGNOSIS — K219 Gastro-esophageal reflux disease without esophagitis: Secondary | ICD-10-CM | POA: Insufficient documentation

## 2017-10-10 DIAGNOSIS — R5383 Other fatigue: Secondary | ICD-10-CM | POA: Insufficient documentation

## 2017-10-10 DIAGNOSIS — F502 Bulimia nervosa: Secondary | ICD-10-CM | POA: Insufficient documentation

## 2017-10-10 DIAGNOSIS — J342 Deviated nasal septum: Secondary | ICD-10-CM | POA: Insufficient documentation

## 2017-10-10 LAB — POCT URINALYSIS DIPSTICK
BILIRUBIN UA: NEGATIVE
Blood, UA: NEGATIVE
GLUCOSE UA: NEGATIVE
KETONES UA: NEGATIVE
Leukocytes, UA: NEGATIVE
Nitrite, UA: NEGATIVE
Protein, UA: NEGATIVE
Urobilinogen, UA: 0.2 E.U./dL
pH, UA: 6.5 (ref 5.0–8.0)

## 2017-10-10 LAB — POCT GLYCOSYLATED HEMOGLOBIN (HGB A1C): HEMOGLOBIN A1C: 5.2

## 2017-10-10 MED ORDER — GABAPENTIN 300 MG PO CAPS: 300.0000 mg | ORAL_CAPSULE | Freq: Every day | ORAL | 3 refills | Status: DC

## 2017-10-10 MED FILL — GABAPENTIN 300 MG CAPSULE: 300 | 30 days supply | Qty: 30 | Fill #0

## 2017-10-10 NOTE — Progress Notes (Signed)
Subjective:  Patient ID: [name], female    DOB: 08/09/1986  Age: 32 y.o. MRN: [id number]  CC: Eczema   HPI [name] is a 32 year old female who presents today with complaints of left-sided paresthesia which she has had for the last 2 weeks and [facility].  Paresthesia was thought to be of unclear etiology.   Workup by means of CT head was negative and she was treated with IV normal saline, Lorazepam and subsequently discharged.  She informs me today numbness is present all the time and she has had associated generalized body aches and fatigue.  She has no known history of diabetes mellitus. She informs me that the 'muscle relaxant'  she received at the emergency room made her symptoms better and on review of her chart she received lorazepam. She denies anxiety symptoms or depression.  She also complains of dysuria and right flank pain which does not radiate.  Denies abdominal pain or fever.  Past Medical History:  Diagnosis Date  . Bulimia   . Bulimia nervosa   . Eating disorder   . GERD (gastroesophageal reflux disease)     Past Surgical History:  Procedure Laterality Date  . COLONOSCOPY WITH PROPOFOL N/A 03/18/2016   Procedure: COLONOSCOPY WITH PROPOFOL;  Surgeon: Vida RiggerMarc Magod, MD;  Location: [facility] ENDOSCOPY;  Service: Endoscopy;  Laterality: N/A;  . ESOPHAGOGASTRODUODENOSCOPY (EGD) WITH PROPOFOL N/A 09/22/2017   Procedure: ESOPHAGOGASTRODUODENOSCOPY (EGD) WITH PROPOFOL;  Surgeon: Vida RiggerMagod, Marc, MD;  [facility];  Service: Endoscopy;  Laterality: N/A;  . NO PAST SURGERIES      No Known Allergies   Outpatient Medications Prior to Visit  Medication Sig Dispense Refill  . acetaminophen (TYLENOL) 500 MG tablet Take 500-1,000 mg by mouth every 6 (six) hours as needed (for pain.).     No facility-administered medications prior to visit.     ROS Review of Systems  Constitutional: Negative for activity change, appetite  change and fatigue.  HENT: Negative for congestion, sinus pressure and sore throat.   Eyes: Negative for visual disturbance.  Respiratory: Negative for cough, chest tightness, shortness of breath and wheezing.   Cardiovascular: Negative for chest pain and palpitations.  Gastrointestinal: Negative for abdominal distention, abdominal pain and constipation.  Endocrine: Negative for polydipsia.  Genitourinary: Positive for dysuria and flank pain. Negative for frequency.  Musculoskeletal: Negative for arthralgias and back pain.  Skin: Negative for rash.  Neurological: Positive for numbness. Negative for tremors and light-headedness.  Hematological: Does not bruise/bleed easily.  Psychiatric/Behavioral: Negative for agitation and behavioral problems.    Objective:  BP 96/60   Pulse 72   Temp 98.2 F (36.8 C) (Oral)   Ht 5\' 3"  (1.6 m)   Wt 121 lb (54.9 kg)   LMP  (LMP Unknown)   SpO2 100%   BMI 21.43 kg/m   BP/Weight 10/10/2017 10/02/2017 09/22/2017  Systolic BP 96 114 115  Diastolic BP 60 53 70  Wt. (Lbs) 121 120 120  BMI 21.43 21.26 21.26      Physical Exam  Constitutional: She is oriented to person, place, and time. She appears well-developed and well-nourished.  Cardiovascular: Normal rate, normal heart sounds and intact distal pulses.  No murmur heard. Pulmonary/Chest: Effort normal and breath sounds normal. She has no wheezes. She has no rales. She exhibits no tenderness.  Abdominal: Soft. Bowel sounds are normal. She exhibits no distension and no mass. There is no tenderness.  Musculoskeletal: Normal range  of motion.  Neurological: She is alert and oriented to person, place, and time.  Skin: Skin is warm and dry.  Psychiatric: She has a normal mood and affect.    EXAM: CT HEAD WITHOUT CONTRAST  TECHNIQUE: Contiguous axial images were obtained from the base of the skull through the vertex without intravenous contrast.  COMPARISON:  None.  FINDINGS: Brain: The  ventricles are normal in size and configuration. There is no intracranial mass, hemorrhage, extra-axial fluid collection, or midline shift. Gray-white compartments appear normal. No evident acute infarct.  Vascular: No hyperdense vessel.  No vascular calcification evident.  Skull: Bony calvarium appears intact.  Sinuses/Orbits: Visualized paranasal sinuses are clear. There is leftward deviation of the nasal septum. Visualized orbits appear symmetric bilaterally.  Other: Mastoid air cells are clear.  IMPRESSION: Leftward deviation nasal septum.  Study otherwise unremarkable.   Electronically Signed   By: Bretta Bang III M.D.   On: 10/02/2017 12:11  Assessment & Plan:   1. Paresthesia Workup so far unrevealing Cannot exclude anxiety as underlying etiology - Vitamin B12 - gabapentin (NEURONTIN) 300 MG capsule; Take 1 capsule (300 mg total) by mouth at bedtime.  Dispense: 30 capsule; Refill: 3  2. Dysuria UA negative for UTI - POCT urinalysis dipstick  3. Other fatigue - TSH - VITAMIN D 25 Hydroxy (Vit-D Deficiency, Fractures) - POCT glycosylated hemoglobin (Hb A1C)   Meds ordered this encounter  Medications  . gabapentin (NEURONTIN) 300 MG capsule    Sig: Take 1 capsule (300 mg total) by mouth at bedtime.    Dispense:  30 capsule    Refill:  3    Follow-up: Return in about 1 month (around 11/10/2017) for follow up on paresthesia.   Jaclyn Shaggy MD

## 2017-10-10 NOTE — Patient Instructions (Signed)
Parestesia (Paresthesia) La parestesia es una sensacin de ardor o picazn que puede aparecer en cualquier parte del cuerpo. Suele manifestarse en las manos, los brazos, las piernas o los pies. Por lo general, no es dolorosa. En la mayora de los casos, la sensacin desaparece al poco tiempo y no es un signo de un problema grave. CUIDADOS EN EL HOGAR  Evite el consumo de alcohol.  Pruebe con masajes o acupuntura para aliviar la sensacin de malestar.  Concurra a todas las visitas de control como se lo haya indicado el mdico. Esto es importante. SOLICITE AYUDA SI:  Contina teniendo episodios de parestesia.  La sensacin de ardor o picazn empeora al caminar.  Siente dolor o tiene calambres.  Siente mareos.  Tiene una erupcin cutnea. SOLICITE AYUDA DE INMEDIATO SI:  Se siente dbil.  Tiene dificultad para caminar o moverse.  Tiene problemas para hablar, comprender o ver.  Se siente confundido.  No puede controlar la orina (miccin) ni la evacuacin de la materia fecal (defecacin).  Pierde la sensibilidad (adormecimiento) despus de una lesin.  Pierde el conocimiento (se desmaya). Esta informacin no tiene como fin reemplazar el consejo del mdico. Asegrese de hacerle al mdico cualquier pregunta que tenga. Document Released: 10/22/2010 Document Revised: 02/03/2015 Document Reviewed: 09/15/2014 Elsevier Interactive Patient Education  2018 [employer] Inc.  

## 2017-10-11 ENCOUNTER — Other Ambulatory Visit: Payer: Self-pay | Admitting: Family Medicine

## 2017-10-11 LAB — TSH: TSH: 2.21 u[IU]/mL (ref 0.450–4.500)

## 2017-10-11 LAB — VITAMIN D 25 HYDROXY (VIT D DEFICIENCY, FRACTURES): VIT D 25 HYDROXY: 19.5 ng/mL — AB (ref 30.0–100.0)

## 2017-10-11 LAB — VITAMIN B12: Vitamin B-12: 1746 pg/mL — ABNORMAL HIGH (ref 232–1245)

## 2017-10-11 MED ORDER — ERGOCALCIFEROL 1.25 MG (50000 UT) PO CAPS: 50000.0000 [IU] | ORAL_CAPSULE | ORAL | 1 refills | Status: DC

## 2017-10-12 ENCOUNTER — Telehealth: Payer: Self-pay

## 2017-10-12 NOTE — Telephone Encounter (Signed)
Pt was called and informed of lab results([id number]-[name]) 

## 2017-10-18 ENCOUNTER — Ambulatory Visit: Payer: Self-pay | Attending: Family Medicine

## 2017-10-27 ENCOUNTER — Encounter (HOSPITAL_COMMUNITY): Payer: Self-pay | Admitting: Emergency Medicine

## 2017-10-27 ENCOUNTER — Ambulatory Visit (HOSPITAL_COMMUNITY)
Admission: EM | Admit: 2017-10-27 | Discharge: 2017-10-27 | Disposition: A | Discharge status: Home / Self Care | Payer: Self-pay | Attending: Family Medicine | Admitting: Family Medicine

## 2017-10-27 DIAGNOSIS — E559 Vitamin D deficiency, unspecified: Secondary | ICD-10-CM | POA: Insufficient documentation

## 2017-10-27 DIAGNOSIS — K219 Gastro-esophageal reflux disease without esophagitis: Secondary | ICD-10-CM | POA: Insufficient documentation

## 2017-10-27 DIAGNOSIS — M549 Dorsalgia, unspecified: Secondary | ICD-10-CM | POA: Insufficient documentation

## 2017-10-27 DIAGNOSIS — R1011 Right upper quadrant pain: Secondary | ICD-10-CM | POA: Insufficient documentation

## 2017-10-27 DIAGNOSIS — F502 Bulimia nervosa: Secondary | ICD-10-CM | POA: Insufficient documentation

## 2017-10-27 LAB — HEPATIC FUNCTION PANEL
ALT: 13 U/L — ABNORMAL LOW (ref 14–54)
AST: 19 U/L (ref 15–41)
Albumin: 4.3 g/dL (ref 3.5–5.0)
Alkaline Phosphatase: 84 U/L (ref 38–126)
Total Bilirubin: 0.4 mg/dL (ref 0.3–1.2)
Total Protein: 7.5 g/dL (ref 6.5–8.1)

## 2017-10-27 LAB — POCT I-STAT, CHEM 8
BUN: 5 mg/dL — ABNORMAL LOW (ref 6–20)
Calcium, Ion: 1.25 mmol/L (ref 1.15–1.40)
Chloride: 106 mmol/L (ref 101–111)
Creatinine, Ser: 0.5 mg/dL (ref 0.44–1.00)
Glucose, Bld: 101 mg/dL — ABNORMAL HIGH (ref 65–99)
HEMATOCRIT: 43 % (ref 36.0–46.0)
HEMOGLOBIN: 14.6 g/dL (ref 12.0–15.0)
Potassium: 4.2 mmol/L (ref 3.5–5.1)
SODIUM: 140 mmol/L (ref 135–145)
TCO2: 23 mmol/L (ref 22–32)

## 2017-10-27 LAB — POCT PREGNANCY, URINE: Preg Test, Ur: NEGATIVE

## 2017-10-27 LAB — POCT URINALYSIS DIP (DEVICE)
Bilirubin Urine: NEGATIVE
Glucose, UA: NEGATIVE mg/dL
HGB URINE DIPSTICK: NEGATIVE
KETONES UR: NEGATIVE mg/dL
Nitrite: NEGATIVE
PH: 6.5 (ref 5.0–8.0)
Protein, ur: NEGATIVE mg/dL
Specific Gravity, Urine: <=1.005 (ref 1.005–1.030)
Urobilinogen, UA: 0.2 mg/dL (ref 0.0–1.0)

## 2017-10-27 LAB — LIPASE, BLOOD: LIPASE: 46 U/L (ref 11–51)

## 2017-10-27 MED ORDER — NITROFURANTOIN MONOHYD MACRO 100 MG PO CAPS: 100.0000 mg | ORAL_CAPSULE | Freq: Two times a day (BID) | ORAL | 0 refills | Status: DC

## 2017-10-27 MED ORDER — DICYCLOMINE HCL 20 MG PO TABS: 20.0000 mg | ORAL_TABLET | Freq: Three times a day (TID) | ORAL | 0 refills | Status: DC

## 2017-10-27 MED ORDER — CEPHALEXIN 500 MG PO CAPS: 500.0000 mg | ORAL_CAPSULE | Freq: Four times a day (QID) | ORAL | 0 refills | Status: AC

## 2017-10-27 MED FILL — CEPHALEXIN 500 MG CAPSULE: 500 | 7 days supply | Qty: 28 | Fill #0

## 2017-10-27 NOTE — Discharge Instructions (Addendum)
Abdominal Pain may be related to gallbladder.  We have drawn blood to check your liver and pancreas.  Please take Bentyl 4 times daily.  This may help with the pains.  Please continue daily Prilosec as well. You may continue Tylenol, avoid ibuprofen.  Please follow-up with your primary provider to have an ultrasound.  If pain worsens, you develop fever, nausea, vomiting, worsening abdominal pain please go to emergency room.  Lab tests today of your hemoglobin, electrolytes, glucose looked okay.  For dizziness please try to stay hydrated drink small frequent meals.  Controlling your pain will likely lead to resolution of dizziness.  If dizziness persistent, worsening, lasting longer, please go to emergency room.  Urine showed evidence of infection. We are treating you with Macrobid. Be sure to take full course. Stay hydrated- urine should be pale yellow to clear. My continue azo for relief of burning while infection is being cleared.   Please return or follow up with your primary provider if symptoms not improving with treatment. Please return sooner if you have worsening of symptoms or develop fever, nausea, vomiting, abdominal pain, back pain, lightheadedness, dizziness.

## 2017-10-27 NOTE — ED Provider Notes (Signed)
[facility]    CSN: [id number] Arrival date & time: 10/27/17  1021     History   Chief Complaint Chief Complaint  Patient presents with  . Abdominal Pain    HPI Kendra RidgesRosario Delapaz Shon [name] is a 32 y.o. female history of GERD and bulimia, vitamin D deficiency presenting with right upper quadrant pain and back pain.  Symptoms began 1 week ago.  States pain is a dull pain, worsens when she leans on her right side or lays on her right side.  Pain is constant but will worsen with those movements.  Denies association with eating.  Does state pain will wake her up at night sometimes.  Denies nausea, vomiting.  Denies fevers.  When the pain worsens she does have dizziness.  Feels like her vision darkens and as that she is about to pass out.  She lies down and symptoms resolved in about 5-10 minutes.  Denies dizziness in relation to sitting and standing, or turning her head in a certain position.  She does endorse urinary symptoms of burning and increased frequency.  Denies vaginal symptoms of discharge or pelvic pain.  Unsure of when her last menstrual period was, using Depo, last injection in December.  She was recently seen by her PCP and evaluated her thyroid, vitamin D level, vitamin D level, and glucose for causes of paresthesias.  All were normal except vitamin D.  Denies chest pain, does endorse shortness of breath states that she has a cold, symptoms improving from this.  HPI  Past Medical History:  Diagnosis Date  . Bulimia   . Bulimia nervosa   . Eating disorder   . GERD (gastroesophageal reflux disease)     Patient Active Problem List   Diagnosis Date Noted  . Eczema 07/10/2017  . Pregnancy 04/12/2014  . Atypical chest pain 05/15/2013  . Left arm pain 05/15/2013  . Starvation ketoacidosis 04/12/2013  . Syncope 04/12/2013  . Hypokalemia 04/12/2013  . Protein-calorie malnutrition, severe (HCC) 04/12/2013  . Prolonged Q-T interval on ECG 04/12/2013  . Depression 04/16/2012   . Underweight 03/20/2012  . Bulimia nervosa 03/20/2012  . Amenorrhea, secondary 03/20/2012    Past Surgical History:  Procedure Laterality Date  . COLONOSCOPY WITH PROPOFOL N/A 03/18/2016   Procedure: COLONOSCOPY WITH PROPOFOL;  Surgeon: Vida RiggerMarc Magod, MD;  Location: [facility];  Service: Endoscopy;  Laterality: N/A;  . ESOPHAGOGASTRODUODENOSCOPY (EGD) WITH PROPOFOL N/A 09/22/2017   Procedure: ESOPHAGOGASTRODUODENOSCOPY (EGD) WITH PROPOFOL;  Surgeon: Vida RiggerMagod, Marc, MD;  [facility];  Service: Endoscopy;  Laterality: N/A;  . NO PAST SURGERIES      OB History    Gravida Para Term Preterm AB Living   3 3 3  0 0 3   SAB TAB Ectopic Multiple Live Births   0 0 0   3       Home Medications    Prior to Admission medications   Medication Sig Start Date End Date Taking? Authorizing Provider  omeprazole (PRILOSEC) 20 MG capsule Take 20 mg by mouth daily.   Yes [provider]  acetaminophen (TYLENOL) 500 MG tablet Take 500-1,000 mg by mouth every 6 (six) hours as needed (for pain.).    [provider]  cephALEXin (KEFLEX) 500 MG capsule Take 1 capsule (500 mg total) by mouth 4 (four) times daily for 7 days. 10/27/17 11/03/17  Wieters, Hallie C, PA-C  dicyclomine (BENTYL) 20 MG tablet Take 1 tablet (20 mg total) by mouth 3 (three) times daily before meals for  7 days. 10/27/17 11/03/17  Wieters, Hallie C, PA-C  ergocalciferol (DRISDOL) 50000 units capsule Take 1 capsule (50,000 Units total) by mouth once a week. 10/11/17   [name], MD  gabapentin (NEURONTIN) 300 MG capsule Take 1 capsule (300 mg total) by mouth at bedtime. 10/10/17   [name], MD    Family History Family History  Problem Relation Age of Onset  . Hearing loss Neg Hx   . Cancer Neg Hx   . Diabetes Neg Hx   . Heart failure Neg Hx     Social History Social History   Tobacco Use  . Smoking status: Never Smoker  . Smokeless tobacco: Never Used  Substance Use Topics  . Alcohol use: No  .  Drug use: No     Allergies   Patient has no known allergies.   Review of Systems Review of Systems  Constitutional: Negative for fatigue and fever.  HENT: Positive for congestion.   Respiratory: Positive for shortness of breath.   Cardiovascular: Negative for chest pain.  Gastrointestinal: Positive for abdominal pain. Negative for blood in stool, constipation, diarrhea, nausea and vomiting.  Genitourinary: Positive for dysuria and frequency. Negative for hematuria, menstrual problem, pelvic pain and vaginal discharge.  Musculoskeletal: Positive for back pain.  Neurological: Positive for dizziness. Negative for weakness, light-headedness and headaches.     Physical Exam Triage Vital Signs ED Triage Vitals  Enc Vitals Group     BP 10/27/17 1103 106/65     Pulse Rate 10/27/17 1103 75     Resp 10/27/17 1103 20     Temp 10/27/17 1103 98.6 F (37 C)     Temp Source 10/27/17 1103 Oral     SpO2 10/27/17 1103 100 %     Weight --      Height --      Head Circumference --      Peak Flow --      Pain Score 10/27/17 1104 8     Pain Loc --      Pain Edu? --      Excl. in GC? --    Orthostatic VS for the past 24 hrs:  BP- Lying Pulse- Lying BP- Sitting Pulse- Sitting BP- Standing at 0 minutes Pulse- Standing at 0 minutes  10/27/17 1211 100/63 72 101/69 88 102/68 86    Updated Vital Signs BP 106/65 (BP Location: Left Arm)   Pulse 75   Temp 98.6 F (37 C) (Oral)   Resp 20   SpO2 100%    Physical Exam  Constitutional: She appears well-developed and well-nourished. No distress.  HENT:  Head: Normocephalic and atraumatic.  Eyes: Conjunctivae are normal.  Neck: Neck supple.  Cardiovascular: Normal rate and regular rhythm.  No murmur heard. Pulmonary/Chest: Effort normal and breath sounds normal. No respiratory distress.  Abdominal: Soft. There is tenderness in the right upper quadrant. There is positive Murphy's sign.  Bowel sounds present throughout abdomen, abdomen is  soft and nonrigid, no guarding present, tenderness to right upper quadrant, pain increased with Murphy's, but is not halted inspiration.  Negative McBurney's.  No rebound.   Musculoskeletal: She exhibits no edema.  Neurological: She is alert.  Skin: Skin is warm and dry.  Psychiatric: She has a normal mood and affect.  Nursing note and vitals reviewed.    UC Treatments / Results  Labs (all labs ordered are listed, but only abnormal results are displayed) Labs Reviewed  POCT URINALYSIS DIP (DEVICE) - Abnormal; Notable for the following  components:      Result Value   Leukocytes, UA TRACE (*)    All other components within normal limits  POCT I-STAT, CHEM 8 - Abnormal; Notable for the following components:   BUN 5 (*)    Glucose, Bld 101 (*)    All other components within normal limits  URINE CULTURE  HEPATIC FUNCTION PANEL  LIPASE, BLOOD  POCT PREGNANCY, URINE    EKG  EKG Interpretation None       Radiology No results found.  Procedures Procedures (including critical care time)  Medications Ordered in UC Medications - No data to display   Initial Impression / Assessment and Plan / UC Course  I have reviewed the triage vital signs and the nursing notes.  Pertinent labs & imaging results that were available during my care of the patient were reviewed by me and considered in my medical decision making (see chart for details).     Urine with trace leuks and symptoms we will go ahead and treat with Keflex.  Patient concerned about cost.  Will send for culture.  Abdominal pain concerning for gallbladder based of location and pain with Murphy sign.  Does not appear acute, without nausea, vomiting, fevers.  Advised to follow-up with primary doctor and have outpatient ultrasound.  Gave appropriate return precautions to include worsening pain development of fever, nausea, vomiting, worsening dizziness please go to emergency room.  Advised to continue her Prilosec, may try  Bentyl for pain.  Dizziness seems to be associated with pain, orthostatic vital signs without decrease in blood pressure, i-STAT normal.  Final Clinical Impressions(s) / UC Diagnoses   Final diagnoses:  Right upper quadrant abdominal pain    ED Discharge Orders        Ordered    dicyclomine (BENTYL) 20 MG tablet  3 times daily before meals,   Status:  Discontinued     10/27/17 1215    nitrofurantoin, macrocrystal-monohydrate, (MACROBID) 100 MG capsule  2 times daily,   Status:  Discontinued     10/27/17 1216    cephALEXin (KEFLEX) 500 MG capsule  4 times daily     10/27/17 1234    dicyclomine (BENTYL) 20 MG tablet  3 times daily before meals,   Status:  Discontinued     10/27/17 1234    dicyclomine (BENTYL) 20 MG tablet  3 times daily before meals     10/27/17 1234       Controlled Substance Prescriptions [place] Controlled Substance Registry consulted? Not Applicable   Lew Dawes, PA-C 10/27/17 1258    Wieters, [place] C, [place] 10/27/17 1259

## 2017-10-27 NOTE — ED Triage Notes (Signed)
PT C/O: RLQ pain onset 6 days that radiates to the back.... sts she's had this for 1 year ++   Sx also include dizziness  DENIES: n/v/d, fevers, urinary sx  TAKING MEDS: acetaminophen   A&O x4... NAD... Ambulatory

## 2017-10-28 LAB — URINE CULTURE: Culture: NO GROWTH

## 2017-11-08 MED FILL — TRIAMCINOLONE ACETONIDE 0.1: 0.1 | 30 days supply | Qty: 454 | Fill #1

## 2017-11-14 ENCOUNTER — Ambulatory Visit: Payer: Self-pay | Admitting: Family Medicine

## 2017-12-11 ENCOUNTER — Ambulatory Visit: Payer: Self-pay | Attending: Family Medicine

## 2018-01-10 ENCOUNTER — Other Ambulatory Visit: Payer: Self-pay

## 2018-01-10 ENCOUNTER — Ambulatory Visit: Payer: Self-pay | Attending: Family Medicine | Admitting: Physician Assistant

## 2018-01-10 VITALS — BP 89/61 | HR 62 | Temp 98.2°F | Resp 18 | Ht 63.0 in | Wt 114.6 lb

## 2018-01-10 DIAGNOSIS — Z789 Other specified health status: Secondary | ICD-10-CM

## 2018-01-10 DIAGNOSIS — K219 Gastro-esophageal reflux disease without esophagitis: Secondary | ICD-10-CM | POA: Insufficient documentation

## 2018-01-10 DIAGNOSIS — M79602 Pain in left arm: Secondary | ICD-10-CM | POA: Insufficient documentation

## 2018-01-10 DIAGNOSIS — E559 Vitamin D deficiency, unspecified: Secondary | ICD-10-CM | POA: Insufficient documentation

## 2018-01-10 DIAGNOSIS — R079 Chest pain, unspecified: Secondary | ICD-10-CM | POA: Insufficient documentation

## 2018-01-10 DIAGNOSIS — Z79899 Other long term (current) drug therapy: Secondary | ICD-10-CM | POA: Insufficient documentation

## 2018-01-10 MED ORDER — NAPROXEN 500 MG PO TABS: 500.0000 mg | ORAL_TABLET | Freq: Two times a day (BID) | ORAL | 1 refills | Status: DC

## 2018-01-10 MED FILL — NAPROXEN 500 MG TABLET: 500 | 30 days supply | Qty: 60 | Fill #0

## 2018-01-10 NOTE — Progress Notes (Signed)
Patient ID: [name], female   DOB: 04/11/1986, 32 y.o.   MRN: [id number]     [name], is a 32 y.o. female  [id number]:[id number]  [id number]:[id number]  DOB - 06/19/1986  Subjective:  Chief Complaint and HPI: [name] is a 32 y.o. female here today for 1 week h/o L chest and L arm pain.  Pain is constant and all day every day.  Fluctuates in intensity from mild to moderate.  Pain is sharp.  Notices palpitations at night sometimes.  No new/different stress.  NKI.  No diaphoresis.  No paresthesias.   No SOB.  R hand dominant.   Family history:  Brother needed Visual merchandiserpace maker at age 33.  No other heart conditions/early MI.    [employer] interpreters translating.  "[name]"  ROS:   Constitutional:  No f/c, No night sweats, No unexplained weight loss. EENT:  No vision changes, No blurry vision, No hearing changes. No mouth, throat, or ear problems.  Respiratory: No cough, No SOB Cardiac: + CP, + palpitations GI:  No abd pain, No N/V/D. GU: No Urinary s/sx Musculoskeletal: No joint pain Neuro: No headache, no dizziness, no motor weakness.  Skin: No rash Endocrine:  No polydipsia. No polyuria.  Psych: Denies SI/HI  No problems updated.  ALLERGIES: No Known Allergies  PAST MEDICAL HISTORY: Past Medical History:  Diagnosis Date  . Bulimia   . Bulimia nervosa   . Eating disorder   . GERD (gastroesophageal reflux disease)     MEDICATIONS AT HOME: Prior to Admission medications   Medication Sig Start Date End Date Taking? Authorizing Provider  omeprazole (PRILOSEC) 20 MG capsule Take 20 mg by mouth daily.   Yes [provider]  acetaminophen (TYLENOL) 500 MG tablet Take 500-1,000 mg by mouth every 6 (six) hours as needed (for pain.).    [provider]  dicyclomine (BENTYL) 20 MG tablet Take 1 tablet (20 mg total) by mouth 3 (three) times daily before meals for 7 days. 10/27/17 11/03/17  Wieters, Hallie C, PA-C  ergocalciferol (DRISDOL) 50000 units capsule Take 1  capsule (50,000 Units total) by mouth once a week. 10/11/17   Hoy RegisterNewlin, Enobong, MD  gabapentin (NEURONTIN) 300 MG capsule Take 1 capsule (300 mg total) by mouth at bedtime. 10/10/17   Hoy RegisterNewlin, Enobong, MD     Objective:  EXAM:   Vitals:   01/10/18 0944  BP: (!) 89/61  Pulse: 62  Resp: 18  Temp: 98.2 F (36.8 C)  TempSrc: Oral  SpO2: 98%  Weight: 114 lb 9.6 oz (52 kg)  Height: 5\' 3"  (1.6 m)    General appearance : A&OX3. NAD. Non-toxic-appearing (BP reviewed in Epic and her BP runs low/low normal in BP reviewed back to 2014) HEENT: Atraumatic and Normocephalic.  PERRLA. EOM intact.   Neck: supple, no JVD. No cervical lymphadenopathy. No thyromegaly Chest/Lungs:  Breathing-non-labored, Good air entry bilaterally, breath sounds normal without rales, rhonchi, or wheezing  CVS: S1 S2 regular, no murmurs, gallops, rubs.  Chest is TTP on the L.   Extremities: Bilateral Lower Ext shows no edema, both legs are warm to touch with = pulse throughout.  L arm exam is normal.  apin is worsened by musculoskeletal movement.   Neurology:  CN II-XII grossly intact, Non focal.   Psych:  TP linear. J/I WNL. Normal speech. Appropriate eye contact and affect.  Skin:  No Rash  Data Review Lab Results  Component Value Date   HGBA1C 5.2 10/10/2017     Assessment &  Plan   1. Chest pain, unspecified type Low risk factors for cardiac cause.  TSH normal recently.   EKG with NSR and no ST changes.   - Basic metabolic panel -try Naprosyn 500 bid X 1 week.    2. Vitamin D deficiency - Vitamin D, 25-hydroxy  3. Left arm pain -try Naprosyn 500 bid X 1 week.  4. Language barrier [employer] interpreters used and additional time performing visit was required.      Patient have been counseled extensively about nutrition and exercise  Return in about 2 months (around 03/12/2018) for Dr Alvis Lemmings for check up.  The patient was given clear instructions to go to ER or return to medical center if symptoms  don't improve, worsen or new problems develop. The patient verbalized understanding. The patient was told to call to get lab results if they haven't heard anything in the next week.     Georgian Co, PA-C [facility] [place], [place] [phone]   01/10/2018, 9:59 AM

## 2018-01-11 ENCOUNTER — Telehealth: Payer: Self-pay

## 2018-01-11 LAB — BASIC METABOLIC PANEL
BUN / CREAT RATIO: 8 — AB (ref 9–23)
BUN: 6 mg/dL (ref 6–20)
CHLORIDE: 104 mmol/L (ref 96–106)
CO2: 21 mmol/L (ref 20–29)
CREATININE: 0.77 mg/dL (ref 0.57–1.00)
Calcium: 9.6 mg/dL (ref 8.7–10.2)
GFR calc Af Amer: 117 mL/min/{1.73_m2} (ref >59)
GFR calc non Af Amer: 102 mL/min/{1.73_m2} (ref >59)
GLUCOSE: 87 mg/dL (ref 65–99)
Potassium: 4.8 mmol/L (ref 3.5–5.2)
SODIUM: 141 mmol/L (ref 134–144)

## 2018-01-11 LAB — VITAMIN D 25 HYDROXY (VIT D DEFICIENCY, FRACTURES): VIT D 25 HYDROXY: 24.5 ng/mL — AB (ref 30.0–100.0)

## 2018-01-11 NOTE — Telephone Encounter (Signed)
CMA call regarding lab results   Patient did not answer but left a VM stating the reason of the call & to call back  

## 2018-01-11 NOTE — Telephone Encounter (Signed)
-----   Message from Anders SimmondsAngela M McClung, [place] sent at 01/11/2018  8:30 AM EDT ----- Please call patient and let them that their vitamin D is low.  This can contribute to muscle aches, anxiety, fatigue, and depression.  It has improved some since last time we checked it but is still not normal.  She should get some over the counter vitamin D and take 2000units daily.  We will recheck this level in 3-4 months.   Thanks, Georgian CoAngela McClung, PA-C

## 2018-01-22 ENCOUNTER — Telehealth: Payer: Self-pay | Admitting: Family Medicine

## 2018-01-22 NOTE — Telephone Encounter (Signed)
Pt came in to request her lab results please follow up

## 2018-01-23 NOTE — Telephone Encounter (Signed)
Pt. Called requesting her lab results from her last OV. Please f/u with pt.

## 2018-01-24 ENCOUNTER — Telehealth: Payer: Self-pay | Admitting: Family Medicine

## 2018-01-24 NOTE — Telephone Encounter (Signed)
Pt called to get the lab result, please call her back (spanish)

## 2018-01-24 NOTE — Telephone Encounter (Signed)
CMA call patient regarding lab results  Patient verify DOB  Patient was aware and understood    

## 2018-01-25 MED FILL — TRIAMCINOLONE ACETONIDE 0.1: 0.1 | 30 days supply | Qty: 454 | Fill #0

## 2018-01-25 NOTE — Telephone Encounter (Signed)
CMA call back patient regarding she was requesting lab results   Patient did not answer but left a VM stating the reason of the call & to call me back

## 2018-02-07 ENCOUNTER — Ambulatory Visit: Payer: Self-pay | Attending: Family Medicine | Admitting: Physician Assistant

## 2018-02-07 VITALS — BP 91/60 | HR 88 | Temp 98.6°F | Resp 16 | Ht 63.0 in | Wt 115.4 lb

## 2018-02-07 DIAGNOSIS — S8011XA Contusion of right lower leg, initial encounter: Secondary | ICD-10-CM | POA: Insufficient documentation

## 2018-02-07 DIAGNOSIS — Z79899 Other long term (current) drug therapy: Secondary | ICD-10-CM | POA: Insufficient documentation

## 2018-02-07 DIAGNOSIS — S8012XA Contusion of left lower leg, initial encounter: Secondary | ICD-10-CM | POA: Insufficient documentation

## 2018-02-07 DIAGNOSIS — R350 Frequency of micturition: Secondary | ICD-10-CM

## 2018-02-07 DIAGNOSIS — N3000 Acute cystitis without hematuria: Secondary | ICD-10-CM | POA: Insufficient documentation

## 2018-02-07 DIAGNOSIS — K219 Gastro-esophageal reflux disease without esophagitis: Secondary | ICD-10-CM | POA: Insufficient documentation

## 2018-02-07 DIAGNOSIS — T148XXA Other injury of unspecified body region, initial encounter: Secondary | ICD-10-CM

## 2018-02-07 DIAGNOSIS — X58XXXA Exposure to other specified factors, initial encounter: Secondary | ICD-10-CM | POA: Insufficient documentation

## 2018-02-07 LAB — POCT URINALYSIS DIPSTICK
Bilirubin, UA: NEGATIVE
Blood, UA: NEGATIVE
GLUCOSE UA: NEGATIVE
Ketones, UA: NEGATIVE
NITRITE UA: NEGATIVE
PROTEIN UA: NEGATIVE
Spec Grav, UA: 1.01 (ref 1.010–1.025)
Urobilinogen, UA: 0.2 E.U./dL
pH, UA: 6.5 (ref 5.0–8.0)

## 2018-02-07 MED ORDER — NITROFURANTOIN MONOHYD MACRO 100 MG PO CAPS: 100.0000 mg | ORAL_CAPSULE | Freq: Two times a day (BID) | ORAL | 0 refills | Status: DC

## 2018-02-07 MED FILL — NITROFURANTOIN MONO-MCR 100: 100 | 3 days supply | Qty: 6 | Fill #0

## 2018-02-07 NOTE — Patient Instructions (Signed)
Infeccin urinaria en los adultos  (Urinary Tract Infection, Adult)  Una infeccin urinaria (IU) puede ocurrir en cualquier lugar de las vas urinarias. Las vas urinarias incluyen lo siguiente:   Riones.   Urteres.   Vejiga.   Uretra.  Estos rganos fabrican, almacenan y eliminan la orina del organismo.  CUIDADOS EN EL HOGAR   Tome los medicamentos de venta libre y los recetados solamente como se lo haya indicado el mdico.   Si le recetaron un antibitico, tmelo como se lo haya indicado el mdico. No deje de tomar los antibiticos aunque comience a sentirse mejor.   Evite beber lo siguiente:  ? Alcohol.  ? Cafena.  ? T.  ? Bebidas con gas.   Beba suficiente lquido para mantener el pis claro o de color amarillo plido.   Concurra a todas las visitas de control como se lo haya indicado el mdico. Esto es importante.   Asegrese de lo siguiente:  ? Vaciar la vejiga con frecuencia y en su totalidad. No contener la orina durante largos perodos.  ? Vaciar la vejiga antes y despus de tener relaciones sexuales.  ? Limpiar de adelante hacia atrs despus de defecar, si es mujer. Usar cada trozo de papel una vez cuando se limpie.  SOLICITE AYUDA SI:   Siente dolor en la espalda.   Tiene fiebre.   Siente malestar estomacal (nuseas).   Vomita.   Los sntomas no mejoran despus de 3das de tratamiento.   Los sntomas desaparecen y luego reaparecen.  SOLICITE AYUDA DE INMEDIATO SI:   Siente un dolor muy intenso en la espalda.   Siente un dolor muy intenso en la parte inferior del abdomen.   Tiene vmitos y no puede retener los medicamentos ni el agua.  Esta informacin no tiene como fin reemplazar el consejo del mdico. Asegrese de hacerle al mdico cualquier pregunta que tenga.  Document Released: 03/09/2010 Document Revised: 01/11/2016 Document Reviewed: 08/10/2015  Elsevier Interactive Patient Education  2018 Elsevier Inc.

## 2018-02-07 NOTE — Progress Notes (Signed)
Pt. Stated last week she had bruise on her leg and painful. Pt. Stated she still have them and have not gone away completely.   Patient stated she is having vaginal pain when she pee, urgency to pee, and discharges

## 2018-02-07 NOTE — Progress Notes (Signed)
Patient ID: [name], female   DOB: 05/15/1986, 32 y.o.   MRN: [id number]     [name], is a 32 y.o. female  WUJ:[id number]  [id number]  DOB - 02/10/1986  Subjective:  Chief Complaint and HPI: [name] is a 32 y.o. female here today  Last week she was noticing that she had bruises on her legs and they were painful.  NKI but she did have a fall onto her R just below her knee before nocticing the bruises.   No bleeding gums, nosebleeds, or other irregular bleeding  +urinary frequency and urgency X 2 weeks.  Some vaginal irritation.  No f/c.  No N/V.  No abdominal pain.   [employer] interpreters "Zareth" translating  ROS:   Constitutional:  No f/c, No night sweats, No unexplained weight loss. EENT:  No vision changes, No blurry vision, No hearing changes. No mouth, throat, or ear problems.  Respiratory: No cough, No SOB Cardiac: No CP, no palpitations GI:  No abd pain, No N/V/D. GU: + Urinary s/sx Musculoskeletal: No joint pain Neuro: No headache, no dizziness, no motor weakness.  Skin: No rash Endocrine:  No polydipsia. No polyuria.  Psych: Denies SI/HI  No problems updated.  ALLERGIES: No Known Allergies  PAST MEDICAL HISTORY: Past Medical History:  Diagnosis Date  . Bulimia   . Bulimia nervosa   . Eating disorder   . GERD (gastroesophageal reflux disease)     MEDICATIONS AT HOME: Prior to Admission medications   Medication Sig Start Date End Date Taking? Authorizing Provider  acetaminophen (TYLENOL) 500 MG tablet Take 500-1,000 mg by mouth every 6 (six) hours as needed (for pain.).    [provider]  dicyclomine (BENTYL) 20 MG tablet Take 1 tablet (20 mg total) by mouth 3 (three) times daily before meals for 7 days. 10/27/17 11/03/17  Wieters, Hallie C, PA-C  ergocalciferol (DRISDOL) 50000 units capsule Take 1 capsule (50,000 Units total) by mouth once a week. Patient not taking: Reported on 02/07/2018 10/11/17   [name], MD  gabapentin  (NEURONTIN) 300 MG capsule Take 1 capsule (300 mg total) by mouth at bedtime. Patient not taking: Reported on 02/07/2018 10/10/17   [name], MD  naproxen (NAPROSYN) 500 MG tablet Take 1 tablet (500 mg total) by mouth 2 (two) times daily with a meal. X 1 week then prn pain Patient not taking: Reported on 02/07/2018 01/10/18   Anders Simmonds, PA-C  nitrofurantoin, macrocrystal-monohydrate, (MACROBID) 100 MG capsule Take 1 capsule (100 mg total) by mouth 2 (two) times daily. 02/07/18   Anders Simmonds, PA-C  omeprazole (PRILOSEC) 20 MG capsule Take 20 mg by mouth daily.    [provider]     Objective:  EXAM:   Vitals:   02/07/18 1623  BP: 91/60  Pulse: 88  Resp: 16  Temp: 98.6 F (37 C)  TempSrc: Oral  SpO2: 98%  Weight: 115 lb 6.4 oz (52.3 kg)  Height:  (1.6 m)    General appearance : A&OX3. NAD. Non-toxic-appearing HEENT: Atraumatic and Normocephalic.  PERRLA. EOM intact.   Neck: supple, no JVD. No cervical lymphadenopathy. No thyromegaly Chest/Lungs:  Breathing-non-labored, Good air entry bilaterally, breath sounds normal without rales, rhonchi, or wheezing  CVS: S1 S2 regular, no murmurs, gallops, rubs  Extremities: Bilateral Lower Ext shows no edema, both legs are warm to touch with = pulse throughout. There are bruises on her B LL below the level of the calves.  There is a scab  just below the R knee with surrounding bruises.  Neurology:  CN II-XII grossly intact, Non focal.   Psych:  TP linear. J/I WNL. Normal speech. Appropriate eye contact and affect.  Skin:  No Rash; no petechia  Data Review Lab Results  Component Value Date   HGBA1C 5.2 10/10/2017     Assessment & Plan   1. Urinary frequency With UTI.  Increase water intake - Urinalysis Dipstick - Urine cytology ancillary only - Urine Culture - nitrofurantoin, macrocrystal-monohydrate, (MACROBID) 100 MG capsule; Take 1 capsule (100 mg total) by mouth 2 (two) times daily.  Dispense: 6  capsule; Refill: 0  2. Bruising  - CBC with Differential/Platelet  3. Acute cystitis without hematuria - Urine Culture - nitrofurantoin, macrocrystal-monohydrate, (MACROBID) 100 MG capsule; Take 1 capsule (100 mg total) by mouth 2 (two) times daily.  Dispense: 6 capsule; Refill: 0    Patient have been counseled extensively about nutrition and exercise  Return if symptoms worsen or fail to improve.  The patient was given clear instructions to go to ER or return to medical center if symptoms don't improve, worsen or new problems develop. The patient verbalized understanding. The patient was told to call to get lab results if they haven't heard anything in the next week.     Georgian Co, PA-C [facility] [place], [place] [phone]   02/07/2018, 4:43 PM

## 2018-02-08 LAB — CBC WITH DIFFERENTIAL/PLATELET
BASOS ABS: 0 10*3/uL (ref 0.0–0.2)
Basos: 0 %
EOS (ABSOLUTE): 0.4 10*3/uL (ref 0.0–0.4)
Eos: 5 %
Hematocrit: 39 % (ref 34.0–46.6)
Hemoglobin: 13.4 g/dL (ref 11.1–15.9)
IMMATURE GRANS (ABS): 0 10*3/uL (ref 0.0–0.1)
IMMATURE GRANULOCYTES: 0 %
LYMPHS: 39 %
Lymphocytes Absolute: 2.7 10*3/uL (ref 0.7–3.1)
MCH: 29.9 pg (ref 26.6–33.0)
MCHC: 34.4 g/dL (ref 31.5–35.7)
MCV: 87 fL (ref 79–97)
MONOCYTES: 7 %
Monocytes Absolute: 0.5 10*3/uL (ref 0.1–0.9)
NEUTROS PCT: 49 %
Neutrophils Absolute: 3.3 10*3/uL (ref 1.4–7.0)
PLATELETS: 350 10*3/uL (ref 150–379)
RBC: 4.48 x10E6/uL (ref 3.77–5.28)
RDW: 13.1 % (ref 12.3–15.4)
WBC: 6.9 10*3/uL (ref 3.4–10.8)

## 2018-02-09 LAB — URINE CYTOLOGY ANCILLARY ONLY
CHLAMYDIA, DNA PROBE: NEGATIVE
NEISSERIA GONORRHEA: NEGATIVE
Trichomonas: NEGATIVE

## 2018-02-10 LAB — URINE CULTURE

## 2018-02-12 ENCOUNTER — Telehealth: Payer: Self-pay

## 2018-02-12 NOTE — Telephone Encounter (Signed)
CMA attempt to call patient to inform lab results. No answer and left a VM for patient. Spanish interpreter Waldo Laine [phone] assist with the call.  If patient call back, please inform:   Please call patient-urine and vaginal tests were negative.  Follow-up as planned.  Thanks, Georgian Co, PA-C

## 2018-02-12 NOTE — Telephone Encounter (Signed)
-----   Message from Anders Simmonds, [place] sent at 02/12/2018 10:24 AM EDT ----- Please call patient-urine and vaginal tests were negative.  Follow-up as planned.  Thanks, Georgian Co, PA-C

## 2018-02-13 LAB — URINE CYTOLOGY ANCILLARY ONLY
Bacterial vaginitis: NEGATIVE
Candida vaginitis: NEGATIVE

## 2018-03-12 ENCOUNTER — Ambulatory Visit: Payer: Self-pay | Attending: Family Medicine | Admitting: Family Medicine

## 2018-03-12 ENCOUNTER — Encounter: Payer: Self-pay | Admitting: Family Medicine

## 2018-03-12 VITALS — BP 94/61 | HR 86 | Temp 98.0°F | Ht 63.0 in | Wt 115.0 lb

## 2018-03-12 DIAGNOSIS — R0609 Other forms of dyspnea: Secondary | ICD-10-CM | POA: Insufficient documentation

## 2018-03-12 DIAGNOSIS — R21 Rash and other nonspecific skin eruption: Secondary | ICD-10-CM | POA: Insufficient documentation

## 2018-03-12 DIAGNOSIS — R079 Chest pain, unspecified: Secondary | ICD-10-CM

## 2018-03-12 DIAGNOSIS — R0789 Other chest pain: Secondary | ICD-10-CM | POA: Insufficient documentation

## 2018-03-12 DIAGNOSIS — Z79899 Other long term (current) drug therapy: Secondary | ICD-10-CM | POA: Insufficient documentation

## 2018-03-12 DIAGNOSIS — K219 Gastro-esophageal reflux disease without esophagitis: Secondary | ICD-10-CM | POA: Insufficient documentation

## 2018-03-12 MED ORDER — VALACYCLOVIR HCL 1 G PO TABS: 1000.0000 mg | ORAL_TABLET | Freq: Two times a day (BID) | ORAL | 0 refills | Status: DC

## 2018-03-12 MED ORDER — NAPROXEN 500 MG PO TABS: 500.0000 mg | ORAL_TABLET | Freq: Two times a day (BID) | ORAL | 1 refills | Status: DC

## 2018-03-12 MED ORDER — HYDROCORTISONE 1 % EX OINT: 1.0000 "application " | TOPICAL_OINTMENT | Freq: Two times a day (BID) | CUTANEOUS | 0 refills | Status: DC

## 2018-03-12 MED ORDER — ALBUTEROL SULFATE HFA 108 (90 BASE) MCG/ACT IN AERS: 2.0000 | INHALATION_SPRAY | Freq: Four times a day (QID) | RESPIRATORY_TRACT | 0 refills | Status: DC | PRN

## 2018-03-12 MED FILL — NAPROXEN 500 MG TABLET: 500 | 30 days supply | Qty: 60 | Fill #0

## 2018-03-12 MED FILL — valACYclovir HCL 1 GM TABS: 1 | 14 days supply | Qty: 14 | Fill #0

## 2018-03-12 NOTE — Progress Notes (Signed)
Subjective:  Patient ID: [name], female    DOB: 12/08/1985  Age: 32 y.o. MRN: [id number]  CC: Rash   HPI [name] is a 32 year old female who presents today with a four-day history of a right ear rash which is described as painful, burning and itchy but denies discharge from the rash.  She has no rash in other body parts and denies hearing loss, paresthesia of her face or urinary symptoms. She complains of a 2-week history of left sided chest pains which radiates to her left upper extremities, associated heart pounding, dyspnea which prevents her from sleeping. She was seen for similar symptoms by the PA and placed on naproxen. EKG from 01/10/2018 was unremarkable; TSH from 10/2017 was normal She denies a history of anxiety; her chart reveals a history of bulimia nervosa, depression.  Past Medical History:  Diagnosis Date  . Bulimia   . Bulimia nervosa   . Eating disorder   . GERD (gastroesophageal reflux disease)     Past Surgical History:  Procedure Laterality Date  . COLONOSCOPY WITH PROPOFOL N/A 03/18/2016   Procedure: COLONOSCOPY WITH PROPOFOL;  Surgeon: Vida RiggerMarc Magod, MD;  Location: [facility];  Service: Endoscopy;  Laterality: N/A;  . ESOPHAGOGASTRODUODENOSCOPY (EGD) WITH PROPOFOL N/A 09/22/2017   Procedure: ESOPHAGOGASTRODUODENOSCOPY (EGD) WITH PROPOFOL;  Surgeon: Vida RiggerMagod, Marc, MD;  [facility];  Service: Endoscopy;  Laterality: N/A;  . NO PAST SURGERIES      No Known Allergies   Outpatient Medications Prior to Visit  Medication Sig Dispense Refill  . ergocalciferol (DRISDOL) 50000 units capsule Take 1 capsule (50,000 Units total) by mouth once a week. 9 capsule 1  . acetaminophen (TYLENOL) 500 MG tablet Take 500-1,000 mg by mouth every 6 (six) hours as needed (for pain.).    Marland Kitchen. dicyclomine (BENTYL) 20 MG tablet Take 1 tablet (20 mg total) by mouth 3 (three) times daily before meals for 7 days. 21 tablet 0  . gabapentin (NEURONTIN) 300 MG  capsule Take 1 capsule (300 mg total) by mouth at bedtime. (Patient not taking: Reported on 02/07/2018) 30 capsule 3  . nitrofurantoin, macrocrystal-monohydrate, (MACROBID) 100 MG capsule Take 1 capsule (100 mg total) by mouth 2 (two) times daily. (Patient not taking: Reported on 03/12/2018) 6 capsule 0  . omeprazole (PRILOSEC) 20 MG capsule Take 20 mg by mouth daily.    . naproxen (NAPROSYN) 500 MG tablet Take 1 tablet (500 mg total) by mouth 2 (two) times daily with a meal. X 1 week then prn pain (Patient not taking: Reported on 02/07/2018) 60 tablet 1   No facility-administered medications prior to visit.     ROS Review of Systems  Constitutional: Negative for activity change, appetite change and fatigue.  HENT: Negative for congestion, sinus pressure and sore throat.   Eyes: Negative for visual disturbance.  Respiratory: Negative for cough, chest tightness, shortness of breath and wheezing.   Cardiovascular: Positive for chest pain and palpitations.  Gastrointestinal: Negative for abdominal distention, abdominal pain and constipation.  Endocrine: Negative for polydipsia.  Genitourinary: Negative for dysuria and frequency.  Musculoskeletal: Negative for arthralgias and back pain.  Skin: Positive for rash.  Neurological: Negative for tremors, light-headedness and numbness.  Hematological: Does not bruise/bleed easily.  Psychiatric/Behavioral: Negative for agitation and behavioral problems.    Objective:  BP 94/61   Pulse 86   Temp 98 F (36.7 C) (Oral)   Ht 5\' 3"  (1.6 m)   Wt 115 lb (52.2 kg)   SpO2  99%   BMI 20.37 kg/m   BP/Weight 03/12/2018 02/07/2018 01/10/2018  Systolic BP 94 91 89  Diastolic BP 61 60 61  Wt. (Lbs) 115 115.4 114.6  BMI 20.37 20.44 20.3      Physical Exam  Constitutional: She is oriented to person, place, and time. She appears well-developed and well-nourished.  HENT:  Nose: Nose normal.  Mouth/Throat: Oropharyngeal exudate present.  Right ear-vesicles  noted with no discharge Left ear- absence of rash  Normal tympanic membranes bilaterally  Cardiovascular: Normal rate, normal heart sounds and intact distal pulses.  No murmur heard. Pulmonary/Chest: Effort normal and breath sounds normal. She has no wheezes. She has no rales. She exhibits no tenderness.  Abdominal: Soft. Bowel sounds are normal. She exhibits no distension and no mass. There is no tenderness.  Musculoskeletal: Normal range of motion.  Neurological: She is alert and oriented to person, place, and time.  Skin:  Vesicles on right ear with no drainage noticed.     Assessment & Plan:   1. Chest pain, unspecified type Atypical chest pain Possibly underlying costochondritis versus anxiety - naproxen (NAPROSYN) 500 MG tablet; Take 1 tablet (500 mg total) by mouth 2 (two) times daily with a meal.  Dispense: 60 tablet; Refill: 1  2. Rash We will treat as herpes zoster oticus presumptively - valACYclovir (VALTREX) 1000 MG tablet; Take 1 tablet (1,000 mg total) by mouth 2 (two) times daily.  Dispense: 14 tablet; Refill: 0 - hydrocortisone 1 % ointment; Apply 1 application topically 2 (two) times daily.  Dispense: 30 g; Refill: 0  3. Other form of dyspnea Be related to anxiety Discussed breathing techniques - albuterol (PROVENTIL HFA;VENTOLIN HFA) 108 (90 Base) MCG/ACT inhaler; Inhale 2 puffs into the lungs every 6 (six) hours as needed for wheezing or shortness of breath.  Dispense: 1 Inhaler; Refill: 0   Meds ordered this encounter  Medications  . naproxen (NAPROSYN) 500 MG tablet    Sig: Take 1 tablet (500 mg total) by mouth 2 (two) times daily with a meal.    Dispense:  60 tablet    Refill:  1  . valACYclovir (VALTREX) 1000 MG tablet    Sig: Take 1 tablet (1,000 mg total) by mouth 2 (two) times daily.    Dispense:  14 tablet    Refill:  0  . hydrocortisone 1 % ointment    Sig: Apply 1 application topically 2 (two) times daily.    Dispense:  30 g    Refill:  0  .  albuterol (PROVENTIL HFA;VENTOLIN HFA) 108 (90 Base) MCG/ACT inhaler    Sig: Inhale 2 puffs into the lungs every 6 (six) hours as needed for wheezing or shortness of breath.    Dispense:  1 Inhaler    Refill:  0    Follow-up: Return if symptoms worsen or fail to improve.   [name] MD

## 2018-03-12 NOTE — Progress Notes (Signed)
Patient has rash on ear.

## 2018-03-14 ENCOUNTER — Ambulatory Visit: Payer: Self-pay | Attending: Family Medicine

## 2018-04-09 ENCOUNTER — Encounter (HOSPITAL_COMMUNITY): Payer: Self-pay | Admitting: Emergency Medicine

## 2018-04-09 ENCOUNTER — Ambulatory Visit (HOSPITAL_COMMUNITY)
Admission: EM | Admit: 2018-04-09 | Discharge: 2018-04-09 | Disposition: A | Discharge status: Home / Self Care | Payer: Self-pay | Attending: Family Medicine | Admitting: Family Medicine

## 2018-04-09 DIAGNOSIS — R079 Chest pain, unspecified: Secondary | ICD-10-CM

## 2018-04-09 MED ORDER — ASPIRIN 81 MG PO CHEW
CHEWABLE_TABLET | ORAL | Status: AC
  Filled 2018-04-09: qty 4

## 2018-04-09 MED ORDER — ASPIRIN 325 MG PO TABS
325.0000 mg | ORAL_TABLET | Freq: Every day | ORAL | Status: DC
  Administered 2018-04-09: 325 mg via ORAL

## 2018-04-09 NOTE — ED Provider Notes (Signed)
[facility]    CSN: [id number] Arrival date & time: 04/09/18  4098     History   Chief Complaint Chief Complaint  Patient presents with  . Chest Pain    HPI Spanish interpretation via [employer] interpreter [name] is a 32 y.o. female history of GERD presenting today for evaluation of chest pain.  Patient states that last night she developed chest pain in the left side of her chest as well as left arm with associated numbness.  Pain is described as someone poking her with a knife.  It has prevented her from sleeping last night.  Also with associated fatigue and weakness, but denies associated nausea, vomiting and shortness of breath.  Patient feels like she may have slight worsening of symptoms with walking and going upstairs, but denies other aggravating factors including eating, breathing or movements.  She denies history of hypertension and diabetes.  Denies known family history of heart disease.  She has seen her PCP for this previously and been prescribed Naprosyn.  Patient states that the Naprosyn has not helped her.  States that previously her chest pains would come and go, but this 1 has persisted.  HPI  Past Medical History:  Diagnosis Date  . Bulimia   . Bulimia nervosa   . Eating disorder   . GERD (gastroesophageal reflux disease)     Patient Active Problem List   Diagnosis Date Noted  . Eczema 07/10/2017  . Pregnancy 04/12/2014  . Atypical chest pain 05/15/2013  . Left arm pain 05/15/2013  . Starvation ketoacidosis 04/12/2013  . Syncope 04/12/2013  . Hypokalemia 04/12/2013  . Protein-calorie malnutrition, severe (HCC) 04/12/2013  . Prolonged Q-T interval on ECG 04/12/2013  . Depression 04/16/2012  . Underweight 03/20/2012  . Bulimia nervosa 03/20/2012  . Amenorrhea, secondary 03/20/2012    Past Surgical History:  Procedure Laterality Date  . COLONOSCOPY WITH PROPOFOL N/A 03/18/2016   Procedure: COLONOSCOPY WITH PROPOFOL;  Surgeon: Vida Rigger, MD;  Location: [facility];  Service: Endoscopy;  Laterality: N/A;  . ESOPHAGOGASTRODUODENOSCOPY (EGD) WITH PROPOFOL N/A 09/22/2017   Procedure: ESOPHAGOGASTRODUODENOSCOPY (EGD) WITH PROPOFOL;  Surgeon: Vida Rigger, MD;  Location: [facility] END[facility]COPY;  Service: Endoscopy;  Laterality: N/A;  . NO PAST SURGERIES      OB History    Gravida  3   Para  3   Term  3   Preterm  0   AB  0   Living  3     SAB  0   TAB  0   Ectopic  0   Multiple      Live Births  3            Home Medications    Prior to Admission medications   Medication Sig Start Date End Date Taking? Authorizing Provider  dicyclomine (BENTYL) 20 MG tablet Take 1 tablet (20 mg total) by mouth 3 (three) times daily before meals for 7 days. 10/27/17 04/09/18 Yes Tae Vonada C, PA-C  ergocalciferol (DRISDOL) 50000 units capsule Take 1 capsule (50,000 Units total) by mouth once a week. 10/11/17  Yes [name], MD  gabapentin (NEURONTIN) 300 MG capsule Take 1 capsule (300 mg total) by mouth at bedtime. 10/10/17  Yes [name], MD  omeprazole (PRILOSEC) 20 MG capsule Take 20 mg by mouth daily.   Yes [provider]  valACYclovir (VALTREX) 1000 MG tablet Take 1 tablet (1,000 mg total) by mouth 2 (two) times daily. 03/12/18  Yes [name], MD  acetaminophen (TYLENOL) 500 MG tablet Take 500-1,000 mg by mouth every 6 (six) hours as needed (for pain.).    [provider]  albuterol (PROVENTIL HFA;VENTOLIN HFA) 108 (90 Base) MCG/ACT inhaler Inhale 2 puffs into the lungs every 6 (six) hours as needed for wheezing or shortness of breath. 03/12/18   [name], MD  hydrocortisone 1 % ointment Apply 1 application topically 2 (two) times daily. 03/12/18   [name], MD  naproxen (NAPROSYN) 500 MG tablet Take 1 tablet (500 mg total) by mouth 2 (two) times daily with a meal. 03/12/18   [name], MD  nitrofurantoin, macrocrystal-monohydrate, (MACROBID) 100 MG capsule Take 1 capsule  (100 mg total) by mouth 2 (two) times daily. Patient not taking: Reported on 03/12/2018 02/07/18   Anders Simmonds, PA-C    Family History Family History  Problem Relation Age of Onset  . Hearing loss Neg Hx   . Cancer Neg Hx   . Diabetes Neg Hx   . Heart failure Neg Hx     Social History Social History   Tobacco Use  . Smoking status: Never Smoker  . Smokeless tobacco: Never Used  Substance Use Topics  . Alcohol use: No  . Drug use: No     Allergies   Patient has no known allergies.   Review of Systems Review of Systems  Constitutional: Positive for fatigue. Negative for fever.  HENT: Negative for congestion, sinus pressure and sore throat.   Eyes: Negative for photophobia, pain and visual disturbance.  Respiratory: Negative for cough and shortness of breath.   Cardiovascular: Positive for chest pain. Negative for leg swelling.  Gastrointestinal: Negative for abdominal pain, nausea and vomiting.  Genitourinary: Negative for decreased urine volume and hematuria.  Musculoskeletal: Negative for myalgias, neck pain and neck stiffness.  Neurological: Positive for weakness and headaches. Negative for dizziness, syncope, facial asymmetry, speech difficulty, light-headedness and numbness.     Physical Exam Triage Vital Signs ED Triage Vitals  Enc Vitals Group     BP 04/09/18 1004 116/78     Pulse Rate 04/09/18 1004 72     Resp 04/09/18 1004 16     Temp 04/09/18 1004 98.3 F (36.8 C)     Temp Source 04/09/18 1004 Oral     SpO2 04/09/18 1004 100 %     Weight 04/09/18 1014 115 lb (52.2 kg)     Height --      Head Circumference --      Peak Flow --      Pain Score 04/09/18 1014 8     Pain Loc --      Pain Edu? --      Excl. in GC? --    No data found.  Updated Vital Signs BP 116/78 (BP Location: Left Arm)   Pulse 72   Temp 98.3 F (36.8 C) (Oral)   Resp 16   Wt 115 lb (52.2 kg)   SpO2 100%   BMI 20.37 kg/m   Visual Acuity Right Eye Distance:   Left  Eye Distance:   Bilateral Distance:    Right Eye Near:   Left Eye Near:    Bilateral Near:     Physical Exam  Constitutional: She is oriented to person, place, and time. She appears well-developed and well-nourished. No distress.  HENT:  Head: Normocephalic and atraumatic.  Mouth/Throat: Oropharynx is clear and moist.  Eyes: Pupils are equal, round, and reactive to light. Conjunctivae and EOM are normal.  Neck: Neck  supple.  Cardiovascular: Normal rate and regular rhythm.  No murmur heard. Pulmonary/Chest: Effort normal and breath sounds normal. No respiratory distress.  Breathing comfortably at rest, CTABL, no wheezing, rales or other adventitious sounds auscultated  Chest discomfort slightly reproducible with palpation of anterior left chest  Abdominal: Soft. There is no tenderness.  Musculoskeletal: She exhibits no edema.  Strength 5/5 equal bilaterally at shoulders in all directions  Neurological: She is alert and oriented to person, place, and time.  Skin: Skin is warm and dry.  Psychiatric: She has a normal mood and affect.  Nursing note and vitals reviewed.    UC Treatments / Results  Labs (all labs ordered are listed, but only abnormal results are displayed) Labs Reviewed - No data to display  EKG None  Radiology No results found.  Procedures Procedures (including critical care time)  Medications Ordered in UC Medications  aspirin tablet 325 mg (has no administration in time range)    Initial Impression / Assessment and Plan / UC Course  I have reviewed the triage vital signs and the nursing notes.  Pertinent labs & imaging results that were available during my care of the patient were reviewed by me and considered in my medical decision making (see chart for details).    EKG normal sinus rhythm, no acute signs of ischemia or infarction. Patient with active chest pain, worse than her typical chest pain episodes, will recommend patient to go to emergency  room for further evaluation, despite negative risk factors.  Vital signs stable, patient stable at discharge, no acute signs of ACS on EKG, does not need to be transported via EMS. provided aspirin 325 prior to discharge.Discussed strict return precautions. Patient verbalized understanding and is agreeable with plan.  Final Clinical Impressions(s) / UC Diagnoses   Final diagnoses:  Chest pain, unspecified type     Discharge Instructions     Please go to emergency room for further evaluation    ED Prescriptions    None     Controlled Substance Prescriptions [place] Controlled Substance Registry consulted? Not Applicable   Lew DawesWieters, Braidan Ricciardi C, [place]PA-C 04/09/18 1046

## 2018-04-09 NOTE — ED Triage Notes (Signed)
PT reports chest pain that started last night. Left sided pain with left hand numbness. Denies SOB. Reports fatigue and nervouse feeling. PT has been seen for this before and has had EKGs and was given pain pills.

## 2018-04-09 NOTE — Discharge Instructions (Signed)
Please go to emergency room for further evaluation.

## 2018-04-14 ENCOUNTER — Emergency Department (HOSPITAL_COMMUNITY)
Admission: EM | Admit: 2018-04-14 | Discharge: 2018-04-14 | Disposition: A | Discharge status: Home / Self Care | Payer: Self-pay | Attending: Emergency Medicine | Admitting: Emergency Medicine

## 2018-04-14 ENCOUNTER — Encounter (HOSPITAL_COMMUNITY): Payer: Self-pay | Admitting: Emergency Medicine

## 2018-04-14 ENCOUNTER — Emergency Department (HOSPITAL_COMMUNITY): Payer: Self-pay

## 2018-04-14 DIAGNOSIS — Z79899 Other long term (current) drug therapy: Secondary | ICD-10-CM | POA: Insufficient documentation

## 2018-04-14 DIAGNOSIS — R0789 Other chest pain: Secondary | ICD-10-CM | POA: Insufficient documentation

## 2018-04-14 LAB — CBC
HEMATOCRIT: 39.5 % (ref 36.0–46.0)
Hemoglobin: 13.3 g/dL (ref 12.0–15.0)
MCH: 30.2 pg (ref 26.0–34.0)
MCHC: 33.7 g/dL (ref 30.0–36.0)
MCV: 89.8 fL (ref 78.0–100.0)
Platelets: 325 10*3/uL (ref 150–400)
RBC: 4.4 MIL/uL (ref 3.87–5.11)
RDW: 12.3 % (ref 11.5–15.5)
WBC: 5.8 10*3/uL (ref 4.0–10.5)

## 2018-04-14 LAB — BASIC METABOLIC PANEL
ANION GAP: 9 (ref 5–15)
BUN: 7 mg/dL (ref 6–20)
CHLORIDE: 106 mmol/L (ref 98–111)
CO2: 23 mmol/L (ref 22–32)
Calcium: 9.4 mg/dL (ref 8.9–10.3)
Creatinine, Ser: 0.79 mg/dL (ref 0.44–1.00)
GLUCOSE: 100 mg/dL — AB (ref 70–99)
POTASSIUM: 4.2 mmol/L (ref 3.5–5.1)
Sodium: 138 mmol/L (ref 135–145)

## 2018-04-14 LAB — I-STAT TROPONIN, ED
Troponin i, poc: 0 ng/mL (ref 0.00–0.08)
Troponin i, poc: 0 ng/mL (ref 0.00–0.08)

## 2018-04-14 LAB — I-STAT BETA HCG BLOOD, ED (MC, WL, AP ONLY)

## 2018-04-14 LAB — D-DIMER, QUANTITATIVE: D-Dimer, Quant: <0.27 ug/mL-FEU (ref 0.00–0.50)

## 2018-04-14 MED ORDER — LIDOCAINE 5 % EX PTCH: 1.0000 | MEDICATED_PATCH | CUTANEOUS | 0 refills | Status: AC

## 2018-04-14 MED ORDER — ACETAMINOPHEN 500 MG PO TABS
1000.0000 mg | ORAL_TABLET | Freq: Once | ORAL | Status: AC
  Administered 2018-04-14: 1000 mg via ORAL
  Filled 2018-04-14: qty 2

## 2018-04-14 MED ORDER — LIDOCAINE 5 % EX PTCH
1.0000 | MEDICATED_PATCH | CUTANEOUS | Status: DC
  Administered 2018-04-14: 1 via TRANSDERMAL
  Filled 2018-04-14: qty 1

## 2018-04-14 NOTE — ED Provider Notes (Signed)
[facility] EMERGENCY DEPARTMENT Provider Note   CSN: [id number] Arrival date & time: 04/14/18  0620     History   Chief Complaint Chief Complaint  Patient presents with  . Chest Pain    HPI [name] is a 32 y.o. female with history of bulimia, GERD, chronic chest pain, is here for evaluation of chest pain.  Pain is on the left side, constant, 10/10, described as a sharp stabbing pain that radiates to her left arm.  Pain began on Monday.  Pain is worse with inspiration and walking.  Associated with tingling sensation to her left arm and hand.  Associated with some dizziness, shortness of breath.  Patient states that her current symptoms are similar to the symptoms she is had for 3 to 4 years.  States today though it has lasted several days and it never usually last this long.  Has not been able to get good night sleep due to pain.  Has been taken Tylenol without relief.  States that  her bulimia has flared up and she has been throwing up a lot in the last month.  She denies any hematemesis.  No fevers, cough, lightheadedness, syncope, nausea, abdominal pain, back pain, lower extremity swelling or calf pain.  No history of DVT/PE.  No recent prolonged immobilization, surgeries, travel, estrogen use, cancer, hemoptysis. HPI  Past Medical History:  Diagnosis Date  . Bulimia   . Bulimia nervosa   . Eating disorder   . GERD (gastroesophageal reflux disease)     Patient Active Problem List   Diagnosis Date Noted  . Eczema 07/10/2017  . Pregnancy 04/12/2014  . Atypical chest pain 05/15/2013  . Left arm pain 05/15/2013  . Starvation ketoacidosis 04/12/2013  . Syncope 04/12/2013  . Hypokalemia 04/12/2013  . Protein-calorie malnutrition, severe (HCC) 04/12/2013  . Prolonged Q-T interval on ECG 04/12/2013  . Depression 04/16/2012  . Underweight 03/20/2012  . Bulimia nervosa 03/20/2012  . Amenorrhea, secondary 03/20/2012    Past Surgical History:    Procedure Laterality Date  . COLONOSCOPY WITH PROPOFOL N/A 03/18/2016   Procedure: COLONOSCOPY WITH PROPOFOL;  Surgeon: Vida Rigger, MD;  Location: [facility] ENDOSCOPY;  Service: Endoscopy;  Laterality: N/A;  . ESOPHAGOGASTRODUODENOSCOPY (EGD) WITH PROPOFOL N/A 09/22/2017   Procedure: ESOPHAGOGASTRODUODENOSCOPY (EGD) WITH PROPOFOL;  Surgeon: Vida Rigger, MD;  Location: [facility] ENDOSCOPY;  Service: Endoscopy;  Laterality: N/A;  . NO PAST SURGERIES       OB History    Gravida  3   Para  3   Term  3   Preterm  0   AB  0   Living  3     SAB  0   TAB  0   Ectopic  0   Multiple      Live Births  3            Home Medications    Prior to Admission medications   Medication Sig Start Date End Date Taking? Authorizing Provider  acetaminophen (TYLENOL) 500 MG tablet Take 500-1,000 mg by mouth every 6 (six) hours as needed (for pain.).    [provider]  albuterol (PROVENTIL HFA;VENTOLIN HFA) 108 (90 Base) MCG/ACT inhaler Inhale 2 puffs into the lungs every 6 (six) hours as needed for wheezing or shortness of breath. 03/12/18   [name], MD  dicyclomine (BENTYL) 20 MG tablet Take 1 tablet (20 mg total) by mouth 3 (three) times daily before meals for 7 days. 10/27/17 04/09/18  Wieters,  Hallie C, PA-C  ergocalciferol (DRISDOL) 50000 units capsule Take 1 capsule (50,000 Units total) by mouth once a week. 10/11/17   Hoy RegisterNewlin, Enobong, MD  gabapentin (NEURONTIN) 300 MG capsule Take 1 capsule (300 mg total) by mouth at bedtime. 10/10/17   Hoy RegisterNewlin, Enobong, MD  hydrocortisone 1 % ointment Apply 1 application topically 2 (two) times daily. 03/12/18   Hoy RegisterNewlin, Enobong, MD  lidocaine (LIDODERM) 5 % Place 1 patch onto the skin daily for 5 days. Apply to chest. Change every 24 hours. 04/14/18 04/19/18  Liberty HandyGibbons, Claudia J, PA-C  naproxen (NAPROSYN) 500 MG tablet Take 1 tablet (500 mg total) by mouth 2 (two) times daily with a meal. 03/12/18   Hoy RegisterNewlin, Enobong, MD  nitrofurantoin,  macrocrystal-monohydrate, (MACROBID) 100 MG capsule Take 1 capsule (100 mg total) by mouth 2 (two) times daily. Patient not taking: Reported on 03/12/2018 02/07/18   Anders SimmondsMcClung, Angela M, PA-C  omeprazole (PRILOSEC) 20 MG capsule Take 20 mg by mouth daily.    [provider]  valACYclovir (VALTREX) 1000 MG tablet Take 1 tablet (1,000 mg total) by mouth 2 (two) times daily. 03/12/18   Hoy RegisterNewlin, Enobong, MD    Family History Family History  Problem Relation Age of Onset  . Hearing loss Neg Hx   . Cancer Neg Hx   . Diabetes Neg Hx   . Heart failure Neg Hx     Social History Social History   Tobacco Use  . Smoking status: Never Smoker  . Smokeless tobacco: Never Used  Substance Use Topics  . Alcohol use: No  . Drug use: No     Allergies   Patient has no known allergies.   Review of Systems Review of Systems  Respiratory: Positive for shortness of breath.   Cardiovascular: Positive for chest pain.  Neurological: Positive for dizziness.  All other systems reviewed and are negative.    Physical Exam Updated Vital Signs BP 116/81 (BP Location: Right Arm)   Pulse 83   Temp 99.1 F (37.3 C) (Oral)   Resp 18   SpO2 99%   Physical Exam  Constitutional: She appears well-developed and well-nourished.  NAD. Non toxic.   HENT:  Head: Normocephalic and atraumatic.  Nose: Nose normal.  Moist mucous membranes. Tonsils and oropharynx normal  Eyes: Conjunctivae, EOM and lids are normal.  Neck: Trachea normal and normal range of motion.  Trachea midline. No cervical adenopathy  Cardiovascular: Normal rate, regular rhythm, S1 normal, S2 normal and normal heart sounds.  Pulses:      Carotid pulses are 2+ on the right side, and 2+ on the left side.      Radial pulses are 2+ on the right side, and 2+ on the left side.       Dorsalis pedis pulses are 2+ on the right side, and 2+ on the left side.  No anterior/posterior thorax tenderness. Pain with deep inspiration and sitting up.  No pain with active ROM of upper extremities or sitting up on bed. RRR. No LE edema or calf tenderness.   Pulmonary/Chest: Effort normal and breath sounds normal. No respiratory distress. She has no decreased breath sounds. She has no rhonchi. She exhibits tenderness.  No reproducible chest wall tenderness. CP not reproducible with AROM of upper extremities. No rales or wheezing.  Abdominal: Soft. Bowel sounds are normal. There is no tenderness.  No epigastric tenderness. NTND  Musculoskeletal:  Diffuse tenderness to left arm  Neurological: She is alert. GCS eye subscore is 4. GCS verbal  subscore is 5. GCS motor subscore is 6.  Skin: Skin is warm and dry. Capillary refill takes less than 2 seconds.  No rash to chest wall  Psychiatric: She has a normal mood and affect. Her speech is normal and behavior is normal. Judgment and thought content normal. Cognition and memory are normal.     ED Treatments / Results  Labs (all labs ordered are listed, but only abnormal results are displayed) Labs Reviewed  BASIC METABOLIC PANEL - Abnormal; Notable for the following components:      Result Value   Glucose, Bld 100 (*)    All other components within normal limits  CBC  D-DIMER, QUANTITATIVE (NOT AT [facility])  I-STAT TROPONIN, ED  I-STAT BETA HCG BLOOD, ED ([facility], [facility], [facility] ONLY)  I-STAT TROPONIN, ED    EKG EKG Interpretation  Date/Time:  Saturday April 14 2018 06:27:17 EDT Ventricular Rate:  86 PR Interval:  136 QRS Duration: 90 QT Interval:  390 QTC Calculation: 466 R Axis:   78 Text Interpretation:  Normal sinus rhythm Possible Anterior infarct , age undetermined Abnormal ECG No significant change since last tracing Confirmed by Shaune Pollack [phone]) on 04/14/2018 8:15:18 AM   Radiology Dg Chest 2 View  Result Date: 04/14/2018 CLINICAL DATA:  31 year old female with chest pain. EXAM: CHEST - 2 VIEW COMPARISON:  Chest radiograph dated 10/02/2017 FINDINGS: The lungs are clear. There is no  pleural effusion or pneumothorax. Nodular appearing density over the right lower lung field most consistent with nipple shadow. The cardiac silhouette is within normal limits. No acute osseous pathology. A 5 cm linear radiopaque structure noted in the soft tissues of the left arm. IMPRESSION: No active cardiopulmonary disease. Electronically Signed   By: Elgie Collard M.D.   On: 04/14/2018 06:58    Procedures Procedures (including critical care time)  Medications Ordered in ED Medications  acetaminophen (TYLENOL) tablet 1,000 mg (has no administration in time range)  lidocaine (LIDODERM) 5 % 1 patch (has no administration in time range)     Initial Impression / Assessment and Plan / ED Course  I have reviewed the triage vital signs and the nursing notes.  Pertinent labs & imaging results that were available during my care of the patient were reviewed by me and considered in my medical decision making (see chart for details).  Clinical Course as of Apr 15 1027  Sat Apr 14, 2018  0748 EKG 12-Lead [MB]    Clinical Course User Index [MB] Vergia Alberts, Student-PA   32 year old female here with chest pain described as sharp, constant.  Radiates to the left arm with paresthesias.  In setting of increased vomiting from bulimia in the last month.  Pleuritic.  States that she has had this pain for several years and it is unchanged but lasting longer.  No pertinent risk factors such as HTN, hypercholesterolemia, DM, obesity, smoking, positive family hx, known CAD.    On exam VS are wnl. Cardiovascular and pulmonary exam benign. tender LUE. No calf asymmetry or tenderness.  CXR, EKG, troponin x 2 within normal limits.  CBC and BMP unremarkable.  PERC negative, d-dimer negative. Heart score low.  Pt was given tylenol/lidoderm in ED. Patient has ambulated and tolerated PO in ED. Given symptoms, reassuring ED work up, low risk HEART score patient will be discharged with recommendation to follow up  with cardiologist in regards to today's hospital visit. ED return preacutions given. Pt appears reliable for follow up and is agreeable to discharge.  Low suspicion for ACS/MI, PE, PNA, pericarditis, endocarditis.  Maybe related to recent forceful vomiting, she has no crepitus over her chest, SOB, and CXR normal. Clinical picture not suggestive of esophageal rupture.   Final Clinical Impressions(s) / ED Diagnoses   Final diagnoses:  Atypical chest pain    ED Discharge Orders        Ordered    lidocaine (LIDODERM) 5 %  Every 24 hours     04/14/18 1028       Jerrell Mylar 04/14/18 1028    Shaune Pollack, MD 04/15/18 1215

## 2018-04-14 NOTE — Discharge Instructions (Signed)
You were evaluated in the emergency department for chest pain.    Based on your risk factors, work up and exam you are considered low risk for major adverse cardiac events in the next 30 days.  This means you can be discharged with close follow up with cardiology for further outpatient work up.   Call cardiology as soon as possible to establish care and further discussion and work up of your symptoms on an outpatient setting  Please return to ED if: Your chest pain is worse. You have a cough that gets worse, or you cough up blood. You have severe pain in your abdomen. You have severe weakness. You faint. You have sudden, unexplained chest discomfort. You have sudden, unexplained discomfort in your arms, back, neck, or jaw. You have shortness of breath at any time. You suddenly start to sweat, or your skin gets clammy. You feel nauseous or you vomit. You suddenly feel light-headed or dizzy. Your heart begins to beat quickly, or it feels like it is skipping beats.

## 2018-04-14 NOTE — ED Notes (Signed)
Took patient to the hallway bed got patient a warm blanket patient is resting with family at bedside

## 2018-04-14 NOTE — ED Triage Notes (Addendum)
Pt presents with left sided chest pain that radiates down left arm. States she went to urgent care on Monday and was told to come to the ED, hx of the same that patient was given naproxen for in the past. Has been taking tylenol without pain relief, states pain is worse with deep inspiration.

## 2018-06-06 ENCOUNTER — Ambulatory Visit: Payer: Self-pay | Attending: Family Medicine | Admitting: Physician Assistant

## 2018-06-06 VITALS — BP 102/67 | HR 82 | Temp 98.4°F | Resp 18 | Ht 63.0 in | Wt 114.0 lb

## 2018-06-06 DIAGNOSIS — M79671 Pain in right foot: Secondary | ICD-10-CM | POA: Insufficient documentation

## 2018-06-06 DIAGNOSIS — K219 Gastro-esophageal reflux disease without esophagitis: Secondary | ICD-10-CM | POA: Insufficient documentation

## 2018-06-06 DIAGNOSIS — M79672 Pain in left foot: Secondary | ICD-10-CM | POA: Insufficient documentation

## 2018-06-06 DIAGNOSIS — Z789 Other specified health status: Secondary | ICD-10-CM

## 2018-06-06 DIAGNOSIS — M7989 Other specified soft tissue disorders: Secondary | ICD-10-CM | POA: Insufficient documentation

## 2018-06-06 DIAGNOSIS — Z79899 Other long term (current) drug therapy: Secondary | ICD-10-CM | POA: Insufficient documentation

## 2018-06-06 NOTE — Patient Instructions (Signed)
 Edema (Edema) El edema es una acumulacin anormal de lquido en los tejidos del cuerpo. En parte se debe al efecto de la gravedad que atrae el lquido a la parte ms baja del cuerpo, lo que hace que la afeccin sea ms frecuente en las piernas y los muslos (extremidades inferiores). A menudo, los pies y los tobillos se hinchan sin que haya dolor, y es ms probable que esto ocurra a medida que envejece. Tambin es frecuente en los tejidos ms blandos, por ejemplo, alrededor de los ojos. Cuando se comprime la zona afectada, el lquido puede moverse de ese lugar y es posible que quede una depresin durante unos instantes. Esta depresin se conoce como fvea. CAUSAS Hay muchas causas posibles de edema. Consumir grandes cantidades de sal y estar de pie o sentado durante mucho tiempo puede causar edema en las piernas y los tobillos. Cuando hace calor, el edema puede empeorar. Las causas mdicas frecuentes del edema incluyen:  Insuficiencia cardaca.  Enfermedad heptica.  Enfermedades renales.  Vasos sanguneos dbiles en las piernas.  Cncer.  Una lesin.  Embarazo.  Algunos medicamentos.  Obesidad. SNTOMAS Generalmente, el edema es indoloro. La piel puede parecer hinchada o tener un aspecto brilloso. DIAGNSTICO Para poder diagnosticar el edema, el mdico le har preguntas sobre sus antecedentes mdicos y realizar un examen fsico. Es posible que tenga que hacerse estudios, por ejemplo, radiografas, un electrocardiograma o anlisis de sangre para detectar la presencia de enfermedades que pueden causar edema. TRATAMIENTO El tratamiento del edema depende de la causa. Si tiene enfermedades cardacas, hepticas o renales, debe recibir el tratamiento adecuado para estas afecciones. El tratamiento general puede incluir:  Elevacin de la parte del cuerpo afectada por encima del nivel del corazn.  Compresin de la parte del cuerpo afectada. La presin que ejercen las vendas o las medias  elsticas comprimen los tejidos y empujan el lquido de regreso a los vasos sanguneos. Esto evita que el lquido entre en los tejidos.  Restriccin de la ingesta de lquido y sal.  Administracin de pldoras para orinar (diurticos). Estos medicamentos son adecuados solo para algunos tipos de edema. Extraen el lquido del organismo y aumentan la frecuencia de la miccin. De este modo, se elimina el lquido y se reduce la hinchazn, pero los diurticos pueden tener efectos secundarios. Tome los diurticos solo como le indic el mdico. INSTRUCCIONES PARA EL CUIDADO EN EL HOGAR  Mantenga la parte del cuerpo afectada por encima del nivel del corazn cuando se acueste.  No permanezca sentado quieto ni de pie durante perodos de tiempo prolongados.  No se ponga nada por debajo de las rodillas cuando se acueste.  No use ropa apretada ni ligas en los muslos.  Haga ejercicios con las piernas para que el lquido retorne a los vasos sanguneos. Esto puede ayudar a reducir la hinchazn.  Use vendas o medias elsticas para reducir la hinchazn de los tobillos segn las indicaciones del mdico.  Lleve una dieta con bajo contenido de sal para reducir el lquido si el mdico lo recomienda.  Tome los medicamentos solamente como se lo haya indicado el mdico.  SOLICITE ATENCIN MDICA SI:  El edema no responde al tratamiento.  Tiene enfermedades cardacas, hepticas o renales y observa sntomas de edema.  Tiene edema en las piernas que no mejora despus de haberlas elevado.  Aumenta de peso de manera repentina y sin motivo aparente.  SOLICITE ATENCIN MDICA DE INMEDIATO SI:  Siente falta de aire o dolor en el pecho.  No   puede respirar cuando se acuesta.  Tiene dolor, enrojecimiento o calor en las zonas hinchadas.  Tiene enfermedades cardacas, hepticas o renales y repentinamente tiene edema.  Tiene fiebre y los sntomas empeoran repentinamente.  ASEGRESE DE QUE:  Comprende estas  instrucciones.  Controlar su afeccin.  Recibir ayuda de inmediato si no mejora o si empeora.  Esta informacin no tiene como fin reemplazar el consejo del mdico. Asegrese de hacerle al mdico cualquier pregunta que tenga. Document Released: 09/19/2005 Document Revised: 01/11/2016 Document Reviewed: 07/12/2013 Elsevier Interactive Patient Education  2017 Elsevier Inc.  

## 2018-06-06 NOTE — Progress Notes (Signed)
Patient ID: [name], female   DOB: 03/24/1986, 32 y.o.   MRN: [id number]     [name], is a 32 y.o. female  LDJ:[id number]  [id number]:[id number]  DOB - 09/11/1986  Subjective:  Chief Complaint and HPI: [name] is a 32 y.o. female here today with B feet pain for several months.  Noticed B foot swelling yesterday. No orthopnea.  No SOB.  No CP. No recent travel.  Not standing more than usual.  Foot swelling had improved by this morning.    Nettie Elm with [employer].   ROS:   Constitutional:  No f/c, No night sweats, No unexplained weight loss. EENT:  No vision changes, No blurry vision, No hearing changes. No mouth, throat, or ear problems.  Respiratory: No cough, No SOB Cardiac: No CP, no palpitations GI:  No abd pain, No N/V/D. GU: No Urinary s/sx Musculoskeletal: No joint pain Neuro: No headache, no dizziness, no motor weakness.  Skin: No rash Endocrine:  No polydipsia. No polyuria.  Psych: Denies SI/HI  No problems updated.  ALLERGIES: No Known Allergies  PAST MEDICAL HISTORY: Past Medical History:  Diagnosis Date  . Bulimia   . Bulimia nervosa   . Eating disorder   . GERD (gastroesophageal reflux disease)     MEDICATIONS AT HOME: Prior to Admission medications   Medication Sig Start Date End Date Taking? Authorizing Provider  acetaminophen (TYLENOL) 500 MG tablet Take 500-1,000 mg by mouth every 6 (six) hours as needed (for pain.).   Yes [provider]  albuterol (PROVENTIL HFA;VENTOLIN HFA) 108 (90 Base) MCG/ACT inhaler Inhale 2 puffs into the lungs every 6 (six) hours as needed for wheezing or shortness of breath. 03/12/18  Yes [name], MD  ergocalciferol (DRISDOL) 50000 units capsule Take 1 capsule (50,000 Units total) by mouth once a week. 10/11/17  Yes [name], MD  gabapentin (NEURONTIN) 300 MG capsule Take 1 capsule (300 mg total) by mouth at bedtime. Patient not taking: Reported on 06/06/2018 10/10/17   [name], MD  hydrocortisone 1 % ointment Apply 1 application topically 2 (two) times daily. Patient not taking: Reported on 06/06/2018 03/12/18   [name], MD  naproxen (NAPROSYN) 500 MG tablet Take 1 tablet (500 mg total) by mouth 2 (two) times daily with a meal. Patient not taking: Reported on 06/06/2018 03/12/18   [name], MD  omeprazole (PRILOSEC) 20 MG capsule Take 20 mg by mouth daily.    [provider]  valACYclovir (VALTREX) 1000 MG tablet Take 1 tablet (1,000 mg total) by mouth 2 (two) times daily. Patient not taking: Reported on 06/06/2018 03/12/18   [name], MD     Objective:  EXAM:   Vitals:   06/06/18 1100  BP: 102/67  Pulse: 82  Resp: 18  Temp: 98.4 F (36.9 C)  TempSrc: Oral  SpO2: 100%  Weight: 114 lb (51.7 kg)  Height: 5\' 3"  (1.6 m)    General appearance : A&OX3. NAD. Non-toxic-appearing HEENT: Atraumatic and Normocephalic.  PERRLA. EOM intact.   Neck: supple, no JVD. No cervical lymphadenopathy. No thyromegaly Chest/Lungs:  Breathing-non-labored, Good air entry bilaterally, breath sounds normal without rales, rhonchi, or wheezing  CVS: S1 S2 regular, no murmurs, gallops, rubs  Extremities: Bilateral Lower Ext shows no edema, both legs are warm to touch with = pulse throughout.  No pedal edema currently Neurology:  CN II-XII grossly intact, Non focal.   Psych:  TP linear. J/I WNL. Normal speech. Appropriate eye contact and affect.  Skin:  No Rash  Data Review Lab Results  Component Value Date   HGBA1C 5.2 10/10/2017     Assessment & Plan   1. Foot swelling - TSH - Basic metabolic panel Elevate, increase water intake, reduce salt/sugar  2. Language barrier stratus interpreters used and additional time performing visit was required.      Patient have been counseled extensively about nutrition and exercise  Return in about 3 months (around 09/05/2018) for Dr Alvis Lemmings.  The patient was given clear instructions to go to ER  or return to medical center if symptoms don't improve, worsen or new problems develop. The patient verbalized understanding. The patient was told to call to get lab results if they haven't heard anything in the next week.     Georgian Co, PA-C [facility] [place], [place] [phone]   06/06/2018, 11:18 AM

## 2018-06-07 ENCOUNTER — Telehealth: Payer: Self-pay | Admitting: Family Medicine

## 2018-06-07 LAB — BASIC METABOLIC PANEL
BUN/Creatinine Ratio: 15 (ref 9–23)
BUN: 9 mg/dL (ref 6–20)
CALCIUM: 9.4 mg/dL (ref 8.7–10.2)
CHLORIDE: 103 mmol/L (ref 96–106)
CO2: 22 mmol/L (ref 20–29)
Creatinine, Ser: 0.59 mg/dL (ref 0.57–1.00)
GFR calc Af Amer: 139 mL/min/{1.73_m2} (ref >59)
GFR calc non Af Amer: 121 mL/min/{1.73_m2} (ref >59)
Glucose: 84 mg/dL (ref 65–99)
POTASSIUM: 4.3 mmol/L (ref 3.5–5.2)
Sodium: 141 mmol/L (ref 134–144)

## 2018-06-07 LAB — TSH: TSH: 2.65 u[IU]/mL (ref 0.450–4.500)

## 2018-06-07 NOTE — Telephone Encounter (Signed)
I called Pt to inform her the CAFA is too soon to renew since exp 09/12/18, need to call back in December to reschedule appt

## 2018-06-11 ENCOUNTER — Ambulatory Visit: Payer: Self-pay

## 2018-06-11 ENCOUNTER — Ambulatory Visit: Payer: Self-pay | Attending: Family Medicine

## 2018-06-12 ENCOUNTER — Ambulatory Visit: Payer: Self-pay

## 2018-06-12 ENCOUNTER — Telehealth: Payer: Self-pay | Admitting: *Deleted

## 2018-06-12 NOTE — Telephone Encounter (Signed)
Medical Assistant used [employer] to contact patient.  Interpreter Name: Alfonse Ras #: [phone] Patient is aware of thyroid, kidney and electrolytes being normal. Patient is not diabetic and advised to limit sugar intake and increase water, fruit and veggie intake. Patient is reminded to use instructions for swelling.

## 2018-06-12 NOTE — Telephone Encounter (Signed)
-----   Message from Anders Simmonds, [place] sent at 06/07/2018  8:53 AM EDT ----- Please call patient.  Thyroid is normal, no diabetes, kidney function is good, electrolytes are normal.  Eat a healthy diet with vegetables, fruit, and protein, and increase water intake.  Limit salt/sugar.  Follow instructions on handout I gave her and elevate legs/feet if they swell again.  Thanks, Georgian Co, PA-C

## 2018-06-14 ENCOUNTER — Ambulatory Visit: Payer: Self-pay | Attending: Family Medicine | Admitting: Physician Assistant

## 2018-06-14 VITALS — BP 102/67 | HR 82 | Temp 98.3°F | Resp 18 | Ht 63.0 in | Wt 113.0 lb

## 2018-06-14 DIAGNOSIS — Z791 Long term (current) use of non-steroidal anti-inflammatories (NSAID): Secondary | ICD-10-CM | POA: Insufficient documentation

## 2018-06-14 DIAGNOSIS — N6452 Nipple discharge: Secondary | ICD-10-CM | POA: Insufficient documentation

## 2018-06-14 DIAGNOSIS — K219 Gastro-esophageal reflux disease without esophagitis: Secondary | ICD-10-CM | POA: Insufficient documentation

## 2018-06-14 DIAGNOSIS — N6320 Unspecified lump in the left breast, unspecified quadrant: Secondary | ICD-10-CM | POA: Insufficient documentation

## 2018-06-14 DIAGNOSIS — Z79899 Other long term (current) drug therapy: Secondary | ICD-10-CM | POA: Insufficient documentation

## 2018-06-14 DIAGNOSIS — Z789 Other specified health status: Secondary | ICD-10-CM

## 2018-06-14 LAB — POCT URINE PREGNANCY: PREG TEST UR: NEGATIVE

## 2018-06-14 NOTE — Progress Notes (Signed)
Patient ID: [name], female   DOB: 13-Aug-1986, 32 y.o.   MRN: [id number]    [name], is a 32 y.o. female  XLK:[id number]  [id number]:[id number]  DOB - 03/25/1986  Subjective:  Chief Complaint and HPI: [name] is a 32 y.o. female here today with Discharge and small lump from L breast, yesterday she squeezed it and some "pus" came out. She first noticed the lump and it felt firm 1 week ago.  Yesterday she was massaging it and yellowish/greensih pus came out of it.  It felt soft afterwards.  No fever.  No erythema.  nexplanon for [facility].  No FH breast CA.  Last pregnancy was about 4 years ago.  breastfed for about 1 year.   Clydie Braun with [employer].    ROS:   Constitutional:  No f/c, No night sweats, No unexplained weight loss. EENT:  No vision changes, No blurry vision, No hearing changes. No mouth, throat, or ear problems.  Respiratory: No cough, No SOB Cardiac: No CP, no palpitations GI:  No abd pain, No N/V/D. GU: No Urinary s/sx Musculoskeletal: No joint pain Neuro: No headache, no dizziness, no motor weakness.  Skin: No rash Endocrine:  No polydipsia. No polyuria.  Psych: Denies SI/HI  No problems updated.  ALLERGIES: No Known Allergies  PAST MEDICAL HISTORY: Past Medical History:  Diagnosis Date  . Bulimia   . Bulimia nervosa   . Eating disorder   . GERD (gastroesophageal reflux disease)     MEDICATIONS AT HOME: Prior to Admission medications   Medication Sig Start Date End Date Taking? Authorizing Provider  acetaminophen (TYLENOL) 500 MG tablet Take 500-1,000 mg by mouth every 6 (six) hours as needed (for pain.).   Yes [provider]  albuterol (PROVENTIL HFA;VENTOLIN HFA) 108 (90 Base) MCG/ACT inhaler Inhale 2 puffs into the lungs every 6 (six) hours as needed for wheezing or shortness of breath. 03/12/18  Yes [name], MD  ergocalciferol (DRISDOL) 50000 units capsule Take 1 capsule (50,000 Units total) by mouth once a week. 10/11/17   Yes [name], MD  naproxen (NAPROSYN) 500 MG tablet Take 1 tablet (500 mg total) by mouth 2 (two) times daily with a meal. 03/12/18  Yes Newlin, Enobong, MD  omeprazole (PRILOSEC) 20 MG capsule Take 20 mg by mouth daily.   Yes [provider]  gabapentin (NEURONTIN) 300 MG capsule Take 1 capsule (300 mg total) by mouth at bedtime. Patient not taking: Reported on 06/06/2018 10/10/17   [name], MD  hydrocortisone 1 % ointment Apply 1 application topically 2 (two) times daily. Patient not taking: Reported on 06/06/2018 03/12/18   [name], MD  valACYclovir (VALTREX) 1000 MG tablet Take 1 tablet (1,000 mg total) by mouth 2 (two) times daily. Patient not taking: Reported on 06/06/2018 03/12/18   [name], MD     Objective:  EXAM:   Vitals:   06/14/18 0921  BP: 102/67  Pulse: 82  Resp: 18  Temp: 98.3 F (36.8 C)  TempSrc: Oral  SpO2: 100%  Weight: 113 lb (51.3 kg)  Height: 5\' 3"  (1.6 m)    General appearance : A&OX3. NAD. Non-toxic-appearing HEENT: Atraumatic and Normocephalic.  PERRLA. EOM intact.  Neck: supple, no JVD. No cervical lymphadenopathy. No thyromegaly Chest/Lungs:  Breathing-non-labored, Good air entry bilaterally, breath sounds normal without rales, rhonchi, or wheezing  CVS: S1 S2 regular, no murmurs, gallops, rubs  B breasts examined.  No skin changes.  No palpable lump.  No axillary nodes.  No nipple discharge expressed today.    Extremities: Bilateral Lower Ext shows no edema, both legs are warm to touch with = pulse throughout Neurology:  CN II-XII grossly intact, Non focal.   Psych:  TP linear. J/I WNL. Normal speech. Appropriate eye contact and affect.  Skin:  No Rash  Data Review Lab Results  Component Value Date   HGBA1C 5.2 10/10/2017     Assessment & Plan   1. Nipple discharge Likely sebaceous cyst/pustule/slef-limiting with no sign of infection today - POCT urine pregnancy - US BREAST LTD UNI LEFT INC AXILLA; Future -  Prolactin  2. Language barrier stratus interpreters used and additional time performing visit was required.      Patient have been counseled extensively about nutrition and exercise  Return for keep 09/06/2018 with Dr Alvis LemmingsNewlin.  The patient was given clear instructions to go to ER or return to medical center if symptoms don't improve, worsen or new problems develop. The patient verbalized understanding. The patient was told to call to get lab results if they haven't heard anything in the next week.     Georgian CoAngela McClung, PA-C [facility] [place] [place], [place] [phone]   06/14/2018, 9:33 AM

## 2018-06-15 ENCOUNTER — Telehealth: Payer: Self-pay | Admitting: *Deleted

## 2018-06-15 LAB — PROLACTIN: Prolactin: 8.5 ng/mL (ref 4.8–23.3)

## 2018-06-15 NOTE — Telephone Encounter (Signed)
Medical Assistant used [employer] to contact patient.  Interpreter Name: Hilma FavorsJose Interpreter #:[id number] Patient verified DOB Patient is aware of blood work regarding nipple discharge being normal. Patient advised to proceed with the US. No further questions.

## 2018-06-15 NOTE — Telephone Encounter (Signed)
-----   Message from Anders SimmondsAngela M McClung, [place]PA-C sent at 06/15/2018  6:07 AM EDT ----- Please call patient.  He lab work related to nipple discharge was normal.  Proceed with ultrasound as ordered.  Thanks, Georgian CoAngela McClung, PA-C

## 2018-06-18 ENCOUNTER — Other Ambulatory Visit (HOSPITAL_COMMUNITY): Payer: Self-pay | Admitting: *Deleted

## 2018-06-18 DIAGNOSIS — N6320 Unspecified lump in the left breast, unspecified quadrant: Secondary | ICD-10-CM

## 2018-07-05 ENCOUNTER — Ambulatory Visit
Admission: RE | Admit: 2018-07-05 | Discharge: 2018-07-05 | Disposition: A | Discharge status: Home / Self Care | Payer: PRIVATE HEALTH INSURANCE | Source: Ambulatory Visit | Attending: Obstetrics and Gynecology | Admitting: Obstetrics and Gynecology

## 2018-07-05 ENCOUNTER — Ambulatory Visit (HOSPITAL_COMMUNITY)
Admission: RE | Admit: 2018-07-05 | Discharge: 2018-07-05 | Disposition: A | Discharge status: Home / Self Care | Payer: Self-pay | Source: Ambulatory Visit | Attending: Obstetrics and Gynecology | Admitting: Obstetrics and Gynecology

## 2018-07-05 ENCOUNTER — Encounter (HOSPITAL_COMMUNITY): Payer: Self-pay

## 2018-07-05 VITALS — BP 102/64 | Wt 111.6 lb

## 2018-07-05 DIAGNOSIS — N6452 Nipple discharge: Secondary | ICD-10-CM

## 2018-07-05 DIAGNOSIS — N6320 Unspecified lump in the left breast, unspecified quadrant: Secondary | ICD-10-CM

## 2018-07-05 DIAGNOSIS — Z1239 Encounter for other screening for malignant neoplasm of breast: Secondary | ICD-10-CM

## 2018-07-05 NOTE — Patient Instructions (Signed)
Explained breast self awareness with [name]. Patient did not need a Pap smear today due to last Pap smear was in February 2019 per patient. Let her know BCCCP will cover Pap smears every 3 years unless has a history of abnormal Pap smears. Referred patient to the [facility] for a diagnostic mammogram and possible left breast ultrasound. Appointment scheduled for Thursday, July 05, 2018 at 1010. [name] verbalized understanding.  Laverda Stribling, Kathaleen Maser, RN 10:30 AM

## 2018-07-05 NOTE — Progress Notes (Signed)
Complaints of a left breast lump x 2 years that has increased in size over the past two weeks. Patient complained of left breast yellowish colored discharge when expressed and pain that comes and goes. Patient rates the pain at a 3 out of 10.  Pap Smear: Pap smear not completed today. Last Pap smear was in February 2019 at the [facility] and normal per patient. Per patient has no history of an abnormal Pap smear. No Pap smear results are in Epic.  Physical exam: Breasts Breasts symmetrical. No skin abnormalities bilateral breasts. No nipple retraction bilateral breasts. No nipple discharge right breast. Expressed a milky discharge from the left breast on exam. Sample of discharge sent to cytology for evaluation. No lymphadenopathy. No lumps palpated bilateral breasts. Unable to palpate a lump in patients area of concern. No complaints of pain or tenderness on exam. Referred patient to the [facility] for a diagnostic mammogram and possible left breast ultrasound. Appointment scheduled for Thursday, July 05, 2018 at 1010.        Pelvic/Bimanual No Pap smear completed today since last Pap smear was in February 2019 per patient. Pap smear not indicated per BCCCP guidelines.   Smoking History: Patient has never smoked.  Patient Navigation: Patient education provided. Access to services provided for patient through [facility] program. Spanish interpreter provided.   Breast and Cervical Cancer Risk Assessment: Patient has no family history of breast cancer, known genetic mutations, or radiation treatment to the chest before age 36. Patient has no history of cervical dysplasia, immunocompromised, or DES exposure in-utero. Breast Cancer risk assessment completed. Unable to calculate risk due to patient is less than 62 years old.  Used Spanish interpreter [employer] from [place].

## 2018-07-24 ENCOUNTER — Telehealth (HOSPITAL_COMMUNITY): Payer: Self-pay | Admitting: *Deleted

## 2018-07-24 NOTE — Telephone Encounter (Signed)
Called patient with Spanish interpreter [name] to let her know the result to her breast cytology. Let patient know that the result is negative. Patient verbalized understanding.

## 2018-08-01 ENCOUNTER — Encounter (HOSPITAL_COMMUNITY): Payer: Self-pay | Admitting: *Deleted

## 2018-09-06 ENCOUNTER — Encounter: Payer: Self-pay | Admitting: Family Medicine

## 2018-09-06 ENCOUNTER — Ambulatory Visit: Payer: Self-pay | Attending: Family Medicine | Admitting: Family Medicine

## 2018-09-06 VITALS — BP 96/64 | HR 68 | Temp 97.8°F | Ht 63.0 in | Wt 115.2 lb

## 2018-09-06 DIAGNOSIS — R300 Dysuria: Secondary | ICD-10-CM | POA: Insufficient documentation

## 2018-09-06 DIAGNOSIS — N898 Other specified noninflammatory disorders of vagina: Secondary | ICD-10-CM | POA: Insufficient documentation

## 2018-09-06 DIAGNOSIS — Z79899 Other long term (current) drug therapy: Secondary | ICD-10-CM | POA: Insufficient documentation

## 2018-09-06 DIAGNOSIS — M549 Dorsalgia, unspecified: Secondary | ICD-10-CM | POA: Insufficient documentation

## 2018-09-06 DIAGNOSIS — R109 Unspecified abdominal pain: Secondary | ICD-10-CM | POA: Insufficient documentation

## 2018-09-06 DIAGNOSIS — M62838 Other muscle spasm: Secondary | ICD-10-CM | POA: Insufficient documentation

## 2018-09-06 DIAGNOSIS — K219 Gastro-esophageal reflux disease without esophagitis: Secondary | ICD-10-CM | POA: Insufficient documentation

## 2018-09-06 DIAGNOSIS — R399 Unspecified symptoms and signs involving the genitourinary system: Secondary | ICD-10-CM | POA: Insufficient documentation

## 2018-09-06 LAB — POCT URINALYSIS DIP (CLINITEK)
Bilirubin, UA: NEGATIVE
Glucose, UA: NEGATIVE mg/dL
Ketones, POC UA: NEGATIVE mg/dL
Leukocytes, UA: NEGATIVE
Nitrite, UA: NEGATIVE
POC PROTEIN,UA: NEGATIVE
Spec Grav, UA: <=1.005 — AB
Urobilinogen, UA: 0.2 U/dL
pH, UA: 6.5

## 2018-09-06 MED ORDER — FLUCONAZOLE 150 MG PO TABS: 150.0000 mg | ORAL_TABLET | Freq: Once | ORAL | 0 refills | Status: AC

## 2018-09-06 MED ORDER — CYCLOBENZAPRINE HCL 10 MG PO TABS
10.0000 mg | ORAL_TABLET | Freq: Every day | ORAL | 0 refills | Status: DC
Start: 2018-09-06 — End: 2018-12-05

## 2018-09-06 MED FILL — FLUCONAZOLE 150 MG TABS: 150 | 1 days supply | Qty: 1 | Fill #0

## 2018-09-06 MED FILL — CYCLOBENZAPRINE 10 MG TAB: 10 | 30 days supply | Qty: 30 | Fill #0

## 2018-09-06 NOTE — Progress Notes (Signed)
Subjective:  Patient ID: [name], female    DOB: 09/05/1986  Age: 32 y.o. MRN: [id number]  CC: Back Pain   HPI Derrick Ravel complaining of a 3-week history of dysuria, vaginal itch, whitish vaginal discharge and she also complains of right-sided flank pain but no fever, nausea or vomiting. She denies urinary frequency. She has not used any OTC medications There are no relieving factors for her flank pain but it is worsened by movement.  Past Medical History:  Diagnosis Date  . Bulimia   . Bulimia nervosa   . Eating disorder   . GERD (gastroesophageal reflux disease)     Past Surgical History:  Procedure Laterality Date  . COLONOSCOPY WITH PROPOFOL N/A 03/18/2016   Procedure: COLONOSCOPY WITH PROPOFOL;  Surgeon: Vida Rigger, MD;  Location: [facility];  Service: Endoscopy;  Laterality: N/A;  . ESOPHAGOGASTRODUODENOSCOPY (EGD) WITH PROPOFOL N/A 09/22/2017   Procedure: ESOPHAGOGASTRODUODENOSCOPY (EGD) WITH PROPOFOL;  Surgeon: Vida Rigger, MD;  Location: [facility] END[facility]COPY;  Service: Endoscopy;  Laterality: N/A;  . NO PAST SURGERIES      No Known Allergies   Outpatient Medications Prior to Visit  Medication Sig Dispense Refill  . acetaminophen (TYLENOL) 500 MG tablet Take 500-1,000 mg by mouth every 6 (six) hours as needed (for pain.).    Marland Kitchen albuterol (PROVENTIL HFA;VENTOLIN HFA) 108 (90 Base) MCG/ACT inhaler Inhale 2 puffs into the lungs every 6 (six) hours as needed for wheezing or shortness of breath. (Patient not taking: Reported on 07/05/2018) 1 Inhaler 0  . ergocalciferol (DRISDOL) 50000 units capsule Take 1 capsule (50,000 Units total) by mouth once a week. (Patient not taking: Reported on 07/05/2018) 9 capsule 1  . gabapentin (NEURONTIN) 300 MG capsule Take 1 capsule (300 mg total) by mouth at bedtime. (Patient not taking: Reported on 06/06/2018) 30 capsule 3  . hydrocortisone 1 % ointment Apply 1 application topically 2 (two) times daily. (Patient not taking:  Reported on 06/06/2018) 30 g 0  . naproxen (NAPROSYN) 500 MG tablet Take 1 tablet (500 mg total) by mouth 2 (two) times daily with a meal. (Patient not taking: Reported on 07/05/2018) 60 tablet 1  . omeprazole (PRILOSEC) 20 MG capsule Take 20 mg by mouth daily.    . valACYclovir (VALTREX) 1000 MG tablet Take 1 tablet (1,000 mg total) by mouth 2 (two) times daily. (Patient not taking: Reported on 06/06/2018) 14 tablet 0   No facility-administered medications prior to visit.     ROS Review of Systems  Constitutional: Negative for activity change, appetite change and fatigue.  HENT: Negative for congestion, sinus pressure and sore throat.   Eyes: Negative for visual disturbance.  Respiratory: Negative for cough, chest tightness, shortness of breath and wheezing.   Cardiovascular: Negative for chest pain and palpitations.  Gastrointestinal: Negative for abdominal distention, abdominal pain and constipation.  Endocrine: Negative for polydipsia.  Genitourinary: Positive for vaginal discharge. Negative for dysuria and frequency.  Musculoskeletal: Positive for back pain. Negative for arthralgias.  Skin: Negative for rash.  Neurological: Negative for tremors, light-headedness and numbness.  Hematological: Does not bruise/bleed easily.  Psychiatric/Behavioral: Negative for agitation and behavioral problems.     Objective:  BP 96/64   Pulse 68   Temp 97.8 F (36.6 C) (Oral)   Ht 5\' 3"  (1.6 m)   Wt 115 lb 3.2 oz (52.3 kg)   SpO2 100%   BMI 20.41 kg/m   BP/Weight 09/06/2018 07/05/2018 06/14/2018  Systolic BP 96 102 102  Diastolic BP  64 64 67  Wt. (Lbs) 115.2 111.6 113  BMI 20.41 19.77 20.02      Physical Exam  Constitutional: She is oriented to person, place, and time. She appears well-developed and well-nourished.  Cardiovascular: Normal rate, normal heart sounds and intact distal pulses.  No murmur heard. Pulmonary/Chest: Effort normal and breath sounds normal. She has no wheezes. She  has no rales. She exhibits no tenderness.  Abdominal: Soft. Bowel sounds are normal. She exhibits no distension and no mass. There is no tenderness.  Musculoskeletal: She exhibits tenderness (TTP of R flank and on twisting motion of spine).  Neurological: She is alert and oriented to person, place, and time.  Psychiatric: She has a normal mood and affect.     Assessment & Plan:   1. Urinary symptom or sign Negative UA - POCT URINALYSIS DIP (CLINITEK) - Urine cytology ancillary only - Urine Culture  2. Vaginal itching - fluconazole (DIFLUCAN) 150 MG tablet; Take 1 tablet (150 mg total) by mouth once for 1 dose.  Dispense: 1 tablet; Refill: 0  3. Muscle spasm Plan heat - cyclobenzaprine (FLEXERIL) 10 MG tablet; Take 1 tablet (10 mg total) by mouth at bedtime.  Dispense: 30 tablet; Refill: 0   Meds ordered this encounter  Medications  . fluconazole (DIFLUCAN) 150 MG tablet    Sig: Take 1 tablet (150 mg total) by mouth once for 1 dose.    Dispense:  1 tablet    Refill:  0  . cyclobenzaprine (FLEXERIL) 10 MG tablet    Sig: Take 1 tablet (10 mg total) by mouth at bedtime.    Dispense:  30 tablet    Refill:  0    Follow-up: Return if symptoms worsen or fail to improve.   Hoy RegisterEnobong Conception Doebler MD

## 2018-09-06 NOTE — Progress Notes (Signed)
Patient is burning while urinating.

## 2018-09-07 ENCOUNTER — Encounter: Payer: Self-pay | Admitting: Family Medicine

## 2018-09-07 LAB — URINE CYTOLOGY ANCILLARY ONLY
Chlamydia: NEGATIVE
Neisseria Gonorrhea: NEGATIVE
TRICH (WINDOWPATH): NEGATIVE

## 2018-09-08 LAB — URINE CULTURE

## 2018-09-10 ENCOUNTER — Telehealth: Payer: Self-pay

## 2018-09-10 LAB — URINE CYTOLOGY ANCILLARY ONLY
BACTERIAL VAGINITIS: POSITIVE — AB
Candida vaginitis: NEGATIVE

## 2018-09-10 NOTE — Telephone Encounter (Signed)
Patient was called and informed of lab results via interpreter [id number]. 

## 2018-09-10 NOTE — Telephone Encounter (Signed)
-----   Message from Hoy RegisterEnobong Newlin, MD sent at 09/10/2018  8:47 AM EST ----- Urine culture is negative

## 2018-09-11 ENCOUNTER — Other Ambulatory Visit: Payer: Self-pay | Admitting: Family Medicine

## 2018-09-11 MED ORDER — METRONIDAZOLE 0.75 % VA GEL: 1.0000 | Freq: Every day | VAGINAL | 0 refills | Status: DC

## 2018-09-18 ENCOUNTER — Telehealth: Payer: Self-pay

## 2018-09-18 ENCOUNTER — Other Ambulatory Visit: Payer: Self-pay | Admitting: Family Medicine

## 2018-09-18 MED ORDER — METRONIDAZOLE 500 MG PO TABS: 500.0000 mg | ORAL_TABLET | Freq: Two times a day (BID) | ORAL | 0 refills | Status: DC

## 2018-09-18 NOTE — Telephone Encounter (Signed)
-----   Message from Hoy RegisterEnobong Newlin, MD sent at 09/11/2018  1:29 PM EST ----- Urine test revealed bacterial vaginosis which is not an STD.  I have sent a prescription for MetroGel to her pharmacy

## 2018-09-18 NOTE — Telephone Encounter (Signed)
Patient was called and informed of lab results via interpreter. 

## 2018-11-13 ENCOUNTER — Ambulatory Visit: Payer: Self-pay | Attending: Family Medicine

## 2018-11-19 ENCOUNTER — Telehealth: Payer: Self-pay | Admitting: Family Medicine

## 2018-11-19 NOTE — Telephone Encounter (Signed)
I called Pt inreferent of the request of getting the non filling letter from [employer], she informed me that she does not have a SS# and she is not a legal resident.Marland KitchenMarland KitchenMarland Kitchen

## 2018-12-05 ENCOUNTER — Other Ambulatory Visit: Payer: Self-pay

## 2018-12-05 ENCOUNTER — Encounter: Payer: Self-pay | Admitting: Family Medicine

## 2018-12-05 ENCOUNTER — Ambulatory Visit: Payer: Self-pay | Attending: Family Medicine | Admitting: Family Medicine

## 2018-12-05 VITALS — BP 98/65 | HR 79 | Temp 97.8°F | Ht 63.0 in | Wt 117.0 lb

## 2018-12-05 DIAGNOSIS — M62838 Other muscle spasm: Secondary | ICD-10-CM

## 2018-12-05 DIAGNOSIS — R399 Unspecified symptoms and signs involving the genitourinary system: Secondary | ICD-10-CM

## 2018-12-05 DIAGNOSIS — R109 Unspecified abdominal pain: Secondary | ICD-10-CM

## 2018-12-05 LAB — POCT URINALYSIS DIP (CLINITEK)
BILIRUBIN UA: NEGATIVE
BILIRUBIN UA: NEGATIVE mg/dL
Glucose, UA: NEGATIVE mg/dL
Leukocytes, UA: NEGATIVE
NITRITE UA: NEGATIVE
PH UA: 5.5 (ref 5.0–8.0)
POC PROTEIN,UA: NEGATIVE
RBC UA: NEGATIVE
Spec Grav, UA: 1.02 (ref 1.010–1.025)
Urobilinogen, UA: 0.2 E.U./dL

## 2018-12-05 MED ORDER — CYCLOBENZAPRINE HCL 10 MG PO TABS: 10.0000 mg | ORAL_TABLET | Freq: Every day | ORAL | 0 refills | Status: DC

## 2018-12-05 NOTE — Progress Notes (Signed)
Subjective:  Patient ID: [name], female    DOB: September 25, 1986  Age: 33 y.o. MRN: [id number]  CC: Flank Pain   HPI [name] with a 1 month history of dysuria which she describes as mild, associated right flank pain, denies nausea, vomiting, fever. She has used cranberry juice and cranberry pills with no improvement in symptoms. Urinalysis performed in the clinic is negative. She denies vaginal discharge, vaginal itching.  Past Medical History:  Diagnosis Date  . Bulimia   . Bulimia nervosa   . Eating disorder   . GERD (gastroesophageal reflux disease)     Past Surgical History:  Procedure Laterality Date  . COLONOSCOPY WITH PROPOFOL N/A 03/18/2016   Procedure: COLONOSCOPY WITH PROPOFOL;  Surgeon: Vida Rigger, MD;  Location: [facility];  Service: Endoscopy;  Laterality: N/A;  . ESOPHAGOGASTRODUODENOSCOPY (EGD) WITH PROPOFOL N/A 09/22/2017   Procedure: ESOPHAGOGASTRODUODENOSCOPY (EGD) WITH PROPOFOL;  Surgeon: Vida Rigger, MD;  Location: [facility] ENDOSCOPY;  Service: Endoscopy;  Laterality: N/A;  . NO PAST SURGERIES      Family History  Problem Relation Age of Onset  . Diabetes Father   . Hearing loss Neg Hx   . Cancer Neg Hx   . Heart failure Neg Hx     No Known Allergies  Outpatient Medications Prior to Visit  Medication Sig Dispense Refill  . acetaminophen (TYLENOL) 500 MG tablet Take 500-1,000 mg by mouth every 6 (six) hours as needed (for pain.).    Marland Kitchen albuterol (PROVENTIL HFA;VENTOLIN HFA) 108 (90 Base) MCG/ACT inhaler Inhale 2 puffs into the lungs every 6 (six) hours as needed for wheezing or shortness of breath. (Patient not taking: Reported on 07/05/2018) 1 Inhaler 0  . ergocalciferol (DRISDOL) 50000 units capsule Take 1 capsule (50,000 Units total) by mouth once a week. (Patient not taking: Reported on 07/05/2018) 9 capsule 1  . gabapentin (NEURONTIN) 300 MG capsule Take 1 capsule (300 mg total) by mouth at bedtime. (Patient not taking: Reported on  06/06/2018) 30 capsule 3  . hydrocortisone 1 % ointment Apply 1 application topically 2 (two) times daily. (Patient not taking: Reported on 06/06/2018) 30 g 0  . metroNIDAZOLE (FLAGYL) 500 MG tablet Take 1 tablet (500 mg total) by mouth 2 (two) times daily. (Patient not taking: Reported on 12/05/2018) 14 tablet 0  . naproxen (NAPROSYN) 500 MG tablet Take 1 tablet (500 mg total) by mouth 2 (two) times daily with a meal. (Patient not taking: Reported on 07/05/2018) 60 tablet 1  . omeprazole (PRILOSEC) 20 MG capsule Take 20 mg by mouth daily.    . valACYclovir (VALTREX) 1000 MG tablet Take 1 tablet (1,000 mg total) by mouth 2 (two) times daily. (Patient not taking: Reported on 06/06/2018) 14 tablet 0  . cyclobenzaprine (FLEXERIL) 10 MG tablet Take 1 tablet (10 mg total) by mouth at bedtime. (Patient not taking: Reported on 12/05/2018) 30 tablet 0   No facility-administered medications prior to visit.      ROS Review of Systems  Constitutional: Negative for activity change, appetite change and fatigue.  HENT: Negative for congestion, sinus pressure and sore throat.   Eyes: Negative for visual disturbance.  Respiratory: Negative for cough, chest tightness, shortness of breath and wheezing.   Cardiovascular: Negative for chest pain and palpitations.  Gastrointestinal: Negative for abdominal distention, abdominal pain and constipation.  Endocrine: Negative for polydipsia.  Genitourinary: Positive for flank pain. Negative for dysuria and frequency.  Musculoskeletal: Negative for arthralgias and back pain.  Skin: Negative for  rash.  Neurological: Negative for tremors, light-headedness and numbness.  Hematological: Does not bruise/bleed easily.  Psychiatric/Behavioral: Negative for agitation and behavioral problems.    Objective:  BP 98/65   Pulse 79   Temp 97.8 F (36.6 C) (Oral)   Ht 5\' 3"  (1.6 m)   Wt 117 lb (53.1 kg)   SpO2 100%   BMI 20.73 kg/m   BP/Weight 12/05/2018 09/06/2018 07/05/2018    Systolic BP 98 96 102  Diastolic BP 65 64 64  Wt. (Lbs) 117 115.2 111.6  BMI 20.73 20.41 19.77      Physical Exam Constitutional:      Appearance: She is well-developed.  Cardiovascular:     Rate and Rhythm: Normal rate.     Heart sounds: Normal heart sounds. No murmur.  Pulmonary:     Effort: Pulmonary effort is normal.     Breath sounds: Normal breath sounds. No wheezing or rales.  Chest:     Chest wall: No tenderness.  Abdominal:     General: Bowel sounds are normal. There is no distension.     Palpations: Abdomen is soft. There is no mass.     Tenderness: There is no abdominal tenderness.  Musculoskeletal: Normal range of motion.  Neurological:     Mental Status: She is alert and oriented to person, place, and time.     CMP Latest Ref Rng & Units 06/06/2018 04/14/2018 01/10/2018  Glucose 65 - 99 mg/dL 84 340(Z) 87  BUN 6 - 20 mg/dL 9 7 6   Creatinine 0.57 - 1.00 mg/dL 7.09 6.43 8.38  Sodium 134 - 144 mmol/L 141 138 141  Potassium 3.5 - 5.2 mmol/L 4.3 4.2 4.8  Chloride 96 - 106 mmol/L 103 106 104  CO2 20 - 29 mmol/L 22 23 21   Calcium 8.7 - 10.2 mg/dL 9.4 9.4 9.6  Total Protein 6.5 - 8.1 g/dL - - -  Total Bilirubin 0.3 - 1.2 mg/dL - - -  Alkaline Phos 38 - 126 U/L - - -  AST 15 - 41 U/L - - -  ALT 14 - 54 U/L - - -    Lipid Panel  No results found for: CHOL, TRIG, HDL, CHOLHDL, VLDL, LDLCALC, LDLDIRECT  CBC    Component Value Date/Time   WBC 5.8 04/14/2018 0638   RBC 4.40 04/14/2018 0638   HGB 13.3 04/14/2018 0638   HGB 13.4 02/07/2018 1657   HCT 39.5 04/14/2018 0638   HCT 39.0 02/07/2018 1657   PLT 325 04/14/2018 0638   PLT 350 02/07/2018 1657   MCV 89.8 04/14/2018 0638   MCV 87 02/07/2018 1657   MCH 30.2 04/14/2018 0638   MCHC 33.7 04/14/2018 0638   RDW 12.3 04/14/2018 0638   RDW 13.1 02/07/2018 1657   LYMPHSABS 2.7 02/07/2018 1657   MONOABS 0.3 10/02/2017 1139   EOSABS 0.4 02/07/2018 1657   BASOSABS 0.0 02/07/2018 1657    Lab Results   Component Value Date   HGBA1C 5.2 10/10/2017    Assessment & Plan:   1. Urinary symptom or sign UA negative for UTI Increase fluid intake, cranberry juice - POCT URINALYSIS DIP (CLINITEK) - Urine cytology ancillary only  2. Muscle spasm - cyclobenzaprine (FLEXERIL) 10 MG tablet; Take 1 tablet (10 mg total) by mouth at bedtime.  Dispense: 30 tablet; Refill: 0  3. Right flank pain Likely muscular   Meds ordered this encounter  Medications  . cyclobenzaprine (FLEXERIL) 10 MG tablet    Sig: Take 1 tablet (10 mg  total) by mouth at bedtime.    Dispense:  30 tablet    Refill:  0    Follow-up: Return in about 1 month (around 01/05/2019) for PAP smear.       [name], MD, FAAFP. [facility] [place], [place] [phone]   12/05/2018, 10:08 AM

## 2018-12-05 NOTE — Progress Notes (Signed)
Patient is having pain on right side and burning while urinating.

## 2018-12-06 LAB — URINE CYTOLOGY ANCILLARY ONLY
Chlamydia: NEGATIVE
Neisseria Gonorrhea: NEGATIVE
Trichomonas: NEGATIVE

## 2018-12-07 ENCOUNTER — Telehealth: Payer: Self-pay

## 2018-12-07 NOTE — Telephone Encounter (Signed)
Patient was called and informed of lab results via interpreter. 

## 2018-12-07 NOTE — Telephone Encounter (Signed)
-----   Message from [name], MD sent at 12/07/2018 11:58 AM EST ----- Please inform the patient that labs are normal. Thank you.

## 2018-12-08 LAB — URINE CYTOLOGY ANCILLARY ONLY
BACTERIAL VAGINITIS: NEGATIVE
CANDIDA VAGINITIS: NEGATIVE

## 2018-12-14 ENCOUNTER — Telehealth: Payer: Self-pay

## 2018-12-14 NOTE — Telephone Encounter (Signed)
-----   Message from [name], MD sent at 12/10/2018  1:59 PM EDT ----- Please inform the patient that labs are normal. Thank you.

## 2018-12-14 NOTE — Telephone Encounter (Signed)
Patient was called and informed of lab results. Patient had no questions.[phone])

## 2019-01-07 ENCOUNTER — Encounter: Payer: Self-pay | Admitting: Family Medicine

## 2019-01-07 ENCOUNTER — Ambulatory Visit: Payer: Self-pay | Attending: Family Medicine | Admitting: Family Medicine

## 2019-01-07 ENCOUNTER — Other Ambulatory Visit: Payer: Self-pay

## 2019-01-07 DIAGNOSIS — M940 Chondrocostal junction syndrome [Tietze]: Secondary | ICD-10-CM

## 2019-01-07 MED ORDER — MELOXICAM 7.5 MG PO TABS: 7.5000 mg | ORAL_TABLET | Freq: Every day | ORAL | 1 refills | Status: DC

## 2019-01-07 NOTE — Progress Notes (Signed)
Virtual Visit via Telephone Note  I connected with [name] on 01/07/19 at 10:30 AM EDT by telephone and verified that I am speaking with the correct person using two identifiers.   I discussed the limitations, risks, security and privacy concerns of performing an evaluation and management service by telephone and the availability of in person appointments. I also discussed with the patient that there may be a patient responsible charge related to this service. The patient expressed understanding and agreed to proceed.   History of Present Illness: [name] is a 33 year old female who presents today via telephone visit with complaints of chest pains for the last 5 days located in the left inframammary region.  Pain is described as strong and associated with increased heart rate on some occasions but denies the presence of dyspnea at rest.  She states when she takes a deep breath the pain increases.  She denies cough, rhinorrhea, wheezing, fever, trauma to her chest wall.  Tylenol and Advil have provided minimal relief.   Observations/Objective: Awake, alert, oriented x3 Not in acute distress I advised her to apply pressure to her left chest wall during this encounter and she reported reproducible pain.  Assessment and Plan: 1. Costochondritis History points towards musculoskeletal origin of pain however she has been advised due to the limited nature of this visit to present to the ED if symptoms persist Commenced on meloxicam - meloxicam (MOBIC) 7.5 MG tablet; Take 1 tablet (7.5 mg total) by mouth daily.  Dispense: 30 tablet; Refill: 1   Follow Up Instructions:    I discussed the assessment and treatment plan with the patient. The patient was provided an opportunity to ask questions and all were answered. The patient agreed with the plan and demonstrated an understanding of the instructions.   The patient was advised to call back or seek an in-person evaluation if the  symptoms worsen or if the condition fails to improve as anticipated.  I provided 10 minutes of non-face-to-face time during this encounter.   [name], MD

## 2019-01-07 NOTE — Progress Notes (Signed)
Patient has been called and DOB has been verified. Patient has been screened and transferred to PCP to start phone visit.  C/C: pain in left side of chest.  Refills:

## 2019-01-16 ENCOUNTER — Ambulatory Visit: Payer: Self-pay | Admitting: Family Medicine

## 2019-01-17 ENCOUNTER — Other Ambulatory Visit: Payer: Self-pay

## 2019-01-17 ENCOUNTER — Ambulatory Visit: Payer: Self-pay | Attending: Family Medicine | Admitting: Physician Assistant

## 2019-01-17 DIAGNOSIS — R609 Edema, unspecified: Secondary | ICD-10-CM

## 2019-01-17 DIAGNOSIS — Z789 Other specified health status: Secondary | ICD-10-CM

## 2019-01-17 NOTE — Progress Notes (Signed)
Called patient using [employer] to initiate her televisit with provider Georgian Co, PA. Interpreter Jacqlyn Larsen ID # [phone].  Patient has c/o of B foot swelling off & on x 1 week. Also having c/o foot pain.

## 2019-01-17 NOTE — Progress Notes (Signed)
Virtual Visit via Telephone Note  I connected with [name] on 01/17/19 at 10:10 AM EDT by telephone and verified that I am speaking with the correct person using two identifiers.   I discussed the limitations, risks, security and privacy concerns of performing an evaluation and management service by telephone and the availability of in person appointments. I also discussed with the patient that there may be a patient responsible charge related to this service. The patient expressed understanding and agreed to proceed.   History of Present Illness:  [name] with interpreter services is translating. Patient c/o 1 week h/o B feet swelling.  No new/different activities.  Both feet affected equally.  Some pain when swollen.  Swelling goes down with elevation.  Has been occurring for about 1 week.  No diet changes.  No SOB/CP.      Observations/Objective:  TP linear.    Assessment and Plan: 1. Edema, unspecified type Increase water intake, decrease sugar/salt intake.  Elevate legs.  Nurse will call to make lab-only appt.   - Basic metabolic panel; Future - TSH; Future - Vitamin D, 25-hydroxy; Future  2. Language barrier stratus interpreters used and additional time performing visit was required.    Follow Up Instructions: F/up prn    I discussed the assessment and treatment plan with the patient. The patient was provided an opportunity to ask questions and all were answered. The patient agreed with the plan and demonstrated an understanding of the instructions.   The patient was advised to call back or seek an in-person evaluation if the symptoms worsen or if the condition fails to improve as anticipated.  I provided 8 minutes of non-face-to-face time during this encounter.   Georgian Co, PA-C  Patient ID: [name], female   DOB: Dec 21, 1985, 33 y.o.   MRN: [id number]

## 2019-01-21 ENCOUNTER — Ambulatory Visit: Payer: Self-pay | Attending: Family Medicine

## 2019-01-21 ENCOUNTER — Other Ambulatory Visit: Payer: Self-pay

## 2019-01-21 DIAGNOSIS — R609 Edema, unspecified: Secondary | ICD-10-CM

## 2019-01-22 ENCOUNTER — Telehealth: Payer: Self-pay | Admitting: Emergency Medicine

## 2019-01-22 ENCOUNTER — Other Ambulatory Visit: Payer: Self-pay | Admitting: Physician Assistant

## 2019-01-22 DIAGNOSIS — R7989 Other specified abnormal findings of blood chemistry: Secondary | ICD-10-CM

## 2019-01-22 LAB — TSH: TSH: 4.74 u[IU]/mL — ABNORMAL HIGH (ref 0.450–4.500)

## 2019-01-22 LAB — BASIC METABOLIC PANEL
BUN/Creatinine Ratio: 10 (ref 9–23)
BUN: 8 mg/dL (ref 6–20)
CO2: 23 mmol/L (ref 20–29)
Calcium: 9.6 mg/dL (ref 8.7–10.2)
Chloride: 99 mmol/L (ref 96–106)
Creatinine, Ser: 0.77 mg/dL (ref 0.57–1.00)
GFR calc Af Amer: 117 mL/min/{1.73_m2} (ref >59)
GFR calc non Af Amer: 101 mL/min/{1.73_m2} (ref >59)
Glucose: 84 mg/dL (ref 65–99)
Potassium: 4.5 mmol/L (ref 3.5–5.2)
Sodium: 135 mmol/L (ref 134–144)

## 2019-01-22 LAB — VITAMIN D 25 HYDROXY (VIT D DEFICIENCY, FRACTURES): Vit D, 25-Hydroxy: 18.5 ng/mL — ABNORMAL LOW (ref 30.0–100.0)

## 2019-01-22 MED ORDER — VITAMIN D (ERGOCALCIFEROL) 1.25 MG (50000 UNIT) PO CAPS: 50000.0000 [IU] | ORAL_CAPSULE | ORAL | 0 refills | Status: DC

## 2019-01-22 MED FILL — VIT D2 1.25 MG (50,000 UNIT: 1.25 MG | 84 days supply | Qty: 12 | Fill #0

## 2019-01-22 NOTE — Telephone Encounter (Signed)
Patient contacted via phone to be given results of labs.  Patient identified by name and date of birth.  Patient given results of labs.  Patient educated on lab results. Questions answered.  Lab visit made for next week.   Patient acknowledged understanding of labs results.

## 2019-01-29 ENCOUNTER — Other Ambulatory Visit: Payer: Self-pay

## 2019-01-29 ENCOUNTER — Ambulatory Visit: Payer: Self-pay | Attending: Family Medicine

## 2019-01-29 ENCOUNTER — Telehealth: Payer: Self-pay | Admitting: Family Medicine

## 2019-01-29 DIAGNOSIS — R7989 Other specified abnormal findings of blood chemistry: Secondary | ICD-10-CM

## 2019-01-29 NOTE — Telephone Encounter (Signed)
Patient called because she has been feeling burning while urinating and would like to know  If she can get a test done/drop off urine sample. Please follow up.

## 2019-01-30 LAB — THYROID PANEL WITH TSH
Free Thyroxine Index: 1.5 (ref 1.2–4.9)
T3 Uptake Ratio: 22 % — ABNORMAL LOW (ref 24–39)
T4, Total: 6.9 ug/dL (ref 4.5–12.0)
TSH: 2.9 u[IU]/mL (ref 0.450–4.500)

## 2019-03-13 ENCOUNTER — Ambulatory Visit: Payer: Self-pay | Attending: Family Medicine | Admitting: Physician Assistant

## 2019-03-13 ENCOUNTER — Ambulatory Visit: Payer: Self-pay

## 2019-03-13 ENCOUNTER — Other Ambulatory Visit: Payer: Self-pay

## 2019-03-13 VITALS — BP 110/75 | HR 81 | Temp 98.2°F | Resp 16

## 2019-03-13 DIAGNOSIS — R300 Dysuria: Secondary | ICD-10-CM

## 2019-03-13 LAB — POCT URINALYSIS DIP (CLINITEK)
Bilirubin, UA: NEGATIVE
Blood, UA: NEGATIVE
Glucose, UA: NEGATIVE mg/dL
Ketones, POC UA: NEGATIVE mg/dL
Leukocytes, UA: NEGATIVE
Nitrite, UA: NEGATIVE
POC PROTEIN,UA: NEGATIVE
Spec Grav, UA: 1.01 (ref 1.010–1.025)
Urobilinogen, UA: 0.2 E.U./dL
pH, UA: 7 (ref 5.0–8.0)

## 2019-03-13 NOTE — Progress Notes (Signed)
Patient ID: [name], female   DOB: 05/02/1986, 33 y.o.   MRN: [id number]      [name], is a 33 y.o. female  [id number]:[id number]  [id number]:[id number]  DOB - 06/06/1986  Subjective:  Chief Complaint and HPI: [name] is a 33 y.o. female here today   Some burning and itching in the vaginal region and burning with urination. No pelvic/abdominal pain.  No fever.  Some vaginal discharge.  She had her Implanon removed 2 weeks ago.  MD gave her an Rx but she isn't sure what the prescription was.  She took the medication.   Diet/nutrition is good.    [name] with [employer] translating.  ROS:   Constitutional:  No f/c, No night sweats, No unexplained weight loss. EENT:  No vision changes, No blurry vision, No hearing changes. No mouth, throat, or ear problems.  Respiratory: No cough, No SOB Cardiac: No CP, no palpitations GI:  No abd pain, No N/V/D. GU: see above Musculoskeletal: No joint pain Neuro: No headache, no dizziness, no motor weakness.  Skin: No rash Endocrine:  No polydipsia. No polyuria.  Psych: Denies SI/HI  No problems updated.  ALLERGIES: No Known Allergies  PAST MEDICAL HISTORY: Past Medical History:  Diagnosis Date  . Bulimia   . Bulimia nervosa   . Eating disorder   . GERD (gastroesophageal reflux disease)     MEDICATIONS AT HOME: Prior to Admission medications   Medication Sig Start Date End Date Taking? Authorizing Provider  meloxicam (MOBIC) 7.5 MG tablet Take 1 tablet (7.5 mg total) by mouth daily. Patient not taking: Reported on 03/13/2019 01/07/19   Charlott Rakes, MD  Vitamin D, Ergocalciferol, (DRISDOL) 1.25 MG (50000 UT) CAPS capsule Take 1 capsule (50,000 Units total) by mouth every 7 (seven) days. Patient not taking: Reported on 03/13/2019 01/22/19   Argentina Donovan, PA-C     Objective:  EXAM:   Vitals:   03/13/19 1453  BP: 110/75  Pulse: 81  Resp: 16  Temp: 98.2 F (36.8 C)  TempSrc: Oral  SpO2: 99%    General  appearance : A&OX3. NAD. Non-toxic-appearing, limited exam to mitigate Covid-19 risks HEENT: Atraumatic and Normocephalic.  PERRLA. EOM intact.  Chest/Lungs:  Breathing-non-labored,Neurology:  CN II-XII grossly intact, Non focal.   Psych:  TP linear. J/I WNL. Normal speech. Appropriate eye contact and affect.  Skin:  No Rash  Data Review Lab Results  Component Value Date   HGBA1C 5.2 10/10/2017     Assessment & Plan   1. Dysuria on and off with vaginal discharge s/p removal of implanon 2 weeks ago UA dip shows no infection.  Drink 80 ounces water daily.  Await results - POCT URINALYSIS DIP (CLINITEK) - Urine cytology ancillary only - Urine Culture   Patient have been counseled extensively about nutrition and exercise  Return if symptoms worsen or fail to improve.  The patient was given clear instructions to go to ER or return to medical center if symptoms don't improve, worsen or new problems develop. The patient verbalized understanding. The patient was told to call to get lab results if they haven't heard anything in the next week.     Freeman Caldron, PA-C [facility] and [place], [place]   03/13/2019, 3:03 PM

## 2019-03-14 ENCOUNTER — Telehealth: Payer: Self-pay | Admitting: Family Medicine

## 2019-03-14 LAB — URINE CYTOLOGY ANCILLARY ONLY
Chlamydia: NEGATIVE
Neisseria Gonorrhea: NEGATIVE
Trichomonas: NEGATIVE

## 2019-03-14 NOTE — Telephone Encounter (Signed)
1) Medication(s) Requested (by name): Hand cream 2) Pharmacy of Choice: [facility] 3) Special Requests:   Approved medications will be sent to the pharmacy, we will reach out if there is an issue.  Requests made after 3pm may not be addressed until the following business day!  If a patient is unsure of the name of the medication(s) please note and ask patient to call back when they are able to provide all info, do not send to responsible party until all information is available!

## 2019-03-15 LAB — URINE CULTURE: Organism ID, Bacteria: NO GROWTH

## 2019-03-16 LAB — URINE CYTOLOGY ANCILLARY ONLY
Bacterial vaginitis: NEGATIVE
Candida vaginitis: NEGATIVE

## 2019-03-18 NOTE — Telephone Encounter (Signed)
Patient has used Triamcinolone 0.1% in the past and is requesting a refill. Last RX was written by you in 2018, it was marked as patient not taking on 10/02/17 during an ED visit. Please refill if appropriate.

## 2019-03-19 MED ORDER — TRIAMCINOLONE ACETONIDE 0.1 % EX CREA: 1.0000 "application " | TOPICAL_CREAM | Freq: Two times a day (BID) | CUTANEOUS | 0 refills | Status: DC

## 2019-03-19 NOTE — Telephone Encounter (Signed)
Refilled

## 2019-03-27 MED FILL — TRIAMCINOLONE ACETONIDE 0.1: 0.1 | 30 days supply | Qty: 30 | Fill #0

## 2019-04-02 ENCOUNTER — Encounter: Payer: Self-pay | Admitting: Primary Care

## 2019-04-02 ENCOUNTER — Other Ambulatory Visit: Payer: Self-pay

## 2019-04-02 ENCOUNTER — Ambulatory Visit: Payer: Self-pay | Attending: Primary Care | Admitting: Primary Care

## 2019-04-02 ENCOUNTER — Telehealth: Payer: Self-pay | Admitting: Family Medicine

## 2019-04-02 VITALS — BP 116/80 | HR 83 | Temp 98.6°F | Ht 63.0 in | Wt 117.6 lb

## 2019-04-02 DIAGNOSIS — L308 Other specified dermatitis: Secondary | ICD-10-CM

## 2019-04-02 MED ORDER — TRIAMCINOLONE ACETONIDE 0.1 % EX CREA: 1.0000 "application " | TOPICAL_CREAM | Freq: Two times a day (BID) | CUTANEOUS | 0 refills | Status: DC

## 2019-04-02 MED FILL — ?TRIAMCINOLONE 0.1% CRM: 0.1 | 15 days supply | Qty: 30 | Fill #0

## 2019-04-02 NOTE — Progress Notes (Signed)
Acute Office Visit  Subjective:    Patient ID: [name], female    DOB: 04/02/1986, 33 y.o.   MRN: [id number]  Chief Complaint  Patient presents with  . Rash    on hands   . Medication Refill    clotrimazole    HPI Patient is in today for an acute visit complains of a rash on both hands She doesn't use any particular type of soaps, detergents, or multiple different products.  Past Medical History:  Diagnosis Date  . Bulimia   . Bulimia nervosa   . Eating disorder   . GERD (gastroesophageal reflux disease)     Past Surgical History:  Procedure Laterality Date  . COLONOSCOPY WITH PROPOFOL N/A 03/18/2016   Procedure: COLONOSCOPY WITH PROPOFOL;  Surgeon: Vida RiggerMarc Magod, MD;  Location: [facility]COPY;  Service: Endoscopy;  Laterality: N/A;  . ESOPHAGOGASTRODUODENOSCOPY (EGD) WITH PROPOFOL N/A 09/22/2017   Procedure: ESOPHAGOGASTRODUODENOSCOPY (EGD) WITH PROPOFOL;  Surgeon: Vida RiggerMagod, Marc, MD;  [facility];  Service: Endoscopy;  Laterality: N/A;  . NO PAST SURGERIES      Family History  Problem Relation Age of Onset  . Diabetes Father   . Hearing loss Neg Hx   . Cancer Neg Hx   . Heart failure Neg Hx     Social History   Socioeconomic History  . Marital status: Married    Spouse name: Not on file  . Number of children: Not on file  . Years of education: Not on file  . Highest education level: Not on file  Occupational History  . Not on file  Social Needs  . Financial resource strain: Not on file  . Food insecurity    Worry: Not on file    Inability: Not on file  . Transportation needs    Medical: Not on file    Non-medical: Not on file  Tobacco Use  . Smoking status: Never Smoker  . Smokeless tobacco: Never Used  Substance and Sexual Activity  . Alcohol use: No  . Drug use: No  . Sexual activity: Yes    Birth control/protection: Implant  Lifestyle  . Physical activity    Days per week: Not on file    Minutes per session: Not on file  .  Stress: Not on file  Relationships  . Social Musicianconnections    Talks on phone: Not on file    Gets together: Not on file    Attends religious service: Not on file    Active member of club or organization: Not on file    Attends meetings of clubs or organizations: Not on file    Relationship status: Not on file  . Intimate partner violence    Fear of current or ex partner: Not on file    Emotionally abused: Not on file    Physically abused: Not on file    Forced sexual activity: Not on file  Other Topics Concern  . Not on file  Social History Narrative   ** Merged History Encounter **       Lives in [place] since 2005 with husband and 2 children (2011, 2008). [place]oved [place] in 2005. Spanish speaking.     Outpatient Medications Prior to Visit  Medication Sig Dispense Refill  . triamcinolone cream (KENALOG) 0.1 % Apply 1 application topically 2 (two) times daily. (Patient not taking: Reported on 04/02/2019) 30 g 0  . meloxicam (MOBIC) 7.5 MG tablet Take 1 tablet (7.5 mg total) by mouth daily. (  Patient not taking: Reported on 03/13/2019) 30 tablet 1  . Vitamin D, Ergocalciferol, (DRISDOL) 1.25 MG (50000 UT) CAPS capsule Take 1 capsule (50,000 Units total) by mouth every 7 (seven) days. (Patient not taking: Reported on 03/13/2019) 16 capsule 0   No facility-administered medications prior to visit.     No Known Allergies  ROS     Objective:    Physical Exam  BP 116/80 (BP Location: Left Arm, Patient Position: Sitting, Cuff Size: Normal)   Pulse 83   Temp 98.6 F (37 C) (Oral)   Ht 5\' 3"  (1.6 m)   Wt 117 lb 9.6 oz (53.3 kg)   LMP 03/18/2019 (Approximate)   SpO2 98%   BMI 20.83 kg/m  Wt Readings from Last 3 Encounters:  04/02/19 117 lb 9.6 oz (53.3 kg)  12/05/18 117 lb (53.1 kg)  09/06/18 115 lb 3.2 oz (52.3 kg)    Health Maintenance Due  Topic Date Due  . PAP SMEAR-Modifier  01/04/2006    There are no preventive care reminders to display for this patient.    Lab Results  Component Value Date   TSH 2.900 01/29/2019   Lab Results  Component Value Date   WBC 5.8 04/14/2018   HGB 13.3 04/14/2018   HCT 39.5 04/14/2018   MCV 89.8 04/14/2018   PLT 325 04/14/2018   Lab Results  Component Value Date   NA 135 01/21/2019   K 4.5 01/21/2019   CO2 23 01/21/2019   GLUCOSE 84 01/21/2019   BUN 8 01/21/2019   CREATININE 0.77 01/21/2019   BILITOT 0.4 10/27/2017   ALKPHOS 84 10/27/2017   AST 19 10/27/2017   ALT 13 (L) 10/27/2017   PROT 7.5 10/27/2017   ALBUMIN 4.3 10/27/2017   CALCIUM 9.6 01/21/2019   ANIONGAP 9 04/14/2018   No results found for: CHOL No results found for: HDL No results found for: LDLCALC No results found for: TRIG No results found for: CHOLHDL Lab Results  Component Value Date   HGBA1C 5.2 10/10/2017       Assessment & Plan:   [name] was seen today for rash and medication refill.  Diagnoses and all orders for this visit:  Other eczema Patient was stating she has bumps on her hand what has water unable to identfy any abnormality found on hand will prescribe ointment defer dermatology refer until actual  Bumps appear  Other orders -     triamcinolone cream (KENALOG) 0.1 %; Apply 1 application topically 2 (two) times daily.    No orders of the defined types were placed in this encounter.    Kerin Perna, NP

## 2019-04-02 NOTE — Patient Instructions (Signed)
Dermatitis de contacto Contact Dermatitis La dermatitis es el enrojecimiento, el dolor y la hinchazn (inflamacin) de la piel. La dermatitis de contacto es una reaccin a ciertas sustancias que entran en contacto con la piel. Muchas sustancias diferentes pueden causar dermatitis de contacto. Hay dos tipos de dermatitis de contacto:  Dermatitis de contacto irritativa. La causa de este tipo de dermatitis es algo que irrita la piel, como tener las manos secas por lavarlas muy seguido con jabn. Este tipo no requiere la exposicin previa a la sustancia que caus la reaccin. Este es el tipo ms frecuente.  Dermatitis de contacto alrgica. La causa de este tipo de dermatitis es una sustancia a la cual se es Air cabin crew, como la hiedra venenosa. Este tipo se produce cuando se ha estado expuesto a la sustancia (alrgeno) y se produce sensibilidad a ella. La dermatitis puede presentarse poco despus de la primera exposicin al alrgeno, o es posible que no aparezca hasta la prxima vez que se encuentre expuesto y cada vez a partir de entonces. Cules son las causas? La causa ms frecuente de la dermatitis de contacto irritativa es la exposicin a lo siguiente:  Maquillaje.  Jabones.  Detergentes.  Lavandina.  cidos.  Sales metlicas, como el nquel. La causa ms frecuente de la dermatitis de contacto alrgica es la exposicin a lo siguiente:  Plantas venenosas.  Productos qumicos.  Alhajas.  Ltex.  Medicamentos.  Conservantes que se utilizan en determinados productos, como la ropa. Qu incrementa el riesgo? Es ms probable que tenga esta afeccin si presenta:  Un trabajo que lo expone a sustancias irritantes o a alrgenos.  Determinadas afecciones mdicas, por ejemplo, asma o eczema. Cules son los signos o los sntomas? Los sntomas de esta afeccin pueden presentarse en cualquier parte del cuerpo con la que usted toque la sustancia irritante o donde la sustancia irritante lo  haya tocado.  Algunos de los sntomas son los siguientes: ? Sequedad o Teacher, music. ? Enrojecimiento. ? Grietas. ? Picazn. ? Dolor o sensacin de ardor. ? Ampollas. ? Secrecin de pequeas cantidades de sangre o lquido transparente que emanan de las grietas de la piel. En el caso de la dermatitis de Risk manager, puede haber hinchazn solo en algunas partes del cuerpo, como la boca o los genitales. Cmo se diagnostica? Esta afeccin se diagnostica mediante una revisin de los antecedentes mdicos y un examen fsico.  Se puede realizar una prueba del parche para ayudar a Office manager causa.  Si la afeccin guarda relacin con Leander Rams, tal vez deba consultar a un especialista en medicina ocupacional. Cmo se trata? Esta afeccin se trata mediante un control para detectar la causa de la reaccin y proteger la piel de nuevos contactos. El tratamiento tambin puede incluir lo siguiente:  Cremas o ungentos con corticoesteroides. En los casos ms graves ser necesario aplicar medicamentos con corticoesteroides por va oral.  Medicamentos con antibiticos o ungentos antibacterianos, si hay una infeccin en la piel.  Antihistamnicos en forma de locin o por va oral para calmar la picazn.  Una venda (vendaje). Siga estas indicaciones en su casa: Cuidado de la piel  Humctese la piel segn sea necesario.  Aplique compresas fras en las zonas afectadas.  Intente colocarse una pasta de bicarbonato de [employer]. Agregue agua al bicarbonato hasta que tenga la consistencia de una pasta.  No se rasque la piel, y evite la friccin de la zona afectada.  Evite el uso de [place], perfumes y tintes. [employer] o  aplquese los medicamentos de venta libre y los recetados solamente como se lo haya indicado el mdico.  Si le recetaron un antibitico, tmelo o aplqueselo como se lo haya indicado el mdico. No deje de usar el antibitico aunque la afeccin mejore.  Baarse  Trate de tomar un bao con lo siguiente: ? Sales de [place]. Siga las indicaciones del envase. Puede conseguirlas en la tienda de comestibles o la farmacia local. ? Bicarbonato de sodio. Vierta un poco en la baera como se lo haya indicado el mdico. ? Avena coloidal. Siga las indicaciones del envase. Puede conseguirla en la tienda de comestibles o la farmacia local.  Bese con menos frecuencia, por ejemplo, da por medio.  Bese con agua templada. No use agua caliente. Cuidado de la venda  Si le colocaron una venda (vendaje), cmbiela como se lo haya indicado el mdico.  Lvese las manos con agua y [place] antes y despus de cambiar el vendaje. Use desinfectante para manos si no dispone de [place] y [place]. Indicaciones generales  Evite la sustancia que ha causado la erupcin. Si no sabe qu la caus, lleve un diario para tratar de identificar la causa. Escriba los siguientes datos: ? Lo que come. ? Los cosmticos que [place]. ? Lo que bebe. ? Lo que llev puesto en la zona afectada. [place] alhajas.  Controle todos los das las zonas afectadas para detectar signos de infeccin. Est atento a los siguientes signos: ? Aumento del enrojecimiento, la hinchazn o Conservation officer, historic buildings. ? Ms lquido Delorise Shiner. ? Calor. ? Pus o mal olor.  Concurra a todas las visitas de seguimiento como se lo haya indicado el mdico. Esto es importante. Comunquese con un mdico si:  La afeccin no mejora con tratamiento.  Su afeccin empeora.  Observa signos de infeccin, como hinchazn, sensibilidad, enrojecimiento, dolor o calor en la zona afectada.  Tiene fiebre.  Aparecen nuevos sntomas. Solicite ayuda inmediatamente si:  Tiene dolor de cabeza intenso o dolor o rigidez en el cuello.  Vomita.  Se siente muy somnoliento.  Nota una lnea roja en la piel que sale de la zona afectada.  El hueso o la articulacin que se encuentra por debajo de la zona afectada le duele despus de que la  piel ya se ha curado.  La zona afectada se oscurece.  Tiene dificultad para respirar. Resumen  La dermatitis es el enrojecimiento, el dolor y la hinchazn (inflamacin) de la piel. La dermatitis de contacto es una reaccin a ciertas sustancias que entran en contacto con la piel.  Los sntomas de esta afeccin pueden presentarse en cualquier parte del cuerpo con la que usted toque la sustancia irritante o donde la sustancia irritante lo haya tocado.  Esta afeccin se trata determinando la causa de la reaccin y protegiendo la piel de nuevos contactos. El tratamiento tambin puede incluir medicamentos y cuidado de la piel.  Evite la sustancia que ha causado la erupcin. Si no sabe qu la caus, lleve un diario para tratar de identificar la causa.  Comunquese con un mdico si su afeccin empeora o si presenta signos de infeccin, como hinchazn, sensibilidad, enrojecimiento, dolor o calor en la zona afectada. Esta informacin no tiene Marine scientist el consejo del mdico. Asegrese de hacerle al mdico cualquier pregunta que tenga. Document Released: 06/29/2005 Document Revised: 05/23/2018 Document Reviewed: 05/23/2018 Elsevier Patient Education  2020 [employer].

## 2019-04-02 NOTE — Telephone Encounter (Signed)
Patient states she would like to be prescribed the   clotrimazole and betamethasone cream. Instead of the cream she was prescribed. States that a provider she saw in the past states is was a fungal issue vs.eczema. Please follow up.

## 2019-04-02 NOTE — Telephone Encounter (Signed)
Will route to PCP for review. 

## 2019-04-03 NOTE — Telephone Encounter (Signed)
Good afternoon  I was not even able to visualize or palpate any bumps. ty for sending to Dr Margarita Rana

## 2019-05-13 ENCOUNTER — Other Ambulatory Visit: Payer: Self-pay

## 2019-05-13 ENCOUNTER — Ambulatory Visit: Payer: Self-pay | Attending: Family Medicine | Admitting: Family Medicine

## 2019-05-13 DIAGNOSIS — M545 Low back pain, unspecified: Secondary | ICD-10-CM

## 2019-05-13 DIAGNOSIS — R300 Dysuria: Secondary | ICD-10-CM

## 2019-05-13 DIAGNOSIS — R21 Rash and other nonspecific skin eruption: Secondary | ICD-10-CM

## 2019-05-13 LAB — POCT URINALYSIS DIP (CLINITEK)
Blood, UA: NEGATIVE
Glucose, UA: NEGATIVE mg/dL
Ketones, POC UA: NEGATIVE mg/dL
Nitrite, UA: NEGATIVE
Spec Grav, UA: 1.01 (ref 1.010–1.025)
Urobilinogen, UA: 0.2 E.U./dL
pH, UA: 7.5 (ref 5.0–8.0)

## 2019-05-13 MED ORDER — CEPHALEXIN 500 MG PO CAPS: 500.0000 mg | ORAL_CAPSULE | Freq: Two times a day (BID) | ORAL | 0 refills | Status: DC

## 2019-05-13 MED ORDER — TRIAMCINOLONE ACETONIDE 0.1 % EX CREA: 1.0000 "application " | TOPICAL_CREAM | Freq: Two times a day (BID) | CUTANEOUS | 2 refills | Status: DC

## 2019-05-13 MED FILL — CEPHALEXIN 500 MG CAPSULE: 500 | 7 days supply | Qty: 14 | Fill #0

## 2019-05-13 MED FILL — ?TRIAMCINOLONE 0.1% CRM: 0.1 | 15 days supply | Qty: 30 | Fill #0

## 2019-05-13 NOTE — Progress Notes (Signed)
Patient has been called and DOB has been verified. Patient has been screened and transferred to PCP to start phone visit.    Patient is having right side back pain and burning while urinating.

## 2019-05-13 NOTE — Progress Notes (Signed)
Virtual Visit via Telephone Note  I connected with [name], on 05/13/2019 at 9:51 AM by telephone due to the COVID-19 pandemic and verified that I am speaking with the correct person using two identifiers.   Consent: I discussed the limitations, risks, security and privacy concerns of performing an evaluation and management service by telephone and the availability of in person appointments. I also discussed with the patient that there may be a patient responsible charge related to this service. The patient expressed understanding and agreed to proceed.   Location of Patient: Home  Location of Provider: Clinic   Persons participating in Telemedicine visit: Thomasene Delapaz Orlena SheldonLeon Alicia Farrington-CMA Dr. Nelwyn SalisburyNewlin-PCP     History of Present Illness: 33 year old female who presents today with right-sided lower back pain, dysuria for the last 1 week.  Right-sided low back pain does not radiate down her lower extremities.  Denies vaginal itching, vaginal discharge and denies change in sexual partners recently.  Denies presence of fever, lower abdominal pain or burning Complains of hives on finger requests Dermatology referral as rash has been present for the last few days but states it has been intermittent over the course of 1 month. Denies itching, burning, drainage from lesions. She was triamcinolone cream by the nurse practitioner at her last office visit which she has run out of. She endorses using a lot of cleaning products at home without the use of gloves but is unsure if these are related to episodes of using those cleaning products.   Past Medical History:  Diagnosis Date  . Bulimia   . Bulimia nervosa   . Eating disorder   . GERD (gastroesophageal reflux disease)    No Known Allergies  Current Outpatient Medications on File Prior to Visit  Medication Sig Dispense Refill  . triamcinolone cream (KENALOG) 0.1 % Apply 1 application topically 2 (two) times daily. 30 g  0   No current facility-administered medications on file prior to visit.     Observations/Objective: Awake, alert, oriented x3 Not in acute distress  Assessment and Plan: 1. Dysuria Patient called in to drop off urine specimen Urinalysis positive for moderate leukocyte esterase Placed on Keflex and she has been called to inform her of Keflex being sent to pharmacy Increase fluid intake - POCT URINALYSIS DIP (CLINITEK) - cephALEXin (KEFLEX) 500 MG capsule; Take 1 capsule (500 mg total) by mouth 2 (two) times daily.  Dispense: 14 capsule; Refill: 0  2. Acute right-sided low back pain without sciatica Secondary to #1 above  3. dermatitis Likely contact irritant dermatitis If symptoms are unresolved on Triamcinolone she has been advised to call the clinic back and referral to dermatology be placed. - triamcinolone cream (KENALOG) 0.1 %; Apply 1 application topically 2 (two) times daily.  Dispense: 30 g; Refill: 2  Follow Up Instructions: Return if symptoms worsen or fail to improve.    I discussed the assessment and treatment plan with the patient. The patient was provided an opportunity to ask questions and all were answered. The patient agreed with the plan and demonstrated an understanding of the instructions.   The patient was advised to call back or seek an in-person evaluation if the symptoms worsen or if the condition fails to improve as anticipated.     I provided 15 minutes total of non-face-to-face time during this encounter including median intraservice time, reviewing previous notes, labs, imaging, medications, management and patient verbalized understanding.     Hoy RegisterEnobong [name], MD, FAAFP. [place]  [facility] and [place], [place]   05/13/2019, 9:51 AM

## 2019-05-15 ENCOUNTER — Telehealth: Payer: Self-pay

## 2019-05-15 ENCOUNTER — Other Ambulatory Visit: Payer: Self-pay

## 2019-05-15 ENCOUNTER — Ambulatory Visit: Payer: Self-pay | Attending: Family Medicine

## 2019-05-15 NOTE — Telephone Encounter (Signed)
Patient was called and informed that medication has been sent to pharmacy. 

## 2019-05-15 NOTE — Telephone Encounter (Signed)
-----   Message from Charlott Rakes, MD sent at 05/13/2019 11:12 AM EDT ----- Could you please inform her that she has a UTI and I have sent a prescription for Keflex to her pharmacy?  Thank you

## 2019-05-29 ENCOUNTER — Other Ambulatory Visit: Payer: Self-pay

## 2019-05-29 ENCOUNTER — Ambulatory Visit: Payer: Self-pay | Attending: Family Medicine | Admitting: Physician Assistant

## 2019-05-29 DIAGNOSIS — R109 Unspecified abdominal pain: Secondary | ICD-10-CM

## 2019-05-29 DIAGNOSIS — N390 Urinary tract infection, site not specified: Secondary | ICD-10-CM

## 2019-05-29 LAB — POCT URINALYSIS DIP (CLINITEK)
Bilirubin, UA: NEGATIVE
Blood, UA: NEGATIVE
Glucose, UA: NEGATIVE mg/dL
Ketones, POC UA: NEGATIVE mg/dL
Leukocytes, UA: NEGATIVE
Nitrite, UA: NEGATIVE
POC PROTEIN,UA: NEGATIVE
Spec Grav, UA: 1.015 (ref 1.010–1.025)
Urobilinogen, UA: 0.2 E.U./dL
pH, UA: 7.5 (ref 5.0–8.0)

## 2019-05-29 MED ORDER — IBUPROFEN 600 MG PO TABS: 600.0000 mg | ORAL_TABLET | Freq: Three times a day (TID) | ORAL | 0 refills | Status: DC | PRN

## 2019-05-29 MED FILL — ?IBUPROFEN 600 MG TABS: 600 | 20 days supply | Qty: 60 | Fill #0

## 2019-05-29 NOTE — Progress Notes (Signed)
Virtual Visit via Telephone Note  I connected with [name] on 05/29/19 at 10:50 AM EDT by telephone and verified that I am speaking with the correct person using two identifiers.   I discussed the limitations, risks, security and privacy concerns of performing an evaluation and management service by telephone and the availability of in person appointments. I also discussed with the patient that there may be a patient responsible charge related to this service. The patient expressed understanding and agreed to proceed.  Patient location: home My Location:  [place] office Persons on the call: the patient, me and interpreter Mariela  History of Present Illness: C/o R flank pain and dysuria X 2 weeks. Took cephalexin for 7 days.  No fever.  No abdominal pain.  No vaginal discharge.  No vaginal discharge.  LMP ~ 3 weeks ago.  On OCP for BC.  Denies STI risk factors    Observations/Objective:   A&Ox3   Assessment and Plan: 1. Right flank pain - Urine Culture - POCT urine pregnancy - ibuprofen (ADVIL) 600 MG tablet; Take 1 tablet (600 mg total) by mouth every 8 (eight) hours as needed.  Dispense: 60 tablet; Refill: 0  2. Urinary tract infection without hematuria, site unspecified Push water - POCT URINALYSIS DIP (CLINITEK)-no infection on dip - Urine cytology ancillary only - Urine Culture - POCT urine pregnancy  Follow Up Instructions: Prn/predicated on labs   I discussed the assessment and treatment plan with the patient. The patient was provided an opportunity to ask questions and all were answered. The patient agreed with the plan and demonstrated an understanding of the instructions.   The patient was advised to call back or seek an in-person evaluation if the symptoms worsen or if the condition fails to improve as anticipated.  I provided 13 minutes of non-face-to-face time during this encounter.   Freeman Caldron, PA-C  Patient ID: [name], female    DOB: 06/20/1986, 33 y.o.   MRN: [id number]

## 2019-05-29 NOTE — Progress Notes (Signed)
Patient verified DOB Patient has eaten and taken medication today. Patient complains right sided back pain and dysuria. Patient complains of pain being present for the past 2 weeks.

## 2019-06-01 LAB — URINE CYTOLOGY ANCILLARY ONLY
Chlamydia: NEGATIVE
Neisseria Gonorrhea: NEGATIVE
Trichomonas: NEGATIVE

## 2019-06-12 ENCOUNTER — Ambulatory Visit: Payer: Self-pay | Admitting: Family Medicine

## 2019-06-14 LAB — URINE CYTOLOGY ANCILLARY ONLY
Bacterial vaginitis: POSITIVE — AB
Candida vaginitis: NEGATIVE

## 2019-06-17 ENCOUNTER — Other Ambulatory Visit: Payer: Self-pay | Admitting: Physician Assistant

## 2019-06-17 MED ORDER — METRONIDAZOLE 500 MG PO TABS: 500.0000 mg | ORAL_TABLET | Freq: Two times a day (BID) | ORAL | 0 refills | Status: DC

## 2019-06-17 MED FILL — ?METRONIDAZOLE 500 MG TABS: 500 | 7 days supply | Qty: 14 | Fill #0

## 2019-06-18 ENCOUNTER — Telehealth: Payer: Self-pay | Admitting: *Deleted

## 2019-06-18 NOTE — Telephone Encounter (Signed)
Please call patient. Additional results show bacterial vaginitis. I sent Rx to the pharmacy. Follow-up as planned.  Thanks, Freeman Caldron, PA-C   ___________________________________ Pt name and DOB verified. Patient aware of results and result note per Levada Dy McClung,PA-CPatient aware to pick up medication at pharmacy.   Patient states she notices blood only with micturition. Does not notice any other time. She is taking OCP and is not due for menstrual cycle. She c/o ongoing pain in mid-back around kidney area that makes her nervous. Patient advised that message would be sent to Provider for next step.  OV for another evaluation or referral to specialist. Advised to take medication sent to pharmacy.   Please advise.     Interpreter assistance provided by [employer] [phone]

## 2019-06-19 NOTE — Telephone Encounter (Signed)
Please call patient.  Her last urine we checked did not show any blood.  Tell her to drink 80-100 ounces of water daily.  If symptoms persist, schedule an appt.

## 2019-06-19 NOTE — Telephone Encounter (Signed)
[employer] interpreter utilized for translationSalena [name], [phone] Patient aware of the message per [facility].

## 2019-06-21 MED FILL — OMEPRAZOLE DR 40 MG CAPSULE: 40 | 60 days supply | Qty: 60 | Fill #0

## 2019-07-10 ENCOUNTER — Ambulatory Visit: Payer: Self-pay | Attending: Family Medicine | Admitting: Family Medicine

## 2019-08-01 ENCOUNTER — Ambulatory Visit: Payer: Self-pay

## 2019-08-09 ENCOUNTER — Encounter: Payer: Self-pay | Admitting: Internal Medicine

## 2019-08-09 ENCOUNTER — Other Ambulatory Visit: Payer: Self-pay

## 2019-08-09 ENCOUNTER — Ambulatory Visit: Payer: Self-pay | Attending: Internal Medicine | Admitting: Internal Medicine

## 2019-08-09 VITALS — BP 127/87 | HR 84 | Temp 98.9°F | Resp 16 | Wt 127.0 lb

## 2019-08-09 DIAGNOSIS — M545 Low back pain, unspecified: Secondary | ICD-10-CM

## 2019-08-09 DIAGNOSIS — R1031 Right lower quadrant pain: Secondary | ICD-10-CM

## 2019-08-09 DIAGNOSIS — R300 Dysuria: Secondary | ICD-10-CM

## 2019-08-09 LAB — POCT URINALYSIS DIP (CLINITEK)
Bilirubin, UA: NEGATIVE
Blood, UA: NEGATIVE
Glucose, UA: NEGATIVE mg/dL
Ketones, POC UA: NEGATIVE mg/dL
Nitrite, UA: NEGATIVE
POC PROTEIN,UA: NEGATIVE
Spec Grav, UA: 1.025 (ref 1.010–1.025)
Urobilinogen, UA: 0.2 E.U./dL
pH, UA: 6.5 (ref 5.0–8.0)

## 2019-08-09 LAB — POCT URINE PREGNANCY: Preg Test, Ur: NEGATIVE

## 2019-08-09 MED ORDER — CIPROFLOXACIN HCL 500 MG PO TABS: 500.0000 mg | ORAL_TABLET | Freq: Two times a day (BID) | ORAL | 0 refills | Status: DC

## 2019-08-09 MED FILL — CIPROFLOXACIN HCL 500 MG TA: 500 | 3 days supply | Qty: 6 | Fill #0

## 2019-08-09 NOTE — Patient Instructions (Signed)
Influenza Virus Vaccine injection (Fluarix) °¿Qué es este medicamento? °La VACUNA ANTIGRIPAL ayuda a disminuir el riesgo de contraer la influenza, también conocida como la gripe. La vacuna solo ayuda a protegerle contra algunas cepas de influenza. Esta vacuna no ayuda a reducir el riesgo de contraer influenza pandémica H1N1. °Este medicamento puede ser utilizado para otros usos; si tiene alguna pregunta consulte con su proveedor de atención médica o con su farmacéutico. °MARCAS COMUNES: Fluarix, Fluzone °¿Qué le debo informar a mi profesional de la salud antes de tomar este medicamento? °Necesita saber si usted presenta alguno de los siguientes problemas o situaciones: °· trastorno de sangrado como hemofilia °· fiebre o infección °· síndrome de Guillain-Barre u otros problemas neurológicos °· problemas del sistema inmunológico °· infección por el virus de la inmunodeficiencia humana (VIH) o SIDA °· niveles bajos de plaquetas en la sangre °· esclerosis múltiple °· una reacción alérgica o inusual a las vacunas antigripales, a los huevos, proteínas de pollo, al látex, a la gentamicina, a otros medicamentos, alimentos, colorantes o conservantes °· si está embarazada o buscando quedar embarazada °· si está amamantando a un bebé °¿Cómo debo utilizar este medicamento? °Esta vacuna se administra mediante inyección por vía intramuscular. Lo administra un profesional de la salud. °Recibirá una copia de información escrita sobre la vacuna antes de cada vacuna. Asegúrese de leer este folleto cada vez cuidadosamente. Este folleto puede cambiar con frecuencia. °Hable con su pediatra para informarse acerca del uso de este medicamento en niños. Puede requerir atención especial. °Sobredosis: Póngase en contacto inmediatamente con un centro toxicológico o una sala de urgencia si usted cree que haya tomado demasiado medicamento. °ATENCIÓN: Este medicamento es solo para usted. No comparta este medicamento con nadie. °¿Qué sucede si me  olvido de una dosis? °No se aplica en este caso. °¿Qué puede interactuar con este medicamento? °· quimioterapia o radioterapia °· medicamentos que suprimen el sistema inmunológico, tales como etanercept, anakinra, infliximab y adalimumab °· medicamentos que tratan o previenen coágulos sanguíneos, como warfarina °· fenitoína °· medicamentos esteroideos, como la prednisona o la cortisona °· teofilina °· vacunas °Puede ser que esta lista no menciona todas las posibles interacciones. Informe a su profesional de la salud de todos los productos a base de hierbas, medicamentos de venta libre o suplementos nutritivos que esté tomando. Si usted fuma, consume bebidas alcohólicas o si utiliza drogas ilegales, indíqueselo también a su profesional de la salud. Algunas sustancias pueden interactuar con su medicamento. °¿A qué debo estar atento al usar este medicamento? °Informe a su médico o a su profesional de la salud sobre todos los efectos secundarios que persistan después de 3 días. Llame a su proveedor de atención médica si se presentan síntomas inusuales dentro de las 6 semanas posteriores a la vacunación. °Es posible que todavía pueda contraer la gripe, pero la enfermedad no será tan fuerte como normalmente. No puede contraer la gripe de esta vacuna. La vacuna antigripal no le protege contra resfríos u otras enfermedades que pueden causar fiebre. Debe vacunarse cada año. °¿Qué efectos secundarios puedo tener al utilizar este medicamento? °Efectos secundarios que debe informar a su médico o a su profesional de la salud tan pronto como sea posible: °· reacciones alérgicas como erupción cutánea, picazón o urticarias, hinchazón de la cara, labios o lengua °Efectos secundarios que, por lo general, no requieren atención médica (debe informarlos a su médico o a su profesional de la salud si persisten o si son molestos): °· fiebre °· dolor de cabeza °· molestias y dolores musculares °·   dolor, sensibilidad, enrojecimiento o  hinchazón en el lugar de la inyección °· cansancio o debilidad °Puede ser que esta lista no menciona todos los posibles efectos secundarios. Comuníquese a su médico por asesoramiento médico sobre los efectos secundarios. Usted puede informar los efectos secundarios a la FDA por teléfono al 1-800-FDA-1088. °¿Dónde debo guardar mi medicina? °Esta vacuna se administra solamente en clínicas, farmacias, consultorio médico u otro consultorio de un profesional de la salud y no necesitará guardarlo en su domicilio. °ATENCIÓN: Este folleto es un resumen. Puede ser que no cubra toda la posible información. Si usted tiene preguntas acerca de esta medicina, consulte con su médico, su farmacéutico o su profesional de la salud. °© 2020 Elsevier/Gold Standard (2010-03-23 15:31:40) ° °

## 2019-08-09 NOTE — Progress Notes (Signed)
Patient ID: [name], female    DOB: 12/31/1985  MRN: [id number]  CC: Abdominal Pain   Subjective: [name] is a 33 y.o. female who presents for UC visit Her concerns today include:   "C/o bad pain on my lower back that goes to my belly as well."  Pain mainly on RT lumbar region.  Pain radiates around to RT lower abdomen.   No initiating factors.   Duration: 4 days  Pain constant No N/V/D or fever.  + dysuria but no increase frequency.  No hematuria. LNMP 08/02/2019.  No abnormal vaginal dischg.  Sexually active with one partner.  [facility]  Taking Tylenol only because Ibuprofen irritates stomach.  Takes Tylenol Q 4 hrs up to 4 at a time.  Rates pain 9/10.   Patient Active Problem List   Diagnosis Date Noted  . Eczema 07/10/2017  . Pregnancy 04/12/2014  . Atypical chest pain 05/15/2013  . Left arm pain 05/15/2013  . Starvation ketoacidosis 04/12/2013  . Syncope 04/12/2013  . Hypokalemia 04/12/2013  . Protein-calorie malnutrition, severe ([place]) 04/12/2013  . Prolonged Q-T interval on ECG 04/12/2013  . Depression 04/16/2012  . Underweight 03/20/2012  . Bulimia nervosa 03/20/2012  . Amenorrhea, secondary 03/20/2012     Current Outpatient Medications on File Prior to Visit  Medication Sig Dispense Refill  . ibuprofen (ADVIL) 600 MG tablet Take 1 tablet (600 mg total) by mouth every 8 (eight) hours as needed. 60 tablet 0  . metroNIDAZOLE (FLAGYL) 500 MG tablet Take 1 tablet (500 mg total) by mouth 2 (two) times daily. 14 tablet 0  . triamcinolone cream (KENALOG) 0.1 % Apply 1 application topically 2 (two) times daily. 30 g 2   No current facility-administered medications on file prior to visit.     No Known Allergies  Social History   Socioeconomic History  . Marital status: Married    Spouse name: Not on file  . Number of children: Not on file  . Years of education: Not on file  . Highest education level: Not on  file  Occupational History  . Not on file  Social Needs  . Financial resource strain: Not on file  . Food insecurity    Worry: Not on file    Inability: Not on file  . Transportation needs    Medical: Not on file    Non-medical: Not on file  Tobacco Use  . Smoking status: Never Smoker  . Smokeless tobacco: Never Used  Substance and Sexual Activity  . Alcohol use: No  . Drug use: No  . Sexual activity: Yes    Birth control/protection: Implant  Lifestyle  . Physical activity    Days per week: Not on file    Minutes per session: Not on file  . Stress: Not on file  Relationships  . Social Herbalist on phone: Not on file    Gets together: Not on file    Attends religious service: Not on file    Active member of club or organization: Not on file    Attends meetings of clubs or organizations: Not on file    Relationship status: Not on file  . Intimate partner violence    Fear of current or ex partner: Not on file    Emotionally abused: Not on file    Physically abused: Not on file    Forced sexual activity: Not on file  Other Topics  Concern  . Not on file  Social History Narrative   ** Merged History Encounter **       Lives in [place] since 2005 with husband and 2 children (2011, 2008). Moved from [place] in 2005. Spanish speaking.     Family History  Problem Relation Age of Onset  . Diabetes Father   . Hearing loss Neg Hx   . Cancer Neg Hx   . Heart failure Neg Hx     Past Surgical History:  Procedure Laterality Date  . COLONOSCOPY WITH PROPOFOL N/A 03/18/2016   Procedure: COLONOSCOPY WITH PROPOFOL;  Surgeon: Vida RiggerMarc Magod, MD;  Location: [facility] ENDOSCOPY;  Service: Endoscopy;  Laterality: N/A;  . ESOPHAGOGASTRODUODENOSCOPY (EGD) WITH PROPOFOL N/A 09/22/2017   Procedure: ESOPHAGOGASTRODUODENOSCOPY (EGD) WITH PROPOFOL;  Surgeon: Vida RiggerMagod, Marc, MD;  [facility];  Service: Endoscopy;  Laterality: N/A;  . NO PAST SURGERIES      ROS: Review of  Systems Negative except as stated above  PHYSICAL EXAM: BP 127/87   Pulse 84   Temp 98.9 F (37.2 C) (Oral)   Resp 16   Wt 127 lb (57.6 kg)   SpO2 99%   BMI 22.50 kg/m   Physical Exam General appearance - alert, well appearing, young Hispanic female and in no distress.  Patient is nontoxic looking. Mental status - normal mood, behavior, speech, dress, motor activity, and thought processes Abdomen -normal bowel sounds, nondistended, soft, no organomegaly.  Mild tenderness across the lower abdomen and suprapubic area without guarding or rebound.   Pelvic -CMA Julius Bowelsollock is present.  She has mild to moderate thick clear to yellowish discharge around the cervix.  No cervical motion tenderness or adnexal masses/tenderness on palpation MSK: Mild bilateral flank tenderness   CMP Latest Ref Rng & Units 01/21/2019 06/06/2018 04/14/2018  Glucose 65 - 99 mg/dL 84 84 829(F100(H)  BUN 6 - 20 mg/dL 8 9 7   Creatinine 0.57 - 1.00 mg/dL 6.210.77 3.080.59 6.570.79  Sodium 134 - 144 mmol/L 135 141 138  Potassium 3.5 - 5.2 mmol/L 4.5 4.3 4.2  Chloride 96 - 106 mmol/L 99 103 106  CO2 20 - 29 mmol/L 23 22 23   Calcium 8.7 - 10.2 mg/dL 9.6 9.4 9.4  Total Protein 6.5 - 8.1 g/dL - - -  Total Bilirubin 0.3 - 1.2 mg/dL - - -  Alkaline Phos 38 - 126 U/L - - -  AST 15 - 41 U/L - - -  ALT 14 - 54 U/L - - -   Lipid Panel  No results found for: CHOL, TRIG, HDL, CHOLHDL, VLDL, LDLCALC, LDLDIRECT  CBC    Component Value Date/Time   WBC 5.8 04/14/2018 0638   RBC 4.40 04/14/2018 0638   HGB 13.3 04/14/2018 0638   HGB 13.4 02/07/2018 1657   HCT 39.5 04/14/2018 0638   HCT 39.0 02/07/2018 1657   PLT 325 04/14/2018 0638   PLT 350 02/07/2018 1657   MCV 89.8 04/14/2018 0638   MCV 87 02/07/2018 1657   MCH 30.2 04/14/2018 0638   MCHC 33.7 04/14/2018 0638   RDW 12.3 04/14/2018 0638   RDW 13.1 02/07/2018 1657   LYMPHSABS 2.7 02/07/2018 1657   MONOABS 0.3 10/02/2017 1139   EOSABS 0.4 02/07/2018 1657   BASOSABS 0.0 02/07/2018 1657    Results for orders placed or performed in visit on 08/09/19  POCT URINALYSIS DIP (CLINITEK)  Result Value Ref Range   Color, UA yellow yellow   Clarity, UA clear clear   Glucose, UA  negative negative mg/dL   Bilirubin, UA negative negative   Ketones, POC UA negative negative mg/dL   Spec Grav, UA 2.130 8.657 - 1.025   Blood, UA negative negative   pH, UA 6.5 5.0 - 8.0   POC PROTEIN,UA negative negative, trace   Urobilinogen, UA 0.2 0.2 or 1.0 E.U./dL   Nitrite, UA Negative Negative   Leukocytes, UA Trace (A) Negative  POCT urine pregnancy  Result Value Ref Range   Preg Test, Ur Negative Negative    ASSESSMENT AND PLAN:  1. Acute right-sided low back pain without sciatica 2. Dysuria 3. Right lower quadrant abdominal pain - POCT URINALYSIS DIP (CLINITEK) - Cervicovaginal ancillary only Differential diagnosis include early UTI, vs stone versus ruptured ovarian cyst versus appendix.  Given the dysuria, I will treat this as an early UTI.  I have sent vaginal secretions to be checked for GC and chlamydia.  Advised patient to be seen in the emergency room if pain is not improving or worsens within the next 24 to 48 hours.  Patient was given the opportunity to ask questions.  Patient verbalized understanding of the plan and was able to repeat key elements of the plan.  [employer] interpreter used during this encounter. #[id number]  Orders Placed This Encounter  Procedures  . POCT URINALYSIS DIP (CLINITEK)  . POCT urine pregnancy     Requested Prescriptions   Signed Prescriptions Disp Refills  . ciprofloxacin (CIPRO) 500 MG tablet 6 tablet 0    Sig: Take 1 tablet (500 mg total) by mouth 2 (two) times daily.    No follow-ups on file.  Jonah Blue, MD, FACP

## 2019-08-12 ENCOUNTER — Telehealth: Payer: Self-pay

## 2019-08-12 LAB — CERVICOVAGINAL ANCILLARY ONLY
Chlamydia: NEGATIVE
Comment: NEGATIVE
Comment: NEGATIVE
Comment: NORMAL
Neisseria Gonorrhea: NEGATIVE
Trichomonas: NEGATIVE

## 2019-08-12 NOTE — Telephone Encounter (Signed)
[employer] [place]  Id#  [id number] contacted pt to go over lab results pt is aware and doesn't have any questions or concerns

## 2019-08-14 MED FILL — OMEPRAZOLE DR 40 MG CAPSULE: 40 | 30 days supply | Qty: 60 | Fill #1

## 2019-08-25 IMAGING — CT CT HEAD W/O CM
4 series · 17 of 47 positions shown, 19 images · non-contrast
Comparison: None.

CLINICAL DATA: Left-sided weakness for 1 week

EXAM:
CT HEAD WITHOUT CONTRAST
TECHNIQUE: Contiguous axial images were obtained from the base of the skull
through the vertex without intravenous contrast.

[Series 3: head wo · axial · 0.38mm/px · z∈[-66,+54]mm · 7 of 33 slices shown, 9 images]
[im 5/33  brain]
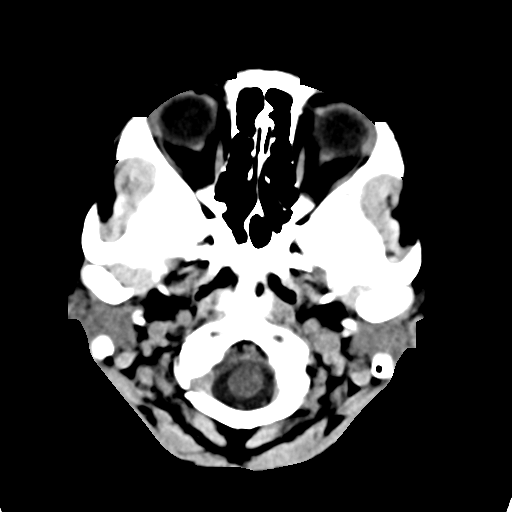
[im 5/33  bone]
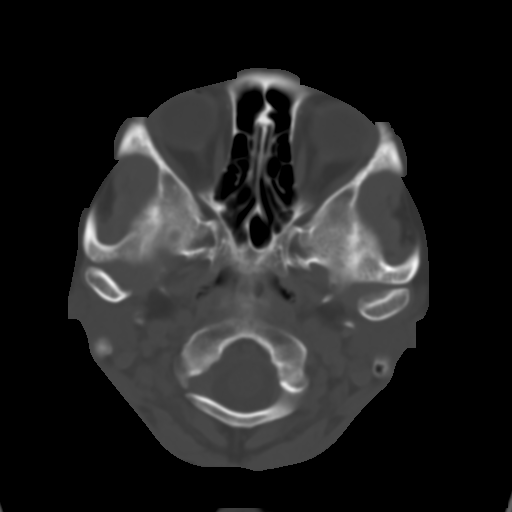
[im 9/33  brain]
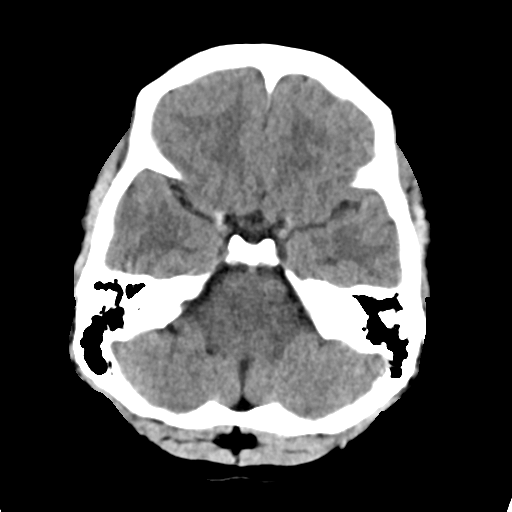
[im 13/33  brain]
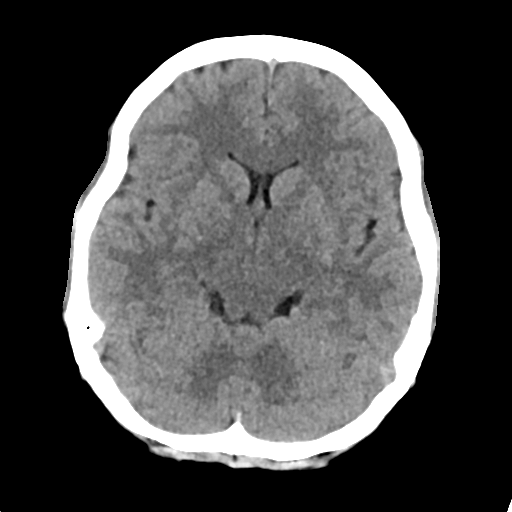
[im 17/33  brain]
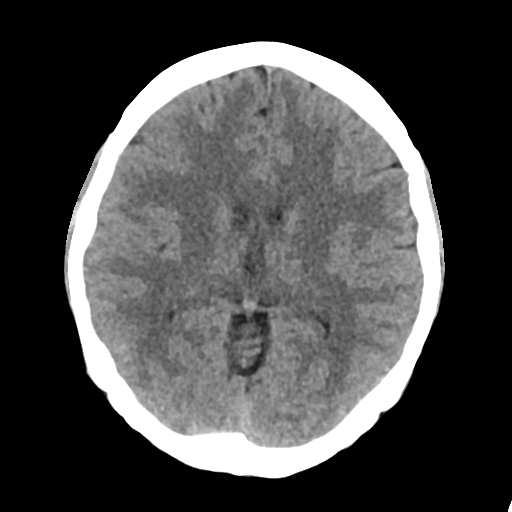
[im 21/33  brain]
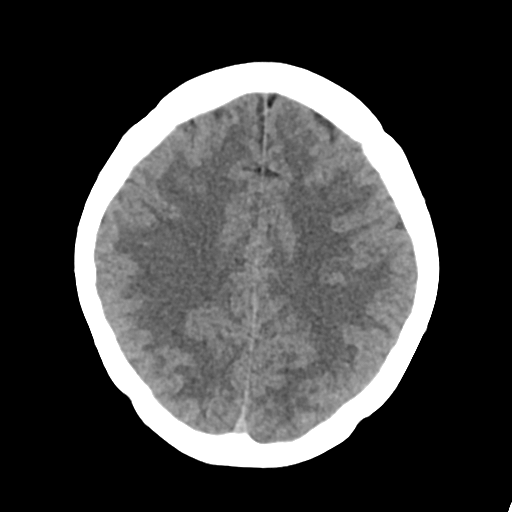
[im 21/33  bone]
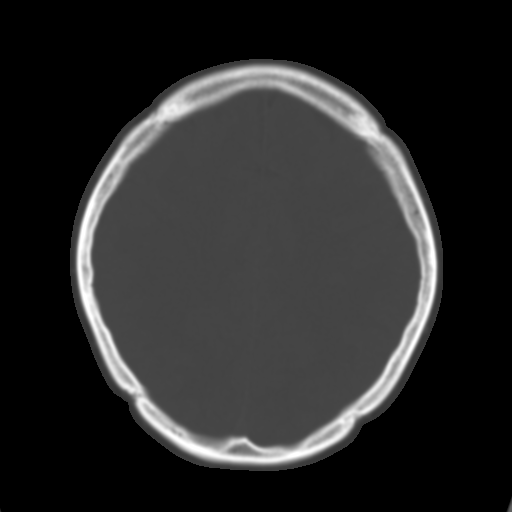
[im 25/33  brain]
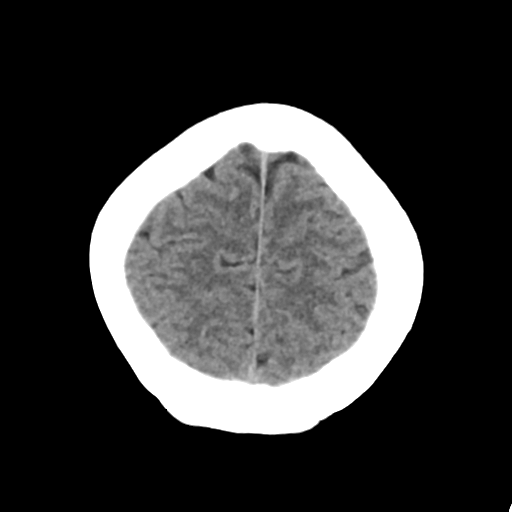
[im 29/33  brain]
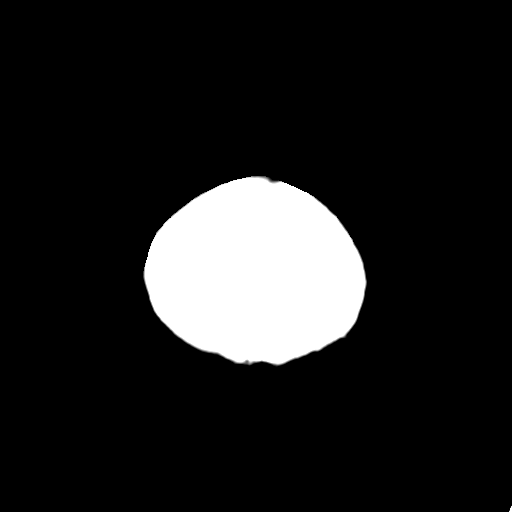

[Series 4: head bone · axial · 0.39mm/px · z∈[-70,-14]mm · 4 of 82 slices shown]
[im 9/82  bone]
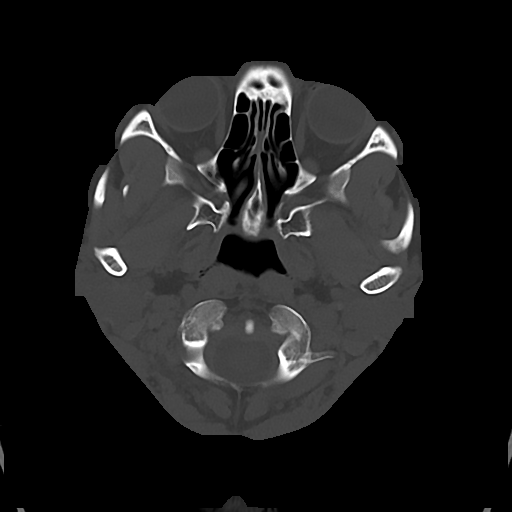
[im 17/82  bone]
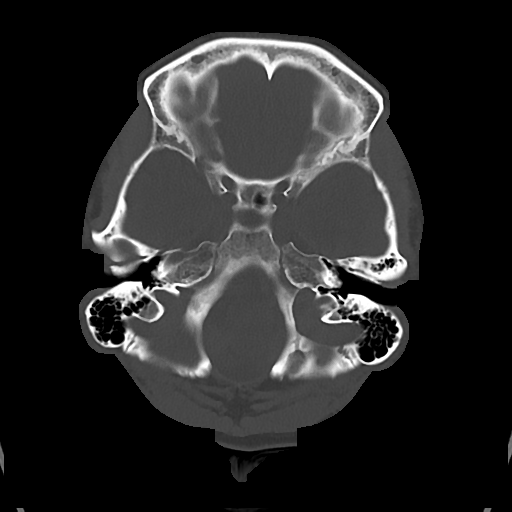
[im 25/82  bone]
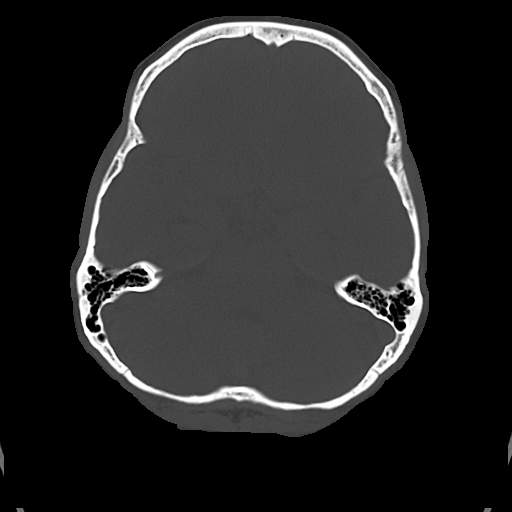
[im 37/82  bone]
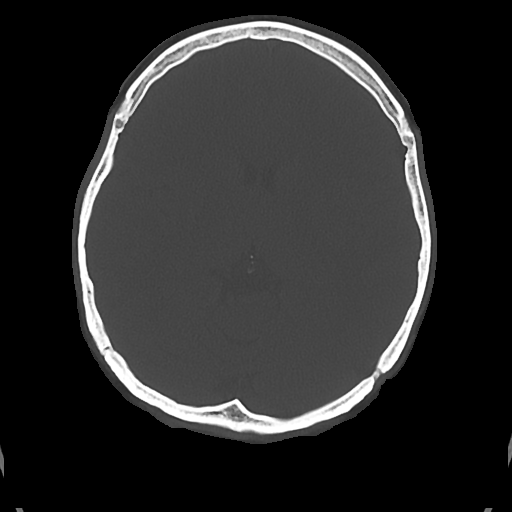

[Series 5: cor soft · coronal · 0.32mm/px · 3 of 67 slices shown]
[im 23/67  brain]
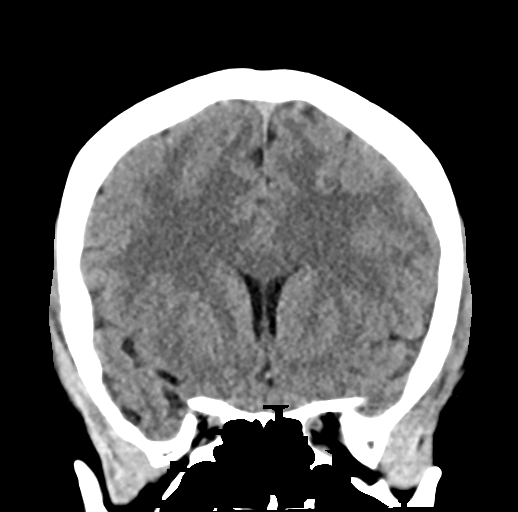
[im 30/67  brain]
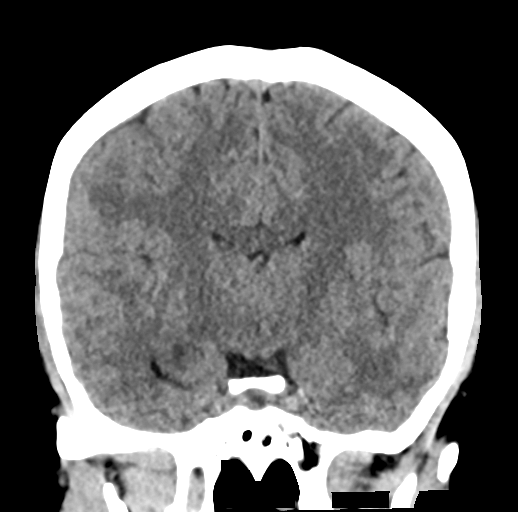
[im 37/67  brain]
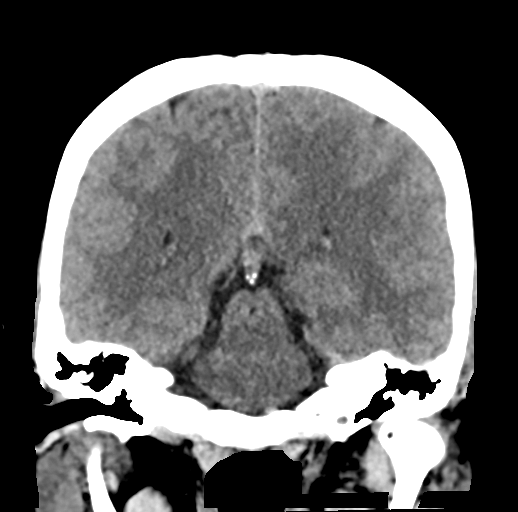

[Series 6: sag soft · sagittal · 0.32mm/px · 3 of 54 slices shown]
[im 18/54  brain]
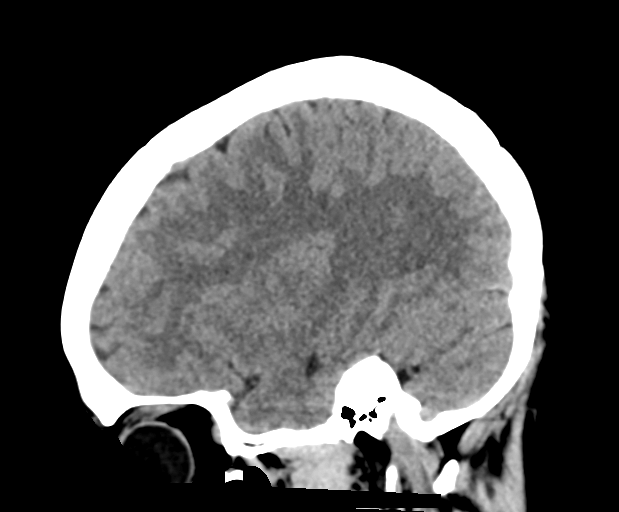
[im 27/54  brain]
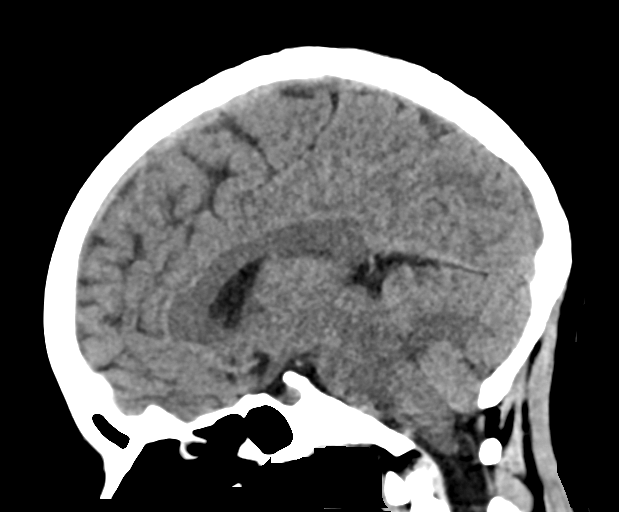
[im 36/54  brain]
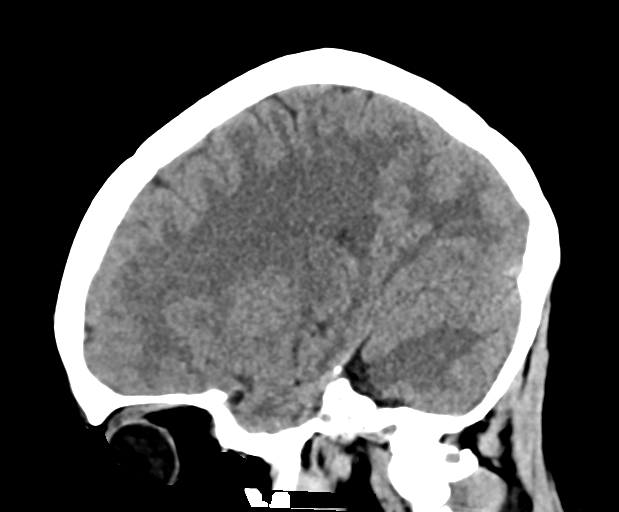

[17 of 47 positions shown; findings below may reference images not displayed]

FINDINGS: Brain: The ventricles are normal in size and configuration. There is
no intracranial mass, hemorrhage, extra-axial fluid collection, or
midline shift. Gray-white compartments appear normal. No evident
acute infarct.

Vascular: No hyperdense vessel.  No vascular calcification evident.

Skull: Bony calvarium appears intact.

Sinuses/Orbits: Visualized paranasal sinuses are clear. There is
leftward deviation of the nasal septum. Visualized orbits appear
symmetric bilaterally.

Other: Mastoid air cells are clear.
IMPRESSION: Leftward deviation nasal septum.  Study otherwise unremarkable.

## 2019-09-25 ENCOUNTER — Other Ambulatory Visit: Payer: Self-pay

## 2019-09-25 ENCOUNTER — Ambulatory Visit: Payer: Self-pay | Attending: Family Medicine | Admitting: Physician Assistant

## 2019-09-25 DIAGNOSIS — L509 Urticaria, unspecified: Secondary | ICD-10-CM

## 2019-09-25 DIAGNOSIS — L508 Other urticaria: Secondary | ICD-10-CM

## 2019-09-25 DIAGNOSIS — Z789 Other specified health status: Secondary | ICD-10-CM

## 2019-09-25 MED ORDER — CETIRIZINE HCL 10 MG PO TABS: 10.0000 mg | ORAL_TABLET | Freq: Every day | ORAL | 11 refills | Status: DC

## 2019-09-25 MED ORDER — PREDNISONE 10 MG PO TABS: 10.0000 mg | ORAL_TABLET | Freq: Every day | ORAL | 0 refills | Status: DC

## 2019-09-25 MED FILL — ?PREDNISONE 10 MG TABLET: 10 | 7 days supply | Qty: 21 | Fill #0

## 2019-09-25 MED FILL — ?CETIRIZINE HCL 10 MG TABLE: 10 | 30 days supply | Qty: 30 | Fill #0

## 2019-09-25 NOTE — Progress Notes (Signed)
Pt. Stated she have hives on her body. Pt. Stated it'll be big hives and they are now gone.

## 2019-09-25 NOTE — Progress Notes (Signed)
Virtual Visit via Telephone Note  I connected with [name] on 09/25/19 at  3:50 PM EST by telephone and verified that I am speaking with the correct person using two identifiers.   I discussed the limitations, risks, security and privacy concerns of performing an evaluation and management service by telephone and the availability of in person appointments. I also discussed with the patient that there may be a patient responsible charge related to this service. The patient expressed understanding and agreed to proceed.  Patient location:  home My Location:  [place] office Persons on the call:  Me and the patient and interpreter(Laura)   History of Present Illness:  Hives on and off for about 1 week.  No recent/new medications.  No new soaps or detergents.  No fever.  No different foods.  Taking otc benadryl with some relief.  No sob.  These occur all over her whole body.      Observations/Objective:  NAD.  A&Ox3   Assessment and Plan: 1. Hives Try zyrtec daily;  If that alone fails, start prednisone.    2. Language barrier [employer] interpre[employer] used and additional time performing visit was required.    Follow Up Instructions: prn   I discussed the assessment and treatment plan with the patient. The patient was provided an opportunity to ask questions and all were answered. The patient agreed with the plan and demonstrated an understanding of the instructions.   The patient was advised to call back or seek an in-person evaluation if the symptoms worsen or if the condition fails to improve as anticipated.  I provided 12 minutes of non-face-to-face time during this encounter.   Freeman Caldron, PA-C  Patient ID: [name], female   DOB: 05/13/1986, 33 y.o.   MRN: [id number]

## 2019-10-23 MED FILL — OMEPRAZOLE DR 40 MG CAPSULE: 40 | 30 days supply | Qty: 60 | Fill #2

## 2019-10-29 ENCOUNTER — Telehealth: Payer: Self-pay | Admitting: Family Medicine

## 2019-10-29 NOTE — Telephone Encounter (Signed)
Routing message to PCP

## 2019-10-29 NOTE — Telephone Encounter (Signed)
Returned call . Spoke with patient c /o numbness of the left leg since Sunday. Denies any injury tho the leg. Denies any pain associated with or swelling of the legs. Denies SOB, diff breathing, chest pains, or calf pain/  Instructed pt to keep appt with Dr Alvis Lemmings and if symptoms do not subside or worsen or symptoms are associated with weakness, confusion, difficulty talking or paralysis to go to Urgent care or Ed. Verbalized understanding. Please advice if any additional info needs to be given.

## 2019-10-29 NOTE — Telephone Encounter (Signed)
Patient called saying numbness on left leg. Patient says is all the time and does not go away. Please f/u

## 2019-10-29 NOTE — Telephone Encounter (Signed)
Adressed

## 2019-11-05 ENCOUNTER — Other Ambulatory Visit: Payer: Self-pay

## 2019-11-05 ENCOUNTER — Ambulatory Visit: Payer: Self-pay | Attending: Family Medicine | Admitting: Family Medicine

## 2019-11-05 ENCOUNTER — Encounter: Payer: Self-pay | Admitting: Family Medicine

## 2019-11-05 VITALS — BP 103/70 | HR 88 | Temp 98.7°F | Resp 18 | Ht 63.0 in | Wt 126.0 lb

## 2019-11-05 DIAGNOSIS — R300 Dysuria: Secondary | ICD-10-CM

## 2019-11-05 DIAGNOSIS — R3989 Other symptoms and signs involving the genitourinary system: Secondary | ICD-10-CM

## 2019-11-05 DIAGNOSIS — R399 Unspecified symptoms and signs involving the genitourinary system: Secondary | ICD-10-CM

## 2019-11-05 DIAGNOSIS — Z8742 Personal history of other diseases of the female genital tract: Secondary | ICD-10-CM

## 2019-11-05 DIAGNOSIS — R202 Paresthesia of skin: Secondary | ICD-10-CM

## 2019-11-05 LAB — POCT URINALYSIS DIP (CLINITEK)
Bilirubin, UA: NEGATIVE
Blood, UA: NEGATIVE
Glucose, UA: NEGATIVE mg/dL
Ketones, POC UA: NEGATIVE mg/dL
Nitrite, UA: NEGATIVE
POC PROTEIN,UA: NEGATIVE
Spec Grav, UA: 1.02 (ref 1.010–1.025)
Urobilinogen, UA: 0.2 E.U./dL
pH, UA: 8.5 — AB (ref 5.0–8.0)

## 2019-11-05 LAB — POCT GLYCOSYLATED HEMOGLOBIN (HGB A1C): Hemoglobin A1C: 5.2 % (ref 4.0–5.6)

## 2019-11-05 NOTE — Progress Notes (Signed)
Subjective:  Patient ID: [name], female    DOB: 01/08/1986  Age: 34 y.o. MRN: [id number]  CC: Diabetes   HPI [name] is a 34 year old female who presents today with the following concerns. She complains of dysuria, frequency but no abdominal pain or fever.  Has also had numbness in her left thigh and left forearm for 2 weeks. [facility] and was advised she needed to be evaluated for DM. She has a yellow thick discharge without a smell. She currently has one sexual partner - her husband.and she does not suspect infidelity. LMP 10/18/19.  Past Medical History:  Diagnosis Date  . Bulimia   . Bulimia nervosa   . Eating disorder   . GERD (gastroesophageal reflux disease)     Past Surgical History:  Procedure Laterality Date  . COLONOSCOPY WITH PROPOFOL N/A 03/18/2016   Procedure: COLONOSCOPY WITH PROPOFOL;  Surgeon: Vida Rigger, MD;  Location: [facility];  Service: Endoscopy;  Laterality: N/A;  . ESOPHAGOGASTRODUODENOSCOPY (EGD) WITH PROPOFOL N/A 09/22/2017   Procedure: ESOPHAGOGASTRODUODENOSCOPY (EGD) WITH PROPOFOL;  Surgeon: Vida Rigger, MD;  Location: [facility] ENDOSCOPY;  Service: Endoscopy;  Laterality: N/A;  . NO PAST SURGERIES      Family History  Problem Relation Age of Onset  . Diabetes Father   . Hearing loss Neg Hx   . Cancer Neg Hx   . Heart failure Neg Hx     No Known Allergies  Outpatient Medications Prior to Visit  Medication Sig Dispense Refill  . cetirizine (ZYRTEC) 10 MG tablet Take 1 tablet (10 mg total) by mouth daily. 30 tablet 11  . triamcinolone cream (KENALOG) 0.1 % Apply 1 application topically 2 (two) times daily. 30 g 2  . predniSONE (DELTASONE) 10 MG tablet Take 1 tablet (10 mg total) by mouth daily with breakfast. 6,5,4,3,2,1 take each days dose in the morning with food. 21 tablet 0   No facility-administered medications prior to visit.     ROS Review of  Systems  Constitutional: Negative for activity change, appetite change and fatigue.  HENT: Negative for congestion, sinus pressure and sore throat.   Eyes: Negative for visual disturbance.  Respiratory: Negative for cough, chest tightness, shortness of breath and wheezing.   Cardiovascular: Negative for chest pain and palpitations.  Gastrointestinal: Negative for abdominal distention, abdominal pain and constipation.  Endocrine: Negative for polydipsia.  Genitourinary: Negative for dysuria and frequency.  Musculoskeletal: Negative for arthralgias and back pain.  Skin: Negative for rash.  Neurological: Negative for tremors, light-headedness and numbness.  Hematological: Does not bruise/bleed easily.  Psychiatric/Behavioral: Negative for agitation and behavioral problems.    Objective:  BP 103/70 (BP Location: Left Arm, Patient Position: Sitting, Cuff Size: Normal)   Pulse 88   Temp 98.7 F (37.1 C) (Oral)   Resp 18   Ht 5\' 3"  (1.6 m)   Wt 126 lb (57.2 kg)   SpO2 100%   BMI 22.32 kg/m   BP/Weight 11/05/2019 08/09/2019 04/02/2019  Systolic BP 103 127 116  Diastolic BP 70 87 80  Wt. (Lbs) 126 127 117.6  BMI 22.32 22.5 20.83      Physical Exam Constitutional:      Appearance: She is well-developed.  Neck:     Vascular: No JVD.  Cardiovascular:     Rate and Rhythm: Normal rate.     Heart sounds: Normal heart sounds. No murmur.  Pulmonary:     Effort: Pulmonary effort  is normal.     Breath sounds: Normal breath sounds. No wheezing or rales.  Chest:     Chest wall: No tenderness.  Abdominal:     General: Bowel sounds are normal. There is no distension.     Palpations: Abdomen is soft. There is no mass.     Tenderness: There is no abdominal tenderness. There is no right CVA tenderness or left CVA tenderness.  Musculoskeletal:        General: Normal range of motion.     Right lower leg: No edema.     Left lower leg: No edema.  Neurological:     Mental Status: She is alert  and oriented to person, place, and time.  Psychiatric:        Mood and Affect: Mood normal.     CMP Latest Ref Rng & Units 01/21/2019 06/06/2018 04/14/2018  Glucose 65 - 99 mg/dL 84 84 100(H)  BUN 6 - 20 mg/dL 8 9 7   Creatinine 0.57 - 1.00 mg/dL 0.77 0.59 0.79  Sodium 134 - 144 mmol/L 135 141 138  Potassium 3.5 - 5.2 mmol/L 4.5 4.3 4.2  Chloride 96 - 106 mmol/L 99 103 106  CO2 20 - 29 mmol/L 23 22 23   Calcium 8.7 - 10.2 mg/dL 9.6 9.4 9.4  Total Protein 6.5 - 8.1 g/dL - - -  Total Bilirubin 0.3 - 1.2 mg/dL - - -  Alkaline Phos 38 - 126 U/L - - -  AST 15 - 41 U/L - - -  ALT 14 - 54 U/L - - -    Lipid Panel  No results found for: CHOL, TRIG, HDL, CHOLHDL, VLDL, LDLCALC, LDLDIRECT  CBC    Component Value Date/Time   WBC 5.8 04/14/2018 0638   RBC 4.40 04/14/2018 0638   HGB 13.3 04/14/2018 0638   HGB 13.4 02/07/2018 1657   HCT 39.5 04/14/2018 0638   HCT 39.0 02/07/2018 1657   PLT 325 04/14/2018 0638   PLT 350 02/07/2018 1657   MCV 89.8 04/14/2018 0638   MCV 87 02/07/2018 1657   MCH 30.2 04/14/2018 0638   MCHC 33.7 04/14/2018 0638   RDW 12.3 04/14/2018 0638   RDW 13.1 02/07/2018 1657   LYMPHSABS 2.7 02/07/2018 1657   MONOABS 0.3 10/02/2017 1139   EOSABS 0.4 02/07/2018 1657   BASOSABS 0.0 02/07/2018 1657    Lab Results  Component Value Date   HGBA1C 5.2 11/05/2019    Assessment & Plan:  1. Paresthesia A1c is negative for diabetes We will evaluate forward B12 deficiency - HgB A1c - Vitamin B12 - CBC with Differential/Platelet - Basic Metabolic Panel  2. Urinary symptom or sign Urine revealed trace leuks Increase fluid intake, will send of urine culture - POCT URINALYSIS DIP (CLINITEK) - Urine Culture  3. History of recurrent vaginal discharge I have educated her about physiologic vaginal discharge and she happens to be in the window where this would occur We will proceed with evaluating for underlying infection - Cervicovaginal ancillary  only      Charlott Rakes, MD, FAAFP. [facility] and [facility], [place]   11/05/2019, 11:08 AM

## 2019-11-05 NOTE — Patient Instructions (Signed)
Vaginitis Vaginitis  La vaginitis es la irritacin e hinchazn (inflamacin) de la vagina. Ocurre cuando las bacterias y levaduras que se encuentran normalmente en la vagina crecen demasiado. Hay muchos tipos de esta afeccin. El tratamiento depende del tipo que usted tenga. Siga estas indicaciones en su casa: Estilo de vida  Mantenga el rea vaginal limpia y seca. ? Evite usar jabn. ? Enjuague la zona con agua.  No haga las siguientes cosas hasta que el mdico lo autorice: ? Lavar y limpiar dentro de la vagina (ducha vaginal). ? Usar tampones. ? Tener relaciones sexuales.  Cuando vaya al bao, lmpiese de adelante hacia atrs.  Deje que la vagina respire. ? Use ropa interior de algodn. ? No use:  Ropa interior mientras duerme.  Pantalones ajustados.  Ropa interior tipo tanga.  Ropa interior o medias de nailon sin proteccin de algodn. ? Qutese la ropa hmeda, como los trajes de bao, lo antes posible.  Use productos suaves y sin perfume. No use cosas que puedan irritar la vagina, como los suavizantes para telas. Evite los siguientes productos si son perfumados: ? Aerosoles ntimos femeninos. ? Detergentes. ? Tampones. ? Productos de higiene femenina. ? Jabones o baos de espuma.  Practique sexo seguro y use preservativos. Instrucciones generales  Tome los medicamentos de venta libre y los recetados solamente como se lo haya indicado el mdico.  Si le recetaron un antibitico, tmelo o selo como se lo haya indicado el mdico. No deje de tomar ni de usar los antibiticos aunque comience a sentirse mejor.  Concurra a todas las visitas de control como se lo haya indicado el mdico. Esto es importante. Comunquese con un mdico si:  Tiene dolor de vientre.  Tiene fiebre.  Sus sntomas duran ms de 2 o 3das. Solicite ayuda de inmediato si:  Tiene fiebre y los sntomas empeoran de manera sbita. Resumen  La vaginitis es la irritacin e hinchazn de la  vagina. Puede ocurrir cuando las bacterias y levaduras que se encuentran normalmente en la vagina crecen demasiado. Hay varios tipos.  El tratamiento depende del tipo que usted tenga.  No se haga duchas vaginales, no use tampones ni tenga relaciones sexuales hasta que el mdico la autorice. Cuando pueda volver a tener relaciones sexuales, practique sexo seguro y use condones. Esta informacin no tiene como fin reemplazar el consejo del mdico. Asegrese de hacerle al mdico cualquier pregunta que tenga. Document Revised: 06/15/2017 Document Reviewed: 03/01/2012 Elsevier Patient Education  2020 Elsevier Inc.  

## 2019-11-06 LAB — CERVICOVAGINAL ANCILLARY ONLY
Bacterial Vaginitis (gardnerella): NEGATIVE
Candida Glabrata: NEGATIVE
Candida Vaginitis: NEGATIVE
Chlamydia: NEGATIVE
Comment: NEGATIVE
Comment: NEGATIVE
Comment: NEGATIVE
Comment: NEGATIVE
Comment: NEGATIVE
Comment: NORMAL
Neisseria Gonorrhea: NEGATIVE
Trichomonas: NEGATIVE

## 2019-11-07 ENCOUNTER — Telehealth: Payer: Self-pay

## 2019-11-07 LAB — URINE CULTURE

## 2019-11-07 NOTE — Telephone Encounter (Signed)
-----   Message from [name], MD sent at 11/07/2019 10:42 AM EST ----- Urine culture is negative for infection.

## 2019-11-07 NOTE — Telephone Encounter (Signed)
Patient was called and phone number on file is not in service.  Voicemail was left on [facility] phone number.

## 2019-11-07 NOTE — Telephone Encounter (Signed)
-----   Message from Enobong Newlin, MD sent at 11/06/2019  1:37 PM EST ----- Please inform the patient that labs are normal. Thank you. 

## 2019-11-14 ENCOUNTER — Telehealth: Payer: Self-pay

## 2019-11-14 NOTE — Telephone Encounter (Signed)
-----   Message from Enobong Newlin, MD sent at 11/06/2019  1:37 PM EST ----- Please inform the patient that labs are normal. Thank you. 

## 2019-11-14 NOTE — Telephone Encounter (Signed)
Patient name and DOB has been verified Patient was informed of lab results. Patient had no questions.  

## 2019-11-14 NOTE — Telephone Encounter (Signed)
-----   Message from Enobong Newlin, MD sent at 11/07/2019 10:42 AM EST ----- Urine culture is negative for infection. 

## 2019-12-24 MED FILL — OMEPRAZOLE DR 40 MG CAPSULE: 40 | 30 days supply | Qty: 60 | Fill #3

## 2020-01-01 ENCOUNTER — Other Ambulatory Visit: Payer: Self-pay

## 2020-01-01 ENCOUNTER — Ambulatory Visit: Payer: Self-pay | Attending: Family Medicine

## 2020-01-08 ENCOUNTER — Other Ambulatory Visit: Payer: Self-pay

## 2020-01-08 ENCOUNTER — Ambulatory Visit: Payer: Self-pay | Admitting: Physician Assistant

## 2020-01-08 DIAGNOSIS — M545 Low back pain, unspecified: Secondary | ICD-10-CM

## 2020-01-08 DIAGNOSIS — M62838 Other muscle spasm: Secondary | ICD-10-CM

## 2020-01-08 DIAGNOSIS — Z789 Other specified health status: Secondary | ICD-10-CM

## 2020-01-08 DIAGNOSIS — T7840XD Allergy, unspecified, subsequent encounter: Secondary | ICD-10-CM

## 2020-01-08 MED ORDER — METHOCARBAMOL 500 MG PO TABS: 500.0000 mg | ORAL_TABLET | Freq: Four times a day (QID) | ORAL | 0 refills | Status: DC

## 2020-01-08 MED ORDER — FLUTICASONE PROPIONATE 50 MCG/ACT NA SUSP: 2.0000 | Freq: Every day | NASAL | 6 refills | Status: DC

## 2020-01-08 MED ORDER — CETIRIZINE HCL 10 MG PO TABS: 10.0000 mg | ORAL_TABLET | Freq: Every day | ORAL | 11 refills | Status: DC

## 2020-01-08 MED FILL — METHOCARBAMOL 500 MG TABS: 500 | 22 days supply | Qty: 90 | Fill #0

## 2020-01-08 MED FILL — ?CETIRIZINE HCL 10 MG TABLE: 10 | 30 days supply | Qty: 30 | Fill #0

## 2020-01-08 MED FILL — FLUTICASONE PROP 50 MCG SPR: 50 | 30 days supply | Qty: 16 | Fill #0

## 2020-01-08 NOTE — Progress Notes (Signed)
Virtual Visit via Telephone Note  I connected with [name] on 01/08/20 at 11:10 AM EDT by telephone and verified that I am speaking with the correct person using two identifiers.   I discussed the limitations, risks, security and privacy concerns of performing an evaluation and management service by telephone and the availability of in person appointments. I also discussed with the patient that there may be a patient responsible charge related to this service. The patient expressed understanding and agreed to proceed.  PATIENT visit by telephone virtually in the context of Covid-19 pandemic. Patient location:  home My Location:  [facility] office Persons on the call:  Me, the patient, and Judeth Cornfield interpreter  History of Present Illness:  Back pain coming and going for about 2 weeks.  Ranks the pain 7 out of 10.  Tylenol not helping.  NKI.  No lifting. No UTI s/sx.  No fever.  Pain is in lower back.  Says she can't take NSAIDS due to "gastritis."    Also having some sneezing, itchy eyes and congestion since spring started. No cough.  OTC not helping    Observations/Objective:  NAD.  A&Ox3   Assessment and Plan: 1. Acute low back pain without sciatica, unspecified back pain laterality Continue tylenol - methocarbamol (ROBAXIN) 500 MG tablet; Take 1 tablet (500 mg total) by mouth 4 (four) times daily. X 7days then prn  Dispense: 90 tablet; Refill: 0  2. Language barrier stratus interpreters used and additional time performing visit was required.   3. Muscle spasm - methocarbamol (ROBAXIN) 500 MG tablet; Take 1 tablet (500 mg total) by mouth 4 (four) times daily. X 7days then prn  Dispense: 90 tablet; Refill: 0  4. Allergy, subsequent encounter - cetirizine (ZYRTEC) 10 MG tablet; Take 1 tablet (10 mg total) by mouth daily.  Dispense: 30 tablet; Refill: 11 - fluticasone (FLONASE) 50 MCG/ACT nasal spray; Place 2 sprays into both nostrils daily.  Dispense: 16 g; Refill:  6    Follow Up Instructions: See PCP prn   I discussed the assessment and treatment plan with the patient. The patient was provided an opportunity to ask questions and all were answered. The patient agreed with the plan and demonstrated an understanding of the instructions.   The patient was advised to call back or seek an in-person evaluation if the symptoms worsen or if the condition fails to improve as anticipated.  I provided 12 minutes of non-face-to-face time during this encounter.   Georgian Co, PA-C  Patient ID: [name], female   DOB: 07/30/1986, 34 y.o.   MRN: [id number]

## 2020-02-17 ENCOUNTER — Encounter: Payer: Self-pay | Admitting: Family

## 2020-02-17 ENCOUNTER — Ambulatory Visit: Payer: Self-pay | Attending: Family | Admitting: Family

## 2020-02-17 ENCOUNTER — Other Ambulatory Visit: Payer: Self-pay

## 2020-02-17 VITALS — BP 111/71 | HR 88 | Temp 97.9°F | Resp 16 | Wt 126.2 lb

## 2020-02-17 DIAGNOSIS — R61 Generalized hyperhidrosis: Secondary | ICD-10-CM

## 2020-02-17 DIAGNOSIS — Z8742 Personal history of other diseases of the female genital tract: Secondary | ICD-10-CM

## 2020-02-17 DIAGNOSIS — Z789 Other specified health status: Secondary | ICD-10-CM

## 2020-02-17 LAB — POCT URINALYSIS DIP (CLINITEK)
Bilirubin, UA: NEGATIVE
Glucose, UA: NEGATIVE mg/dL
Ketones, POC UA: NEGATIVE mg/dL
Leukocytes, UA: NEGATIVE
Nitrite, UA: NEGATIVE
POC PROTEIN,UA: NEGATIVE
Spec Grav, UA: 1.025 (ref 1.010–1.025)
Urobilinogen, UA: 0.2 E.U./dL
pH, UA: 6 (ref 5.0–8.0)

## 2020-02-17 NOTE — Progress Notes (Signed)
Patient ID: [name], female    DOB: 1986-03-17  MRN: [id number]  CC: Sweating too much  Subjective: [name] is a 34 y.o. female with history of syncope, eczema, underweight, bulimia nervosa, secondary amenorrhea, depression, starvation ketoacidosis, hypokalemia, protein-calorie malnutrition severe, prolonged QT intervla on ECG, atypical chest pain, and left arm pain who presents for sweating too much.  1. SWEATING: Onset: 1 month ago began sweating more than usual Location: underarms, private area, and entire body Duration: Sweating only happens with physical activity such as exercise. Occasionally happens at night. Reports sweat smells like very strong onions and its unbearable to smell. Reports if she wears a lot of clothes during hot weather she sweats more.  Currently using Dove deodorant and perfume to help with this. Reports she uses perfume after taking a shower. States she takes a shower after exercise and the onion smell goes away for the most part. Denies rash and itching.  2. VAGINAL DISCHARGE: Onset: reports she doen't know and reports this has been consistent for quite some time Description: yellow/thick Modifying factors: none  Symptoms Odor: denies Itching: yes Vaginal burning: yes Dysuria: yes Bleeding: denies  Pelvic pain: yes  Back pain: yes Fever: denies  Genital sores: denies Rash: denies  Dyspareunia: yes GI Symptoms: yes    Red Flags:  Missed period: denies Possible STD exposure: denies IUD: denies Diabetes: denies    Patient Active Problem List   Diagnosis Date Noted  . Eczema 07/10/2017  . Pregnancy 04/12/2014  . Atypical chest pain 05/15/2013  . Left arm pain 05/15/2013  . Starvation ketoacidosis 04/12/2013  . Syncope 04/12/2013  . Hypokalemia 04/12/2013  . Protein-calorie malnutrition, severe ([place]) 04/12/2013  . Prolonged Q-T interval on ECG 04/12/2013  . Depression 04/16/2012  . Underweight 03/20/2012  . Bulimia  nervosa 03/20/2012  . Amenorrhea, secondary 03/20/2012     Current Outpatient Medications on File Prior to Visit  Medication Sig Dispense Refill  . cetirizine (ZYRTEC) 10 MG tablet Take 1 tablet (10 mg total) by mouth daily. 30 tablet 11  . fluticasone (FLONASE) 50 MCG/ACT nasal spray Place 2 sprays into both nostrils daily. 16 g 6  . methocarbamol (ROBAXIN) 500 MG tablet Take 1 tablet (500 mg total) by mouth 4 (four) times daily. X 7days then prn 90 tablet 0  . triamcinolone cream (KENALOG) 0.1 % Apply 1 application topically 2 (two) times daily. (Patient not taking: Reported on 01/08/2020) 30 g 2   No current facility-administered medications on file prior to visit.    No Known Allergies  Social History   Socioeconomic History  . Marital status: Married    Spouse name: Not on file  . Number of children: Not on file  . Years of education: Not on file  . Highest education level: Not on file  Occupational History  . Not on file  Tobacco Use  . Smoking status: Never Smoker  . Smokeless tobacco: Never Used  Substance and Sexual Activity  . Alcohol use: No  . Drug use: No  . Sexual activity: Yes    Birth control/protection: Implant  Other Topics Concern  . Not on file  Social History Narrative   ** Merged History Encounter **       Lives in [place] since 2005 with husband and 2 children (2011, 2008). Moved from [place] in 2005. Spanish speaking.    Social Determinants of Health   Financial Resource Strain:   . Difficulty of Paying Living Expenses:  Food Insecurity:   . Worried About Programme researcher, broadcasting/film/video in the Last Year:   . Barista in the Last Year:   Transportation Needs:   . Freight forwarder (Medical):   Marland Kitchen Lack of Transportation (Non-Medical):   Physical Activity:   . Days of Exercise per Week:   . Minutes of Exercise per Session:   Stress:   . Feeling of Stress :   Social Connections:   . Frequency of Communication with Friends and Family:     . Frequency of Social Gatherings with Friends and Family:   . Attends Religious Services:   . Active Member of Clubs or Organizations:   . Attends Banker Meetings:   Marland Kitchen Marital Status:   Intimate Partner Violence:   . Fear of Current or Ex-Partner:   . Emotionally Abused:   Marland Kitchen Physically Abused:   . Sexually Abused:     Family History  Problem Relation Age of Onset  . Diabetes Father   . Hearing loss Neg Hx   . Cancer Neg Hx   . Heart failure Neg Hx     Past Surgical History:  Procedure Laterality Date  . COLONOSCOPY WITH PROPOFOL N/A 03/18/2016   Procedure: COLONOSCOPY WITH PROPOFOL;  Surgeon: Vida Rigger, MD;  Location: [facility];  Service: Endoscopy;  Laterality: N/A;  . ESOPHAGOGASTRODUODENOSCOPY (EGD) WITH PROPOFOL N/A 09/22/2017   Procedure: ESOPHAGOGASTRODUODENOSCOPY (EGD) WITH PROPOFOL;  Surgeon: Vida Rigger, MD;  Location: [facility] ENDOSCOPY;  Service: Endoscopy;  Laterality: N/A;  . NO PAST SURGERIES      ROS: Review of Systems Negative except as stated above  PHYSICAL EXAM: Vitals with BMI 02/17/2020 11/05/2019 08/09/2019  Height - 5\' 3"  -  Weight 126 lbs 3 oz 126 lbs 127 lbs  BMI - 22.33 -  Systolic 111 103  Diastolic 71 70 87  Pulse 88 88 84  SpO2- 98%, room air  Temperature- 97.9 F, oral  Physical Exam General appearance - alert, well appearing, and in no distress and oriented to person, place, and time Mental status - alert, oriented to person, place, and time, normal mood, behavior, speech, dress, motor activity, and thought processes Neck - supple, no significant adenopathy Lymphatics - no palpable lymphadenopathy, no hepatosplenomegaly Chest - clear to auscultation, no wheezes, rales or rhonchi, symmetric air entry, no tachypnea, retractions or cyanosis Heart - normal rate, regular rhythm, normal S1, S2, no murmurs, rubs, clicks or gallops  ASSESSMENT AND PLAN: 1. Sweating increase: -Use antiperspirants such as deodorants purchased  over-the-counter as needed. These are medicines that stop sweat. -Limit or avoid foods or beverages that may increase your risk of sweating, such as spicy food, caffeine, alcohol, and foods that contain monosodium glutamate (MSG) -Follow-up with primary physician as needed if new symptoms develop or current symptoms get worse.  2. History of recurrent vaginal discharge: -Will obtain a cervicovaginal ancillary today to test for possible sexually transmitted infections. -Will obtain a urinalysis today to test for possible urinary tract infection. - Cervicovaginal ancillary only - POCT URINALYSIS DIP (CLINITEK)  3. Language barrier: -[employer] participated during today's visit. Interpreter Name: [id number] #: Antony Odea   Patient was given the opportunity to ask questions.  Patient verbalized understanding of the plan and was able to repeat key elements of the plan. Patient was given clear instructions to go to Emergency Department or return to medical center if symptoms don't improve, worsen, or new problems develop.The patient verbalized understanding.   Nicanor Mendolia  Zachery Dauer, NP

## 2020-02-17 NOTE — Patient Instructions (Signed)
Use desodorante segn sea necesario para sudar. Anlisis de [place] y auxiliares cervicovaginales en la actualidad. Realice un seguimiento con el mdico de cabecera segn sea necesario.  Use deodorant as needed for sweating. Cervicovaginal ancillary and urinalysis today. Follow-up with primary physician as needed.  Vaginitis Vaginitis  La vaginitis es la irritacin e hinchazn (inflamacin) de la vagina. Ocurre cuando las bacterias y levaduras que se encuentran normalmente en la vagina crecen demasiado. Hay muchos tipos de Copy. El tratamiento depende del tipo que usted tenga. Siga estas indicaciones en su casa: Estilo de vida  Mantenga el rea vaginal limpia y seca. ? Evite usar jabn. ? Enjuague la zona con agua.  No haga las siguientes cosas hasta que el mdico lo autorice: ? Manufacturing engineer y limpiar dentro de la vagina (ducha vaginal). ? Usar tampones. ? Tener [employer] [place].  Cuando vaya al bao, lmpiese de adelante hacia atrs.  Deje que la vagina respire. ? Use ropa interior de algodn. ? No use:  Ropa interior mientras duerme.  Pantalones ajustados.  Ropa interior tipo tanga.  Ropa interior o medias de nailon sin proteccin de algodn. ? [employer] ropa hmeda, como los trajes de bao, lo antes posible.  Use productos suaves y sin perfume. No use cosas que puedan irritar la vagina, como los suavizantes para telas. Evite los siguientes productos si son perfumados: ? Aerosoles ntimos femeninos. ? Detergentes. ? Tampones. ? Productos de higiene femenina. ? Jabones o baos de espuma.  Practique sexo seguro y use preservativos. Instrucciones generales  [employer] de venta libre y los recetados solamente como se lo haya indicado el mdico.  Si le recetaron un antibitico, tmelo o selo como se lo haya indicado el mdico. No deje de tomar ni de usar los antibiticos aunque comience a Actor.  Concurra a todas las visitas de control como se lo  haya indicado el mdico. Esto es importante. Comunquese con un mdico si:  Tiene dolor de vientre.  Tiene fiebre.  Sus sntomas duran ms de 2 o 3das. Solicite ayuda de inmediato si:  Tiene fiebre y los sntomas empeoran de [place] sbita. Resumen  La vaginitis es la irritacin e hinchazn de la vagina. Puede ocurrir cuando las bacterias y levaduras que se encuentran normalmente en la vagina crecen demasiado. Hay varios tipos.  El tratamiento depende del tipo que usted tenga.  No se haga duchas vaginales, no use tampones ni tenga relaciones sexuales hasta que el mdico la autorice. Cuando pueda volver a Management consultant, practique sexo seguro y use condones. Esta informacin no tiene Theme park manager el consejo del mdico. Asegrese de hacerle al mdico cualquier pregunta que tenga. Document Revised: 06/15/2017 Document Reviewed: 03/01/2012 Elsevier Patient Education  2020 Elsevier Inc.   Hiperhidrosis Hyperhidrosis La hiperhidrosis es una afeccin que se caracteriza porque el cuerpo transpira mucho ms de lo normal (de forma excesiva). La transpiracin es una funcin necesaria para el cuerpo [place]. Es normal que [place] persona transpire cuando tiene Airline pilot, hace actividad fsica o est ansiosa. Sin embargo, la hiperhidrosis es la transpiracin a Tour manager. Aunque esta afeccin no es grave, puede hacer que una persona se sienta avergonzada. Hay dos tipos de hiperhidrosis:  Hiperhidrosis primaria. Por lo general, la transpiracin se localiza en una parte del cuerpo como las [place], o bien en algunas reas [employer], la cara, las axilas y [place]. Este es el tipo de hiperhidrosis ms comn.  Hiperhidrosis secundaria. Por lo general, este tipo afecta  todo el cuerpo. Cules son las causas? La causa de esta afeccin depende del tipo de hiperhidrosis que usted tenga.  La hiperhidrosis primaria puede deberse a que las glndulas sudorparas son ms activas de lo  normal.  La hiperhidrosis secundaria puede deberse a una afeccin subyacente o a recibir ciertos medicamentos, como antidepresivos o medicamentos para la diabetes. Las posibles afecciones que pueden causar hiperhidrosis secundaria incluyen: ? Diabetes. ? Gota. ? Ansiedad. ? Obesidad. ? Menopausia. ? Hiperactividad de la glndula tiroidea (hipertiroidismo). ? Tumores. ? Congelacin. ? Ciertos tipos de cncer. ? Alcoholismo. ? Lesiones en el sistema nervioso. ? Accidente cerebrovascular. ? La enfermedad de Parkinson. Qu incrementa el riesgo? Tiene ms probabilidades de desarrollar hiperhidrosis primaria si tiene antecedentes familiares de la afeccin. Cules son los signos o los sntomas? Los sntomas de esta afeccin [employer] siguientes:  Sensacin de transpirar permanentemente, incluso mientras no est [place].  Piel que se descama, se pone ms plida o ms fina en las zonas de mayor transpiracin.  Ser capaz de ver la transpiracin en la piel. Otros sntomas dependen del tipo de hiperhidrosis que usted tenga.  Los sntomas de hiperhidrosis primaria pueden incluir lo siguiente: ? Lutricia Horsfall por los mismos lugares a ambos lados del cuerpo. ? Advertising account planner solo [employer] y no mientras est durmiendo. ? Advertising account planner en reas especficas, como las [place], las palmas de [place], los pies y Dance movement psychotherapist.  Los sntomas de hiperhidrosis secundaria pueden incluir lo siguiente: ? Transpirar todo el cuerpo. ? Transpirar incluso mientras duerme. Cmo se diagnostica? Este trastorno puede diagnosticarse mediante:  Antecedentes mdicos.  Un examen fsico. Tambin pueden hacerle otras pruebas, incluidas las siguientes:  Estudios para medir la cantidad de transpiracin que usted produce y para Scientist, clinical (histocompatibility and immunogenetics) las reas en las que transpira ms. Estos estudios pueden implicar: ? Usar sustancias qumicas que cambian de color para mostrar los patrones de transpiracin en la piel. ? Papel de  pesar que se ha aplicado en la piel. Esto mostrar la cantidad de transpiracin que produce el cuerpo. ? Medir la cantidad de agua que se evapora de la piel. ? Usar tecnologa infrarroja para mostrar los patrones de transpiracin en la piel.  Realizar estudios para Warehouse manager afecciones que puedan estar causando el exceso de transpiracin. Estos pueden incluir anlisis de [place], [place] o estudios de diagnstico por imgenes. Cmo se trata? El tratamiento para esta afeccin depende del tipo de hiperhidrosis que usted tenga y de las reas del cuerpo afectadas. Su mdico tambin tratar cualquier otra afeccin subyacente. El tratamiento puede incluir lo siguiente:  Medicamentos, por ejemplo: ? Antitranspirantes. Estos son medicamentos que Education officer, environmental. ? Medicamentos inyectables. Estos pueden incluir pequeas inyecciones de toxina botulnica. ? Medicamentos por va oral. Se toman por boca para tratar afecciones subyacentes y otros sntomas.  Un procedimiento para: ? Clinical cytogeneticist las glndulas sudorparas de las manos y los pies (iontoforesis). ? Eliminar las glndulas sudorparas. ? Cortar o destruir los nervios de modo que no enven una seal a las glndulas sudorparas (simpatectoma). Siga estas indicaciones en su casa: Estilo de vida   Limite o evite los alimentos o las bebidas que puedan aumentar el riesgo de transpirar, [place] siguientes: ? Comidas condimentadas. ? Cafena. ? Alcohol. ? Alimentos que contengan glutamato monosdico (GMS).  Si le transpiran los pies: ? Use sandalias cuando sea posible. ? No use medias de algodn. Use medias que eliminen o absorban la humedad de [employer]. ? Use zapatos de cuero. ? No use el  mismo par de zapatos [place].  Trate de colocar almohadillas absorbentes de transpiracin debajo de la ropa para evitar que se vea la transpiracin.  Mantenga un registro de los sntomas de transpiracin y de cundo  ocurren. Esto puede ayudarlo a identificar los factores que desencadenan la transpiracin. Instrucciones generales  [employer] de venta libre y los recetados solamente como se lo haya indicado el mdico.  Use los antitranspirantes como se lo haya indicado su mdico.  Considere la posibilidad de unirse a un grupo de apoyo para la hiperhidrosis.  Concurra a todas las visitas de control como se lo haya indicado el mdico. Esto es importante. Comunquese con un mdico si:  Aparecen nuevos sntomas.  Los sntomas empeoran. Resumen  La hiperhidrosis es una afeccin que se caracteriza porque el cuerpo transpira mucho ms de lo normal (de forma excesiva).  En el caso de la hiperhidrosis primaria, la transpiracin generalmente se localiza en una parte del cuerpo, como las [place], o bien en algunas reas [employer], la cara, las axilas y [place]. Es causada por las glndulas sudorparas en el rea afectada.  En el caso de la hiperhidrosis secundaria, la transpiracin afecta a todo el cuerpo. Esto se debe a una afeccin subyacente.  El tratamiento para esta afeccin depende del tipo de hiperhidrosis que usted tenga y de las partes del cuerpo afectadas. Esta informacin no tiene Marine scientist el consejo del mdico. Asegrese de hacerle al mdico cualquier pregunta que tenga. Document Revised: 11/13/2017 Document Reviewed: 11/13/2017 Elsevier Patient Education  [place].

## 2020-02-18 LAB — CERVICOVAGINAL ANCILLARY ONLY
Bacterial Vaginitis (gardnerella): NEGATIVE
Candida Glabrata: NEGATIVE
Candida Vaginitis: NEGATIVE
Chlamydia: NEGATIVE
Comment: NEGATIVE
Comment: NEGATIVE
Comment: NEGATIVE
Comment: NEGATIVE
Comment: NEGATIVE
Comment: NORMAL
Neisseria Gonorrhea: NEGATIVE
Trichomonas: NEGATIVE

## 2020-02-18 NOTE — Progress Notes (Signed)
Please call patient with update.   Negative for all sexually transmitted infections.  Urine has minimal amount of blood. No further treatment needed at this time.   Follow-up with primary physician as needed.

## 2020-02-19 ENCOUNTER — Ambulatory Visit: Payer: Self-pay

## 2020-03-25 ENCOUNTER — Encounter: Payer: Self-pay | Admitting: Family Medicine

## 2020-03-25 ENCOUNTER — Other Ambulatory Visit: Payer: Self-pay

## 2020-03-25 ENCOUNTER — Ambulatory Visit: Payer: Self-pay | Attending: Family Medicine | Admitting: Family Medicine

## 2020-03-25 DIAGNOSIS — N898 Other specified noninflammatory disorders of vagina: Secondary | ICD-10-CM

## 2020-03-25 DIAGNOSIS — R311 Benign essential microscopic hematuria: Secondary | ICD-10-CM

## 2020-03-25 DIAGNOSIS — K648 Other hemorrhoids: Secondary | ICD-10-CM

## 2020-03-25 MED ORDER — HYDROCORT-PRAMOXINE (PERIANAL) 2.5-1 % EX CREA: 1.0000 "application " | TOPICAL_CREAM | Freq: Three times a day (TID) | CUTANEOUS | 1 refills | Status: DC

## 2020-03-25 MED FILL — HYDROCORT-PRAMOXINE 2.5-1%: 2.5-1 | 7 days supply | Qty: 30 | Fill #0

## 2020-03-25 NOTE — Progress Notes (Signed)
Virtual Visit via Telephone Note  I connected with [name], on 03/25/2020 at 10:36 AM by telephone due to the COVID-19 pandemic and verified that I am speaking with the correct person using two identifiers.   Consent: I discussed the limitations, risks, security and privacy concerns of performing an evaluation and management service by telephone and the availability of in person appointments. I also discussed with the patient that there may be a patient responsible charge related to this service. The patient expressed understanding and agreed to proceed.    Location of Patient: Home  Location of Provider: Clinic   Persons participating in Telemedicine visit: Judit Delapaz Acasia Skilton ID# [id number] - interpreter Dr. Margarita Rana     History of Present Illness: 34 year old female who presents today with the following concerns.   She informs me she was advised to have a repeat urinalysis due to presence of trace RBC which she attributes to having used a vaginal swab for culture prior to obtaining urine specimen. She denies dysuria but endorses frequency and intermittent suprapubic pain. She also endorses presence of vaginal discharge , completed her last cycle 12 days ago. Last cervicovaginal swab result was negative. [facility] and as per patient result was normal A1c in 11/2019 was 5.2. She was seen by GI and informed she had hemorrhoids; requesting medication for this. Past Medical History:  Diagnosis Date  . Bulimia   . Bulimia nervosa   . Eating disorder   . GERD (gastroesophageal reflux disease)    No Known Allergies  Current Outpatient Medications on File Prior to Visit  Medication Sig Dispense Refill  . cetirizine (ZYRTEC) 10 MG tablet Take 1 tablet (10 mg total) by mouth daily. (Patient not taking: Reported on 03/25/2020) 30 tablet 11  . fluticasone (FLONASE) 50 MCG/ACT nasal spray Place 2 sprays into  both nostrils daily. (Patient not taking: Reported on 03/25/2020) 16 g 6  . methocarbamol (ROBAXIN) 500 MG tablet Take 1 tablet (500 mg total) by mouth 4 (four) times daily. X 7days then prn (Patient not taking: Reported on 03/25/2020) 90 tablet 0  . triamcinolone cream (KENALOG) 0.1 % Apply 1 application topically 2 (two) times daily. (Patient not taking: Reported on 01/08/2020) 30 g 2   No current facility-administered medications on file prior to visit.    Observations/Objective: Alert, awake, oriented x3 Not in acute distress  Assessment and Plan: 1. Benign essential microscopic hematuria Likely due to trauma from a cervical vaginal swab No indication to repeat urinalysis at this time  2. Vaginal discharge Physiologic discharge given she is likely ovulating  3. Other hemorrhoids Counseled on avoiding constipation Sitz bath will be beneficial - hydrocortisone-pramoxine (ANALPRAM HC) 2.5-1 % rectal cream; Place 1 application rectally 3 (three) times daily.  Dispense: 30 g; Refill: 1   Follow Up Instructions: Keep previously scheduled appointment   I discussed the assessment and treatment plan with the patient. The patient was provided an opportunity to ask questions and all were answered. The patient agreed with the plan and demonstrated an understanding of the instructions.   The patient was advised to call back or seek an in-person evaluation if the symptoms worsen or if the condition fails to improve as anticipated.     I provided 14 minutes total of non-face-to-face time during this encounter including median intraservice time, reviewing previous notes, investigations, ordering medications, medical decision making, coordinating care and patient verbalized understanding at  the end of the visit.     [name], MD, FAAFP. [facility] [place], [place] [phone]   03/25/2020, 10:36 AM

## 2020-04-29 ENCOUNTER — Other Ambulatory Visit: Payer: Self-pay | Admitting: Physician Assistant

## 2020-04-30 MED FILL — HYDROCORTISONE 2.5% CREAM: 2.5 | 30 days supply | Qty: 30 | Fill #0

## 2020-04-30 MED FILL — LACTULOSE 10 GM/15 ML SOLN: 10 | 30 days supply | Qty: 473 | Fill #0

## 2020-04-30 MED FILL — DICYCLOMINE 10 MG CAPSULE: 10 | 30 days supply | Qty: 90 | Fill #0

## 2020-05-19 MED FILL — OMEPRAZOLE DR 40 MG CAPSULE: 40 | 30 days supply | Qty: 60 | Fill #5

## 2020-05-19 MED FILL — DICYCLOMINE 10 MG CAPSULE: 10 | 30 days supply | Qty: 90 | Fill #0

## 2020-06-11 ENCOUNTER — Other Ambulatory Visit: Payer: Self-pay | Admitting: Gastroenterology

## 2020-06-11 MED FILL — PANTOPRAZOLE SOD DR 40 MG T: 40 | 30 days supply | Qty: 60 | Fill #0

## 2020-07-16 ENCOUNTER — Other Ambulatory Visit: Payer: Self-pay | Admitting: Gastroenterology

## 2020-07-16 MED FILL — !LINZESS 290 MCG CAPSULE: 290 | 30 days supply | Qty: 30 | Fill #0

## 2020-07-21 ENCOUNTER — Other Ambulatory Visit: Payer: Self-pay

## 2020-07-21 ENCOUNTER — Ambulatory Visit: Payer: Self-pay | Attending: Family Medicine

## 2020-07-24 ENCOUNTER — Telehealth: Payer: Self-pay | Admitting: Family Medicine

## 2020-07-24 ENCOUNTER — Ambulatory Visit (HOSPITAL_COMMUNITY)
Admission: EM | Admit: 2020-07-24 | Discharge: 2020-07-24 | Disposition: A | Discharge status: Home / Self Care | Payer: Self-pay | Attending: Family Medicine | Admitting: Family Medicine

## 2020-07-24 ENCOUNTER — Encounter (HOSPITAL_COMMUNITY): Payer: Self-pay | Admitting: Emergency Medicine

## 2020-07-24 ENCOUNTER — Other Ambulatory Visit: Payer: Self-pay

## 2020-07-24 DIAGNOSIS — J029 Acute pharyngitis, unspecified: Secondary | ICD-10-CM | POA: Insufficient documentation

## 2020-07-24 LAB — POCT RAPID STREP A, ED / UC: Streptococcus, Group A Screen (Direct): NEGATIVE

## 2020-07-24 MED ORDER — LIDOCAINE VISCOUS HCL 2 % MT SOLN
10.0000 mL | OROMUCOSAL | 0 refills | Status: DC | PRN
Start: 2020-07-24 — End: 2021-01-27

## 2020-07-24 NOTE — Telephone Encounter (Signed)
Called pt. No answer. Pt needs to see UC.    Copied from CRM [id number]. Topic: General - Other >> Jul 24, 2020  2:01 PM [name] wrote: Reason for CRM: Patient called in to say that she have some painful white spots in the side of her mouth and would like to be seen today please Ph# [phone]

## 2020-07-24 NOTE — ED Triage Notes (Signed)
Pt presents with sore throat and white spots xs 2 days. Denies fever. Denies COVID test at this time.

## 2020-07-24 NOTE — ED Provider Notes (Signed)
[facility]    CSN: [id number] Arrival date & time: 07/24/20  1522      History   Chief Complaint Chief Complaint  Patient presents with  . Sore Throat    HPI [name] is a 34 y.o. female.   Here today for evaluation of 2 day history of sore, swollen throat with observed white spots on it. A medical interpreter was used today for this visit with patient's consent. She states she has no other sxs at this time, no fever, chills, congestion, cough, N/V/D. Not taking anything OTC as she states she is pregnant and wasn't sure what to take. No known sick contacts.      Past Medical History:  Diagnosis Date  . Bulimia   . Bulimia nervosa   . Eating disorder   . GERD (gastroesophageal reflux disease)     Patient Active Problem List   Diagnosis Date Noted  . Eczema 07/10/2017  . Pregnancy 04/12/2014  . Atypical chest pain 05/15/2013  . Left arm pain 05/15/2013  . Starvation ketoacidosis 04/12/2013  . Syncope 04/12/2013  . Hypokalemia 04/12/2013  . Protein-calorie malnutrition, severe (HCC) 04/12/2013  . Prolonged Q-T interval on ECG 04/12/2013  . Depression 04/16/2012  . Underweight 03/20/2012  . Bulimia nervosa 03/20/2012  . Amenorrhea, secondary 03/20/2012    Past Surgical History:  Procedure Laterality Date  . COLONOSCOPY WITH PROPOFOL N/A 03/18/2016   Procedure: COLONOSCOPY WITH PROPOFOL;  Surgeon: Vida Rigger, MD;  Location: [facility] ENDOSCOPY;  Service: Endoscopy;  Laterality: N/A;  . ESOPHAGOGASTRODUODENOSCOPY (EGD) WITH PROPOFOL N/A 09/22/2017   Procedure: ESOPHAGOGASTRODUODENOSCOPY (EGD) WITH PROPOFOL;  Surgeon: Vida Rigger, MD;  Location: [facility] ENDOSCOPY;  Service: Endoscopy;  Laterality: N/A;  . NO PAST SURGERIES      OB History    Gravida  4   Para  3   Term  3   Preterm  0   AB  0   Living  3     SAB  0   TAB  0   Ectopic  0   Multiple      Live Births  3            Home Medications    Prior to Admission  medications   Medication Sig Start Date End Date Taking? Authorizing Provider  cetirizine (ZYRTEC) 10 MG tablet Take 1 tablet (10 mg total) by mouth daily. Patient not taking: Reported on 03/25/2020 01/08/20   Anders Simmonds, PA-C  fluticasone [facility]) 50 MCG/ACT nasal spray Place 2 sprays into both nostrils daily. Patient not taking: Reported on 03/25/2020 01/08/20   Anders Simmonds, PA-C  hydrocortisone-pramoxine [facility]) 2.5-1 % rectal cream Place 1 application rectally 3 (three) times daily. 03/25/20   [name], MD  lidocaine (XYLOCAINE) 2 % solution Use as directed 10 mLs in the mouth or throat as needed for mouth pain. 07/24/20   Particia Nearing, PA-C  methocarbamol (ROBAXIN) 500 MG tablet Take 1 tablet (500 mg total) by mouth 4 (four) times daily. X 7days then prn Patient not taking: Reported on 03/25/2020 01/08/20   Anders Simmonds, PA-C  triamcinolone cream (KENALOG) 0.1 % Apply 1 application topically 2 (two) times daily. Patient not taking: Reported on 01/08/2020 05/13/19   [name], MD    Family History Family History  Problem Relation Age of Onset  . Diabetes Father   . Hearing loss Neg Hx   . Cancer Neg Hx   . Heart failure Neg Hx  Social History Social History   Tobacco Use  . Smoking status: Never Smoker  . Smokeless tobacco: Never Used  Vaping Use  . Vaping Use: Never used  Substance Use Topics  . Alcohol use: No  . Drug use: No     Allergies   Patient has no known allergies.   Review of Systems Review of Systems PER HPI    Physical Exam Triage Vital Signs ED Triage Vitals  Enc Vitals Group     BP 07/24/20 1630 115/72     Pulse Rate 07/24/20 1630 94     Resp 07/24/20 1630 16     Temp 07/24/20 1630 98.9 F (37.2 C)     Temp Source 07/24/20 1630 Oral     SpO2 07/24/20 1630 100 %     Weight --      Height --      Head Circumference --      Peak Flow --      Pain Score 07/24/20 1628 2     Pain Loc --      Pain Edu?  --      Excl. in GC? --    No data found.  Updated Vital Signs BP 115/72 (BP Location: Left Arm)   Pulse 94   Temp 98.9 F (37.2 C) (Oral)   Resp 16   SpO2 100%   Visual Acuity Right Eye Distance:   Left Eye Distance:   Bilateral Distance:    Right Eye Near:   Left Eye Near:    Bilateral Near:     Physical Exam Vitals and nursing note reviewed.  Constitutional:      Appearance: Normal appearance. She is not ill-appearing.  HENT:     Head: Atraumatic.     Right Ear: Tympanic membrane normal.     Left Ear: Tympanic membrane normal.     Nose: Nose normal.     Mouth/Throat:     Mouth: Mucous membranes are moist.     Pharynx: Posterior oropharyngeal erythema (exudates present on left tonsil) present.  Eyes:     Extraocular Movements: Extraocular movements intact.     Conjunctiva/sclera: Conjunctivae normal.  Cardiovascular:     Rate and Rhythm: Normal rate and regular rhythm.     Heart sounds: Normal heart sounds.  Pulmonary:     Effort: Pulmonary effort is normal.     Breath sounds: Normal breath sounds. No wheezing.  Abdominal:     General: Bowel sounds are normal. There is no distension.     Palpations: Abdomen is soft.     Tenderness: There is no abdominal tenderness. There is no guarding.  Musculoskeletal:        General: Normal range of motion.     Cervical back: Normal range of motion and neck supple.  Skin:    General: Skin is warm and dry.  Neurological:     Mental Status: She is alert and oriented to person, place, and time.  Psychiatric:        Mood and Affect: Mood normal.        Thought Content: Thought content normal.        Judgment: Judgment normal.      UC Treatments / Results  Labs (all labs ordered are listed, but only abnormal results are displayed) Labs Reviewed  CULTURE, GROUP A STREP [facility])  POCT RAPID STREP A, ED / UC    EKG   Radiology No results found.  Procedures Procedures (including critical care time)  Medications  Ordered in UC Medications - No data to display  Initial Impression / Assessment and Plan / UC Course  I have reviewed the triage vital signs and the nursing notes.  Pertinent labs & imaging results that were available during my care of the patient were reviewed by me and considered in my medical decision making (see chart for details).     Exam, vitals reassuring. Rapid strep neg, throat culture pending. Will rx viscous lidocaine for comfort and discussed hot teas, salt water gargles. Return precautions given.  Final Clinical Impressions(s) / UC Diagnoses   Final diagnoses:  Pharyngitis, unspecified etiology   Discharge Instructions   None    ED Prescriptions    Medication Sig Dispense Auth. Provider   lidocaine (XYLOCAINE) 2 % solution Use as directed 10 mLs in the mouth or throat as needed for mouth pain. 100 mL Particia Nearing, [place]     PDMP not reviewed this encounter.   Particia Nearing, [place] 07/24/20 1912

## 2020-07-25 ENCOUNTER — Encounter (HOSPITAL_COMMUNITY): Payer: Self-pay

## 2020-07-25 ENCOUNTER — Inpatient Hospital Stay (HOSPITAL_COMMUNITY)
Admission: AD | Admit: 2020-07-25 | Discharge: 2020-07-25 | Disposition: A | Discharge status: Home / Self Care | Payer: Self-pay | Attending: Obstetrics & Gynecology | Admitting: Obstetrics & Gynecology

## 2020-07-25 ENCOUNTER — Inpatient Hospital Stay (HOSPITAL_COMMUNITY): Payer: Self-pay

## 2020-07-25 DIAGNOSIS — O209 Hemorrhage in early pregnancy, unspecified: Secondary | ICD-10-CM | POA: Insufficient documentation

## 2020-07-25 DIAGNOSIS — M549 Dorsalgia, unspecified: Secondary | ICD-10-CM | POA: Insufficient documentation

## 2020-07-25 DIAGNOSIS — O09521 Supervision of elderly multigravida, first trimester: Secondary | ICD-10-CM | POA: Insufficient documentation

## 2020-07-25 DIAGNOSIS — Z3A01 Less than 8 weeks gestation of pregnancy: Secondary | ICD-10-CM | POA: Insufficient documentation

## 2020-07-25 DIAGNOSIS — R109 Unspecified abdominal pain: Secondary | ICD-10-CM | POA: Insufficient documentation

## 2020-07-25 DIAGNOSIS — O99891 Other specified diseases and conditions complicating pregnancy: Secondary | ICD-10-CM | POA: Insufficient documentation

## 2020-07-25 DIAGNOSIS — Z20822 Contact with and (suspected) exposure to covid-19: Secondary | ICD-10-CM | POA: Insufficient documentation

## 2020-07-25 DIAGNOSIS — O26851 Spotting complicating pregnancy, first trimester: Secondary | ICD-10-CM

## 2020-07-25 DIAGNOSIS — O26891 Other specified pregnancy related conditions, first trimester: Secondary | ICD-10-CM | POA: Diagnosis present

## 2020-07-25 LAB — WET PREP, GENITAL
Clue Cells Wet Prep HPF POC: NONE SEEN
Sperm: NONE SEEN
Trich, Wet Prep: NONE SEEN
Yeast Wet Prep HPF POC: NONE SEEN

## 2020-07-25 LAB — ABO/RH: ABO/RH(D): O POS

## 2020-07-25 LAB — CBC
HCT: 41.2 % (ref 36.0–46.0)
Hemoglobin: 13.7 g/dL (ref 12.0–15.0)
MCH: 29.3 pg (ref 26.0–34.0)
MCHC: 33.3 g/dL (ref 30.0–36.0)
MCV: 88.2 fL (ref 80.0–100.0)
Platelets: 357 10*3/uL (ref 150–400)
RBC: 4.67 MIL/uL (ref 3.87–5.11)
RDW: 12.8 % (ref 11.5–15.5)
WBC: 6.4 10*3/uL (ref 4.0–10.5)
nRBC: 0 % (ref 0.0–0.2)

## 2020-07-25 LAB — URINALYSIS, ROUTINE W REFLEX MICROSCOPIC
Bilirubin Urine: NEGATIVE
Glucose, UA: NEGATIVE mg/dL
Hgb urine dipstick: NEGATIVE
Ketones, ur: NEGATIVE mg/dL
Leukocytes,Ua: NEGATIVE
Nitrite: NEGATIVE
Protein, ur: NEGATIVE mg/dL
Specific Gravity, Urine: 1.014 (ref 1.005–1.030)
pH: 5 (ref 5.0–8.0)

## 2020-07-25 LAB — POCT PREGNANCY, URINE: Preg Test, Ur: POSITIVE — AB

## 2020-07-25 LAB — RESPIRATORY PANEL BY RT PCR (FLU A&B, COVID)
Influenza A by PCR: NEGATIVE
Influenza B by PCR: NEGATIVE
SARS Coronavirus 2 by RT PCR: NEGATIVE

## 2020-07-25 LAB — HIV ANTIBODY (ROUTINE TESTING W REFLEX): HIV Screen 4th Generation wRfx: NONREACTIVE

## 2020-07-25 LAB — HCG, QUANTITATIVE, PREGNANCY: hCG, Beta Chain, Quant, S: 5415 m[IU]/mL — ABNORMAL HIGH (ref <5)

## 2020-07-25 NOTE — MAU Provider Note (Signed)
History     CSN: [id number]  Arrival date and time: 07/25/20 2703   First Provider Initiated Contact with Patient 07/25/20 1110 - In-person Spanish Interpreter Okey Regal present with assessment, history taking and explanation of treatment plan/orders       Chief Complaint  Patient presents with  . Abdominal Pain  . Back Pain  . Vaginal Bleeding   [name] is a 34 y.o. year old [id number] female at [redacted]w[redacted]d weeks gestation by LMP who presents to MAU reporting lower abdominal and back pain since yesterday. She reports she is also having light brown vaginal discharge that is now light pink. She denies any recent SI. She was seen yesterday at [facility] for sore swollen throat with observed white spots. Per the provider's documentation the patient's LT tonsil to have erythema and exudate. The patient states she was not Rx'd medication and that she was told "it would just go away."   OB History    Gravida  4   Para  3   Term  3   Preterm  0   AB  0   Living  3     SAB  0   TAB  0   Ectopic  0   Multiple      Live Births  3           Past Medical History:  Diagnosis Date  . Bulimia   . Bulimia nervosa   . Eating disorder   . GERD (gastroesophageal reflux disease)     Past Surgical History:  Procedure Laterality Date  . COLONOSCOPY WITH PROPOFOL N/A 03/18/2016   Procedure: COLONOSCOPY WITH PROPOFOL;  Surgeon: Vida Rigger, MD;  Location: [facility];  Service: Endoscopy;  Laterality: N/A;  . ESOPHAGOGASTRODUODENOSCOPY (EGD) WITH PROPOFOL N/A 09/22/2017   Procedure: ESOPHAGOGASTRODUODENOSCOPY (EGD) WITH PROPOFOL;  Surgeon: Vida Rigger, MD;  Location: [facility] ENDOSCOPY;  Service: Endoscopy;  Laterality: N/A;  . NO PAST SURGERIES      Family History  Problem Relation Age of Onset  . Diabetes Father   . Hearing loss Neg Hx   . Cancer Neg Hx   . Heart failure Neg Hx     Social History   Tobacco Use  . Smoking status: Never Smoker  . Smokeless  tobacco: Never Used  Vaping Use  . Vaping Use: Never used  Substance Use Topics  . Alcohol use: No  . Drug use: No    Allergies: No Known Allergies  No medications prior to admission.    Review of Systems  Constitutional: Negative.   HENT: Negative.   Eyes: Negative.   Respiratory: Negative.   Cardiovascular: Negative.   Gastrointestinal: Negative.   Endocrine: Negative.   Genitourinary: Positive for pelvic pain.  Musculoskeletal: Positive for back pain.  Skin: Negative.   Allergic/Immunologic: Negative.   Neurological: Negative.   Hematological: Negative.   Psychiatric/Behavioral: Negative.    Physical Exam   Blood pressure 124/75, pulse 93, temperature 98.4 F (36.9 C), temperature source Oral, resp. rate 16, last menstrual period 06/01/2020, SpO2 100 %.  Physical Exam Vitals and nursing note reviewed. Exam conducted with a chaperone present.  Constitutional:      Appearance: She is well-developed and normal weight.  HENT:     Head: Normocephalic and atraumatic.  Eyes:     Extraocular Movements: Extraocular movements intact.  Cardiovascular:     Rate and Rhythm: Normal rate.     Pulses: Normal pulses.     Heart sounds:  Normal heart sounds.  Pulmonary:     Effort: Pulmonary effort is normal.  Abdominal:     General: Abdomen is flat. Bowel sounds are normal.     Palpations: Abdomen is soft.  Genitourinary:    General: Normal vulva.     Comments: Uterus: non-tender, SE: cervix is smooth, pink, no lesions, small amt of thick, white vaginal d/c, no evidence of blood in vagina -- WP, GC/CT done, closed/long/firm, no CMT or friability, no adnexal tenderness  Musculoskeletal:        General: Normal range of motion.     Cervical back: Normal range of motion.  Skin:    General: Skin is warm and dry.  Neurological:     Mental Status: She is alert and oriented to person, place, and time.  Psychiatric:        Mood and Affect: Mood normal.        Behavior: Behavior  normal.        Thought Content: Thought content normal.        Judgment: Judgment normal.    MAU Course  Procedures  MDM CCUA UPT CBC ABO/Rh -- known not drawn HCG Wet Prep GC/CT -- pending HIV -- pending OB < 14 wks [place] with TV COVID-19 swab -- Results pending   Results for orders placed or performed during the hospital encounter of 07/25/20 (from the past 24 hour(s))  Pregnancy, urine POC     Status: Abnormal   Collection Time: 07/25/20 10:29 AM  Result Value Ref Range   Preg Test, Ur POSITIVE (A) NEGATIVE  Urinalysis, Routine w reflex microscopic     Status: None   Collection Time: 07/25/20 10:31 AM  Result Value Ref Range   Color, Urine YELLOW YELLOW   APPearance CLEAR CLEAR   Specific Gravity, Urine 1.014 1.005 - 1.030   pH 5.0 5.0 - 8.0   Glucose, UA NEGATIVE NEGATIVE mg/dL   Hgb urine dipstick NEGATIVE NEGATIVE   Bilirubin Urine NEGATIVE NEGATIVE   Ketones, ur NEGATIVE NEGATIVE mg/dL   Protein, ur NEGATIVE NEGATIVE mg/dL   Nitrite NEGATIVE NEGATIVE   Leukocytes,Ua NEGATIVE NEGATIVE  Wet prep, genital     Status: Abnormal   Collection Time: 07/25/20 11:20 AM  Result Value Ref Range   Yeast Wet Prep HPF POC NONE SEEN NONE SEEN   Trich, Wet Prep NONE SEEN NONE SEEN   Clue Cells Wet Prep HPF POC NONE SEEN NONE SEEN   WBC, Wet Prep HPF POC FEW (A) NONE SEEN   Sperm NONE SEEN   Respiratory Panel by RT PCR (Flu A&B, Covid) - Nasopharyngeal Swab     Status: None   Collection Time: 07/25/20 11:25 AM   Specimen: Nasopharyngeal Swab  Result Value Ref Range   SARS Coronavirus 2 by RT PCR NEGATIVE NEGATIVE   Influenza A by PCR NEGATIVE NEGATIVE   Influenza B by PCR NEGATIVE NEGATIVE  CBC     Status: None   Collection Time: 07/25/20 11:39 AM  Result Value Ref Range   WBC 6.4 4.0 - 10.5 K/uL   RBC 4.67 3.87 - 5.11 MIL/uL   Hemoglobin 13.7 12.0 - 15.0 g/dL   HCT 57.9 36 - 46 %   MCV 88.2 80.0 - 100.0 fL   MCH 29.3 26.0 - 34.0 pg   MCHC 33.3 30.0 - 36.0 g/dL   RDW  03.8 33.3 - 83.2 %   Platelets 357 150 - 400 K/uL   nRBC 0.0 0.0 - 0.2 %  hCG,  quantitative, pregnancy     Status: Abnormal   Collection Time: 07/25/20 11:39 AM  Result Value Ref Range   hCG, Beta Chain, Quant, S 5,415 (H) <5 mIU/mL  HIV Antibody (routine testing w rflx)     Status: None   Collection Time: 07/25/20 11:39 AM  Result Value Ref Range   HIV Screen 4th Generation wRfx Non Reactive Non Reactive    US OB LESS THAN 14 WEEKS WITH OB TRANSVAGINAL  Result Date: 07/25/2020 CLINICAL DATA:  34 year old pregnant female with pelvic pain and spotting. Estimated gestational age of [redacted] weeks 5 days by LMP. EXAM: OBSTETRIC <14 WK [place] AND TRANSVAGINAL OB US TECHNIQUE: Both transabdominal and transvaginal ultrasound examinations were performed for complete evaluation of the gestation as well as the maternal uterus, adnexal regions, and pelvic cul-de-sac. Transvaginal technique was performed to assess early pregnancy. COMPARISON:  None. FINDINGS: Intrauterine gestational sac: Single Yolk sac:  Visualized. Embryo:  Visualized. Cardiac Activity: Visualized. Heart Rate: 121 bpm CRL:  2.1 mm   5 w   5 d                  [place] EDC: 03/22/2021 Subchorionic hemorrhage:  None visualized. Maternal uterus/adnexae: The ovaries bilaterally are unremarkable. No adnexal mass or free fluid. IMPRESSION: Single living intrauterine gestation with estimated gestational age of [redacted] weeks 5 days by this ultrasound. No subchorionic hemorrhage or acute abnormalities. Electronically Signed   By: Harmon Pier M.D.   On: 07/25/2020 12:38     Assessment and Plan  Abdominal pain during pregnancy in first trimester  - Information provided on abdominal pain in pregnancy   Spotting affecting pregnancy in first trimester - Reassurance given that no blood was detected on exam  Back pain affecting pregnancy in first trimester  - Information provided on back pain in pregnancy - Advised to take Tylenol 1000 mg every 6 hours prn pain   -  Safe Meds in Preg list given  - Discharge home - Get scheduled for [facility] as soon as possible - Patient verbalized an understanding of the plan of care and agrees.   Raelyn Mora, MSN, CNM 07/25/2020, 11:20 AM

## 2020-07-25 NOTE — MAU Note (Signed)
Pt reports to mau with c/o lower abd and back pain since yesterday.  Pt also reports light brown dc that is now pink.

## 2020-07-25 NOTE — Discharge Instructions (Signed)

## 2020-07-27 LAB — CULTURE, GROUP A STREP (THRC)

## 2020-07-27 LAB — GC/CHLAMYDIA PROBE AMP (~~LOC~~) NOT AT ARMC
Chlamydia: NEGATIVE
Comment: NEGATIVE
Comment: NORMAL
Neisseria Gonorrhea: NEGATIVE

## 2020-08-01 ENCOUNTER — Inpatient Hospital Stay (HOSPITAL_COMMUNITY)
Admission: AD | Admit: 2020-08-01 | Discharge: 2020-08-01 | Disposition: A | Discharge status: Home / Self Care | Payer: Self-pay | Attending: Obstetrics and Gynecology | Admitting: Obstetrics and Gynecology

## 2020-08-01 ENCOUNTER — Encounter (HOSPITAL_COMMUNITY): Payer: Self-pay | Admitting: Obstetrics and Gynecology

## 2020-08-01 ENCOUNTER — Inpatient Hospital Stay (HOSPITAL_COMMUNITY): Payer: Self-pay

## 2020-08-01 ENCOUNTER — Other Ambulatory Visit: Payer: Self-pay

## 2020-08-01 DIAGNOSIS — M545 Low back pain, unspecified: Secondary | ICD-10-CM | POA: Insufficient documentation

## 2020-08-01 DIAGNOSIS — Z3A01 Less than 8 weeks gestation of pregnancy: Secondary | ICD-10-CM | POA: Insufficient documentation

## 2020-08-01 DIAGNOSIS — O208 Other hemorrhage in early pregnancy: Secondary | ICD-10-CM

## 2020-08-01 DIAGNOSIS — O021 Missed abortion: Secondary | ICD-10-CM | POA: Insufficient documentation

## 2020-08-01 DIAGNOSIS — Z8719 Personal history of other diseases of the digestive system: Secondary | ICD-10-CM | POA: Insufficient documentation

## 2020-08-01 DIAGNOSIS — N939 Abnormal uterine and vaginal bleeding, unspecified: Secondary | ICD-10-CM

## 2020-08-01 LAB — URINALYSIS, ROUTINE W REFLEX MICROSCOPIC
Bilirubin Urine: NEGATIVE
Glucose, UA: NEGATIVE mg/dL
Ketones, ur: NEGATIVE mg/dL
Leukocytes,Ua: NEGATIVE
Nitrite: NEGATIVE
Protein, ur: NEGATIVE mg/dL
Specific Gravity, Urine: 1.012 (ref 1.005–1.030)
pH: 5 (ref 5.0–8.0)

## 2020-08-01 LAB — CBC
HCT: 39.6 % (ref 36.0–46.0)
Hemoglobin: 13.1 g/dL (ref 12.0–15.0)
MCH: 29.8 pg (ref 26.0–34.0)
MCHC: 33.1 g/dL (ref 30.0–36.0)
MCV: 90 fL (ref 80.0–100.0)
Platelets: 352 10*3/uL (ref 150–400)
RBC: 4.4 MIL/uL (ref 3.87–5.11)
RDW: 12.8 % (ref 11.5–15.5)
WBC: 7.2 10*3/uL (ref 4.0–10.5)
nRBC: 0 % (ref 0.0–0.2)

## 2020-08-01 LAB — HCG, QUANTITATIVE, PREGNANCY: hCG, Beta Chain, Quant, S: 5279 m[IU]/mL — ABNORMAL HIGH (ref <5)

## 2020-08-01 MED ORDER — PROMETHAZINE HCL 25 MG PO TABS: 25.0000 mg | ORAL_TABLET | Freq: Four times a day (QID) | ORAL | 0 refills | Status: DC | PRN

## 2020-08-01 MED ORDER — ACETAMINOPHEN 500 MG PO TABS
1000.0000 mg | ORAL_TABLET | ORAL | Status: AC
  Administered 2020-08-01: 1000 mg via ORAL
  Filled 2020-08-01: qty 2

## 2020-08-01 MED ORDER — OXYCODONE-ACETAMINOPHEN 5-325 MG PO TABS
1.0000 | ORAL_TABLET | ORAL | 0 refills | Status: DC | PRN
Start: 2020-08-01 — End: 2021-01-27

## 2020-08-01 MED ORDER — MISOPROSTOL 200 MCG PO TABS: 800.0000 ug | ORAL_TABLET | Freq: Once | ORAL | 0 refills | Status: DC

## 2020-08-01 MED ORDER — IBUPROFEN 800 MG PO TABS: 800.0000 mg | ORAL_TABLET | Freq: Three times a day (TID) | ORAL | 0 refills | Status: DC

## 2020-08-01 NOTE — Discharge Instructions (Signed)
Aborto espontneo Miscarriage El aborto espontneo es la prdida de un beb que no ha nacido (feto) antes de la semana20 del embarazo. Siga estas indicaciones en su casa: Medicamentos   Tome los medicamentos de venta libre y los recetados solamente como se lo haya indicado el mdico.  Si le recetaron un antibitico, tmelo como se lo haya indicado el mdico. No deje de tomar los antibiticos aunque comience a sentirse mejor.  No tome antiinflamatorios no esteroideos (AINE), a menos que el mdico le diga que son seguros para usted. Estos incluyen aspirina e ibuprofeno. Estos medicamentos pueden provocarle sangrado. Actividad  Haga reposo segn lo indicado. Pregntele al mdico qu actividades son seguras para usted.  Pida ayuda para realizar las tareas de la casa durante este tiempo. Instrucciones generales  Anote cuntos apsitos usa por da y cun saturados estn.  Observe la cantidad de tejido o grumos de sangre (cogulos de sangre) que expulsa por la vagina. Guarde las cantidades grandes de tejido para llevrselas al mdico.  No use tampones, no se haga duchas vaginales ni tenga relaciones sexuales hasta que el mdico la autorice.  Para que usted y su pareja puedan sobrellevar el proceso de duelo, hable con su mdico o busque apoyo psicolgico.  Cuando est lista, acuda al mdico para hablar sobre los pasos que debe seguir para cuidar su salud. Adems, hable con su mdico sobre las medidas que debe adoptar para tener un embarazo saludable en el futuro.  Concurra a todas las visitas de seguimiento como se lo haya indicado el mdico. Esto es importante. Comunquese con un mdico si:  Tiene fiebre o siente escalofros.  Tiene una secrecin vaginal con mal olor.  Aumenta el sangrado. Solicite ayuda de inmediato si:  Tiene espasmos o dolor muy intensos en el abdomen o en la espalda.  Elimina grumos de sangre por la vagina, que tienen el tamao de una nuez o ms.  Elimina  tejido por la vagina, que tiene el tamao de una nuez o ms.  Empapa ms de un apsito de tamao normal por hora.  Se siente dbil o mareada.  Pierde el conocimiento (se desmaya).  Siente tristeza que no se va o piensa en lastimarse. Resumen  El aborto espontneo es la prdida de un beb que no ha nacido antes de la semana20 del embarazo.  Siga las indicaciones de su mdico para el cuidado en su hogar. Concurra a todas las visitas de control.  Para que usted y su pareja puedan sobrellevar el proceso de duelo, hable con su mdico o busque apoyo psicolgico. Esta informacin no tiene como fin reemplazar el consejo del mdico. Asegrese de hacerle al mdico cualquier pregunta que tenga. Document Revised: 06/26/2017 Document Reviewed: 06/26/2017 Elsevier Patient Education  2020 Elsevier Inc.  

## 2020-08-01 NOTE — MAU Note (Signed)
[name] is a 34 y.o. at [redacted]w[redacted]d here in MAU reporting: is having lower back pain and abdominal pain, states it is worse than previous visit. States pain returned on Friday morning. States she is noticing bleeding when she uses the bathroom, states there is bleeding on the toilet paper. No clots.   Onset of complaint: ongoing but worse  Pain score: abdominal pain 10/10, back pain 10/10  Vitals:   08/01/20 0724  BP: 108/66  Pulse: 92  Resp: 16  Temp: 98.3 F (36.8 C)  SpO2: 100%     Lab orders placed from triage: UA

## 2020-08-01 NOTE — MAU Provider Note (Addendum)
History     CSN: [id number]  Arrival date and time: 08/01/20 4315   None     Chief Complaint  Patient presents with  . Abdominal Pain  . Back Pain  . Vaginal Bleeding   HPI   Patient is a g4p3003 at 6wk5d who presents to the MAU with abdominal pain and vaginal bleeding. She was seen for a similar issue on 10/23 and had an [place] confirming a viable IUP. She reports that over the past week she has had abdominal cramping and intermittent spotting. She describes this getting much worse today and says that she is now having heavy bleeding and passing some large greater than coin sized clots. She also endorses dizziness. Reports that her pain is 10/10. Reports this is a desired pregnancy. Also reports some urinary frequency to RN, though denies this on my report. She does endorse some back pain.  Of note, patient has a history of hemorrhoids and lower back pain that she has been seen for in the outpatient setting.    OB History    Gravida  4   Para  3   Term  3   Preterm  0   AB  0   Living  3     SAB  0   TAB  0   Ectopic  0   Multiple      Live Births  3           Past Medical History:  Diagnosis Date  . Bulimia   . Bulimia nervosa   . Eating disorder   . GERD (gastroesophageal reflux disease)     Past Surgical History:  Procedure Laterality Date  . COLONOSCOPY WITH PROPOFOL N/A 03/18/2016   Procedure: COLONOSCOPY WITH PROPOFOL;  Surgeon: Vida Rigger, MD;  Location: [facility];  Service: Endoscopy;  Laterality: N/A;  . ESOPHAGOGASTRODUODENOSCOPY (EGD) WITH PROPOFOL N/A 09/22/2017   Procedure: ESOPHAGOGASTRODUODENOSCOPY (EGD) WITH PROPOFOL;  Surgeon: Vida Rigger, MD;  Location: [facility];  Service: Endoscopy;  Laterality: N/A;  . NO PAST SURGERIES      Family History  Problem Relation Age of Onset  . Diabetes Father   . Hearing loss Neg Hx   . Cancer Neg Hx   . Heart failure Neg Hx     Social History   Tobacco Use  . Smoking status: Never  Smoker  . Smokeless tobacco: Never Used  Vaping Use  . Vaping Use: Never used  Substance Use Topics  . Alcohol use: No  . Drug use: No    Allergies: No Known Allergies  Medications Prior to Admission  Medication Sig Dispense Refill Last Dose  . cetirizine (ZYRTEC) 10 MG tablet Take 1 tablet (10 mg total) by mouth daily. (Patient not taking: Reported on 03/25/2020) 30 tablet 11   . fluticasone (FLONASE) 50 MCG/ACT nasal spray Place 2 sprays into both nostrils daily. (Patient not taking: Reported on 03/25/2020) 16 g 6   . hydrocortisone-pramoxine (ANALPRAM HC) 2.5-1 % rectal cream Place 1 application rectally 3 (three) times daily. 30 g 1   . lidocaine (XYLOCAINE) 2 % solution Use as directed 10 mLs in the mouth or throat as needed for mouth pain. 100 mL 0   . methocarbamol (ROBAXIN) 500 MG tablet Take 1 tablet (500 mg total) by mouth 4 (four) times daily. X 7days then prn (Patient not taking: Reported on 03/25/2020) 90 tablet 0   . triamcinolone cream (KENALOG) 0.1 % Apply 1 application topically 2 (two) times daily. (Patient  not taking: Reported on 01/08/2020) 30 g 2     Review of Systems  Constitutional: Negative for chills, fatigue and fever.  Respiratory: Negative for chest tightness.   Gastrointestinal: Positive for abdominal pain. Negative for diarrhea, nausea and vomiting.  Genitourinary: Negative for dysuria and flank pain.  Musculoskeletal: Positive for back pain.  Neurological: Positive for dizziness. Negative for headaches.   Physical Exam   Blood pressure 108/66, pulse 92, temperature 98.3 F (36.8 C), temperature source Oral, resp. rate 16, height 5\' 3"  (1.6 m), weight 58 kg, last menstrual period 06/01/2020, SpO2 100 %.  Physical Exam Vitals and nursing note reviewed.  Constitutional:      General: She is not in acute distress.    Appearance: She is well-developed. She is not toxic-appearing.  Cardiovascular:     Rate and Rhythm: Normal rate.  Pulmonary:     Effort:  Pulmonary effort is normal.  Abdominal:     Palpations: Abdomen is soft.     Comments: Abdomen is soft and non distended, tender to palpation of bilateral lower quadrants without rebound or guarding   Skin:    General: Skin is warm and dry.  Neurological:     Mental Status: She is alert.  Psychiatric:        Mood and Affect: Mood normal.        Behavior: Behavior normal.     MAU Course  Procedures Results for orders placed or performed during the hospital encounter of 08/01/20 (from the past 24 hour(s))  Urinalysis, Routine w reflex microscopic Urine, Clean Catch     Status: Abnormal   Collection Time: 08/01/20  7:38 AM  Result Value Ref Range   Color, Urine YELLOW YELLOW   APPearance CLEAR CLEAR   Specific Gravity, Urine 1.012 1.005 - 1.030   pH 5.0 5.0 - 8.0   Glucose, UA NEGATIVE NEGATIVE mg/dL   Hgb urine dipstick LARGE (A) NEGATIVE   Bilirubin Urine NEGATIVE NEGATIVE   Ketones, ur NEGATIVE NEGATIVE mg/dL   Protein, ur NEGATIVE NEGATIVE mg/dL   Nitrite NEGATIVE NEGATIVE   Leukocytes,Ua NEGATIVE NEGATIVE   RBC / HPF 6-10 0 - 5 RBC/hpf   WBC, UA 0-5 0 - 5 WBC/hpf   Bacteria, UA RARE (A) NONE SEEN   Squamous Epithelial / LPF 0-5 0 - 5   Mucus PRESENT   CBC     Status: None   Collection Time: 08/01/20  8:03 AM  Result Value Ref Range   WBC 7.2 4.0 - 10.5 K/uL   RBC 4.40 3.87 - 5.11 MIL/uL   Hemoglobin 13.1 12.0 - 15.0 g/dL   HCT 08/03/20 36 - 46 %   MCV 90.0 80.0 - 100.0 fL   MCH 29.8 26.0 - 34.0 pg   MCHC 33.1 30.0 - 36.0 g/dL   RDW 35.7 01.7 - 79.3 %   Platelets 352 150 - 400 K/uL   nRBC 0.0 0.0 - 0.2 %  hCG, quantitative, pregnancy     Status: Abnormal   Collection Time: 08/01/20  8:05 AM  Result Value Ref Range   hCG, Beta Chain, Quant, S 5,279 (H) <5 mIU/mL   08/03/20 OB Transvaginal  Result Date: 08/01/2020 CLINICAL DATA:  Pregnant patient.  Vaginal bleeding. EXAM: TRANSVAGINAL OB ULTRASOUND TECHNIQUE: Transvaginal ultrasound was performed for complete evaluation  of the gestation as well as the maternal uterus, adnexal regions, and pelvic cul-de-sac. COMPARISON:  None. FINDINGS: Intrauterine gestational sac: Single Yolk sac:  Visualized. Embryo:  Visualized. Cardiac Activity: Not  Visualized. MSD:   mm    w     d CRL:   5.5 mm   6 w 2 d                  [place] [facility]: March 25, 2021 Subchorionic hemorrhage:  None visualized. Maternal uterus/adnexae: The ovaries are normal in appearance. No free fluid in the pelvis. IMPRESSION: 1. An intrauterine pregnancy is again identified. However, cardiac activity is not identified on today's study. Cardiac activity was seen on the previous study. The lack of cardiac activity in a fetus with a crown-rump length of 5.5 mm is suspicious but not diagnostic for non viability. However, the fact that cardiac activity was seen previously but not today is highly suspicious for fetal demise. Recommend follow-up US in 10-14 days for definitive diagnosis. Electronically Signed   By: Gerome Sam III M.D   On: 08/01/2020 09:04    MDM Patient evaluated at bedside with the use of ipad interpreter   Pt informed that the ultrasound is considered a limited OB ultrasound and is not intended to be a complete ultrasound exam.  Patient also informed that the ultrasound is not being completed with the intent of assessing for fetal or placental anomalies or any pelvic abnormalities.  Explained that the purpose of today's ultrasound is to assess for  viability.  Patient acknowledges the purpose of the exam and the limitations of the study.    Bedside US done, unable to confirm viability  CBC, UA, HCG, [place]  1g Tylenol for pain   Patient signed out to [facility] CNM    Casper Harrison, MD   Ultrasound shows no growth in pregnancy and now no FHR. Last ultrasound showed viable fetus. This loss of FHR is consistent with miscarriage.   Reviewed results with patient. Patient understandably upset. Options for management including expectant management,  cytotec or D&C reviewed with patient. Patient desires medical management. Reviewed bleeding and pain precautions at length with patient.   Assessment and Plan   1. Missed abortion   2. Vaginal bleeding   3. [redacted] weeks gestation of pregnancy    -Discharge home in stable condition -Rx for cytotec, ibuprofen, phenergan and percocet sent to patient's pharmacy -Bleeding and pain precautions discussed -Patient advised to follow-up with [facility] in 1 week for repeat HCG and 2 weeks with a provider, message sent -Patient may return to MAU as needed or if her condition were to change or worsen   Rolm Bookbinder, CNM 08/01/20 10:25 AM

## 2020-08-06 ENCOUNTER — Ambulatory Visit: Payer: Self-pay | Attending: Physician Assistant | Admitting: Physician Assistant

## 2020-08-06 ENCOUNTER — Encounter: Payer: Self-pay | Admitting: Physician Assistant

## 2020-08-06 ENCOUNTER — Other Ambulatory Visit: Payer: Self-pay

## 2020-08-06 VITALS — BP 102/67 | HR 82 | Ht 63.0 in | Wt 129.0 lb

## 2020-08-06 DIAGNOSIS — R300 Dysuria: Secondary | ICD-10-CM

## 2020-08-06 DIAGNOSIS — O039 Complete or unspecified spontaneous abortion without complication: Secondary | ICD-10-CM

## 2020-08-06 DIAGNOSIS — K5900 Constipation, unspecified: Secondary | ICD-10-CM

## 2020-08-06 DIAGNOSIS — M545 Low back pain, unspecified: Secondary | ICD-10-CM

## 2020-08-06 DIAGNOSIS — Z09 Encounter for follow-up examination after completed treatment for conditions other than malignant neoplasm: Secondary | ICD-10-CM

## 2020-08-06 LAB — POCT URINALYSIS DIP (CLINITEK)
Bilirubin, UA: NEGATIVE
Blood, UA: NEGATIVE
Glucose, UA: NEGATIVE mg/dL
Ketones, POC UA: NEGATIVE mg/dL
Leukocytes, UA: NEGATIVE
Nitrite, UA: NEGATIVE
POC PROTEIN,UA: NEGATIVE
Spec Grav, UA: 1.01 (ref 1.010–1.025)
Urobilinogen, UA: 0.2 E.U./dL
pH, UA: 6 (ref 5.0–8.0)

## 2020-08-06 MED ORDER — IBUPROFEN 600 MG PO TABS: 600.0000 mg | ORAL_TABLET | Freq: Three times a day (TID) | ORAL | 0 refills | Status: DC | PRN

## 2020-08-06 MED ORDER — POLYETHYLENE GLYCOL 3350 17 GM/SCOOP PO POWD: 17.0000 g | Freq: Two times a day (BID) | ORAL | 1 refills | Status: DC | PRN

## 2020-08-06 MED FILL — ?IBUPROFEN 600 MG TABLETS: 600 | 10 days supply | Qty: 30 | Fill #0

## 2020-08-06 NOTE — Progress Notes (Signed)
Patient ID: [name], female   DOB: Feb 21, 1986, 34 y.o.   MRN: [id number]     [name], is a 34 y.o. female  XLK:[id number]  [id number]:[id number]  DOB - 05-21-1986  Subjective:  Chief Complaint and HPI: [name] is a 34 y.o. female here today  for a follow up visit Seen 10/23 in ED and had ultrasound that revealed intrauterine pregnancy with heart beat.  She was having mild vaginal bleeding then.  Vaginal bleeding increased and she  Went back to the ED 10/30 and there was no longer a heartbeat.  Bleeding has since stopped.  No abdominal pain but she has had some constipation from the pain meds.  No fever.  No cramping.  No vaginal discharge.  Needs repeat u/s  3-4 days of some pain with urination.  Some mild back aching.    ED/Hospital notes reviewed.    ROS:   Constitutional:  No f/c, No night sweats, No unexplained weight loss. EENT:  No vision changes, No blurry vision, No hearing changes. No mouth, throat, or ear problems.  Respiratory: No cough, No SOB Cardiac: No CP, no palpitations GI:  No abd pain, No N/V/D. GU:see above Musculoskeletal: No joint pain Neuro: No headache, no dizziness, no motor weakness.  Skin: No rash Endocrine:  No polydipsia. No polyuria.  Psych: Denies SI/HI  No problems updated.  ALLERGIES: No Known Allergies  PAST MEDICAL HISTORY: Past Medical History:  Diagnosis Date  . Bulimia   . Bulimia nervosa   . Eating disorder   . GERD (gastroesophageal reflux disease)     MEDICATIONS AT HOME: Prior to Admission medications   Medication Sig Start Date End Date Taking? Authorizing Provider  cetirizine (ZYRTEC) 10 MG tablet Take 1 tablet (10 mg total) by mouth daily. Patient not taking: Reported on 03/25/2020 01/08/20   Anders Simmonds, PA-C  fluticasone [facility]) 50 MCG/ACT nasal spray Place 2 sprays into both nostrils daily. Patient not taking: Reported on 03/25/2020 01/08/20   Anders Simmonds, PA-C  hydrocortisone-pramoxine [facility]) 2.5-1 % rectal cream Place 1 application rectally 3 (three) times daily. 03/25/20   [name], MD  ibuprofen (ADVIL) 600 MG tablet Take 1 tablet (600 mg total) by mouth every 8 (eight) hours as needed for moderate pain. 08/06/20   Anders Simmonds, PA-C  lidocaine (XYLOCAINE) 2 % solution Use as directed 10 mLs in the mouth or throat as needed for mouth pain. 07/24/20   Particia Nearing, PA-C  misoprostol (CYTOTEC) 200 MCG tablet Take 4 tablets (800 mcg total) by mouth once for 1 dose. 08/01/20 08/01/20  Rolm Bookbinder, CNM  oxyCODONE-acetaminophen (PERCOCET/ROXICET) 5-325 MG tablet Take 1 tablet by mouth every 4 (four) hours as needed for severe pain. 08/01/20   Rolm Bookbinder, CNM  polyethylene glycol powder (GLYCOLAX/MIRALAX) 17 GM/SCOOP powder Take 17 g by mouth 2 (two) times daily as needed. Prn constipation 08/06/20   Anders Simmonds, PA-C  promethazine (PHENERGAN) 25 MG tablet Take 1 tablet (25 mg total) by mouth every 6 (six) hours as needed for nausea or vomiting. 08/01/20   Rolm Bookbinder, CNM  triamcinolone cream (KENALOG) 0.1 % Apply 1 application topically 2 (two) times daily. Patient not taking: Reported on 01/08/2020 05/13/19   [name], MD     Objective:  EXAM:   Vitals:   08/06/20 1517  BP: 102/67  Pulse: 82  SpO2: 100%  Weight: 129 lb (58.5 kg)  Height: 5\' 3"  (1.6 m)  General appearance : A&OX3. NAD. Non-toxic-appearing HEENT: Atraumatic and Normocephalic.  PERRLA. EOM intact.   Chest/Lungs:  Breathing-non-labored, Good air entry bilaterally, breath sounds normal without rales, rhonchi, or wheezing  CVS: S1 S2 regular, no murmurs, gallops, rubs  Abdomen: Bowel sounds present, Non tender and not distended with no gaurding, rigidity or rebound. No CVA TTP Extremities: Bilateral Lower Ext shows no edema, both legs are warm to touch with = pulse throughout Neurology:  CN II-XII grossly intact, Non focal.   Psych:  TP linear. J/I WNL. Normal  speech. Appropriate eye contact and affect.  Skin:  No Rash  Data Review Lab Results  Component Value Date   HGBA1C 5.2 11/05/2019   HGBA1C 5.2 10/10/2017     Assessment & Plan   1. Dysuria Urine is ok.  Increase water intake - POCT URINALYSIS DIP (CLINITEK)  2. Spontaneous abortion Will reassess with U/S as recommended - Ambulatory referral to Gynecology - US OB Transvaginal; Future - Beta hCG quant (ref lab)  3. Constipation, unspecified constipation type Increase water intake.   - polyethylene glycol powder (GLYCOLAX/MIRALAX) 17 GM/SCOOP powder; Take 17 g by mouth 2 (two) times daily as needed. Prn constipation  Dispense: 3350 g; Refill: 1 - CBC with Differential/Platelet - Comprehensive metabolic panel  4. Acute low back pain without sciatica, unspecified back pain laterality - polyethylene glycol powder (GLYCOLAX/MIRALAX) 17 GM/SCOOP powder; Take 17 g by mouth 2 (two) times daily as needed. Prn constipation  Dispense: 3350 g; Refill: 1  5. Hospital discharge follow-up   Patient have been counseled extensively about nutrition and exercise  Return if symptoms worsen or fail to improve.  The patient was given clear instructions to go to ER or return to medical center if symptoms don't improve, worsen or new problems develop. The patient verbalized understanding. The patient was told to call to get lab results if they haven't heard anything in the next week.     Georgian Co, PA-C [facility] [place], [place] [phone]   08/06/2020, 4:03 PM

## 2020-08-07 LAB — CBC WITH DIFFERENTIAL/PLATELET
Basophils Absolute: 0 10*3/uL (ref 0.0–0.2)
Basos: 0 %
EOS (ABSOLUTE): 0 10*3/uL (ref 0.0–0.4)
Eos: 0 %
Hematocrit: 40.3 % (ref 34.0–46.6)
Hemoglobin: 13.3 g/dL (ref 11.1–15.9)
Immature Grans (Abs): 0 10*3/uL (ref 0.0–0.1)
Immature Granulocytes: 0 %
Lymphocytes Absolute: 2.3 10*3/uL (ref 0.7–3.1)
Lymphs: 33 %
MCH: 30.2 pg (ref 26.6–33.0)
MCHC: 33 g/dL (ref 31.5–35.7)
MCV: 91 fL (ref 79–97)
Monocytes Absolute: 0.6 10*3/uL (ref 0.1–0.9)
Monocytes: 8 %
Neutrophils Absolute: 4 10*3/uL (ref 1.4–7.0)
Neutrophils: 59 %
Platelets: 418 10*3/uL (ref 150–450)
RBC: 4.41 x10E6/uL (ref 3.77–5.28)
RDW: 12.8 % (ref 11.7–15.4)
WBC: 6.9 10*3/uL (ref 3.4–10.8)

## 2020-08-07 LAB — COMPREHENSIVE METABOLIC PANEL
ALT: 18 IU/L (ref 0–32)
AST: 20 IU/L (ref 0–40)
Albumin/Globulin Ratio: 2.1 (ref 1.2–2.2)
Albumin: 4.9 g/dL — ABNORMAL HIGH (ref 3.8–4.8)
Alkaline Phosphatase: 92 IU/L (ref 44–121)
BUN/Creatinine Ratio: 18 (ref 9–23)
BUN: 11 mg/dL (ref 6–20)
Bilirubin Total: 0.2 mg/dL (ref 0.0–1.2)
CO2: 25 mmol/L (ref 20–29)
Calcium: 9.6 mg/dL (ref 8.7–10.2)
Chloride: 102 mmol/L (ref 96–106)
Creatinine, Ser: 0.62 mg/dL (ref 0.57–1.00)
GFR calc Af Amer: 135 mL/min/{1.73_m2} (ref >59)
GFR calc non Af Amer: 117 mL/min/{1.73_m2} (ref >59)
Globulin, Total: 2.3 g/dL (ref 1.5–4.5)
Glucose: 89 mg/dL (ref 65–99)
Potassium: 4.3 mmol/L (ref 3.5–5.2)
Sodium: 139 mmol/L (ref 134–144)
Total Protein: 7.2 g/dL (ref 6.0–8.5)

## 2020-08-07 LAB — BETA HCG QUANT (REF LAB): hCG Quant: 212 m[IU]/mL

## 2020-08-11 ENCOUNTER — Ambulatory Visit: Payer: Self-pay | Admitting: Family Medicine

## 2020-08-11 ENCOUNTER — Telehealth: Payer: Self-pay | Admitting: General Practice

## 2020-08-11 NOTE — Telephone Encounter (Signed)
Patient called into front office with pacific interpreter [phone] asking if her ultrasound appt on 11/18 was still necessary. Per chart review, patient had SAB confirmed on 10/30 in [facility]U. She was seen by her PCP on 11/4 and an ultrasound and bhcg level was ordered. Patient's bhcg level dropped from 5,279 on 10/30 to 212 on 11/9. Discussed with patient ultrasound isn't necessary and reviewed appts in office for bhcg and follow up with a provider. Reviewed with patient decreasing bhcg levels are reassuring to [place] that her body is recovering from the miscarriage and slowly going back to normal. Patient states her doctor ordered the ultrasound though. Reviewed with patient reasons we are reassured ultrasound is not needed at this time and discussed that her primary care doctor ordered that, not [place]. Discussed we normally would not order an ultrasound at this time. Patient verbalized understanding & states on Friday she had some brown spots and a foul odor. Discussed with patient the odor is likely old blood which does have a smell to it & the brown discharge also indicates old blood. Reviewed concerning symptoms of a uterine infection with patient. Patient verbalized understanding and states she did have some mild burning with urination lately. Discussed with patient I can see where she saw her doctor for that the other day and her UA was normal. Advised she drink at least 2 liters of water a day as that can sometimes help to flush out beginning UTIs. Patient verbalized understanding. Reviewed upcoming appts with patient as well as office address. Patient verbalized understanding. Called ultrasound & canceled 11/18 appt.

## 2020-08-12 ENCOUNTER — Ambulatory Visit: Payer: Self-pay | Attending: Family Medicine

## 2020-08-12 ENCOUNTER — Other Ambulatory Visit: Payer: Self-pay

## 2020-08-12 ENCOUNTER — Other Ambulatory Visit: Payer: Self-pay | Admitting: Physician Assistant

## 2020-08-12 DIAGNOSIS — O039 Complete or unspecified spontaneous abortion without complication: Secondary | ICD-10-CM

## 2020-08-13 ENCOUNTER — Ambulatory Visit: Admission: RE | Admit: 2020-08-13 | Payer: Self-pay | Source: Ambulatory Visit

## 2020-08-14 MED FILL — ?IBUPROFEN 600 MG TABLETS: 600 | 10 days supply | Qty: 30 | Fill #0

## 2020-08-19 ENCOUNTER — Other Ambulatory Visit: Payer: Self-pay

## 2020-08-19 ENCOUNTER — Other Ambulatory Visit: Payer: Self-pay | Admitting: *Deleted

## 2020-08-19 DIAGNOSIS — O021 Missed abortion: Secondary | ICD-10-CM

## 2020-08-20 ENCOUNTER — Ambulatory Visit: Admission: RE | Admit: 2020-08-20 | Payer: Self-pay | Source: Ambulatory Visit

## 2020-08-20 LAB — BETA HCG QUANT (REF LAB): hCG Quant: 2 m[IU]/mL

## 2020-08-25 ENCOUNTER — Encounter: Payer: Self-pay | Admitting: Family Medicine

## 2020-08-25 ENCOUNTER — Ambulatory Visit: Payer: Self-pay | Admitting: Family Medicine

## 2020-09-02 ENCOUNTER — Other Ambulatory Visit: Payer: Self-pay

## 2020-09-02 ENCOUNTER — Ambulatory Visit (INDEPENDENT_AMBULATORY_CARE_PROVIDER_SITE_OTHER): Payer: Self-pay | Admitting: Obstetrics and Gynecology

## 2020-09-02 ENCOUNTER — Encounter: Payer: Self-pay | Admitting: Obstetrics and Gynecology

## 2020-09-02 VITALS — BP 125/79 | HR 79 | Wt 135.6 lb

## 2020-09-02 DIAGNOSIS — F32A Depression, unspecified: Secondary | ICD-10-CM

## 2020-09-02 DIAGNOSIS — R300 Dysuria: Secondary | ICD-10-CM

## 2020-09-02 DIAGNOSIS — Z789 Other specified health status: Secondary | ICD-10-CM

## 2020-09-02 DIAGNOSIS — O039 Complete or unspecified spontaneous abortion without complication: Secondary | ICD-10-CM

## 2020-09-02 NOTE — Progress Notes (Signed)
GYNECOLOGY OFFICE FOLLOW UP NOTE  History:  34 y.o. [id number] here today for follow up for SAB. She has had a period that was heavier and more painful than in the past. Also with some urinary pain and painful bowel movements. Takes Linzess and it improves. No other complaints.   Past Medical History:  Diagnosis Date  . Bulimia   . Bulimia nervosa   . Eating disorder   . GERD (gastroesophageal reflux disease)     Past Surgical History:  Procedure Laterality Date  . COLONOSCOPY WITH PROPOFOL N/A 03/18/2016   Procedure: COLONOSCOPY WITH PROPOFOL;  Surgeon: Vida Rigger, MD;  Location: [facility] ENDOSCOPY;  Service: Endoscopy;  Laterality: N/A;  . ESOPHAGOGASTRODUODENOSCOPY (EGD) WITH PROPOFOL N/A 09/22/2017   Procedure: ESOPHAGOGASTRODUODENOSCOPY (EGD) WITH PROPOFOL;  Surgeon: Vida Rigger, MD;  Location: [facility] ENDOSCOPY;  Service: Endoscopy;  Laterality: N/A;  . NO PAST SURGERIES       Current Outpatient Medications:  .  cetirizine (ZYRTEC) 10 MG tablet, Take 1 tablet (10 mg total) by mouth daily. (Patient not taking: Reported on 03/25/2020), Disp: 30 tablet, Rfl: 11 .  fluticasone (FLONASE) 50 MCG/ACT nasal spray, Place 2 sprays into both nostrils daily. (Patient not taking: Reported on 03/25/2020), Disp: 16 g, Rfl: 6 .  hydrocortisone-pramoxine (ANALPRAM HC) 2.5-1 % rectal cream, Place 1 application rectally 3 (three) times daily., Disp: 30 g, Rfl: 1 .  ibuprofen (ADVIL) 600 MG tablet, Take 1 tablet (600 mg total) by mouth every 8 (eight) hours as needed for moderate pain., Disp: 30 tablet, Rfl: 0 .  lidocaine (XYLOCAINE) 2 % solution, Use as directed 10 mLs in the mouth or throat as needed for mouth pain., Disp: 100 mL, Rfl: 0 .  misoprostol (CYTOTEC) 200 MCG tablet, Take 4 tablets (800 mcg total) by mouth once for 1 dose., Disp: 4 tablet, Rfl: 0 .  oxyCODONE-acetaminophen (PERCOCET/ROXICET) 5-325 MG tablet, Take 1 tablet by mouth every 4 (four) hours as needed for severe pain., Disp: 15 tablet,  Rfl: 0 .  polyethylene glycol powder (GLYCOLAX/MIRALAX) 17 GM/SCOOP powder, Take 17 g by mouth 2 (two) times daily as needed. Prn constipation, Disp: 3350 g, Rfl: 1 .  promethazine (PHENERGAN) 25 MG tablet, Take 1 tablet (25 mg total) by mouth every 6 (six) hours as needed for nausea or vomiting., Disp: 30 tablet, Rfl: 0 .  triamcinolone cream (KENALOG) 0.1 %, Apply 1 application topically 2 (two) times daily. (Patient not taking: Reported on 01/08/2020), Disp: 30 g, Rfl: 2  The following portions of the patient's history were reviewed and updated as appropriate: allergies, current medications, past family history, past medical history, past social history, past surgical history and problem list.   Review of Systems:  Pertinent items noted in HPI and remainder of comprehensive ROS otherwise negative.   Objective:  Physical Exam BP 125/79   Pulse 79   Wt 135 lb 9.6 oz (61.5 kg)   LMP 06/01/2020   BMI 24.02 kg/m  CONSTITUTIONAL: Well-developed, well-nourished female in no acute distress.  HENT:  Normocephalic, atraumatic. External right and left ear normal. Oropharynx is clear and moist EYES: Conjunctivae and EOM are normal. Pupils are equal, round, and reactive to light. No scleral icterus.  NECK: Normal range of motion, supple, no masses SKIN: Skin is warm and dry. No rash noted. Not diaphoretic. No erythema. No pallor. NEUROLOGIC: Alert and oriented to person, place, and time. Normal reflexes, muscle tone coordination. No cranial nerve deficit noted. PSYCHIATRIC: Normal mood and affect. Normal behavior.  Normal judgment and thought content. CARDIOVASCULAR: Normal heart rate noted RESPIRATORY: Effort normal, no problems with respiration noted ABDOMEN: Soft, no distention noted.   PELVIC: deferred MUSCULOSKELETAL: Normal range of motion. No edema noted.  Labs and Imaging No results found.  Assessment & Plan:  1. Depression, unspecified depression type Feels she is coping okay -  Ambulatory referral to Integrated Behavioral Health  2. SAB (spontaneous abortion) Resolved with cytotec Reviewed etiology, likely chromosomally abnormal and would not recur  3. Dysuria - Urinalysis, Routine w reflex microscopic  4. Language barrier Engineer, structural used   Routine preventative health maintenance measures emphasized. Please refer to After Visit Summary for other counseling recommendations.   Return if symptoms worsen or fail to improve.  Total face-to-face time with patient: 24 minutes. Over 50% of encounter was spent on counseling and coordination of care.  Baldemar Lenis, M.D. Attending Center for [employer] [facility])

## 2020-09-02 NOTE — Progress Notes (Signed)
Spanish Interpreter Eda R. 

## 2020-09-18 MED FILL — !LINZESS 290 MCG CAPSULE: 290 | 30 days supply | Qty: 30 | Fill #1

## 2020-09-18 MED FILL — DICYCLOMINE 10 MG CAPSULE: 10 | 30 days supply | Qty: 90 | Fill #1

## 2020-09-18 MED FILL — PANTOPRAZOLE SOD DR 40 MG T: 40 | 30 days supply | Qty: 60 | Fill #1

## 2020-10-07 NOTE — BH Specialist Note (Signed)
error 

## 2020-10-15 ENCOUNTER — Ambulatory Visit: Payer: Self-pay | Admitting: Clinical

## 2020-10-15 DIAGNOSIS — F32A Depression, unspecified: Secondary | ICD-10-CM

## 2020-10-15 NOTE — BH Specialist Note (Signed)
Integrated Behavioral Health via Telemedicine Visit  I reviewed patient visit with the [facility] Intern, and I concur with the treatment plan, as documented in the [facility] Intern note.   No charge for this visit due to [facility] Intern seeing patient.  Hulda Marin, MSW, LCSW Integrated Behavioral Health Clinician Center for [facility] at [facility] 09/01/2020 [name] [id number]  Number of Integrated Behavioral Health visits: 1 Session Start time: 2:15pm  Session End time: 2:22pm Total time: 15  Referring Provider: Leroy Libman, MD Patient/Family location: home [facility] Provider location: [facility] All persons participating in visit: pt [name], [facility] intern Darcel Smalling, and interpreter Oswaldo Done [phone]) Types of Service: General Behavioral Integrated Care (BHI)  I connected with [name] and/or [name] patient by Video enabled telemedicine application Caregility and verified that I am speaking with the correct person using two identifiers.    Discussed confidentiality: Yes   I discussed the limitations of telemedicine and the availability of in person appointments.  Discussed there is a possibility of technology failure and discussed alternative modes of communication if that failure occurs.  I discussed that engaging in this telemedicine visit, they consent to the provision of behavioral healthcare and the services will be billed under their insurance.  Patient and/or legal guardian expressed understanding and consented to Telemedicine visit: Yes   Presenting Concerns: Patient and/or family reports the following symptoms/concerns: none. Pt denies any feelings of anxiety or depression and did not wish to meet Duration of problem: n/a; Severity of problem: n/a  Patient and/or Family's Strengths/Protective Factors: n/a  Goals Addressed: Patient will: 1. Call [facility] at [phone] to reschedule if services become  needed  Interventions: Interventions utilized:  n/a Standardized Assessments completed: none as pt wished to not continue with the appointment  Patient and/or Family Response: Pt agrees to call [facility] to reschedule if anything changes  Assessment: Patient currently experiencing no behavioral health symptoms.  Plan: Contact [facility] Jamie if services are needed.  I discussed the assessment and treatment plan with the patient and/or parent/guardian. They were provided an opportunity to ask questions and all were answered. They agreed with the plan and demonstrated an understanding of the instructions.   They were advised to call back or seek an in-person evaluation if the symptoms worsen or if the condition fails to improve as anticipated.  Darcel Smalling [facility] Intern) Hulda Marin, LCSW (Supervisor)

## 2020-11-09 ENCOUNTER — Other Ambulatory Visit: Payer: Self-pay

## 2020-11-09 ENCOUNTER — Encounter (HOSPITAL_COMMUNITY): Payer: Self-pay | Admitting: Emergency Medicine

## 2020-11-09 ENCOUNTER — Ambulatory Visit (HOSPITAL_COMMUNITY)
Admission: EM | Admit: 2020-11-09 | Discharge: 2020-11-09 | Disposition: A | Discharge status: Home / Self Care | Payer: Self-pay | Attending: Internal Medicine | Admitting: Internal Medicine

## 2020-11-09 DIAGNOSIS — Z20822 Contact with and (suspected) exposure to covid-19: Secondary | ICD-10-CM | POA: Insufficient documentation

## 2020-11-09 DIAGNOSIS — B349 Viral infection, unspecified: Secondary | ICD-10-CM | POA: Insufficient documentation

## 2020-11-09 DIAGNOSIS — R079 Chest pain, unspecified: Secondary | ICD-10-CM | POA: Insufficient documentation

## 2020-11-09 LAB — SARS CORONAVIRUS 2 (TAT 6-24 HRS): SARS Coronavirus 2: NEGATIVE

## 2020-11-09 NOTE — ED Provider Notes (Signed)
[facility]    CSN: [id number] Arrival date & time: 11/09/20  1001      History   Chief Complaint Chief Complaint  Patient presents with  . Chest Pain    HPI [name] is a 35 y.o. female.   Patient presents with 1 day history of left chest pain, left shoulder pain, dizziness, fatigue, nausea.  She also reports weakness and numbness on the right side of her body x2 days.  She denies fever, chills, shortness of breath, vomiting, diarrhea, or other symptoms.  Her medical history includes bulimia nervosa, protein calorie malnutrition, depression, syncope, prolonged QT, atypical chest pain, left arm pain, GERD.  The history is provided by the patient and medical records. A language interpreter was used.    Past Medical History:  Diagnosis Date  . Bulimia   . Bulimia nervosa   . Eating disorder   . GERD (gastroesophageal reflux disease)     Patient Active Problem List   Diagnosis Date Noted  . Back pain affecting pregnancy in first trimester 07/25/2020  . Abdominal pain during pregnancy in first trimester 07/25/2020  . Eczema 07/10/2017  . Pregnancy 04/12/2014  . Atypical chest pain 05/15/2013  . Left arm pain 05/15/2013  . Starvation ketoacidosis 04/12/2013  . Syncope 04/12/2013  . Hypokalemia 04/12/2013  . Protein-calorie malnutrition, severe (HCC) 04/12/2013  . Prolonged Q-T interval on ECG 04/12/2013  . Depression 04/16/2012  . Underweight 03/20/2012  . Bulimia nervosa 03/20/2012  . Amenorrhea, secondary 03/20/2012    Past Surgical History:  Procedure Laterality Date  . COLONOSCOPY WITH PROPOFOL N/A 03/18/2016   Procedure: COLONOSCOPY WITH PROPOFOL;  Surgeon: Vida Rigger, MD;  Location: [facility] ENDOSCOPY;  Service: Endoscopy;  Laterality: N/A;  . ESOPHAGOGASTRODUODENOSCOPY (EGD) WITH PROPOFOL N/A 09/22/2017   Procedure: ESOPHAGOGASTRODUODENOSCOPY (EGD) WITH PROPOFOL;  Surgeon: Vida Rigger, MD;  Location: [facility] ENDOSCOPY;  Service: Endoscopy;   Laterality: N/A;  . NO PAST SURGERIES      OB History    Gravida  4   Para  3   Term  3   Preterm  0   AB  0   Living  3     SAB  0   IAB  0   Ectopic  0   Multiple      Live Births  3            Home Medications    Prior to Admission medications   Medication Sig Start Date End Date Taking? Authorizing Provider  cetirizine (ZYRTEC) 10 MG tablet Take 1 tablet (10 mg total) by mouth daily. Patient not taking: Reported on 03/25/2020 01/08/20   Anders Simmonds, PA-C  fluticasone [facility]) 50 MCG/ACT nasal spray Place 2 sprays into both nostrils daily. Patient not taking: Reported on 03/25/2020 01/08/20   Anders Simmonds, PA-C  hydrocortisone-pramoxine [facility]) 2.5-1 % rectal cream Place 1 application rectally 3 (three) times daily. 03/25/20   [name], MD  ibuprofen (ADVIL) 600 MG tablet Take 1 tablet (600 mg total) by mouth every 8 (eight) hours as needed for moderate pain. 08/06/20   Anders Simmonds, PA-C  lidocaine (XYLOCAINE) 2 % solution Use as directed 10 mLs in the mouth or throat as needed for mouth pain. 07/24/20   Particia Nearing, PA-C  misoprostol (CYTOTEC) 200 MCG tablet Take 4 tablets (800 mcg total) by mouth once for 1 dose. 08/01/20 08/01/20  Rolm Bookbinder, CNM  oxyCODONE-acetaminophen (PERCOCET/ROXICET) 5-325 MG tablet Take 1 tablet  by mouth every 4 (four) hours as needed for severe pain. 08/01/20   Rolm Bookbinder, CNM  polyethylene glycol powder (GLYCOLAX/MIRALAX) 17 GM/SCOOP powder Take 17 g by mouth 2 (two) times daily as needed. Prn constipation 08/06/20   Anders Simmonds, PA-C  promethazine (PHENERGAN) 25 MG tablet Take 1 tablet (25 mg total) by mouth every 6 (six) hours as needed for nausea or vomiting. 08/01/20   Rolm Bookbinder, CNM  triamcinolone cream (KENALOG) 0.1 % Apply 1 application topically 2 (two) times daily. Patient not taking: Reported on 01/08/2020 05/13/19   [name], MD    Family History Family  History  Problem Relation Age of Onset  . Diabetes Father   . Hearing loss Neg Hx   . Cancer Neg Hx   . Heart failure Neg Hx     Social History Social History   Tobacco Use  . Smoking status: Never Smoker  . Smokeless tobacco: Never Used  Vaping Use  . Vaping Use: Never used  Substance Use Topics  . Alcohol use: No  . Drug use: No     Allergies   Patient has no known allergies.   Review of Systems Review of Systems  Constitutional: Positive for fatigue. Negative for chills and fever.  HENT: Negative for ear pain and sore throat.   Eyes: Negative for pain and visual disturbance.  Respiratory: Negative for cough and shortness of breath.   Cardiovascular: Positive for chest pain. Negative for palpitations.  Gastrointestinal: Positive for nausea. Negative for abdominal pain, diarrhea and vomiting.  Genitourinary: Negative for dysuria and hematuria.  Musculoskeletal: Positive for arthralgias. Negative for back pain.  Skin: Negative for color change and rash.  Neurological: Positive for dizziness, weakness and numbness. Negative for seizures and syncope.  All other systems reviewed and are negative.    Physical Exam Triage Vital Signs ED Triage Vitals  Enc Vitals Group     BP 11/09/20 1205 127/85     Pulse Rate 11/09/20 1205 86     Resp 11/09/20 1205 18     Temp 11/09/20 1205 98.2 F (36.8 C)     Temp src --      SpO2 11/09/20 1205 100 %     Weight --      Height --      Head Circumference --      Peak Flow --      Pain Score 11/09/20 1206 6     Pain Loc --      Pain Edu? --      Excl. in GC? --    No data found.  Updated Vital Signs BP 127/85 (BP Location: Left Arm)   Pulse 86   Temp 98.2 F (36.8 C)   Resp 18   LMP 10/23/2020   SpO2 100%   Visual Acuity Right Eye Distance:   Left Eye Distance:   Bilateral Distance:    Right Eye Near:   Left Eye Near:    Bilateral Near:     Physical Exam Vitals and nursing note reviewed.  Constitutional:       General: She is not in acute distress.    Appearance: She is well-developed and well-nourished. She is not ill-appearing.  HENT:     Head: Normocephalic and atraumatic.     Mouth/Throat:     Mouth: Mucous membranes are moist.  Eyes:     Conjunctiva/sclera: Conjunctivae normal.  Cardiovascular:     Rate and Rhythm: Normal rate and regular rhythm.  Heart sounds: Normal heart sounds.  Pulmonary:     Effort: Pulmonary effort is normal. No respiratory distress.     Breath sounds: Normal breath sounds.  Abdominal:     Palpations: Abdomen is soft.     Tenderness: There is no abdominal tenderness. There is no guarding or rebound.  Musculoskeletal:        General: No swelling, tenderness, deformity, signs of injury or edema. Normal range of motion.     Cervical back: Neck supple.  Skin:    General: Skin is warm and dry.     Capillary Refill: Capillary refill takes less than 2 seconds.  Neurological:     General: No focal deficit present.     Mental Status: She is alert and oriented to person, place, and time.     Cranial Nerves: No cranial nerve deficit.     Sensory: No sensory deficit.     Motor: No weakness.     Coordination: Coordination normal.     Gait: Gait normal.  Psychiatric:        Mood and Affect: Mood and affect and mood normal.        Behavior: Behavior normal.      UC Treatments / Results  Labs (all labs ordered are listed, but only abnormal results are displayed) Labs Reviewed  SARS CORONAVIRUS 2 (TAT 6-24 HRS)    EKG   Radiology No results found.  Procedures Procedures (including critical care time)  Medications Ordered in UC Medications - No data to display  Initial Impression / Assessment and Plan / UC Course  I have reviewed the triage vital signs and the nursing notes.  Pertinent labs & imaging results that were available during my care of the patient were reviewed by me and considered in my medical decision making (see chart for  details).   Chest pain.  Viral illness.  Patient is well-appearing and her exam is reassuring.  EKG shows sinus rhythm, rate 80, no ST elevation, compared to previous from 2019.  Discussed with patient that she should go to the ED if she has acute chest pain, weakness, or other concerning symptoms.  PCR COVID pending.  Instructed patient to self quarantine until the test result is back.  Instructed her to schedule an appointment with her PCP this week.  She agrees to plan of care.   Final Clinical Impressions(s) / UC Diagnoses   Final diagnoses:  Chest pain, unspecified type  Viral illness     Discharge Instructions     Go to the emergency department if you have acute chest pain, weakness, or other concerning symptoms.    Schedule an appointment with your primary care provider this week.        ED Prescriptions    None     PDMP not reviewed this encounter.   Mickie Bail, NP 11/09/20 1257

## 2020-11-09 NOTE — Discharge Instructions (Addendum)
Go to the emergency department if you have acute chest pain, weakness, or other concerning symptoms.    Schedule an appointment with your primary care provider this week.

## 2020-11-09 NOTE — ED Triage Notes (Signed)
Patient presents to [facility] for assessment of left chest/shoulder pain starting after church yesterday.  Patient c/o dizziness, nausea, fatigue.  Denies SOB.  Patient does c/o back pain, denies neck or jaw pain.  Patient rates the pain as a 6/10.  Denies cough or fever

## 2020-11-09 NOTE — ED Notes (Signed)
Awaiting EKG machine

## 2020-11-25 ENCOUNTER — Ambulatory Visit: Payer: Self-pay | Admitting: Family Medicine

## 2020-12-14 ENCOUNTER — Other Ambulatory Visit: Payer: Self-pay | Admitting: Physician Assistant

## 2021-01-04 ENCOUNTER — Other Ambulatory Visit: Payer: Self-pay

## 2021-01-04 MED ORDER — ESOMEPRAZOLE MAGNESIUM 40 MG PO CPDR
DELAYED_RELEASE_CAPSULE | ORAL | 12 refills | Status: DC
  Filled 2021-01-04 – 2021-01-20 (×2): qty 30, 30d supply, fill #0

## 2021-01-08 ENCOUNTER — Ambulatory Visit: Payer: Self-pay

## 2021-01-11 ENCOUNTER — Other Ambulatory Visit: Payer: Self-pay

## 2021-01-20 ENCOUNTER — Other Ambulatory Visit: Payer: Self-pay

## 2021-01-21 ENCOUNTER — Other Ambulatory Visit: Payer: Self-pay

## 2021-01-27 ENCOUNTER — Other Ambulatory Visit: Payer: Self-pay

## 2021-01-27 ENCOUNTER — Encounter: Payer: Self-pay | Admitting: Physician Assistant

## 2021-01-27 ENCOUNTER — Ambulatory Visit: Payer: Self-pay | Attending: Physician Assistant | Admitting: Physician Assistant

## 2021-01-27 DIAGNOSIS — Z131 Encounter for screening for diabetes mellitus: Secondary | ICD-10-CM

## 2021-01-27 DIAGNOSIS — R309 Painful micturition, unspecified: Secondary | ICD-10-CM

## 2021-01-27 DIAGNOSIS — O039 Complete or unspecified spontaneous abortion without complication: Secondary | ICD-10-CM

## 2021-01-27 DIAGNOSIS — N898 Other specified noninflammatory disorders of vagina: Secondary | ICD-10-CM

## 2021-01-27 DIAGNOSIS — N921 Excessive and frequent menstruation with irregular cycle: Secondary | ICD-10-CM

## 2021-01-27 DIAGNOSIS — Z8742 Personal history of other diseases of the female genital tract: Secondary | ICD-10-CM

## 2021-01-27 DIAGNOSIS — N926 Irregular menstruation, unspecified: Secondary | ICD-10-CM

## 2021-01-27 NOTE — Progress Notes (Signed)
Virtual Visit via Telephone Note  I connected with [name] on 01/27/21 at 10:10 AM EDT by telephone and verified that I am speaking with the correct person using two identifiers.  Location: Patient: home Provider: [place] [facility] [place] then [place] with pacific interpreters   I discussed the limitations, risks, security and privacy concerns of performing an evaluation and management service by telephone and the availability of in person appointments. I also discussed with the patient that there may be a patient responsible charge related to this service. The patient expressed understanding and agreed to proceed.   History of Present Illness:  She had a miscarriage in 2021.  HCG returned to normal.  Some vaginal discharge.  Some pain with urination on and off for 2 months.  Periods irregular since miscarriage and heavy.  LMP 4/6-4/13/2022.  No using BC.  Also wants kidney function checked and to be screened for diabetes.      Observations/Objective:  NAD.  A&Ox3   Assessment and Plan: 1. Spontaneous abortion H/o last November with period changes since then- - Beta hCG quant (ref lab); Future - Ambulatory referral to Obstetrics / Gynecology  2. Vaginal discharge - Urine Culture; Future - Urine cytology ancillary only; Future  3. History of recurrent vaginal discharge - Ambulatory referral to Obstetrics / Gynecology  4. Irregular menses - Thyroid Panel With TSH; Future - Ambulatory referral to Obstetrics / Gynecology - CBC with Differential/Platelet; Future  5. Painful urination on and off since miscarriage - Urine Culture; Future - Urine cytology ancillary only; Future - Basic metabolic panel; Future  6. Screening for diabetes mellitus I have had a lengthy discussion and provided education about insulin resistance and the intake of too much sugar/refined carbohydrates.  I have advised the patient to work at a goal of eliminating sugary drinks, candy, desserts,  sweets, refined sugars, processed foods, and white carbohydrates.  The patient expresses understanding.   - Hemoglobin A1c; Future  7. Menorrhagia with irregular cycle - CBC with Differential/Platelet; Future -see gyn   Follow Up Instructions: See PCP in 4-6 months or prn   I discussed the assessment and treatment plan with the patient. The patient was provided an opportunity to ask questions and all were answered. The patient agreed with the plan and demonstrated an understanding of the instructions.   The patient was advised to call back or seek an in-person evaluation if the symptoms worsen or if the condition fails to improve as anticipated.  I provided 23 minutes of non-face-to-face time during this encounter.   Georgian Co, PA-C  Patient ID: [name], female   DOB: 07/09/1986, 35 y.o.   MRN: [id number]

## 2021-01-28 LAB — URINE CYTOLOGY ANCILLARY ONLY
Bacterial Vaginitis (gardnerella): NEGATIVE
Candida Glabrata: NEGATIVE
Candida Vaginitis: NEGATIVE
Chlamydia: NEGATIVE
Comment: NEGATIVE
Comment: NEGATIVE
Comment: NEGATIVE
Comment: NEGATIVE
Comment: NEGATIVE
Comment: NORMAL
Neisseria Gonorrhea: NEGATIVE
Trichomonas: NEGATIVE

## 2021-01-29 LAB — BASIC METABOLIC PANEL
BUN/Creatinine Ratio: 13 (ref 9–23)
BUN: 9 mg/dL (ref 6–20)
CO2: 19 mmol/L — ABNORMAL LOW (ref 20–29)
Calcium: 9.6 mg/dL (ref 8.7–10.2)
Chloride: 102 mmol/L (ref 96–106)
Creatinine, Ser: 0.69 mg/dL (ref 0.57–1.00)
Glucose: 88 mg/dL (ref 65–99)
Potassium: 4.1 mmol/L (ref 3.5–5.2)
Sodium: 139 mmol/L (ref 134–144)
eGFR: 115 mL/min/{1.73_m2} (ref >59)

## 2021-01-29 LAB — CBC WITH DIFFERENTIAL/PLATELET
Basophils Absolute: 0 10*3/uL (ref 0.0–0.2)
Basos: 1 %
EOS (ABSOLUTE): 0.1 10*3/uL (ref 0.0–0.4)
Eos: 2 %
Hematocrit: 42.6 % (ref 34.0–46.6)
Hemoglobin: 14.5 g/dL (ref 11.1–15.9)
Immature Grans (Abs): 0 10*3/uL (ref 0.0–0.1)
Immature Granulocytes: 0 %
Lymphocytes Absolute: 2.3 10*3/uL (ref 0.7–3.1)
Lymphs: 29 %
MCH: 30.1 pg (ref 26.6–33.0)
MCHC: 34 g/dL (ref 31.5–35.7)
MCV: 88 fL (ref 79–97)
Monocytes Absolute: 0.7 10*3/uL (ref 0.1–0.9)
Monocytes: 9 %
Neutrophils Absolute: 4.7 10*3/uL (ref 1.4–7.0)
Neutrophils: 59 %
Platelets: 424 10*3/uL (ref 150–450)
RBC: 4.82 x10E6/uL (ref 3.77–5.28)
RDW: 13.2 % (ref 11.7–15.4)
WBC: 7.9 10*3/uL (ref 3.4–10.8)

## 2021-01-29 LAB — HEMOGLOBIN A1C
Est. average glucose Bld gHb Est-mCnc: 103 mg/dL
Hgb A1c MFr Bld: 5.2 % (ref 4.8–5.6)

## 2021-01-29 LAB — THYROID PANEL WITH TSH
Free Thyroxine Index: 1.8 (ref 1.2–4.9)
T3 Uptake Ratio: 23 % — ABNORMAL LOW (ref 24–39)
T4, Total: 7.9 ug/dL (ref 4.5–12.0)
TSH: 5.79 u[IU]/mL — ABNORMAL HIGH (ref 0.450–4.500)

## 2021-01-29 LAB — BETA HCG QUANT (REF LAB): hCG Quant: <1 m[IU]/mL

## 2021-01-30 LAB — URINE CULTURE

## 2021-02-03 ENCOUNTER — Other Ambulatory Visit: Payer: Self-pay | Admitting: Physician Assistant

## 2021-02-03 ENCOUNTER — Other Ambulatory Visit: Payer: Self-pay

## 2021-02-03 ENCOUNTER — Telehealth: Payer: Self-pay | Admitting: Family Medicine

## 2021-02-03 DIAGNOSIS — R7989 Other specified abnormal findings of blood chemistry: Secondary | ICD-10-CM

## 2021-02-03 MED ORDER — LEVOTHYROXINE SODIUM 25 MCG PO TABS
25.0000 ug | ORAL_TABLET | Freq: Every day | ORAL | 1 refills | Status: DC
  Filled 2021-02-03: qty 30, 30d supply, fill #0
  Filled 2021-03-08: qty 30, 30d supply, fill #1
  Filled 2021-04-20 – 2021-04-27 (×2): qty 30, 30d supply, fill #2
  Filled 2021-05-31: qty 30, 30d supply, fill #3

## 2021-02-03 NOTE — Telephone Encounter (Signed)
Pt wants to know lab results and states she has not heard anything from clinical team. Pt received call from pharmacy that this med is ready for pick up  levothyroxine (SYNTHROID) 25 MCG tablet [[id number]]. However she would like to know why she has to take this med. Pt wants to get pregnant & she is wondering would this med increase her chances of getting pregnant. Pt would like to know if she has a thyroid problem. Please follow up with pt regarding issue.

## 2021-02-03 NOTE — Telephone Encounter (Signed)
Pt was informed of lab results by Travia B.

## 2021-02-10 ENCOUNTER — Other Ambulatory Visit: Payer: Self-pay

## 2021-02-10 ENCOUNTER — Ambulatory Visit: Payer: Self-pay | Attending: Family Medicine

## 2021-02-19 ENCOUNTER — Encounter (HOSPITAL_COMMUNITY): Payer: Self-pay

## 2021-02-19 ENCOUNTER — Ambulatory Visit (HOSPITAL_COMMUNITY)
Admission: EM | Admit: 2021-02-19 | Discharge: 2021-02-19 | Disposition: A | Discharge status: Home / Self Care | Payer: Self-pay | Attending: Internal Medicine | Admitting: Internal Medicine

## 2021-02-19 ENCOUNTER — Other Ambulatory Visit: Payer: Self-pay

## 2021-02-19 DIAGNOSIS — M940 Chondrocostal junction syndrome [Tietze]: Secondary | ICD-10-CM

## 2021-02-19 MED ORDER — IBUPROFEN 600 MG PO TABS
600.0000 mg | ORAL_TABLET | Freq: Four times a day (QID) | ORAL | 0 refills | Status: DC | PRN
Start: 2021-02-19 — End: 2021-03-15
  Filled 2021-02-19 (×2): qty 30, 8d supply, fill #0

## 2021-02-19 NOTE — ED Provider Notes (Signed)
[facility]    CSN: [id number] Arrival date & time: 02/19/21  8250      History   Chief Complaint Chief Complaint  Patient presents with  . Chest Pain  . Arm Pain    HPI [name] is a 35 y.o. female comes to the urgent care with 1 week history of chest pain, left shoulder and left arm pain.  Patient's symptoms started insidiously and has been increasing symptoms.  She is concerned that it may be her heart.  Pain is sharp in nature.  It is associated with movement.  No known relieving factors.  Patient denies any heavy lifting, falls or trauma.  She denies any palpitations.  The shoulder pain is associated with neck pain and symptoms radiating to the left.  No jaw pain.  No family history of cardiac disease at an early age.  HPI  Past Medical History:  Diagnosis Date  . Bulimia   . Bulimia nervosa   . Eating disorder   . GERD (gastroesophageal reflux disease)     Patient Active Problem List   Diagnosis Date Noted  . Back pain affecting pregnancy in first trimester 07/25/2020  . Abdominal pain during pregnancy in first trimester 07/25/2020  . Eczema 07/10/2017  . Pregnancy 04/12/2014  . Atypical chest pain 05/15/2013  . Left arm pain 05/15/2013  . Starvation ketoacidosis 04/12/2013  . Syncope 04/12/2013  . Hypokalemia 04/12/2013  . Protein-calorie malnutrition, severe (HCC) 04/12/2013  . Prolonged Q-T interval on ECG 04/12/2013  . Depression 04/16/2012  . Underweight 03/20/2012  . Bulimia nervosa 03/20/2012  . Amenorrhea, secondary 03/20/2012    Past Surgical History:  Procedure Laterality Date  . COLONOSCOPY WITH PROPOFOL N/A 03/18/2016   Procedure: COLONOSCOPY WITH PROPOFOL;  Surgeon: Vida Rigger, MD;  Location: [facility];  Service: Endoscopy;  Laterality: N/A;  . ESOPHAGOGASTRODUODENOSCOPY (EGD) WITH PROPOFOL N/A 09/22/2017   Procedure: ESOPHAGOGASTRODUODENOSCOPY (EGD) WITH PROPOFOL;  Surgeon: Vida Rigger, MD;  Location: [facility];   Service: Endoscopy;  Laterality: N/A;  . NO PAST SURGERIES      OB History    Gravida  4   Para  3   Term  3   Preterm  0   AB  0   Living  3     SAB  0   IAB  0   Ectopic  0   Multiple      Live Births  3            Home Medications    Prior to Admission medications   Medication Sig Start Date End Date Taking? Authorizing Provider  ibuprofen (ADVIL) 600 MG tablet Take 1 tablet (600 mg total) by mouth every 6 (six) hours as needed. 02/19/21  Yes Iszabella Hebenstreit, Britta Mccreedy, MD  dicyclomine (BENTYL) 10 MG capsule TAKE 1 CAPSULE BY MOUTH THREE TIMES A DAY AS NEEDED FOR ABDOMINAL PAIN 12/14/20 12/14/21  Celso Amy, PA-C  levothyroxine (SYNTHROID) 25 MCG tablet Take 1 tablet (25 mcg total) by mouth daily. 02/03/21   Anders Simmonds, PA-C  pantoprazole (PROTONIX) 40 MG tablet TAKE 1 TABLET BY MOUTH TWICE DAILY. 06/11/20 06/11/21  Vida Rigger, MD  cetirizine (ZYRTEC) 10 MG tablet Take 1 tablet (10 mg total) by mouth daily. Patient not taking: No sig reported 01/08/20 02/19/21  Anders Simmonds, PA-C  esomeprazole (NEXIUM) 40 MG capsule take 1 capsule by mouth once daily 01/04/21 02/19/21    fluticasone (FLONASE) 50 MCG/ACT nasal spray Place 2 sprays into  both nostrils daily. Patient not taking: No sig reported 01/08/20 02/19/21  Anders Simmonds, PA-C  linaclotide [facility]) 290 MCG CAPS capsule TAKE 1 CAPSULE ORALLY ONCE A DAY WITH FIRST MEAL 30 DAYS 07/16/20 02/19/21  Vida Rigger, MD  promethazine (PHENERGAN) 25 MG tablet Take 1 tablet (25 mg total) by mouth every 6 (six) hours as needed for nausea or vomiting. 08/01/20 02/19/21  Rolm Bookbinder, CNM    Family History Family History  Problem Relation Age of Onset  . Diabetes Father   . Hearing loss Neg Hx   . Cancer Neg Hx   . Heart failure Neg Hx     Social History Social History   Tobacco Use  . Smoking status: Never Smoker  . Smokeless tobacco: Never Used  Vaping Use  . Vaping Use: Never used  Substance Use Topics  .  Alcohol use: No  . Drug use: No     Allergies   Patient has no known allergies.   Review of Systems Review of Systems  HENT: Negative.   Eyes: Negative.   Respiratory: Negative.   Cardiovascular: Positive for chest pain.  Gastrointestinal: Negative.   Genitourinary: Negative.      Physical Exam Triage Vital Signs ED Triage Vitals  Enc Vitals Group     BP 02/19/21 0939 117/76     Pulse Rate 02/19/21 0939 80     Resp 02/19/21 0939 16     Temp 02/19/21 0939 98 F (36.7 C)     Temp Source 02/19/21 0939 Oral     SpO2 02/19/21 0939 99 %     Weight --      Height --      Head Circumference --      Peak Flow --      Pain Score 02/19/21 0932 6     Pain Loc --      Pain Edu? --      Excl. in GC? --    No data found.  Updated Vital Signs BP 117/76 (BP Location: Right Arm)   Pulse 80   Temp 98 F (36.7 C) (Oral)   Resp 16   LMP 02/06/2021 (Exact Date)   SpO2 99%   Breastfeeding No   Visual Acuity Right Eye Distance:   Left Eye Distance:   Bilateral Distance:    Right Eye Near:   Left Eye Near:    Bilateral Near:     Physical Exam Vitals and nursing note reviewed.  Pulmonary:     Effort: Pulmonary effort is normal.  Chest:     Comments: Chest wall tenderness mainly on the left side.  No masses in the breast on palpation.  Chaperone was present at the time of the breast exam. Abdominal:     General: Bowel sounds are normal.  Neurological:     Mental Status: She is alert.      UC Treatments / Results  Labs (all labs ordered are listed, but only abnormal results are displayed) Labs Reviewed - No data to display  EKG   Radiology No results found.  Procedures Procedures (including critical care time)  Medications Ordered in UC Medications - No data to display  Initial Impression / Assessment and Plan / UC Course  I have reviewed the triage vital signs and the nursing notes.  Pertinent labs & imaging results that were available during my  care of the patient were reviewed by me and considered in my medical decision making (see chart for details).  1.  Costochondritis: Ibuprofen as needed for pain Gentle range of motion exercises Heating pad will help alleviate pain Patient is advised to return to urgent care if symptoms worsen. Patient was reassured that the symptoms is unlikely of cardiac origin.  Final Clinical Impressions(s) / UC Diagnoses   Final diagnoses:  Costochondritis     Discharge Instructions     Ibuprofen as needed for pain Gentle range of motion exercises Return to urgent care if symptoms worsen   ED Prescriptions    Medication Sig Dispense Auth. Provider   ibuprofen (ADVIL) 600 MG tablet Take 1 tablet (600 mg total) by mouth every 6 (six) hours as needed. 30 tablet Wandalene Abrams, Britta Mccreedy, MD     PDMP not reviewed this encounter.   Merrilee Jansky, MD 02/19/21 [phone]

## 2021-02-19 NOTE — ED Triage Notes (Signed)
Pt reports tabbing chest pain x 1 week; left arm pain x 1 day. Chest pain do not change with activity or rest. Denies vision changes, headache, nausea, vomiting, abdominal pain, diarrhea, cough.

## 2021-02-19 NOTE — Discharge Instructions (Signed)
Ibuprofen as needed for pain Gentle range of motion exercises Return to urgent care if symptoms worsen

## 2021-03-02 ENCOUNTER — Ambulatory Visit: Payer: Self-pay | Admitting: Obstetrics and Gynecology

## 2021-03-08 ENCOUNTER — Other Ambulatory Visit: Payer: Self-pay

## 2021-03-10 ENCOUNTER — Ambulatory Visit: Payer: Self-pay | Admitting: Physician Assistant

## 2021-03-10 ENCOUNTER — Ambulatory Visit: Payer: Self-pay | Admitting: Critical Care Medicine

## 2021-03-10 ENCOUNTER — Other Ambulatory Visit: Payer: Self-pay

## 2021-03-10 VITALS — BP 118/79 | HR 88 | Temp 98.2°F | Resp 18 | Ht 63.0 in | Wt 133.0 lb

## 2021-03-10 DIAGNOSIS — R002 Palpitations: Secondary | ICD-10-CM | POA: Insufficient documentation

## 2021-03-10 DIAGNOSIS — E039 Hypothyroidism, unspecified: Secondary | ICD-10-CM | POA: Insufficient documentation

## 2021-03-10 DIAGNOSIS — R0789 Other chest pain: Secondary | ICD-10-CM

## 2021-03-10 DIAGNOSIS — A048 Other specified bacterial intestinal infections: Secondary | ICD-10-CM

## 2021-03-10 DIAGNOSIS — K219 Gastro-esophageal reflux disease without esophagitis: Secondary | ICD-10-CM | POA: Insufficient documentation

## 2021-03-10 MED ORDER — CYCLOBENZAPRINE HCL 10 MG PO TABS
10.0000 mg | ORAL_TABLET | Freq: Three times a day (TID) | ORAL | 0 refills | Status: DC | PRN
  Filled 2021-03-10: qty 30, 10d supply, fill #0

## 2021-03-10 NOTE — Progress Notes (Signed)
Established Patient Office Visit  Subjective:  Patient ID: [name], female    DOB: 10/27/1985  Age: 35 y.o. MRN: [id number]  CC:  Chief Complaint  Patient presents with  . Chest Pain    HPI [name] reports that she was seen in urgent care on Feb 19, 2021 with same complaint.  Note from that visit.   HPI [name] is a 35 y.o. female comes to the urgent care with 1 week history of chest pain, left shoulder and left arm pain.  Patient's symptoms started insidiously and has been increasing symptoms.  She is concerned that it may be her heart.  Pain is sharp in nature.  It is associated with movement.  No known relieving factors.  Patient denies any heavy lifting, falls or trauma.  She denies any palpitations.  The shoulder pain is associated with neck pain and symptoms radiating to the left.  No jaw pain.  No family history of cardiac disease at an early age.  Plan:  1.  Costochondritis: Ibuprofen as needed for pain Gentle range of motion exercises Heating pad will help alleviate pain Patient is advised to return to urgent care if symptoms worsen. Patient was reassured that the symptoms is unlikely of cardiac origin  ECG WNL    States today that she did try the ibuprofen 600 without relief.  States that she did not try a heating pad.   States that she continues to have the same pain, states that when she is exercising or having intercourse with her husband she notices that her heart starts beating fast and she "sees little lights".  States that she tries to relax and breathe with some relief. States that this has been occurring for the past 4 weeks  Was last seen at [facility] by Freeman Caldron on 01/27/21  Had labs completed had thyroid function abn; patient was started on synthroid.  States that she she did start taking the synthroid about 4 weeks ago  States that she drinks a lot of water; 4-5 bottles a day; does feel like she is getting good  nutrition  Is taking nexium everyday , will have heartburn and acid reflux if she does not take it .  Sleep is good.  States that she feels her stress level is normal.  Due to language barrier, an interpreter was present during the history-taking and subsequent discussion (and for part of the physical exam) with this patient.   Past Medical History:  Diagnosis Date  . Bulimia   . Bulimia nervosa   . Eating disorder   . GERD (gastroesophageal reflux disease)     Past Surgical History:  Procedure Laterality Date  . COLONOSCOPY WITH PROPOFOL N/A 03/18/2016   Procedure: COLONOSCOPY WITH PROPOFOL;  Surgeon: Clarene Essex, MD;  Location: [facility];  Service: Endoscopy;  Laterality: N/A;  . ESOPHAGOGASTRODUODENOSCOPY (EGD) WITH PROPOFOL N/A 09/22/2017   Procedure: ESOPHAGOGASTRODUODENOSCOPY (EGD) WITH PROPOFOL;  Surgeon: Clarene Essex, MD;  Location: [place];  Service: Endoscopy;  Laterality: N/A;  . NO PAST SURGERIES      Family History  Problem Relation Age of Onset  . Diabetes Father   . Hearing loss Neg Hx   . Cancer Neg Hx   . Heart failure Neg Hx     Social History   Socioeconomic History  . Marital status: Married    Spouse name: Not on file  . Number of children: Not on file  . Years of education: Not on  file  . Highest education level: Not on file  Occupational History  . Not on file  Tobacco Use  . Smoking status: Never Smoker  . Smokeless tobacco: Never Used  Vaping Use  . Vaping Use: Never used  Substance and Sexual Activity  . Alcohol use: No  . Drug use: No  . Sexual activity: Yes    Birth control/protection: None  Other Topics Concern  . Not on file  Social History Narrative   ** Merged History Encounter **       Lives in [place] since 2005 with husband and 2 children (2011, 2008). Moved from [place] in 2005. Spanish speaking.    Social Determinants of Health   Financial Resource Strain: Not on file  Food Insecurity: Not on file   Transportation Needs: Not on file  Physical Activity: Not on file  Stress: Not on file  Social Connections: Not on file  Intimate Partner Violence: Not on file    Outpatient Medications Prior to Visit  Medication Sig Dispense Refill  . dicyclomine (BENTYL) 10 MG capsule TAKE 1 CAPSULE BY MOUTH THREE TIMES A DAY AS NEEDED FOR ABDOMINAL PAIN 90 capsule 5  . esomeprazole (NEXIUM) 40 MG capsule 1 capsule    . ibuprofen (ADVIL) 600 MG tablet Take 1 tablet (600 mg total) by mouth every 6 (six) hours as needed. 30 tablet 0  . levothyroxine (SYNTHROID) 25 MCG tablet Take 1 tablet (25 mcg total) by mouth daily. 90 tablet 1  . pantoprazole (PROTONIX) 40 MG tablet TAKE 1 TABLET BY MOUTH TWICE DAILY. 60 tablet 12   No facility-administered medications prior to visit.    No Known Allergies  ROS Review of Systems  Constitutional: Negative for chills and fever.  HENT: Negative.   Eyes: Negative.   Respiratory: Negative for chest tightness, shortness of breath and wheezing.   Cardiovascular: Positive for chest pain and palpitations. Negative for leg swelling.  Gastrointestinal: Negative for abdominal pain, nausea and vomiting.  Endocrine: Negative.   Genitourinary: Negative.   Musculoskeletal: Negative.   Skin: Negative.   Allergic/Immunologic: Negative.   Neurological: Negative for dizziness, light-headedness and headaches.  Hematological: Negative.   Psychiatric/Behavioral: Negative for dysphoric mood, self-injury, sleep disturbance and suicidal ideas. The patient is not nervous/anxious.       Objective:    Physical Exam Vitals and nursing note reviewed.  Constitutional:      General: She is not in acute distress.    Appearance: Normal appearance. She is well-developed.  HENT:     Head: Normocephalic and atraumatic.     Right Ear: External ear normal.     Left Ear: External ear normal.     Nose: Nose normal.     Mouth/Throat:     Mouth: Mucous membranes are moist.      Pharynx: Oropharynx is clear.  Eyes:     Extraocular Movements: Extraocular movements intact.     Conjunctiva/sclera: Conjunctivae normal.     Pupils: Pupils are equal, round, and reactive to light.  Cardiovascular:     Rate and Rhythm: Normal rate and regular rhythm.     Pulses: Normal pulses.     Heart sounds: Normal heart sounds.  Pulmonary:     Effort: Pulmonary effort is normal.     Breath sounds: Normal breath sounds.  Chest:     Chest wall: No tenderness.  Musculoskeletal:        General: Normal range of motion.     Cervical back: Normal range  of motion and neck supple.  Skin:    General: Skin is warm and dry.  Neurological:     General: No focal deficit present.     Mental Status: She is alert and oriented to person, place, and time.  Psychiatric:        Mood and Affect: Mood normal.        Behavior: Behavior normal.        Thought Content: Thought content normal.        Judgment: Judgment normal.     BP 118/79 (BP Location: Left Arm, Patient Position: Sitting, Cuff Size: Normal)   Pulse 88   Temp 98.2 F (36.8 C) (Oral)   Resp 18   Ht 5' 3"  (1.6 m)   Wt 133 lb (60.3 kg)   LMP 06/01/2020   SpO2 100%   BMI 23.56 kg/m  Wt Readings from Last 3 Encounters:  03/10/21 133 lb (60.3 kg)  09/02/20 135 lb 9.6 oz (61.5 kg)  08/06/20 129 lb (58.5 kg)     Health Maintenance Due  Topic Date Due  . Hepatitis C Screening  Never done  . PAP SMEAR-Modifier  Never done    There are no preventive care reminders to display for this patient.  Lab Results  Component Value Date   TSH 5.790 (H) 01/28/2021   Lab Results  Component Value Date   WBC 7.9 01/28/2021   HGB 14.5 01/28/2021   HCT 42.6 01/28/2021   MCV 88 01/28/2021   PLT 424 01/28/2021   Lab Results  Component Value Date   NA 139 01/28/2021   K 4.1 01/28/2021   CO2 19 (L) 01/28/2021   GLUCOSE 88 01/28/2021   BUN 9 01/28/2021   CREATININE 0.69 01/28/2021   BILITOT 0.2 08/06/2020   ALKPHOS 92  08/06/2020   AST 20 08/06/2020   ALT 18 08/06/2020   PROT 7.2 08/06/2020   ALBUMIN 4.9 (H) 08/06/2020   CALCIUM 9.6 01/28/2021   ANIONGAP 9 04/14/2018   EGFR 115 01/28/2021   No results found for: CHOL No results found for: HDL No results found for: LDLCALC No results found for: TRIG No results found for: CHOLHDL Lab Results  Component Value Date   HGBA1C 5.2 01/28/2021      Assessment & Plan:   Problem List Items Addressed This Visit      Other   Atypical chest pain   Relevant Medications   cyclobenzaprine (FLEXERIL) 10 MG tablet    Other Visit Diagnoses    Hypothyroidism, unspecified type    -  Primary   Relevant Orders   Thyroid Panel With TSH   Gastroesophageal reflux disease without esophagitis       Relevant Medications   esomeprazole (NEXIUM) 40 MG capsule   Other Relevant Orders   H Pylori, IGM, IGG, IGA AB   Palpitations          Meds ordered this encounter  Medications  . cyclobenzaprine (FLEXERIL) 10 MG tablet    Sig: Take 1 tablet (10 mg total) by mouth 3 (three) times daily as needed for muscle spasms.    Dispense:  30 tablet    Refill:  0    Order Specific Question:   Supervising Provider    Answer:   Joya Gaskins, PATRICK E [[id number]]  1. Atypical chest pain Reassurance given, trial Flexeril. - cyclobenzaprine (FLEXERIL) 10 MG tablet; Take 1 tablet (10 mg total) by mouth 3 (three) times daily as needed for muscle spasms.  Dispense: 30  tablet; Refill: 0  2. Hypothyroidism, unspecified type Will repeat thyroid panel to make sure patient has not become hyperthyroid. - Thyroid Panel With TSH  3. Gastroesophageal reflux disease without esophagitis Continue nexium - H Pylori, IGM, IGG, IGA AB  4. Palpitations Patient education given, red flags given for prompt reevaluation.   I have reviewed the patient's medical history (PMH, PSH, Social History, Family History, Medications, and allergies) , and have been updated if relevant. I spent 31 minutes  reviewing chart and  face to face time with patient.     Follow-up: No follow-ups on file.    Loraine Grip Mayers, PA-C

## 2021-03-10 NOTE — Progress Notes (Signed)
Patient presents with chronic abdominal and left shoulder pain. Patient has taken medication but shares it does not provide relief. Patient has eaten today Patient reports pain at a is scaled at a 2 currently.

## 2021-03-10 NOTE — Patient Instructions (Signed)
To help you with your chest pain, I did send a muscle relaxer to the pharmacy.  I do encourage you to try using a heating pad to see if that also offers you relief.  We will call you with your lab results as soon as they are available.  I hope that you feel better soon.  Roney Jaffe, PA-C Physician Assistant [facility] [email or web address]    Palpitaciones Palpitations Las palpitaciones son sensaciones de que su latido cardaco es irregular o ms rpido de lo normal. Se siente como un aleteo o que falta un latido. Generalmente no es un problema grave. Las causas de las palpitaciones pueden ser diversas, entre ellas, el estrs, y el consumo de cigarrillos, cafena, alcohol y determinados medicamentos o drogas. La mayora de las causas de las palpitaciones no son graves. Sin embargo, algunas palpitaciones pueden ser un signo de un problema grave. Tal vez necesite otros estudios para descartar algn problema mdico grave. Siga estas indicaciones en su casa: Est atento a cualquier cambio en su afeccin. Tome estas medidas para ayudar a Chief Operating Officer sus sntomas: Chemical engineer comer y beber  Mirant alimentos y las bebidas que pueden provocar palpitaciones. Estos pueden incluir lo siguiente: ? Bebidas que contengan cafena como el caf, el t, los refrescos, las pastillas para Geophysical data processor y las bebidas energizantes. ? Chocolate. ? Alcohol. Estilo de vida  [name] para reducir los niveles de estrs y la ansiedad. Algunas cosas que pueden ayudarlo a relajarse son: ? Yoga. ? Actividades para el cuerpo y la mente, como respiracin profunda, meditacin o usar palabras e imgenes para crear pensamientos positivos (visualizacin guiada). ? Actividad fsica como natacin, trote o caminatas. Informe a su mdico si las palpitaciones aumentan con la [employer]. Si la [employer] de pecho o falta de aire, no contine la actividad hasta  que su mdico lo examine. ? Biorretroalimentacin. Este es un mtodo que le ensea a usar la mente para Chief Operating Officer cosas del cuerpo, como el latido cardaco.  No use drogas, entre ellas la cocana o el xtasis. No use marihuana.  Descanse y duerma lo suficiente. Mantenga un horario habitual para acostarse. Instrucciones generales  [employer] de venta libre y los recetados solamente como se lo haya indicado el mdico.  No consuma ningn producto que contenga nicotina o tabaco, como cigarrillos y Administrator, Civil Service. Si necesita ayuda para dejar de fumar, consulte al mdico.  Concurra a todas las visitas de seguimiento como se lo haya indicado el mdico. Esto es importante. Estas pueden incluir visitas para realizarle ms estudios si las palpitaciones no desaparecen o empeoran.      Comunquese con un mdico si:  Contina con latidos cardacos rpidos o irregulares despus de 24 horas.  Observa que las palpitaciones ocurren con ms frecuencia. Solicite ayuda inmediatamente si:  Dance movement psychotherapist o le falta el aire.  Tiene un dolor de cabeza intenso.  Se siente mareado o se desmaya. Resumen  Las palpitaciones son sensaciones de que su latido cardaco es irregular o ms rpido de lo normal. Se siente como un aleteo o que falta un latido.  Las causas de las palpitaciones pueden ser diversas, entre ellas, el estrs y el consumo de cigarrillos, cafena, alcohol y determinadas drogas.  Si bien la [employer] de las causas de las palpitaciones no son graves, algunas pueden ser un signo de un problema mdico grave.  Obtenga ayuda de inmediato si se desmaya o tiene dolor de [place],  falta de aire, dolor de cabeza intenso o mareos. Esta informacin no tiene Theme park manager el consejo del mdico. Asegrese de hacerle al mdico cualquier pregunta que tenga. Document Revised: 01/01/2018 Document Reviewed: 01/01/2018 Elsevier Patient Education  2021 [employer].

## 2021-03-13 LAB — THYROID PANEL WITH TSH
Free Thyroxine Index: 2 (ref 1.2–4.9)
T3 Uptake Ratio: 24 % (ref 24–39)
T4, Total: 8.5 ug/dL (ref 4.5–12.0)
TSH: 2.21 u[IU]/mL (ref 0.450–4.500)

## 2021-03-13 LAB — H PYLORI, IGM, IGG, IGA AB
H pylori, IgM Abs: <9 units (ref 0.0–8.9)
H. pylori, IgA Abs: 11.4 units — ABNORMAL HIGH (ref 0.0–8.9)
H. pylori, IgG AbS: 0.84 Index Value — ABNORMAL HIGH (ref 0.00–0.79)

## 2021-03-15 ENCOUNTER — Ambulatory Visit (INDEPENDENT_AMBULATORY_CARE_PROVIDER_SITE_OTHER): Payer: Self-pay | Admitting: Obstetrics and Gynecology

## 2021-03-15 ENCOUNTER — Other Ambulatory Visit: Payer: Self-pay

## 2021-03-15 ENCOUNTER — Other Ambulatory Visit (HOSPITAL_COMMUNITY)
Admission: RE | Admit: 2021-03-15 | Discharge: 2021-03-15 | Disposition: A | Discharge status: Home / Self Care | Payer: Self-pay | Source: Ambulatory Visit | Attending: Obstetrics and Gynecology | Admitting: Obstetrics and Gynecology

## 2021-03-15 DIAGNOSIS — N946 Dysmenorrhea, unspecified: Secondary | ICD-10-CM | POA: Insufficient documentation

## 2021-03-15 DIAGNOSIS — N898 Other specified noninflammatory disorders of vagina: Secondary | ICD-10-CM | POA: Insufficient documentation

## 2021-03-15 DIAGNOSIS — B379 Candidiasis, unspecified: Secondary | ICD-10-CM | POA: Insufficient documentation

## 2021-03-15 DIAGNOSIS — A048 Other specified bacterial intestinal infections: Secondary | ICD-10-CM | POA: Insufficient documentation

## 2021-03-15 DIAGNOSIS — N644 Mastodynia: Secondary | ICD-10-CM | POA: Insufficient documentation

## 2021-03-15 MED ORDER — PANTOPRAZOLE SODIUM 40 MG PO TBEC
40.0000 mg | DELAYED_RELEASE_TABLET | Freq: Every day | ORAL | 3 refills | Status: DC
  Filled 2021-03-15: qty 30, 30d supply, fill #0
  Filled 2021-04-26: qty 30, 30d supply, fill #1
  Filled 2021-05-31: qty 30, 30d supply, fill #2

## 2021-03-15 MED ORDER — PYLERA 140-125-125 MG PO CAPS
3.0000 | ORAL_CAPSULE | Freq: Three times a day (TID) | ORAL | 0 refills | Status: DC
  Filled 2021-03-15: qty 120, 10d supply, fill #0

## 2021-03-15 MED ORDER — IBUPROFEN 600 MG PO TABS
600.0000 mg | ORAL_TABLET | Freq: Four times a day (QID) | ORAL | 2 refills | Status: DC | PRN
  Filled 2021-03-15: qty 30, 8d supply, fill #0

## 2021-03-15 NOTE — Progress Notes (Signed)
  CC: irregular periods, vaginal discharge Subjective:    Patient ID: [name], female    DOB: August 17, 1986, 35 y.o.   MRN: [id number]  Gynecologic Exam The patient's primary symptoms include vaginal discharge.  35 yo G4P3 seen at [facility].  Pt had multiple vague complaints.  Pt noted miscarriage in November 2021.  Pt states since then her menses have been different.  They are still monthly, but they are "ten days later than they used to be."  Menses last 3-4 days with occasional clots and she has abdominal pain during her cycle.  Pt has concerns about frequent yeast infections.  These are usually addressed at the health department.  She is not sure if she has one currently.  Pt also left sided breast pain which was evaluated 07/2018 with a mammogram.  Fibrocystic breast were noted at that time with no other lesions noted. Pt gets her papas form the health department.  Pt concerned about future miscarriages.  Discussed increased risk for SAB with increasing age.  If no pregnancy in 6 months can return for discussion.   Review of Systems  Genitourinary:  Positive for menstrual problem and vaginal discharge.  Musculoskeletal:        Left sided breast pain      Objective:   Physical Exam Constitutional:      Appearance: Normal appearance. She is normal weight.  HENT:     Head: Normocephalic and atraumatic.  Chest:  Breasts:    Right: Normal. No swelling, mass, skin change or tenderness.     Left: No swelling, mass or tenderness.  Genitourinary:    Comments: SSE: normal vagina, physiologic discharge, cervix WNL Neurological:     Mental Status: She is alert.   Vitals:   03/15/21 0913  BP: 116/77  Pulse: 97         Assessment & Plan:   1. Dysmenorrhea Pt advised to take NSAID first 48 hour of menses for pain control  - ibuprofen (ADVIL) 600 MG tablet; Take 1 tablet (600 mg total) by mouth every 6 (six) hours as needed for headache, mild pain, moderate pain or  cramping.  Dispense: 30 tablet; Refill: 2  2. Yeast infection Hx of recurrent yeast infection, nothing apparent today  3. Vaginal discharge Will ck swab for infx - Cervicovaginal ancillary only( [facility])  4. Breast pain in female Likely from fibrocystic breasts, information given advised diet changes ie decreased caffeine  F/u prn or in 6 months if still having difficulty with fertility  Warden Fillers, MD Faculty Attending, [facility]

## 2021-03-15 NOTE — Addendum Note (Signed)
Addended by: Roney Jaffe on: 03/15/2021 11:19 AM   Modules accepted: Orders

## 2021-03-16 ENCOUNTER — Other Ambulatory Visit: Payer: Self-pay

## 2021-03-16 ENCOUNTER — Telehealth: Payer: Self-pay | Admitting: General Practice

## 2021-03-16 LAB — CERVICOVAGINAL ANCILLARY ONLY
Bacterial Vaginitis (gardnerella): NEGATIVE
Candida Glabrata: NEGATIVE
Candida Vaginitis: NEGATIVE
Chlamydia: NEGATIVE
Comment: NEGATIVE
Comment: NEGATIVE
Comment: NEGATIVE
Comment: NEGATIVE
Comment: NEGATIVE
Comment: NORMAL
Neisseria Gonorrhea: NEGATIVE
Trichomonas: NEGATIVE

## 2021-03-16 NOTE — Telephone Encounter (Signed)
-----   Message from Warden Fillers, MD sent at 03/16/2021 12:47 PM EDT ----- Vaginal swab completely negative, please inform patient

## 2021-03-16 NOTE — Telephone Encounter (Signed)
Called patient with [facility] for spanish interpretation & informed her of negative results. Patient verbalized understanding.

## 2021-03-17 ENCOUNTER — Telehealth: Payer: Self-pay | Admitting: *Deleted

## 2021-03-17 NOTE — Telephone Encounter (Signed)
Patient verified DOB Patient is aware of results via VM and verified with the nurse. Patient will FU in 2 weeks after completion.

## 2021-03-17 NOTE — Telephone Encounter (Signed)
-----   Message from Roney Jaffe, [place] sent at 03/15/2021 11:19 AM EDT ----- Please call patient and let her know that her thyroid function is within normal limits, she should continue to take her thyroid medication at this time.  She is positive for H. pylori and will need to begin treatment.She will take Protonix 40 mg once daily, and antibiotic pack (Pylera).  She will take these for 10 days.  She will not take Nexium at this time.  She will continue the protonix until after she follows up in the mobile unit  Prescription sent to her pharmacy.  Please have patient return to mobile unit in 2 weeks for follow-up

## 2021-03-17 NOTE — Telephone Encounter (Signed)
Medical Assistant used [employer] to contact patient.  Interpreter Name: Durwin Glaze #: [id number] Patient is aware of thyroid being normal and to continue current dosage. Patient is also aware of H pylori being positive and needing to stop Nexium and take Protonix and Pylera for the next 10 days. Patient advised to continue Protonix after antibiotic completion and return to [facility] in two weeks for FU.

## 2021-03-18 ENCOUNTER — Telehealth: Payer: Self-pay | Admitting: *Deleted

## 2021-03-18 ENCOUNTER — Other Ambulatory Visit: Payer: Self-pay

## 2021-03-18 NOTE — Telephone Encounter (Signed)
Patient verified DOB Patient called the office to have double clarity on medications and results. Patient understood and will follow up in 2 weeks after 10 day treatment.

## 2021-04-06 ENCOUNTER — Other Ambulatory Visit: Payer: Self-pay

## 2021-04-07 ENCOUNTER — Encounter (INDEPENDENT_AMBULATORY_CARE_PROVIDER_SITE_OTHER): Payer: Self-pay

## 2021-04-07 ENCOUNTER — Other Ambulatory Visit: Payer: Self-pay

## 2021-04-07 ENCOUNTER — Ambulatory Visit: Payer: Self-pay | Attending: Family Medicine

## 2021-04-07 DIAGNOSIS — R7989 Other specified abnormal findings of blood chemistry: Secondary | ICD-10-CM

## 2021-04-08 LAB — THYROID PANEL WITH TSH
Free Thyroxine Index: 2.5 (ref 1.2–4.9)
T3 Uptake Ratio: 27 % (ref 24–39)
T4, Total: 9.1 ug/dL (ref 4.5–12.0)
TSH: 2.21 u[IU]/mL (ref 0.450–4.500)

## 2021-04-20 ENCOUNTER — Other Ambulatory Visit: Payer: Self-pay

## 2021-04-23 ENCOUNTER — Telehealth: Payer: Self-pay

## 2021-04-23 NOTE — Telephone Encounter (Signed)
Pt was called and informed of lab results and to continue taking medication. 

## 2021-04-26 ENCOUNTER — Other Ambulatory Visit: Payer: Self-pay

## 2021-04-27 ENCOUNTER — Other Ambulatory Visit: Payer: Self-pay

## 2021-05-31 ENCOUNTER — Other Ambulatory Visit: Payer: Self-pay

## 2021-06-03 ENCOUNTER — Other Ambulatory Visit: Payer: Self-pay

## 2021-06-09 ENCOUNTER — Other Ambulatory Visit: Payer: Self-pay

## 2021-06-09 ENCOUNTER — Encounter: Payer: Self-pay | Admitting: Family Medicine

## 2021-06-09 ENCOUNTER — Ambulatory Visit: Payer: Self-pay | Attending: Family Medicine | Admitting: Family Medicine

## 2021-06-09 ENCOUNTER — Other Ambulatory Visit (HOSPITAL_COMMUNITY)
Admission: RE | Admit: 2021-06-09 | Discharge: 2021-06-09 | Disposition: A | Discharge status: Home / Self Care | Payer: Self-pay | Source: Ambulatory Visit | Attending: Family Medicine | Admitting: Family Medicine

## 2021-06-09 VITALS — BP 121/82 | HR 80 | Temp 98.8°F | Resp 16 | Wt 140.0 lb

## 2021-06-09 DIAGNOSIS — Z113 Encounter for screening for infections with a predominantly sexual mode of transmission: Secondary | ICD-10-CM

## 2021-06-09 DIAGNOSIS — Z23 Encounter for immunization: Secondary | ICD-10-CM

## 2021-06-09 DIAGNOSIS — Z124 Encounter for screening for malignant neoplasm of cervix: Secondary | ICD-10-CM | POA: Insufficient documentation

## 2021-06-09 DIAGNOSIS — Z0000 Encounter for general adult medical examination without abnormal findings: Secondary | ICD-10-CM

## 2021-06-09 DIAGNOSIS — K648 Other hemorrhoids: Secondary | ICD-10-CM

## 2021-06-09 DIAGNOSIS — E039 Hypothyroidism, unspecified: Secondary | ICD-10-CM

## 2021-06-09 MED ORDER — HYDROCORT-PRAMOXINE (PERIANAL) 2.5-1 % EX CREA
1.0000 "application " | TOPICAL_CREAM | Freq: Three times a day (TID) | CUTANEOUS | 1 refills | Status: DC
  Filled 2021-06-09: qty 30, 10d supply, fill #0

## 2021-06-09 MED ORDER — LEVOTHYROXINE SODIUM 25 MCG PO TABS
25.0000 ug | ORAL_TABLET | Freq: Every day | ORAL | 1 refills | Status: DC
  Filled 2021-06-09: qty 90, 90d supply, fill #0
  Filled 2021-06-30: qty 30, 30d supply, fill #0
  Filled 2021-08-03: qty 30, 30d supply, fill #1
  Filled 2021-09-14: qty 30, 30d supply, fill #2
  Filled 2021-10-18: qty 30, 30d supply, fill #3
  Filled 2021-10-18: qty 30, 30d supply, fill #0
  Filled 2021-11-29: qty 30, 30d supply, fill #1

## 2021-06-09 NOTE — Progress Notes (Signed)
Itching and burning in anal area and vaginal d/c x 1 week.   LMP 8/28   [name] [id number]

## 2021-06-09 NOTE — Progress Notes (Signed)
Subjective:  Patient ID: [name], female    DOB: 1985/11/28  Age: 35 y.o. MRN: [id number]  CC: No chief complaint on file.   HPI [name] is a 35 y.o. year old female with a history of hypothyroidism who presents for an annual physical exam.  Interval History: She complains of vaginal itching and burning in her anus and vaginal discharge which has no abnormal odor to it. She has a sense of incomplete voiding but no dysuria or frequency. She has had the same sexual partner and is heterosexual. She denies presence of constipation but does endorse being informed that she did have internal hemorrhoids. Compliant with levothyroxine for hypothyroidism. Past Medical History:  Diagnosis Date   Bulimia    Bulimia nervosa    Eating disorder    GERD (gastroesophageal reflux disease)     Past Surgical History:  Procedure Laterality Date   COLONOSCOPY WITH PROPOFOL N/A 03/18/2016   Procedure: COLONOSCOPY WITH PROPOFOL;  Surgeon: Vida Rigger, MD;  Location: [facility] ENDOSCOPY;  Service: Endoscopy;  Laterality: N/A;   ESOPHAGOGASTRODUODENOSCOPY (EGD) WITH PROPOFOL N/A 09/22/2017   Procedure: ESOPHAGOGASTRODUODENOSCOPY (EGD) WITH PROPOFOL;  Surgeon: Vida Rigger, MD;  Location: [facility] ENDOSCOPY;  Service: Endoscopy;  Laterality: N/A;   NO PAST SURGERIES      Family History  Problem Relation Age of Onset   Diabetes Father    Hearing loss Neg Hx    Cancer Neg Hx    Heart failure Neg Hx     No Known Allergies  Outpatient Medications Prior to Visit  Medication Sig Dispense Refill   ibuprofen (ADVIL) 600 MG tablet Take 1 tablet (600 mg total) by mouth every 6 (six) hours as needed for headache, mild pain, moderate pain or cramping. 30 tablet 2   pantoprazole (PROTONIX) 40 MG tablet Take 1 tablet (40 mg total) by mouth daily. 30 tablet 3   levothyroxine (SYNTHROID) 25 MCG tablet Take 1 tablet (25 mcg total) by mouth daily. 90 tablet 1   bismuth-metronidazole-tetracycline (PYLERA)  140-125-125 MG capsule Take 3 capsules by mouth 4 (four) times daily -  before meals and at bedtime for 10 days. 120 capsule 0   cyclobenzaprine (FLEXERIL) 10 MG tablet Take 1 tablet (10 mg total) by mouth 3 (three) times daily as needed for muscle spasms. 30 tablet 0   dicyclomine (BENTYL) 10 MG capsule TAKE 1 CAPSULE BY MOUTH THREE TIMES A DAY AS NEEDED FOR ABDOMINAL PAIN 90 capsule 5   No facility-administered medications prior to visit.     ROS Review of Systems  Constitutional:  Negative for activity change, appetite change and fatigue.  HENT:  Negative for congestion, sinus pressure and sore throat.   Eyes:  Negative for visual disturbance.  Respiratory:  Negative for cough, chest tightness, shortness of breath and wheezing.   Cardiovascular:  Negative for chest pain and palpitations.  Gastrointestinal:  Negative for abdominal distention, abdominal pain and constipation.  Endocrine: Negative for polydipsia.  Genitourinary:  Positive for vaginal discharge. Negative for dysuria and frequency.  Musculoskeletal:  Negative for arthralgias and back pain.  Skin:  Negative for rash.  Neurological:  Negative for tremors, light-headedness and numbness.  Hematological:  Does not bruise/bleed easily.  Psychiatric/Behavioral:  Negative for agitation and behavioral problems.    Objective:  BP 121/82   Pulse 80   Temp 98.8 F (37.1 C) (Oral)   Resp 16   Wt 140 lb (63.5 kg)   SpO2 100%   BMI 24.80 kg/m  BP/Weight 06/09/2021 03/15/2021 03/10/2021  Systolic BP 121 116 118  Diastolic BP 82 77 79  Wt. (Lbs) 140 133.8 133  BMI 24.8 23.7 23.56      Physical Exam Exam conducted with a chaperone present.  Constitutional:      General: She is not in acute distress.    Appearance: She is well-developed. She is not diaphoretic.  HENT:     Head: Normocephalic.     Right Ear: External ear normal.     Left Ear: External ear normal.     Nose: Nose normal.  Eyes:     Conjunctiva/sclera:  Conjunctivae normal.     Pupils: Pupils are equal, round, and reactive to light.  Neck:     Vascular: No JVD.  Cardiovascular:     Rate and Rhythm: Normal rate and regular rhythm.     Heart sounds: Normal heart sounds. No murmur heard.   No gallop.  Pulmonary:     Effort: Pulmonary effort is normal. No respiratory distress.     Breath sounds: Normal breath sounds. No wheezing or rales.  Chest:     Chest wall: No tenderness.  Breasts:    Right: Normal. No mass, nipple discharge or tenderness.     Left: Normal. No mass, nipple discharge or tenderness.  Abdominal:     General: Bowel sounds are normal. There is no distension.     Palpations: Abdomen is soft. There is no mass.     Tenderness: There is no abdominal tenderness.     Hernia: There is no hernia in the left inguinal area or right inguinal area.  Genitourinary:    General: Normal vulva.     Pubic Area: No rash.      Labia:        Right: No rash.        Left: No rash.      Vagina: Normal.     Cervix: Normal.     Uterus: Normal.      Adnexa: Right adnexa normal and left adnexa normal.       Right: No tenderness.         Left: No tenderness.    Musculoskeletal:        General: No tenderness. Normal range of motion.     Cervical back: Normal range of motion. No tenderness.     Right lower leg: No edema.     Left lower leg: No edema.  Lymphadenopathy:     Upper Body:     Right upper body: No supraclavicular or axillary adenopathy.     Left upper body: No supraclavicular or axillary adenopathy.  Skin:    General: Skin is warm and dry.  Neurological:     Mental Status: She is alert and oriented to person, place, and time.     Deep Tendon Reflexes: Reflexes are normal and symmetric.  Psychiatric:        Mood and Affect: Mood normal.    CMP Latest Ref Rng & Units 01/28/2021 08/06/2020 01/21/2019  Glucose 65 - 99 mg/dL 88 89 84  BUN 6 - 20 mg/dL 9 11 8   Creatinine 0.57 - 1.00 mg/dL 1.75 1.02  Sodium 134 - 144  mmol/L 139 139 135  Potassium 3.5 - 5.2 mmol/L 4.1 4.3 4.5  Chloride 96 - 106 mmol/L 102 102 99  CO2 20 - 29 mmol/L 19(L) 25 23  Calcium 8.7 - 10.2 mg/dL 9.6 9.6 9.6  Total Protein 6.0 - 8.5 g/dL - 7.2 -  Total Bilirubin 0.0 - 1.2 mg/dL - 0.2 -  Alkaline Phos 44 - 121 IU/L - 92 -  AST 0 - 40 IU/L - 20 -  ALT 0 - 32 IU/L - 18 -    Lipid Panel  No results found for: CHOL, TRIG, HDL, CHOLHDL, VLDL, LDLCALC, LDLDIRECT  CBC    Component Value Date/Time   WBC 7.9 01/28/2021 0953   WBC 7.2 08/01/2020 0803   RBC 4.82 01/28/2021 0953   RBC 4.40 08/01/2020 0803   HGB 14.5 01/28/2021 0953   HCT 42.6 01/28/2021 0953   PLT 424 01/28/2021 0953   MCV 88 01/28/2021 0953   MCH 30.1 01/28/2021 0953   MCH 29.8 08/01/2020 0803   MCHC 34.0 01/28/2021 0953   MCHC 33.1 08/01/2020 0803   RDW 13.2 01/28/2021 0953   LYMPHSABS 2.3 01/28/2021 0953   MONOABS 0.3 10/02/2017 1139   EOSABS 0.1 01/28/2021 0953   BASOSABS 0.0 01/28/2021 0953    Lab Results  Component Value Date   HGBA1C 5.2 01/28/2021    Assessment & Plan:  1. Annual physical exam Counseled on 150 minutes of exercise per week, healthy eating (including decreased daily intake of saturated fats, cholesterol, added sugars, sodium), STI prevention, routine healthcare maintenance.   2. Screening for cervical cancer - Cytology - PAP([place])  3. Screening for STD (sexually transmitted disease) - Cervicovaginal ancillary only  4. Need for influenza vaccination Flu shot administered  5. Internal hemorrhoid - hydrocortisone-pramoxine (ANALPRAM HC) 2.5-1 % rectal cream; Place 1 application rectally 3 (three) times daily.  Dispense: 30 g; Refill: 1  6. Acquired hypothyroidism Controlled - levothyroxine (SYNTHROID) 25 MCG tablet; Take 1 tablet (25 mcg total) by mouth daily.  Dispense: 90 tablet; Refill: 1   Meds ordered this encounter  Medications   levothyroxine (SYNTHROID) 25 MCG tablet    Sig: Take 1 tablet (25 mcg total)  by mouth daily.    Dispense:  90 tablet    Refill:  1     Follow-up: Return in about 6 months (around 12/07/2021) for Hypothyroidism.       [name], MD, FAAFP. [facility] [place], [place] [phone]   06/09/2021, 9:34 AM

## 2021-06-09 NOTE — Patient Instructions (Signed)
Health Maintenance, Female Adopting a healthy lifestyle and getting preventive care are important in promoting health and wellness. Ask your health care provider about: The right schedule for you to have regular tests and exams. Things you can do on your own to prevent diseases and keep yourself healthy. What should I know about diet, weight, and exercise? Eat a healthy diet  Eat a diet that includes plenty of vegetables, fruits, low-fat dairy products, and lean protein. Do not eat a lot of foods that are high in solid fats, added sugars, or sodium. Maintain a healthy weight Body mass index (BMI) is used to identify weight problems. It estimates body fat based on height and weight. Your health care provider can help determine your BMI and help you achieve or maintain a healthy weight. Get regular exercise Get regular exercise. This is one of the most important things you can do for your health. Most adults should: Exercise for at least 150 minutes each week. The exercise should increase your heart rate and make you sweat (moderate-intensity exercise). Do strengthening exercises at least twice a week. This is in addition to the moderate-intensity exercise. Spend less time sitting. Even light physical activity can be beneficial. Watch cholesterol and blood lipids Have your blood tested for lipids and cholesterol at 36 years of age, then have this test every 5 years. Have your cholesterol levels checked more often if: Your lipid or cholesterol levels are high. You are older than 36 years of age. You are at high risk for heart disease. What should I know about cancer screening? Depending on your health history and family history, you may need to have cancer screening at various ages. This may include screening for: Breast cancer. Cervical cancer. Colorectal cancer. Skin cancer. Lung cancer. What should I know about heart disease, diabetes, and high blood pressure? Blood pressure and heart  disease High blood pressure causes heart disease and increases the risk of stroke. This is more likely to develop in people who have high blood pressure readings, are of African descent, or are overweight. Have your blood pressure checked: Every 3-5 years if you are 18-39 years of age. Every year if you are 40 years old or older. Diabetes Have regular diabetes screenings. This checks your fasting blood sugar level. Have the screening done: Once every three years after age 40 if you are at a normal weight and have a low risk for diabetes. More often and at a younger age if you are overweight or have a high risk for diabetes. What should I know about preventing infection? Hepatitis B If you have a higher risk for hepatitis B, you should be screened for this virus. Talk with your health care provider to find out if you are at risk for hepatitis B infection. Hepatitis C Testing is recommended for: Everyone born from 1945 through 1965. Anyone with known risk factors for hepatitis C. Sexually transmitted infections (STIs) Get screened for STIs, including gonorrhea and chlamydia, if: You are sexually active and are younger than 36 years of age. You are older than 36 years of age and your health care provider tells you that you are at risk for this type of infection. Your sexual activity has changed since you were last screened, and you are at increased risk for chlamydia or gonorrhea. Ask your health care provider if you are at risk. Ask your health care provider about whether you are at high risk for HIV. Your health care provider may recommend a prescription medicine   to help prevent HIV infection. If you choose to take medicine to prevent HIV, you should first get tested for HIV. You should then be tested every 3 months for as long as you are taking the medicine. Pregnancy If you are about to stop having your period (premenopausal) and you may become pregnant, seek counseling before you get  pregnant. Take 400 to 800 micrograms (mcg) of folic acid every day if you become pregnant. Ask for birth control (contraception) if you want to prevent pregnancy. Osteoporosis and menopause Osteoporosis is a disease in which the bones lose minerals and strength with aging. This can result in bone fractures. If you are 65 years old or older, or if you are at risk for osteoporosis and fractures, ask your health care provider if you should: Be screened for bone loss. Take a calcium or vitamin D supplement to lower your risk of fractures. Be given hormone replacement therapy (HRT) to treat symptoms of menopause. Follow these instructions at home: Lifestyle Do not use any products that contain nicotine or tobacco, such as cigarettes, e-cigarettes, and chewing tobacco. If you need help quitting, ask your health care provider. Do not use street drugs. Do not share needles. Ask your health care provider for help if you need support or information about quitting drugs. Alcohol use Do not drink alcohol if: Your health care provider tells you not to drink. You are pregnant, may be pregnant, or are planning to become pregnant. If you drink alcohol: Limit how much you use to 0-1 drink a day. Limit intake if you are breastfeeding. Be aware of how much alcohol is in your drink. In the [place] one drink equals one 12 oz bottle of beer (355 mL), one 5 oz glass of wine (148 mL), or one 1 oz glass of hard liquor (44 mL). General instructions Schedule regular health, dental, and eye exams. Stay current with your vaccines. Tell your health care provider if: You often feel depressed. You have ever been abused or do not feel safe at home. Summary Adopting a healthy lifestyle and getting preventive care are important in promoting health and wellness. Follow your health care provider's instructions about healthy diet, exercising, and getting tested or screened for diseases. Follow your health care provider's  instructions on monitoring your cholesterol and blood pressure. This information is not intended to replace advice given to you by your health care provider. Make sure you discuss any questions you have with your health care provider. Document Revised: 11/27/2020 Document Reviewed: 09/12/2018 Elsevier Patient Education  2022 Elsevier Inc.  

## 2021-06-09 NOTE — Addendum Note (Signed)
Addended by: Guy Franco on: 06/09/2021 12:15 PM   Modules accepted: Orders

## 2021-06-10 LAB — CERVICOVAGINAL ANCILLARY ONLY
Bacterial Vaginitis (gardnerella): NEGATIVE
Candida Glabrata: NEGATIVE
Candida Vaginitis: NEGATIVE
Chlamydia: NEGATIVE
Comment: NEGATIVE
Comment: NEGATIVE
Comment: NEGATIVE
Comment: NEGATIVE
Comment: NEGATIVE
Comment: NORMAL
Neisseria Gonorrhea: NEGATIVE
Trichomonas: NEGATIVE

## 2021-06-17 LAB — CYTOLOGY - PAP
Comment: NEGATIVE
Diagnosis: NEGATIVE
High risk HPV: NEGATIVE

## 2021-06-18 ENCOUNTER — Telehealth: Payer: Self-pay

## 2021-06-18 NOTE — Telephone Encounter (Signed)
-----   Message from [name], MD sent at 06/17/2021  8:06 AM EDT ----- Please inform her that her PAP smear is normal

## 2021-06-18 NOTE — Telephone Encounter (Signed)
Patient name and DOB has been verified Patient was informed of lab results. Patient had no questions.  

## 2021-06-27 ENCOUNTER — Inpatient Hospital Stay (HOSPITAL_COMMUNITY)
Admission: AD | Admit: 2021-06-27 | Discharge: 2021-06-27 | Disposition: A | Discharge status: Home / Self Care | Payer: Self-pay | Attending: Obstetrics and Gynecology | Admitting: Obstetrics and Gynecology

## 2021-06-27 ENCOUNTER — Inpatient Hospital Stay (HOSPITAL_COMMUNITY): Payer: Self-pay

## 2021-06-27 ENCOUNTER — Encounter (HOSPITAL_COMMUNITY): Payer: Self-pay | Admitting: Obstetrics and Gynecology

## 2021-06-27 ENCOUNTER — Other Ambulatory Visit: Payer: Self-pay

## 2021-06-27 DIAGNOSIS — O3680X0 Pregnancy with inconclusive fetal viability, not applicable or unspecified: Secondary | ICD-10-CM | POA: Insufficient documentation

## 2021-06-27 DIAGNOSIS — R1030 Lower abdominal pain, unspecified: Secondary | ICD-10-CM | POA: Insufficient documentation

## 2021-06-27 DIAGNOSIS — K5900 Constipation, unspecified: Secondary | ICD-10-CM | POA: Insufficient documentation

## 2021-06-27 DIAGNOSIS — O09521 Supervision of elderly multigravida, first trimester: Secondary | ICD-10-CM | POA: Insufficient documentation

## 2021-06-27 DIAGNOSIS — O26891 Other specified pregnancy related conditions, first trimester: Secondary | ICD-10-CM | POA: Insufficient documentation

## 2021-06-27 DIAGNOSIS — Z3A01 Less than 8 weeks gestation of pregnancy: Secondary | ICD-10-CM | POA: Insufficient documentation

## 2021-06-27 DIAGNOSIS — O26899 Other specified pregnancy related conditions, unspecified trimester: Secondary | ICD-10-CM

## 2021-06-27 DIAGNOSIS — M545 Low back pain, unspecified: Secondary | ICD-10-CM | POA: Insufficient documentation

## 2021-06-27 DIAGNOSIS — O99611 Diseases of the digestive system complicating pregnancy, first trimester: Secondary | ICD-10-CM | POA: Insufficient documentation

## 2021-06-27 LAB — URINALYSIS, ROUTINE W REFLEX MICROSCOPIC
Bilirubin Urine: NEGATIVE
Glucose, UA: NEGATIVE mg/dL
Hgb urine dipstick: NEGATIVE
Ketones, ur: NEGATIVE mg/dL
Nitrite: NEGATIVE
Protein, ur: NEGATIVE mg/dL
Specific Gravity, Urine: 1.011 (ref 1.005–1.030)
pH: 6 (ref 5.0–8.0)

## 2021-06-27 LAB — CBC
HCT: 39.1 % (ref 36.0–46.0)
Hemoglobin: 13.5 g/dL (ref 12.0–15.0)
MCH: 29.7 pg (ref 26.0–34.0)
MCHC: 34.5 g/dL (ref 30.0–36.0)
MCV: 86.1 fL (ref 80.0–100.0)
Platelets: 365 10*3/uL (ref 150–400)
RBC: 4.54 MIL/uL (ref 3.87–5.11)
RDW: 13.4 % (ref 11.5–15.5)
WBC: 8.2 10*3/uL (ref 4.0–10.5)
nRBC: 0 % (ref 0.0–0.2)

## 2021-06-27 LAB — POCT PREGNANCY, URINE: Preg Test, Ur: POSITIVE — AB

## 2021-06-27 LAB — HCG, QUANTITATIVE, PREGNANCY: hCG, Beta Chain, Quant, S: 6002 m[IU]/mL — ABNORMAL HIGH (ref <5)

## 2021-06-27 MED ORDER — LINACLOTIDE 145 MCG PO CAPS
290.0000 ug | ORAL_CAPSULE | Freq: Once | ORAL | Status: AC
  Administered 2021-06-27: 290 ug via ORAL
  Filled 2021-06-27 (×2): qty 2

## 2021-06-27 MED ORDER — LINACLOTIDE 290 MCG PO CAPS: 290.0000 ug | ORAL_CAPSULE | Freq: Once | ORAL | 0 refills | Status: DC

## 2021-06-27 MED ORDER — ACETAMINOPHEN 500 MG PO TABS
1000.0000 mg | ORAL_TABLET | Freq: Once | ORAL | Status: AC
  Administered 2021-06-27: 1000 mg via ORAL
  Filled 2021-06-27: qty 2

## 2021-06-27 NOTE — MAU Provider Note (Signed)
History     CSN: [id number]  Arrival date and time: 06/27/21 1849   Event Date/Time   First Provider Initiated Contact with Patient 06/27/21 2040      Chief Complaint  Patient presents with   Back Pain   Abdominal Pain   HPI [name] is a 35 y.o. [id number] at [redacted]w[redacted]d who presents for abdominal and back pain.  Symptoms started 2 weeks ago.  Reports low bilateral back pain and mid to lower bilateral abdominal pain.  Pain is constant.  Nothing makes pain better or worse.  Has not treated symptoms.  Rates pain 10/10.  Has history of "bowel irritation" since being bulimic when she was younger states this pain is similar.  Was previously taking Linzess for constipation and bowel irritation, but stopped when she found out she was pregnant.  She also stopped taking her thyroid medication due to pregnancy.  Denies nausea, vomiting, dysuria, vaginal bleeding, or vaginal discharge.  Last bowel movement was on Friday.  Saw her PCP for an annual exam a few weeks ago and had a normal Pap smear and negative vaginal swabs.  OB History     Gravida  5   Para  3   Term  3   Preterm  0   AB  1   Living  3      SAB  1   IAB  0   Ectopic  0   Multiple      Live Births  3           Past Medical History:  Diagnosis Date   Bulimia nervosa    GERD (gastroesophageal reflux disease)     Past Surgical History:  Procedure Laterality Date   COLONOSCOPY WITH PROPOFOL N/A 03/18/2016   Procedure: COLONOSCOPY WITH PROPOFOL;  Surgeon: Vida Rigger, MD;  Location: [facility];  Service: Endoscopy;  Laterality: N/A;   ESOPHAGOGASTRODUODENOSCOPY (EGD) WITH PROPOFOL N/A 09/22/2017   Procedure: ESOPHAGOGASTRODUODENOSCOPY (EGD) WITH PROPOFOL;  Surgeon: Vida Rigger, MD;  [facility] ENDOSCOPY;  Service: Endoscopy;  Laterality: N/A;    Family History  Problem Relation Age of Onset   Diabetes Father    Hearing loss Neg Hx    Cancer Neg Hx    Heart failure Neg Hx     Social History    Tobacco Use   Smoking status: Never   Smokeless tobacco: Never  Vaping Use   Vaping Use: Never used  Substance Use Topics   Alcohol use: No   Drug use: No    Allergies: No Known Allergies  Medications Prior to Admission  Medication Sig Dispense Refill Last Dose   hydrocortisone-pramoxine (ANALPRAM HC) 2.5-1 % rectal cream Place 1 application rectally 3 (three) times daily. 30 g 1    levothyroxine (SYNTHROID) 25 MCG tablet Take 1 tablet (25 mcg total) by mouth daily. 90 tablet 1    pantoprazole (PROTONIX) 40 MG tablet Take 1 tablet (40 mg total) by mouth daily. 30 tablet 3     Review of Systems  Constitutional: Negative.   Gastrointestinal:  Positive for abdominal pain and constipation. Negative for diarrhea, nausea and vomiting.  Genitourinary: Negative.   Musculoskeletal:  Positive for back pain.  Physical Exam   Blood pressure 113/77, pulse 91, temperature 98.4 F (36.9 C), temperature source Oral, resp. rate 17, height 5\' 3"  (1.6 m), weight 64.5 kg, last menstrual period 05/20/2021, SpO2 100 %.  Physical Exam Vitals and nursing note reviewed.  Constitutional:  Appearance: She is well-developed. She is not ill-appearing.  HENT:     Head: Normocephalic and atraumatic.  Eyes:     General: No scleral icterus. Pulmonary:     Effort: Pulmonary effort is normal. No respiratory distress.  Abdominal:     General: Abdomen is flat. Bowel sounds are normal. There is no distension.     Palpations: Abdomen is soft.     Tenderness: There is no abdominal tenderness.  Skin:    General: Skin is warm and dry.  Neurological:     General: No focal deficit present.     Mental Status: She is alert.  Psychiatric:        Mood and Affect: Mood normal.        Behavior: Behavior normal.    MAU Course  Procedures Results for orders placed or performed during the hospital encounter of 06/27/21 (from the past 24 hour(s))  Urinalysis, Routine w reflex microscopic Urine, Clean Catch      Status: Abnormal   Collection Time: 06/27/21  7:45 PM  Result Value Ref Range   Color, Urine YELLOW YELLOW   APPearance CLEAR CLEAR   Specific Gravity, Urine 1.011 1.005 - 1.030   pH 6.0 5.0 - 8.0   Glucose, UA NEGATIVE NEGATIVE mg/dL   Hgb urine dipstick NEGATIVE NEGATIVE   Bilirubin Urine NEGATIVE NEGATIVE   Ketones, ur NEGATIVE NEGATIVE mg/dL   Protein, ur NEGATIVE NEGATIVE mg/dL   Nitrite NEGATIVE NEGATIVE   Leukocytes,Ua TRACE (A) NEGATIVE   RBC / HPF 0-5 0 - 5 RBC/hpf   WBC, UA 0-5 0 - 5 WBC/hpf   Bacteria, UA RARE (A) NONE SEEN   Squamous Epithelial / LPF 0-5 0 - 5  Pregnancy, urine POC     Status: Abnormal   Collection Time: 06/27/21  7:46 PM  Result Value Ref Range   Preg Test, Ur POSITIVE (A) NEGATIVE  CBC     Status: None   Collection Time: 06/27/21  9:04 PM  Result Value Ref Range   WBC 8.2 4.0 - 10.5 K/uL   RBC 4.54 3.87 - 5.11 MIL/uL   Hemoglobin 13.5 12.0 - 15.0 g/dL   HCT 16.1 09.6 - 04.5 %   MCV 86.1 80.0 - 100.0 fL   MCH 29.7 26.0 - 34.0 pg   MCHC 34.5 30.0 - 36.0 g/dL   RDW 40.9 81.1 - 91.4 %   Platelets 365 150 - 400 K/uL   nRBC 0.0 0.0 - 0.2 %  hCG, quantitative, pregnancy     Status: Abnormal   Collection Time: 06/27/21  9:04 PM  Result Value Ref Range   hCG, Beta Chain, Quant, S 6,002 (H) <5 mIU/mL   US OB LESS THAN 14 WEEKS WITH OB TRANSVAGINAL  Result Date: 06/27/2021 CLINICAL DATA:  Gestational age by last menstrual period of 5 weeks and 3 days. Last menstrual period 05/20/2021. Estimated due date by last menstrual period 02/24/2022. Presenting with cramping no vaginal bleeding. EXAM: OBSTETRIC <14 WK [place] AND TRANSVAGINAL OB US TECHNIQUE: Both transabdominal and transvaginal ultrasound examinations were performed for complete evaluation of the gestation as well as the maternal uterus, adnexal regions, and pelvic cul-de-sac. Transvaginal technique was performed to assess early pregnancy. COMPARISON:  None. FINDINGS: Intrauterine gestational sac:  Single Yolk sac:  Not Visualized. Embryo:  Not Visualized. Cardiac Activity: Not Visualized. MSD: 3.9  mm   5 w   0  d Subchorionic hemorrhage:  None visualized. Maternal uterus/adnexae: Bilateral ovaries are unremarkable. The uterus is  otherwise grossly unremarkable. IMPRESSION: Probable early intrauterine gestational sac, but no yolk sac, fetal pole, or cardiac activity yet visualized. Recommend follow-up quantitative B-HCG levels and follow-up US in 14 days to assess viability. This recommendation follows SRU consensus guidelines: Diagnostic Criteria for Nonviable Pregnancy Early in the First Trimester. Malva Limes Med 2013; 897:8478-41. Electronically Signed   By: Tish Frederickson M.D.   On: 06/27/2021 21:46    MDM +UPT UA, CBC, ABO/Rh, quant hCG, and [place] today to rule out ectopic pregnancy which can be life threatening.   Ultrasound shows IUGS, no yolk sac or embryo. No adnexal mass. HCG today is 6002.  Reviewed with Dr. Para March who recommends HCG in a few days & ultrasound in 1-2 weeks.   Pain is consistent with patient's previous GI pain. Encouraged patient to restart her Linzess as prescribed and discussed treatment of constipation.  Hospital provided Spanish interpreter at bedside for this encounter Assessment and Plan   1. Pregnancy of unknown anatomic location  -Scheduled for stat hCG in the office on Wednesday morning - Reviewed ectopic versus SAB precautions -Encouraged to restart her thyroid medication as previously prescribed  2. Abdominal cramping affecting pregnancy   3. Constipation during pregnancy in first trimester  -Continue Linzess as previously prescribed  4. [redacted] weeks gestation of pregnancy      Judeth Horn 06/27/2021, 8:41 PM

## 2021-06-27 NOTE — OB Triage Note (Signed)
[name] is a 35 y.o. at Unknown here in MAU reporting low back and lower abdmonial pain that has been present for the last month - today increased in intensity. Denies vaginal bleeding.  LMP: 05/20/2021   Lab orders placed from triage:  UA                                                   Pregnancy test

## 2021-06-28 ENCOUNTER — Other Ambulatory Visit: Payer: Self-pay

## 2021-06-28 ENCOUNTER — Ambulatory Visit: Payer: Self-pay | Admitting: *Deleted

## 2021-06-28 NOTE — Telephone Encounter (Signed)
Please advise patient if it is safe to take dicyclomine for IBS during pregnancy. Please advise . Care advise given not to take medication until cleared by a doctor. No c/o symptoms abdominal pain at this time. Patient verbalized understanding of care advise and to go to ED if symptoms worsen.

## 2021-06-28 NOTE — Telephone Encounter (Addendum)
Patient called via interpreter [phone]. Patient requesting information regarding medication that was prescribed by her GI doctor. C/o abdominal pain and went to ED for evaluation and was told pain was related to her IBS. Patient contacted OBGYN and was referred to PCP to answer questions regarding if patient can take dicyclomine while pregnant to manage symptoms. Patient is [redacted] weeks pregnant and would like to know if she can continue to take dicyclomine for IBS flare ups.Reason for Disposition  [1] Caller has URGENT medicine question about med that PCP or specialist prescribed AND [2] triager unable to answer question  Answer Assessment - Initial Assessment Questions 1. NAME of MEDICATION: "What medicine are you calling about?"     Dicyclomine  2. QUESTION: "What is your question?" (e.g., double dose of medicine, side effect)     Can I take medication while I am pregnant for IBS? 3. PRESCRIBING HCP: "Who prescribed it?" Reason: if prescribed by specialist, call should be referred to that group.     GI specialist  4. SYMPTOMS: "Do you have any symptoms?"     Not now. Did have abdominal pain and went to ED  5. SEVERITY: If symptoms are present, ask "Are they mild, moderate or severe?"     na 6. PREGNANCY:  "Is there any chance that you are pregnant?" "When was your last menstrual period?"     Yes 5 weeks  Protocols used: Medication Question Call-A-AH

## 2021-06-29 NOTE — Telephone Encounter (Signed)
I would recommend she only take medications prescribed by her OB/GYN as at this point she is under their care.

## 2021-06-30 ENCOUNTER — Other Ambulatory Visit: Payer: Self-pay

## 2021-06-30 ENCOUNTER — Ambulatory Visit (INDEPENDENT_AMBULATORY_CARE_PROVIDER_SITE_OTHER): Payer: Self-pay

## 2021-06-30 ENCOUNTER — Telehealth: Payer: Self-pay | Admitting: Family Medicine

## 2021-06-30 VITALS — BP 127/82 | HR 93 | Wt 141.2 lb

## 2021-06-30 DIAGNOSIS — O3680X0 Pregnancy with inconclusive fetal viability, not applicable or unspecified: Secondary | ICD-10-CM

## 2021-06-30 DIAGNOSIS — R109 Unspecified abdominal pain: Secondary | ICD-10-CM

## 2021-06-30 DIAGNOSIS — O26899 Other specified pregnancy related conditions, unspecified trimester: Secondary | ICD-10-CM

## 2021-06-30 LAB — BETA HCG QUANT (REF LAB): hCG Quant: 6811 m[IU]/mL

## 2021-06-30 MED ORDER — LINACLOTIDE 290 MCG PO CAPS
290.0000 ug | ORAL_CAPSULE | Freq: Once | ORAL | 0 refills | Status: DC
  Filled 2021-06-30 – 2022-06-09 (×4): qty 1, 1d supply, fill #0

## 2021-06-30 MED ORDER — LINACLOTIDE 290 MCG PO CAPS
290.0000 ug | ORAL_CAPSULE | Freq: Once | ORAL | 0 refills | Status: DC
  Filled 2021-06-30: qty 1, 1d supply, fill #0

## 2021-06-30 NOTE — Telephone Encounter (Signed)
Called pt made aware . Verbalized understanding ° °

## 2021-06-30 NOTE — Telephone Encounter (Signed)
Rx cancelled. Order under Dr Alvis Lemmings by mistake. New Rx sent to [facility] per pt's request.  Judeth Cornfield, RN

## 2021-06-30 NOTE — Progress Notes (Signed)
Pt here today for STAT Beta from MAU follow up on 06/27/2021. Last beta at MAU was 6,002.  Pt denies any vaginal bleeding, pain or cramps. Pt advised after results come back from Beta, will be contacted via phone with plan of care per Dr Macon Large. Pt verbalized understanding.    Judeth Cornfield, RN

## 2021-06-30 NOTE — Progress Notes (Signed)
Patient was assessed and managed by nursing staff during this encounter. I have reviewed the chart and agree with the documentation and plan.   Jaynie Collins, MD 06/30/2021 7:21 PM

## 2021-06-30 NOTE — Telephone Encounter (Signed)
Linzess was sent to the pharmacy in my name when I never prescribed it for this patient.  I would appreciate it if you call the pharmacy and cancel this prescription.  If her OB/GYN is prescribing it please send it under the name of the appropriate obstetrician.  Thank you

## 2021-06-30 NOTE — Progress Notes (Signed)
Results reviewed with Dr Macon Large. Advised abnormal rise in beta and to have urgent US today or tomorrow.  Pt scheduled for [place] at [facility] 9/29 at 8am.   Call placed to pt. Spoke with pt with interpreter Eda R. Pt given results and recommendations per Dr Macon Large. Pt verbalized understanding and agreeable to plan of care.   Judeth Cornfield, RN

## 2021-07-01 ENCOUNTER — Other Ambulatory Visit: Payer: Self-pay

## 2021-07-01 ENCOUNTER — Other Ambulatory Visit: Payer: Self-pay | Admitting: Obstetrics & Gynecology

## 2021-07-01 ENCOUNTER — Ambulatory Visit (INDEPENDENT_AMBULATORY_CARE_PROVIDER_SITE_OTHER): Payer: Self-pay

## 2021-07-01 ENCOUNTER — Ambulatory Visit
Admission: RE | Admit: 2021-07-01 | Discharge: 2021-07-01 | Disposition: A | Discharge status: Home / Self Care | Payer: Self-pay | Source: Ambulatory Visit | Attending: Obstetrics & Gynecology | Admitting: Obstetrics & Gynecology

## 2021-07-01 VITALS — BP 119/73 | HR 68 | Wt 141.0 lb

## 2021-07-01 DIAGNOSIS — O3680X0 Pregnancy with inconclusive fetal viability, not applicable or unspecified: Secondary | ICD-10-CM | POA: Insufficient documentation

## 2021-07-01 DIAGNOSIS — O26899 Other specified pregnancy related conditions, unspecified trimester: Secondary | ICD-10-CM

## 2021-07-01 DIAGNOSIS — R109 Unspecified abdominal pain: Secondary | ICD-10-CM

## 2021-07-01 NOTE — Progress Notes (Signed)
Pt here today for OB US results. Pt has interpreter Victorino Dike from [place]. Pt denies any vaginal bleeding, pain or cramps at this time.   Results reviewed by Samara Deist, CNM with pt. Samara Deist, CNM spoke with pt. Results given. Pt advised to have repeat Stat beta tomorrow for follow up. Pt tearful about possible SAB. Pt advised will follow up after beta tomorrow for plan of care. Pt verbalized understanding.   Judeth Cornfield, RN

## 2021-07-02 ENCOUNTER — Ambulatory Visit (INDEPENDENT_AMBULATORY_CARE_PROVIDER_SITE_OTHER): Payer: Self-pay

## 2021-07-02 ENCOUNTER — Other Ambulatory Visit: Payer: Self-pay

## 2021-07-02 VITALS — BP 116/78 | HR 90 | Wt 142.8 lb

## 2021-07-02 DIAGNOSIS — O3680X0 Pregnancy with inconclusive fetal viability, not applicable or unspecified: Secondary | ICD-10-CM

## 2021-07-02 LAB — BETA HCG QUANT (REF LAB): hCG Quant: 7518 m[IU]/mL

## 2021-07-02 NOTE — Progress Notes (Signed)
Beta HCG Follow-up Visit  [name] presents to [facility] for follow-up beta HCG lab. She was seen in MAU on 06/27/21 for abdominal pain in early pregnancy. Patient was seen in office on 06/30/21 in office for repeat beta HCG. Patient reports continued abdominal pain (5/10 on pain scale) and is taking PRN Tylenol 500 mg for this. Explained to patient she can take up to 2 extra strength Tylenol every 6 hours. Denies vaginal bleeding. Reports picking up Linzess to have at home if she needs this for constipation. Discussed with patient that we are following beta HCG levels today. Results will be back in approximately 2 hours. Valid contact number for patient confirmed; office will call with results today. Interpreter Raquel present for entire encounter.  Beta HCG results: 06/27/21 6002  06/30/21 6811  07/02/21 7518   Results and patient history reviewed with Shawnie Pons, MD, who states this does not appear to be a viable pregnancy and patient should return for Korea 7 days from initial Korea. Patient called and informed of plan for follow-up by Judeth Cornfield, RN. [place] scheduled for 07/08/21.  Marjo Bicker 07/02/2021 8:32 AM

## 2021-07-02 NOTE — Progress Notes (Signed)
Patient seen and assessed by nursing staff.  Agree with documentation and plan.  

## 2021-07-02 NOTE — Progress Notes (Signed)
Results reviewed with Dr Shawnie Pons. Pt advised to have [place] in 1 week to confirm viability. Pt called with interpreter Raquel and given results and recommendations. Pt agreeable to plan of care.  Pt scheduled for Korea on 10/6@ 0800. Pt verbalized understanding of date and time of appt.   Judeth Cornfield, RN

## 2021-07-08 ENCOUNTER — Other Ambulatory Visit: Payer: Self-pay | Admitting: Medical

## 2021-07-08 ENCOUNTER — Ambulatory Visit
Admission: RE | Admit: 2021-07-08 | Discharge: 2021-07-08 | Disposition: A | Discharge status: Home / Self Care | Payer: Self-pay | Source: Ambulatory Visit | Attending: Family Medicine | Admitting: Family Medicine

## 2021-07-08 ENCOUNTER — Telehealth: Payer: Self-pay | Admitting: Student

## 2021-07-08 ENCOUNTER — Telehealth: Payer: Self-pay | Admitting: Medical

## 2021-07-08 ENCOUNTER — Other Ambulatory Visit: Payer: Self-pay

## 2021-07-08 DIAGNOSIS — O3680X0 Pregnancy with inconclusive fetal viability, not applicable or unspecified: Secondary | ICD-10-CM | POA: Insufficient documentation

## 2021-07-08 DIAGNOSIS — O26891 Other specified pregnancy related conditions, first trimester: Secondary | ICD-10-CM

## 2021-07-08 DIAGNOSIS — R109 Unspecified abdominal pain: Secondary | ICD-10-CM

## 2021-07-08 NOTE — Progress Notes (Signed)
Chart reviewed for nurse visit. Agree with plan of care.   Marylene Land, CNM 07/08/2021 5:23 PM

## 2021-07-08 NOTE — Telephone Encounter (Signed)
Attempted to contact patient with Korea results from earlier today. No answer and VM is full. Unable to leave message. Will send MyChart message. Follow-up indicated, patient will need another Korea to confirm viability in 7-10 days. Will order and request office to follow-up.   Vonzella Nipple, PA-C 07/08/2021 1:19 PM

## 2021-07-08 NOTE — Telephone Encounter (Signed)
Two telephone calls to patient to give results and schedule Korea. Reviewed in detail the Korea results and plan for follow up in 11 days. Explained that pregnancy viablity is still uncertain; reviewed that she should go to MAU if any bleeding, patient given directions and instructions for Korea on 10/17. Patient verbalized understanding.   [name]

## 2021-07-19 ENCOUNTER — Telehealth: Payer: Self-pay | Admitting: *Deleted

## 2021-07-19 ENCOUNTER — Ambulatory Visit
Admission: RE | Admit: 2021-07-19 | Discharge: 2021-07-19 | Disposition: A | Discharge status: Home / Self Care | Payer: Self-pay | Source: Ambulatory Visit | Attending: Medical | Admitting: Medical

## 2021-07-19 ENCOUNTER — Ambulatory Visit (INDEPENDENT_AMBULATORY_CARE_PROVIDER_SITE_OTHER): Payer: Self-pay

## 2021-07-19 ENCOUNTER — Other Ambulatory Visit: Payer: Self-pay

## 2021-07-19 DIAGNOSIS — R109 Unspecified abdominal pain: Secondary | ICD-10-CM | POA: Insufficient documentation

## 2021-07-19 DIAGNOSIS — R300 Dysuria: Secondary | ICD-10-CM

## 2021-07-19 DIAGNOSIS — O3680X0 Pregnancy with inconclusive fetal viability, not applicable or unspecified: Secondary | ICD-10-CM

## 2021-07-19 DIAGNOSIS — O26891 Other specified pregnancy related conditions, first trimester: Secondary | ICD-10-CM | POA: Insufficient documentation

## 2021-07-19 NOTE — Progress Notes (Signed)
Pt here today for [place] results. Reviewed results per Dr Donavan Foil. Pt advised results are still not definitive for failed pregnancy, but is suspicious and to have rescan in 2 weeks for viability.  [place]orea scheduled for 10/31@ 10am. Pt agreeable to plan of care.   Pt also having urine symptoms of burning and pain with urination x 2 week.  Denies any fever, but having small mild back pain. Will send for OB urine culture. Pt verbalized understanding.   Judeth Cornfield, RN

## 2021-07-19 NOTE — Telephone Encounter (Signed)
Received voice message  from [facility] stating Dr. Genevie Ann trying to contact Vonzella Nipple re: mutual patient. I could not understand patient name. I called [facility] and clarified patient name. Dr. Genevie Ann wanted to be sure provider aware of results.per chart review I informed results reviewed by Dr. Donavan Foil and patient informed of results. Helia Haese,RN

## 2021-07-20 NOTE — Progress Notes (Signed)
Patient was assessed and managed by nursing staff during this encounter. I have reviewed the chart and agree with the documentation and plan. I have also made any necessary editorial changes.  Warden Fillers, MD 07/20/2021 1:06 PM

## 2021-07-21 LAB — CULTURE, OB URINE

## 2021-07-21 LAB — URINE CULTURE, OB REFLEX: Organism ID, Bacteria: NO GROWTH

## 2021-07-30 ENCOUNTER — Other Ambulatory Visit: Payer: Self-pay

## 2021-07-30 ENCOUNTER — Inpatient Hospital Stay (HOSPITAL_COMMUNITY)
Admission: AD | Admit: 2021-07-30 | Discharge: 2021-07-30 | Disposition: A | Discharge status: Home / Self Care | Payer: Self-pay | Attending: Obstetrics & Gynecology | Admitting: Obstetrics & Gynecology

## 2021-07-30 ENCOUNTER — Encounter (HOSPITAL_COMMUNITY): Payer: Self-pay | Admitting: Obstetrics & Gynecology

## 2021-07-30 ENCOUNTER — Inpatient Hospital Stay (HOSPITAL_COMMUNITY): Payer: Self-pay

## 2021-07-30 DIAGNOSIS — O209 Hemorrhage in early pregnancy, unspecified: Secondary | ICD-10-CM

## 2021-07-30 DIAGNOSIS — Z3A10 10 weeks gestation of pregnancy: Secondary | ICD-10-CM | POA: Insufficient documentation

## 2021-07-30 DIAGNOSIS — Z79890 Hormone replacement therapy (postmenopausal): Secondary | ICD-10-CM | POA: Insufficient documentation

## 2021-07-30 DIAGNOSIS — O021 Missed abortion: Secondary | ICD-10-CM

## 2021-07-30 HISTORY — DX: Thyrotoxicosis, unspecified without thyrotoxic crisis or storm: E05.90

## 2021-07-30 NOTE — MAU Provider Note (Signed)
History     CSN: [id number]  Arrival date and time: 07/30/21 1528   Event Date/Time   First Provider Initiated Contact with Patient 07/30/21 1634      Chief Complaint  Patient presents with   Vaginal Bleeding   Abdominal Pain   HPI [name] is a 35 y.o. [id number] at [redacted]w[redacted]d who presents with vaginal bleeding and abdominal cramping. Being followed for probable failed pregnancy, next ultrasound scheduled for Monday to determine final diagnosis.  Reports bleeding since Sunday. Not saturating pads or passing blood clots. Today bleeding is brown spotting on toilet paper. Reports some lower abdominal cramping. Rates pain 2/10.   OB History     Gravida  5   Para  3   Term  3   Preterm  0   AB  1   Living  3      SAB  1   IAB  0   Ectopic  0   Multiple      Live Births  3           Past Medical History:  Diagnosis Date   Bulimia nervosa    GERD (gastroesophageal reflux disease)    Hyperthyroidism     Past Surgical History:  Procedure Laterality Date   COLONOSCOPY WITH PROPOFOL N/A 03/18/2016   Procedure: COLONOSCOPY WITH PROPOFOL;  Surgeon: Vida Rigger, MD;  Location: [facility] ENDOSCOPY;  Service: Endoscopy;  Laterality: N/A;   ESOPHAGOGASTRODUODENOSCOPY (EGD) WITH PROPOFOL N/A 09/22/2017   Procedure: ESOPHAGOGASTRODUODENOSCOPY (EGD) WITH PROPOFOL;  Surgeon: Vida Rigger, MD;  Location: [facility] ENDOSCOPY;  Service: Endoscopy;  Laterality: N/A;    Family History  Problem Relation Age of Onset   Diabetes Father    Hearing loss Neg Hx    Cancer Neg Hx    Heart failure Neg Hx     Social History   Tobacco Use   Smoking status: Never   Smokeless tobacco: Never  Vaping Use   Vaping Use: Never used  Substance Use Topics   Alcohol use: No   Drug use: No    Allergies: No Known Allergies  Medications Prior to Admission  Medication Sig Dispense Refill Last Dose   levothyroxine (SYNTHROID) 25 MCG tablet Take 1 tablet (25 mcg total) by mouth daily. 90  tablet 1 07/30/2021   acetaminophen (TYLENOL) 500 MG tablet Take 500 mg by mouth every 6 (six) hours as needed.      linaclotide (LINZESS) 290 MCG CAPS capsule Take 1 capsule (290 mcg total) by mouth once for 1 dose. 1 capsule 0     Review of Systems  Constitutional: Negative.   Gastrointestinal:  Positive for abdominal pain.  Genitourinary:  Positive for vaginal bleeding. Negative for dysuria and vaginal discharge.  Physical Exam   Blood pressure 119/83, pulse 89, resp. rate 17, last menstrual period 05/20/2021, SpO2 99 %.  Physical Exam Vitals and nursing note reviewed.  Constitutional:      Appearance: She is well-developed. She is not ill-appearing.  HENT:     Head: Normocephalic and atraumatic.  Pulmonary:     Effort: Pulmonary effort is normal. No respiratory distress.  Neurological:     General: No focal deficit present.     Mental Status: She is alert.  Psychiatric:        Mood and Affect: Mood normal.        Behavior: Behavior normal.    MAU Course  Procedures US OB Transvaginal  Result Date: 07/30/2021 CLINICAL DATA:  Vaginal bleeding, first trimester of pregnancy. EXAM: TRANSVAGINAL OB ULTRASOUND TECHNIQUE: Transvaginal ultrasound was performed for complete evaluation of the gestation as well as the maternal uterus, adnexal regions, and pelvic cul-de-sac. COMPARISON:  July 19, 2021.  July 08, 2021. FINDINGS: Intrauterine gestational sac: Single Yolk sac:  Not Visualized. Embryo:  Not Visualized. Cardiac Activity: Not Visualized. MSD: 14.9 mm   6 w   2 d Subchorionic hemorrhage:  None visualized. Maternal uterus/adnexae: Ovaries unremarkable. No free fluid is noted. IMPRESSION: Probable intrauterine gestational sac is noted, but no definite yolk sac or fetal pole is seen at this time. It should be noted that yolk sac was noted on prior studies, including 22 days ago. Findings meet definitive criteria for failed pregnancy. This follows SRU consensus guidelines:  Diagnostic Criteria for Nonviable Pregnancy Early in the First Trimester. [facility] Mis J Med [phone]. Electronically Signed   By: Lupita Raider M.D.   On: 07/30/2021 17:19    MDM Patient presents with minimal bleeding & is stable. She is RH positive. Ultrasound today meets definitive criteria for failed pregnancy.   Discussed options for management of incomplete AB including expectant management, Cytotec or D&C. Prefers medical management at this time. Verbalizes understanding that intervention may become necessary if SAB in not completed spontaneously or if heavy bleeding or infection occur.   Hospital provided Spanish interpreter present for this encounter Assessment and Plan   1. Missed abortion   2. Vaginal bleeding in pregnancy, first trimester    -Will rx cytotec & oxycodone on Monday morning when her pharmacy opens [facility] - patient declines changing pharmacy) -pelvic rest -reviewed bleeding/infection precautions & reasons to return to MAU -message to Julienne Kass to cancel ultrasound on Monday -message to [facility] for SAB f/u appointment in 2 weeks   Judeth Horn 07/30/2021, 4:35 PM

## 2021-07-30 NOTE — MAU Note (Signed)
.  [name] is a 35 y.o. at [redacted]w[redacted]d here in MAU reporting: Patient states she had intercourse on Sunday and since then she has been experiencing vaginal bleeding and lower abdominal pain since then. She states she only sees the bleeding when she wipes and has not had to use any pads. She denies any odors or seeing any blood clots. She states the bleeding is brown today and she last saw red blood yesterday.  Pain score:  4/10 lower abdominal pain  FHT: Unable to doppler fetal heart tones

## 2021-07-30 NOTE — MAU Note (Signed)
Patient left without signing AVS

## 2021-07-31 ENCOUNTER — Telehealth: Payer: Self-pay | Admitting: Radiology

## 2021-07-31 NOTE — Telephone Encounter (Signed)
Left voicemail vis interpreter services informing patient of follow up visit at [facility] on 08/19/21 @ 10:55.

## 2021-08-02 ENCOUNTER — Other Ambulatory Visit: Payer: Self-pay | Admitting: Family Medicine

## 2021-08-02 ENCOUNTER — Inpatient Hospital Stay: Admission: RE | Admit: 2021-08-02 | Payer: Self-pay | Source: Ambulatory Visit

## 2021-08-02 ENCOUNTER — Other Ambulatory Visit: Payer: Self-pay | Admitting: Student

## 2021-08-02 ENCOUNTER — Other Ambulatory Visit: Payer: Self-pay

## 2021-08-02 DIAGNOSIS — O039 Complete or unspecified spontaneous abortion without complication: Secondary | ICD-10-CM

## 2021-08-02 MED ORDER — TRAMADOL HCL 50 MG PO TABS
50.0000 mg | ORAL_TABLET | Freq: Four times a day (QID) | ORAL | 0 refills | Status: DC | PRN
  Filled 2021-08-02: qty 15, 4d supply, fill #0

## 2021-08-02 MED ORDER — MISOPROSTOL 200 MCG PO TABS
800.0000 ug | ORAL_TABLET | Freq: Every day | ORAL | 0 refills | Status: DC | PRN
  Filled 2021-08-02 (×2): qty 8, 2d supply, fill #0

## 2021-08-02 MED ORDER — OXYCODONE-ACETAMINOPHEN 5-325 MG PO TABS
1.0000 | ORAL_TABLET | ORAL | 0 refills | Status: DC | PRN
  Filled 2021-08-02: qty 15, 3d supply, fill #0

## 2021-08-02 NOTE — Progress Notes (Signed)
Incomplete AB. Pain medicine prescription

## 2021-08-03 ENCOUNTER — Other Ambulatory Visit: Payer: Self-pay

## 2021-08-13 ENCOUNTER — Ambulatory Visit: Payer: Self-pay

## 2021-08-17 ENCOUNTER — Other Ambulatory Visit: Payer: Self-pay

## 2021-08-17 ENCOUNTER — Ambulatory Visit: Payer: Self-pay | Attending: Family Medicine

## 2021-08-19 ENCOUNTER — Encounter: Payer: Self-pay | Admitting: Nurse Practitioner

## 2021-08-19 ENCOUNTER — Ambulatory Visit (INDEPENDENT_AMBULATORY_CARE_PROVIDER_SITE_OTHER): Payer: Self-pay | Admitting: Nurse Practitioner

## 2021-08-19 ENCOUNTER — Other Ambulatory Visit: Payer: Self-pay

## 2021-08-19 ENCOUNTER — Other Ambulatory Visit (HOSPITAL_COMMUNITY)
Admission: RE | Admit: 2021-08-19 | Discharge: 2021-08-19 | Disposition: A | Discharge status: Home / Self Care | Payer: Self-pay | Source: Ambulatory Visit | Attending: Nurse Practitioner | Admitting: Nurse Practitioner

## 2021-08-19 VITALS — BP 114/81 | HR 85 | Wt 149.0 lb

## 2021-08-19 DIAGNOSIS — R309 Painful micturition, unspecified: Secondary | ICD-10-CM

## 2021-08-19 DIAGNOSIS — K589 Irritable bowel syndrome without diarrhea: Secondary | ICD-10-CM | POA: Insufficient documentation

## 2021-08-19 DIAGNOSIS — K921 Melena: Secondary | ICD-10-CM

## 2021-08-19 DIAGNOSIS — M79605 Pain in left leg: Secondary | ICD-10-CM

## 2021-08-19 DIAGNOSIS — R198 Other specified symptoms and signs involving the digestive system and abdomen: Secondary | ICD-10-CM

## 2021-08-19 DIAGNOSIS — K648 Other hemorrhoids: Secondary | ICD-10-CM | POA: Insufficient documentation

## 2021-08-19 DIAGNOSIS — K2100 Gastro-esophageal reflux disease with esophagitis, without bleeding: Secondary | ICD-10-CM | POA: Insufficient documentation

## 2021-08-19 DIAGNOSIS — R1030 Lower abdominal pain, unspecified: Secondary | ICD-10-CM | POA: Insufficient documentation

## 2021-08-19 DIAGNOSIS — O039 Complete or unspecified spontaneous abortion without complication: Secondary | ICD-10-CM

## 2021-08-19 DIAGNOSIS — K5909 Other constipation: Secondary | ICD-10-CM | POA: Insufficient documentation

## 2021-08-19 HISTORY — DX: Melena: K92.1

## 2021-08-19 NOTE — Progress Notes (Signed)
GYNECOLOGY OFFICE VISIT NOTE   History:  35 y.o. [id number] here today for follow up from a miscarriage.  Dhe was given Cytotec for a incomplete miscarriage.  She took cytotec buccally as directed.  She had vaginal bleeding consistent with a miscarriage in the 24 hours following her dose.  She understood she was to take the second dose of cytotec the next day and did take the cytotec.  She had no further bleeding but reports that she started having tingling and burning in her left leg to the point she is not able to sleep at night.  She also reports low back pain. She denies any abnormal vaginal discharge, bleeding, pelvic pain or other concerns.   Past Medical History:  Diagnosis Date   Bulimia nervosa    GERD (gastroesophageal reflux disease)    Hyperthyroidism     Past Surgical History:  Procedure Laterality Date   COLONOSCOPY WITH PROPOFOL N/A 03/18/2016   Procedure: COLONOSCOPY WITH PROPOFOL;  Surgeon: Vida Rigger, MD;  Location: [facility];  Service: Endoscopy;  Laterality: N/A;   ESOPHAGOGASTRODUODENOSCOPY (EGD) WITH PROPOFOL N/A 09/22/2017   Procedure: ESOPHAGOGASTRODUODENOSCOPY (EGD) WITH PROPOFOL;  Surgeon: Vida Rigger, MD;  Location: [facility] ENDOSCOPY;  Service: Endoscopy;  Laterality: N/A;    The following portions of the patient's history were reviewed and updated as appropriate: allergies, current medications, past family history, past medical history, past social history, past surgical history and problem list.   Health Maintenance:  Normal pap and negative HRHPV on 06-09-21.    Review of Systems:  Pertinent items noted in HPI and remainder of comprehensive ROS otherwise negative.  Objective:  Physical Exam BP 114/81   Pulse 85   Wt 149 lb (67.6 kg)   LMP 05/20/2021 (Approximate)   Breastfeeding Unknown   BMI 26.39 kg/m  CONSTITUTIONAL: Well-developed, well-nourished female in no acute distress.  HENT:  Normocephalic, atraumatic. External right and left ear normal.   EYES: Conjunctivae and EOM are normal. Pupils are equal, round.  No scleral icterus.  NECK: Normal range of motion, supple, no masses SKIN: Skin is warm and dry. No rash noted. Not diaphoretic. No erythema. No pallor. NEUROLOGIC: Alert and oriented to person, place, and time. Normal muscle tone coordination. No cranial nerve deficit noted. PSYCHIATRIC: Normal mood and affect. Normal behavior. Normal judgment and thought content. CARDIOVASCULAR: Normal heart rate noted RESPIRATORY: Effort and breath sounds normal, no problems with respiration noted ABDOMEN: Soft, no distention noted.   PELVIC: Deferred MUSCULOSKELETAL: Normal range of motion. No edema noted.  Labs and Imaging US OB Transvaginal  Result Date: 07/30/2021 CLINICAL DATA:  Vaginal bleeding, first trimester of pregnancy. EXAM: TRANSVAGINAL OB ULTRASOUND TECHNIQUE: Transvaginal ultrasound was performed for complete evaluation of the gestation as well as the maternal uterus, adnexal regions, and pelvic cul-de-sac. COMPARISON:  July 19, 2021.  July 08, 2021. FINDINGS: Intrauterine gestational sac: Single Yolk sac:  Not Visualized. Embryo:  Not Visualized. Cardiac Activity: Not Visualized. MSD: 14.9 mm   6 w   2 d Subchorionic hemorrhage:  None visualized. Maternal uterus/adnexae: Ovaries unremarkable. No free fluid is noted. IMPRESSION: Probable intrauterine gestational sac is noted, but no definite yolk sac or fetal pole is seen at this time. It should be noted that yolk sac was noted on prior studies, including 22 days ago. Findings meet definitive criteria for failed pregnancy. This follows SRU consensus guidelines: Diagnostic Criteria for Nonviable Pregnancy Early in the First Trimester. [facility] [phone]. Electronically Signed   By: Fayrene Fearing  Christen Butter M.D.   On: 07/30/2021 17:19    Assessment & Plan:  1. Miscarriage In person interpreter present for her entire visit. By patient report, she had a normal miscarriage the  first day when she took cytotec.  Thought she was to take the second cytotec dose and did.  Then began having the additional problems she describes today. Talked at length with patient and explained she most likely has several problems - the miscarriage and leg tingling with back pain for 2 problems.  2. Lower abdominal pain As been taking 1000mg  of Advil for pain Reviewed maximum dose of Advil as 3 tablets with food.  Instructed not to take more for a single dose.  May repeat her dose in 6 hours. Will check for common infections as a reason for abdominal pain Reassured that her miscarriage would not be causing this amount of pain at this time.  - Cervicovaginal ancillary only( [place])  3. Urinary pain Reports taking appropriate fluids for hydration daily Will check for infection as having urinary urgency and pain with urination  - Urine Culture - Cervicovaginal ancillary only( [place])  4. Pain with bowel movements Bowel movements are hard to initiate but soft in texture.  Says she has urinary pain and bowel pain as she defecates. Has had a colonoscopy previously for a similar problem.  Advised she may need to see her PCP and may need another colonoscopy if her problem is continuing.  5. Pain of left lower extremity Has tingling and numbness almost all day and much worse at night. Advised if this does not resolve, she will need to see Family Practice or orthopedics.   Routine preventative health maintenance measures emphasized. Please refer to After Visit Summary for other counseling recommendations.   Return in about 3 weeks (around 09/09/2021) for recheck with provider for abdominal pain.  If still having lower abdominal pain, may need to consider ultrasound at that time.  Patient is working on 14/05/2021 and it is not active at this time.  Did not want Corporate investment banker scheduled today.   Total face-to-face time with patient: 15 minutes.  Over 50% of encounter was spent on  counseling and coordination of care.  [place], RN, MSN, NP-BC Nurse Practitioner, [facility] for [facility] 08/19/2021 7:54 PM

## 2021-08-19 NOTE — Progress Notes (Signed)
Patient is here today for follow up from miscarriage. Patient reports "numbing/stinging" sensation along with pain during bowel movement/urination.

## 2021-08-20 LAB — CERVICOVAGINAL ANCILLARY ONLY
Bacterial Vaginitis (gardnerella): NEGATIVE
Candida Glabrata: NEGATIVE
Candida Vaginitis: NEGATIVE
Chlamydia: NEGATIVE
Comment: NEGATIVE
Comment: NEGATIVE
Comment: NEGATIVE
Comment: NEGATIVE
Comment: NEGATIVE
Comment: NORMAL
Neisseria Gonorrhea: NEGATIVE
Trichomonas: NEGATIVE

## 2021-08-23 LAB — URINE CULTURE

## 2021-09-09 ENCOUNTER — Ambulatory Visit: Payer: Self-pay | Admitting: Certified Nurse Midwife

## 2021-09-14 ENCOUNTER — Other Ambulatory Visit: Payer: Self-pay

## 2021-10-18 ENCOUNTER — Other Ambulatory Visit: Payer: Self-pay

## 2021-10-20 ENCOUNTER — Other Ambulatory Visit: Payer: Self-pay | Admitting: Family Medicine

## 2021-11-29 ENCOUNTER — Other Ambulatory Visit: Payer: Self-pay

## 2021-12-07 ENCOUNTER — Encounter: Payer: Self-pay | Admitting: Family Medicine

## 2021-12-07 ENCOUNTER — Other Ambulatory Visit: Payer: Self-pay

## 2021-12-07 ENCOUNTER — Ambulatory Visit: Payer: Self-pay | Attending: Family Medicine | Admitting: Family Medicine

## 2021-12-07 VITALS — BP 110/77 | HR 79 | Ht 63.0 in | Wt 154.0 lb

## 2021-12-07 DIAGNOSIS — M940 Chondrocostal junction syndrome [Tietze]: Secondary | ICD-10-CM

## 2021-12-07 DIAGNOSIS — E039 Hypothyroidism, unspecified: Secondary | ICD-10-CM

## 2021-12-07 DIAGNOSIS — Z1159 Encounter for screening for other viral diseases: Secondary | ICD-10-CM

## 2021-12-07 NOTE — Progress Notes (Signed)
? ?Subjective:  ?Patient ID: [name], female    DOB: 10/31/1985  Age: 36 y.o. MRN: [id number] ? ?CC: Hypothyroidism ? ? ?HPI ?[name] is a 36 y.o. year old female with a history of hypothyroidism who presents for a follow up visit. ?She had a missed abortion in 07/2021 and is concerned that she has had two missed abortions in a row. ? ?Interval History: ?Endorses compliance with levothyroxine. ?She has has pain in her left chest wall when she is stressed and has been intermittent for the last couple of weeks.  Denies presence of dyspnea, left arm pain, diaphoresis.  Pain is worse when she pushes on her chest wall. ?Past Medical History:  ?Diagnosis Date  ? Bulimia nervosa   ? GERD (gastroesophageal reflux disease)   ? Hyperthyroidism   ? ? ?Past Surgical History:  ?Procedure Laterality Date  ? COLONOSCOPY WITH PROPOFOL N/A 03/18/2016  ? Procedure: COLONOSCOPY WITH PROPOFOL;  Surgeon: Vida Rigger, MD;  Location: [facility];  Service: Endoscopy;  Laterality: N/A;  ? ESOPHAGOGASTRODUODENOSCOPY (EGD) WITH PROPOFOL N/A 09/22/2017  ? Procedure: ESOPHAGOGASTRODUODENOSCOPY (EGD) WITH PROPOFOL;  Surgeon: Vida Rigger, MD;  Location: [facility] ENDOSCOPY;  Service: Endoscopy;  Laterality: N/A;  ? ? ?Family History  ?Problem Relation Age of Onset  ? Diabetes Father   ? Hearing loss Neg Hx   ? Cancer Neg Hx   ? Heart failure Neg Hx   ? ? ?No Known Allergies ? ?Outpatient Medications Prior to Visit  ?Medication Sig Dispense Refill  ? dicyclomine (BENTYL) 10 MG capsule 1 capsule    ? levothyroxine (SYNTHROID) 25 MCG tablet Take 1 tablet (25 mcg total) by mouth daily. 90 tablet 1  ? linaclotide (LINZESS) 290 MCG CAPS capsule Take 1 capsule (290 mcg total) by mouth once for 1 dose. 1 capsule 0  ? ?No facility-administered medications prior to visit.  ? ? ? ?ROS ?Review of Systems  ?Constitutional:  Negative for activity change, appetite change and fatigue.  ?HENT:  Negative for congestion, sinus pressure and  sore throat.   ?Eyes:  Negative for visual disturbance.  ?Respiratory:  Negative for cough, chest tightness, shortness of breath and wheezing.   ?Cardiovascular:  Positive for chest pain. Negative for palpitations.  ?Gastrointestinal:  Negative for abdominal distention, abdominal pain and constipation.  ?Endocrine: Negative for polydipsia.  ?Genitourinary:  Negative for dysuria and frequency.  ?Musculoskeletal:  Negative for arthralgias and back pain.  ?Skin:  Negative for rash.  ?Neurological:  Negative for tremors, light-headedness and numbness.  ?Hematological:  Does not bruise/bleed easily.  ?Psychiatric/Behavioral:  Negative for agitation and behavioral problems.   ? ?Objective:  ?BP 110/77   Pulse 79   Ht 5\' 3"  (1.6 m)   Wt 154 lb (69.9 kg)   LMP 05/20/2021 (Approximate)   SpO2 98%   BMI 27.28 kg/m?  ? ?BP/Weight 12/07/2021 08/19/2021 07/30/2021  ?Systolic BP 110 114 123  ?Diastolic BP 77 81 82  ?Wt. (Lbs) 154 149 -  ?BMI 27.28 26.39 -  ? ? ? ? ?Physical Exam ?Constitutional:   ?   Appearance: She is well-developed.  ?Cardiovascular:  ?   Rate and Rhythm: Normal rate.  ?   Heart sounds: Normal heart sounds. No murmur heard. ?Pulmonary:  ?   Effort: Pulmonary effort is normal.  ?   Breath sounds: Normal breath sounds. No wheezing or rales.  ?Chest:  ?   Chest wall: Tenderness (L sided reproducible) present.  ?Breasts: ?  Right: No mass or tenderness.  ?   Left: No mass or tenderness.  ?Abdominal:  ?   General: Bowel sounds are normal. There is no distension.  ?   Palpations: Abdomen is soft. There is no mass.  ?   Tenderness: There is no abdominal tenderness.  ?Musculoskeletal:     ?   General: Normal range of motion.  ?   Right lower leg: No edema.  ?   Left lower leg: No edema.  ?Neurological:  ?   Mental Status: She is alert and oriented to person, place, and time.  ?Psychiatric:     ?   Mood and Affect: Mood normal.  ? ? ?CMP Latest Ref Rng & Units 01/28/2021 08/06/2020 01/21/2019  ?Glucose 65 - 99 mg/dL  88 89 84  ?BUN 6 - 20 mg/dL 9 11 8   ?Creatinine 0.57 - 1.00 mg/dL 2.44 6.28  ?Sodium 134 - 144 mmol/L 139 139 135  ?Potassium 3.5 - 5.2 mmol/L 4.1 4.3 4.5  ?Chloride 96 - 106 mmol/L 102 102 99  ?CO2 20 - 29 mmol/L 19(L) 25 23  ?Calcium 8.7 - 10.2 mg/dL 9.6 9.6 9.6  ?Total Protein 6.0 - 8.5 g/dL - 7.2 -  ?Total Bilirubin 0.0 - 1.2 mg/dL - 0.2 -  ?Alkaline Phos 44 - 121 IU/L - 92 -  ?AST 0 - 40 IU/L - 20 -  ?ALT 0 - 32 IU/L - 18 -  ? ? ?Lipid Panel  ?No results found for: CHOL, TRIG, HDL, CHOLHDL, VLDL, LDLCALC, LDLDIRECT ? ?CBC ?   ?Component Value Date/Time  ? WBC 8.2 06/27/2021 2104  ? RBC 4.54 06/27/2021 2104  ? HGB 13.5 06/27/2021 2104  ? HGB 14.5 01/28/2021 0953  ? HCT 39.1 06/27/2021 2104  ? HCT 42.6 01/28/2021 0953  ? PLT 365 06/27/2021 2104  ? PLT 424 01/28/2021 0953  ? MCV 86.1 06/27/2021 2104  ? MCV 88 01/28/2021 0953  ? MCH 29.7 06/27/2021 2104  ? MCHC 34.5 06/27/2021 2104  ? RDW 13.4 06/27/2021 2104  ? RDW 13.2 01/28/2021 0953  ? LYMPHSABS 2.3 01/28/2021 0953  ? MONOABS 0.3 10/02/2017 1139  ? EOSABS 0.1 01/28/2021 0953  ? BASOSABS 0.0 01/28/2021 0953  ? ? ?Lab Results  ?Component Value Date  ? HGBA1C 5.2 01/28/2021  ? ? ?Assessment & Plan:  ?1. Acquired hypothyroidism ?Last thyroid panel was normal ?We will check again today and adjust regimen accordingly ?- T4, free ?- TSH ?- Basic Metabolic Panel ? ?2. Costochondritis ?Reproducible left-sided chest pain ?Advised to use NSAIDs if this occurs ?Underlying anxiety could also be playing a role but she declines use of anxiolytics stating she will reach out in the event that she needs a medication ? ?3. Need for hepatitis C screening test ?- HCV Ab w Reflex to Quant PCR ? ? ?Health Care Maintenance: Up-to-date Pap smear from 06/2021 ?No orders of the defined types were placed in this encounter. ? ? ?Follow-up: Return in about 6 months (around 06/09/2022) for Chronic medical conditions.  ? ? ? ? ? ?08/09/2022, MD, FAAFP. ?[facility] ?[place], [place] ?[phone]   ?12/07/2021, 9:34 AM ?

## 2021-12-07 NOTE — Patient Instructions (Signed)
Costocondritis Costochondritis La costocondritis es la irritacin e hinchazn (inflamacin) del tejido que une las costillas con el esternn. Este tejido se llama cartlago. La costocondritis causa dolor en la parte delantera del pecho. Por lo general, el dolor tiene las siguientes caractersticas: Comienza lentamente. Se manifiesta en ms de una costilla. Cules son las causas? No siempre se conoce la causa exacta de esta afeccin. Es el resultado de una sobrecarga en el tejido de la zona afectada. La causa de esta sobrecarga puede ser la siguiente: Lesin torcica. Ejercicios o actividades relacionadas con levantar pesos. Tos muy intensa. Qu incrementa el riesgo? Es ms probable que tenga esta afeccin si: Es mujer. Tiene entre 30 y 40 aos. Comenz un nuevo ejercicio o una nueva actividad recientemente. Tiene niveles bajos de vitamina D. Tiene una afeccin que lo hace toser con frecuencia. Cules son los signos o sntomas? El sntoma principal de esta afeccin es el dolor en el pecho. El dolor: Por lo general, comienza de manera lenta y puede ser penetrante o sordo. Empeora al respirar, toser o hacer ejercicio. Mejora con el reposo. Puede empeorar al presionar la zona afectada de las costillas y el esternn. Cmo se trata? Generalmente, esta afeccin desaparece con el paso tiempo, sin tratamiento. El mdico puede recetarle un antiinflamatorio no esteroideo (AINE), como ibuprofeno. Esto puede ayudar a reducir la inflamacin y el dolor. El tratamiento tambin puede incluir lo siguiente: Hacer reposo y evitar las actividades que empeoren el dolor. Aplicar calor o hielo sobre la zona dolorida. Hacer ejercicios para elongar los msculos del pecho. Si estos tratamientos no ayudan, el mdico puede inyectarle un anestsico para aliviar el dolor. Siga estas instrucciones en su casa: Control del dolor, la rigidez y la hinchazn   Si se lo indican, aplique hielo sobre la zona dolorida.  Para hacer esto: Ponga el hielo en una bolsa plstica. Coloque una toalla entre la piel y la bolsa. Aplique el hielo durante 20 minutos, 2 a 3 veces por da. Si se lo indican, aplique calor en la zona afectada. Hgalo con la frecuencia que le haya indicado el mdico. Use la fuente de calor que el mdico le recomiende, como una compresa de calor hmedo o una almohadilla trmica. Coloque una toalla entre la piel y la fuente de calor. Aplique calor durante 20 a 30 minutos. Retire la fuente de calor si la piel se le pone de color rojo brillante. Esto es muy importante si no puede sentir el dolor, el calor o el fro. Puede correr un riesgo mayor de sufrir quemaduras. Actividad Haga reposo como se lo haya indicado el mdico. No haga nada que intensifique el dolor. Esto incluye cualquier actividad que involucre los msculos del pecho, del vientre (abdomen) y los laterales. No levante ningn objeto que pese ms de 10 libras (4.5 kg) o el lmite de peso que le indiquen hasta que el mdico le diga que puede hacerlo. Retome sus actividades normales segn lo indicado por el mdico. Pregntele al mdico qu actividades son seguras para usted. Instrucciones generales Tome los medicamentos de venta libre y los recetados solamente como se lo haya indicado el mdico. Concurra a todas las visitas de seguimiento como se lo haya indicado el mdico. Esto es importante. Comunquese con un mdico si: Siente escalofros o tiene fiebre. El dolor persiste o empeora. Tiene tos que no desaparece. Solicite ayuda de inmediato si: Le falta el aire. Siente dolor de pecho muy fuerte que no mejora con medicamentos, calor ni hielo. Estos sntomas pueden   indicar una emergencia. No espere a ver si los sntomas desaparecen. Solicite atencin mdica de inmediato. Comunquese con el servicio de emergencias de su localidad (911 en los [place]). No conduzca por sus propios medios hasta el hospital. Resumen La costocondritis  es la irritacin e hinchazn (inflamacin) del tejido que une las costillas con el esternn. Esta afeccin causa dolor en la parte frontal del pecho. El tratamiento puede incluir medicamentos, reposo, calor o hielo, y ejercicios. Esta informacin no tiene como fin reemplazar el consejo del mdico. Asegrese de hacerle al mdico cualquier pregunta que tenga. Document Revised: 10/25/2019 Document Reviewed: 10/25/2019 Elsevier Patient Education  2022 Elsevier Inc.  

## 2021-12-08 ENCOUNTER — Other Ambulatory Visit: Payer: Self-pay | Admitting: Family Medicine

## 2021-12-08 ENCOUNTER — Other Ambulatory Visit: Payer: Self-pay

## 2021-12-08 DIAGNOSIS — E039 Hypothyroidism, unspecified: Secondary | ICD-10-CM

## 2021-12-08 LAB — BASIC METABOLIC PANEL
BUN/Creatinine Ratio: 14 (ref 9–23)
BUN: 10 mg/dL (ref 6–20)
CO2: 20 mmol/L (ref 20–29)
Calcium: 9.4 mg/dL (ref 8.7–10.2)
Chloride: 103 mmol/L (ref 96–106)
Creatinine, Ser: 0.69 mg/dL (ref 0.57–1.00)
Glucose: 83 mg/dL (ref 70–99)
Potassium: 4.2 mmol/L (ref 3.5–5.2)
Sodium: 139 mmol/L (ref 134–144)
eGFR: 115 mL/min/{1.73_m2} (ref >59)

## 2021-12-08 LAB — HCV INTERPRETATION

## 2021-12-08 LAB — T4, FREE: Free T4: 1.22 ng/dL (ref 0.82–1.77)

## 2021-12-08 LAB — HCV AB W REFLEX TO QUANT PCR: HCV Ab: NONREACTIVE

## 2021-12-08 LAB — TSH: TSH: 3.09 u[IU]/mL (ref 0.450–4.500)

## 2021-12-08 MED ORDER — LEVOTHYROXINE SODIUM 25 MCG PO TABS
25.0000 ug | ORAL_TABLET | Freq: Every day | ORAL | 1 refills | Status: DC
  Filled 2021-12-08 – 2021-12-30 (×2): qty 90, 90d supply, fill #0
  Filled 2022-04-28: qty 90, 90d supply, fill #1

## 2021-12-29 ENCOUNTER — Encounter (HOSPITAL_COMMUNITY): Payer: Self-pay

## 2021-12-29 ENCOUNTER — Ambulatory Visit (HOSPITAL_COMMUNITY)
Admission: EM | Admit: 2021-12-29 | Discharge: 2021-12-29 | Disposition: A | Discharge status: Home / Self Care | Payer: Self-pay | Attending: Nurse Practitioner | Admitting: Nurse Practitioner

## 2021-12-29 DIAGNOSIS — M542 Cervicalgia: Secondary | ICD-10-CM

## 2021-12-29 HISTORY — DX: Type 2 diabetes mellitus without complications: E11.9

## 2021-12-29 MED ORDER — CYCLOBENZAPRINE HCL 10 MG PO TABS
10.0000 mg | ORAL_TABLET | Freq: Two times a day (BID) | ORAL | 0 refills | Status: DC | PRN
  Filled 2021-12-29: qty 20, 10d supply, fill #0

## 2021-12-29 MED ORDER — KETOROLAC TROMETHAMINE 30 MG/ML IJ SOLN
INTRAMUSCULAR | Status: AC
  Filled 2021-12-29: qty 1

## 2021-12-29 MED ORDER — KETOROLAC TROMETHAMINE 30 MG/ML IJ SOLN
30.0000 mg | Freq: Once | INTRAMUSCULAR | Status: AC
  Administered 2021-12-29: 30 mg via INTRAMUSCULAR

## 2021-12-29 NOTE — ED Provider Notes (Signed)
?[facility] ? ? ? ?CSN: [id number] ?Arrival date & time: 12/29/21  1508 ? ? ?  ? ?History   ?Chief Complaint ?Chief Complaint  ?Patient presents with  ? Neck Pain  ? ? ?HPI ?[name] is a 36 y.o. female.  ? ?Patient presents with son and medical interpreter, Angela Nevin.  Reports neck pain on the left side of her neck that started suddenly Sunday morning when she woke up.  She denies any recent accident, trauma, fall, injury to her neck.  She reports the pain is sharp and is severe-10 out of 10.  The pain is not radiating down her arm.  Moving her head makes it worse.  She has tried rest, ice, heat, ibuprofen without relief of symptoms.  She denies any weakness, headaches, dizziness, numbness or tingling in her fingers, fevers. ? ? ? ? ?Past Medical History:  ?Diagnosis Date  ? Diabetes mellitus without complication ([place])   ? ? ? ?OB History   ?No obstetric history on file. ?  ? ? ? ?Home Medications   ? ?Prior to Admission medications   ?Medication Sig Start Date End Date Taking? Authorizing Provider  ?cyclobenzaprine (FLEXERIL) 10 MG tablet Take 1 tablet (10 mg total) by mouth 2 (two) times daily as needed for muscle spasms. Do not take while driving or operating heavy machinery 12/29/21  Yes Eulogio Bear, NP  ? ? ?Family History ?No family history on file. ? ?Social History ?Social History  ? ?Tobacco Use  ? Smoking status: Never  ? Smokeless tobacco: Never  ?Substance Use Topics  ? Alcohol use: Never  ? Drug use: Never  ? ? ? ?Allergies   ?Patient has no allergy information on record. ? ? ?Review of Systems ?Review of Systems ?Per HPI ? ?Physical Exam ?Triage Vital Signs ?ED Triage Vitals  ?Enc Vitals Group  ?   BP 12/29/21 1813 134/90  ?   Pulse Rate 12/29/21 1813 93  ?   Resp 12/29/21 1813 18  ?   Temp 12/29/21 1813 98.3 ?F (36.8 ?C)  ?   Temp Source 12/29/21 1813 Oral  ?   SpO2 12/29/21 1813 96 %  ?   Weight --   ?   Height --   ?   Head Circumference --   ?   Peak Flow --   ?   Pain  Score 12/29/21 1808 10  ?   Pain Loc --   ?   Pain Edu? --   ?   Excl. in [place]? --   ? ?No data found. ? ?Updated Vital Signs ?BP 134/90 (BP Location: Right Arm)   Pulse 93   Temp 98.3 ?F (36.8 ?C) (Oral)   Resp 18   LMP 12/23/2021   SpO2 96%  ? ?Visual Acuity ?Right Eye Distance:   ?Left Eye Distance:   ?Bilateral Distance:   ? ?Right Eye Near:   ?Left Eye Near:    ?Bilateral Near:    ? ?Physical Exam ?Vitals and nursing note reviewed.  ?Constitutional:   ?   General: She is not in acute distress. ?   Appearance: Normal appearance. She is not toxic-appearing.  ?HENT:  ?   Head: Normocephalic and atraumatic.  ?Eyes:  ?   General: No scleral icterus.    ?   Right eye: No discharge.     ?   Left eye: No discharge.  ?   Extraocular Movements: Extraocular movements intact.  ?  Pupils: Pupils are equal, round, and reactive to light.  ?Neck:  ? ?   Comments: Ttp in the areas marked.  No erythema, swelling, obvious deformity ?Cardiovascular:  ?   Rate and Rhythm: Normal rate and regular rhythm.  ?Pulmonary:  ?   Effort: Pulmonary effort is normal. No respiratory distress.  ?   Breath sounds: Normal breath sounds. No wheezing, rhonchi or rales.  ?Musculoskeletal:  ?   Cervical back: Normal range of motion and neck supple. Tenderness present. No rigidity. Pain with movement and muscular tenderness present. Normal range of motion.  ?Lymphadenopathy:  ?   Cervical: No cervical adenopathy.  ?Skin: ?   General: Skin is warm and dry.  ?   Coloration: Skin is not jaundiced or pale.  ?   Findings: No erythema.  ?Neurological:  ?   Mental Status: She is alert and oriented to person, place, and time.  ?   Motor: No weakness.  ?   Gait: Gait normal.  ?Psychiatric:     ?   Mood and Affect: Mood normal.     ?   Behavior: Behavior normal.  ? ? ? ?UC Treatments / Results  ?Labs ?(all labs ordered are listed, but only abnormal results are displayed) ?Labs Reviewed - No data to display ? ?EKG ? ? ?Radiology ?No results  found. ? ?Procedures ?Procedures (including critical care time) ? ?Medications Ordered in UC ?Medications  ?ketorolac (TORADOL) 30 MG/ML injection 30 mg (has no administration in time range)  ? ? ?Initial Impression / Assessment and Plan / UC Course  ?I have reviewed the triage vital signs and the nursing notes. ? ?Pertinent labs & imaging results that were available during my care of the patient were reviewed by me and considered in my medical decision making (see chart for details). ? ?  ? ?Suspect neck pain is from a muscular origin.  No red flags in history or on examination today.  Will given Toradol 30 mg IM today in office.  Can take Tylenol for the next 24 hours for pain.  If pain is unrelieved with Toradol, she can use muscle relaxant at night time to help relax muscles.  Follow up if symptoms do not improve.   ? ?Final Clinical Impressions(s) / UC Diagnoses  ? ?Final diagnoses:  ?Neck pain  ? ? ? ?Discharge Instructions   ? ?  ?- We have given you an injection of Toradol 30 mg IM today - this will help with inflammation  ?- You can use Tylenol for the next 24 hours, please avoid NSAIDs for the next 24 hours (ibuprofen, Advil, Aleve, BC goodies) ?- You can also use the cyclobenzaprine 10 mg at night time as needed for muscle pain. ? ? ? ? ?ED Prescriptions   ? ? Medication Sig Dispense Auth. Provider  ? cyclobenzaprine (FLEXERIL) 10 MG tablet Take 1 tablet (10 mg total) by mouth 2 (two) times daily as needed for muscle spasms. Do not take while driving or operating heavy machinery 20 tablet Eulogio Bear, NP  ? ?  ? ?PDMP not reviewed this encounter. ?  ?Eulogio Bear, NP ?12/29/21 1900 ? ?

## 2021-12-29 NOTE — Discharge Instructions (Addendum)
-   We have given you an injection of Toradol 30 mg IM today - this will help with inflammation  ?- You can use Tylenol for the next 24 hours, please avoid NSAIDs for the next 24 hours (ibuprofen, Advil, Aleve, BC goodies) ?- You can also use the cyclobenzaprine 10 mg at night time as needed for muscle pain. ?

## 2021-12-29 NOTE — ED Triage Notes (Signed)
Pt presents today with neck pain that started on Sunday. Pt states it hurts to move neck around and it is tender. Pt has taken ibuprofen to alleviate pain, no improvement.  ?

## 2021-12-30 ENCOUNTER — Other Ambulatory Visit: Payer: Self-pay

## 2021-12-30 ENCOUNTER — Encounter: Payer: Self-pay | Admitting: Family Medicine

## 2022-04-15 ENCOUNTER — Other Ambulatory Visit: Payer: Self-pay

## 2022-04-28 ENCOUNTER — Other Ambulatory Visit: Payer: Self-pay

## 2022-05-31 ENCOUNTER — Other Ambulatory Visit: Payer: Self-pay

## 2022-06-09 ENCOUNTER — Ambulatory Visit: Payer: Self-pay | Attending: Family Medicine | Admitting: Family Medicine

## 2022-06-09 ENCOUNTER — Other Ambulatory Visit: Payer: Self-pay | Admitting: Family Medicine

## 2022-06-09 ENCOUNTER — Other Ambulatory Visit: Payer: Self-pay

## 2022-06-09 ENCOUNTER — Encounter: Payer: Self-pay | Admitting: Family Medicine

## 2022-06-09 VITALS — BP 109/76 | HR 82 | Temp 98.4°F | Ht 63.0 in | Wt 147.6 lb

## 2022-06-09 DIAGNOSIS — N96 Recurrent pregnancy loss: Secondary | ICD-10-CM

## 2022-06-09 DIAGNOSIS — E039 Hypothyroidism, unspecified: Secondary | ICD-10-CM

## 2022-06-09 DIAGNOSIS — Z23 Encounter for immunization: Secondary | ICD-10-CM

## 2022-06-09 DIAGNOSIS — R1032 Left lower quadrant pain: Secondary | ICD-10-CM

## 2022-06-09 DIAGNOSIS — Z13228 Encounter for screening for other metabolic disorders: Secondary | ICD-10-CM

## 2022-06-09 NOTE — Progress Notes (Signed)
Subjective:  Patient ID: [name], female    DOB: 07/18/1986  Age: 36 y.o. MRN: [id number]  CC: Hypothyroidism   HPI Raelene Trew La Holleigh Crihfield is a 36 y.o. year old female with a history of hypothyroidism here for an office visit.  Interval History: Adherent with Levothyroxine for Hypothyroidism. Last thyroid panel was normal. She is requesting GI referral for LLQ pain, previously followed by Dr Bosie Clos.  She endorses presence of constipation which has not improved with fiber intake.  Her med list reveals presence of Linzess but she informs me she only received samples of this and would like to be referred back to GI to see what is going on.  Also requests GYN referral due to presence of dysmenorrhea and history of two miscarriages. Past Medical History:  Diagnosis Date   Bulimia nervosa    Diabetes mellitus without complication (HCC)    GERD (gastroesophageal reflux disease)    Hyperthyroidism     Past Surgical History:  Procedure Laterality Date   COLONOSCOPY WITH PROPOFOL N/A 03/18/2016   Procedure: COLONOSCOPY WITH PROPOFOL;  Surgeon: Vida Rigger, MD;  Location: [facility];  Service: Endoscopy;  Laterality: N/A;   ESOPHAGOGASTRODUODENOSCOPY (EGD) WITH PROPOFOL N/A 09/22/2017   Procedure: ESOPHAGOGASTRODUODENOSCOPY (EGD) WITH PROPOFOL;  Surgeon: Vida Rigger, MD;  Location: [facility];  Service: Endoscopy;  Laterality: N/A;    Family History  Problem Relation Age of Onset   Diabetes Father    Hearing loss Neg Hx    Cancer Neg Hx    Heart failure Neg Hx     Social History   Socioeconomic History   Marital status: Single    Spouse name: Not on file   Number of children: Not on file   Years of education: Not on file   Highest education level: Not on file  Occupational History   Not on file  Tobacco Use   Smoking status: Never   Smokeless tobacco: Never  Vaping Use   Vaping Use: Never used  Substance and Sexual Activity   Alcohol use: Never   Drug  use: Never   Sexual activity: Yes    Birth control/protection: None  Other Topics Concern   Not on file  Social History Narrative   ** Merged History Encounter **       ** Merged History Encounter **       Lives in [place] since 2005 with husband and 2 children (2011, 2008). Moved from [place]. Spanish speaking.    Social Determinants of Health   Financial Resource Strain: Not on file  Food Insecurity: No Food Insecurity (08/20/2021)   Hunger Vital Sign    Worried About Running Out of Food in the Last Year: Never true    Ran Out of Food in the Last Year: Never true  Transportation Needs: No Transportation Needs (08/20/2021)   PRAPARE - Administrator, Civil Service (Medical): No    Lack of Transportation (Non-Medical): No  Physical Activity: Not on file  Stress: Not on file  Social Connections: Not on file    No Known Allergies  Outpatient Medications Prior to Visit  Medication Sig Dispense Refill   levothyroxine (SYNTHROID) 25 MCG tablet Take 1 tablet (25 mcg total) by mouth daily. 90 tablet 1   dicyclomine (BENTYL) 10 MG capsule 1 capsule     linaclotide (LINZESS) 290 MCG CAPS capsule Take 1 capsule (290 mcg total) by mouth once for 1 dose. 1 capsule  0   cyclobenzaprine (FLEXERIL) 10 MG tablet Take 1 tablet (10 mg total) by mouth 2 (two) times daily as needed for muscle spasms. Do not take while driving or operating heavy machinery 20 tablet 0   No facility-administered medications prior to visit.     ROS Review of Systems  Constitutional:  Negative for activity change, appetite change and fatigue.  HENT:  Negative for congestion, sinus pressure and sore throat.   Eyes:  Negative for visual disturbance.  Respiratory:  Negative for cough, chest tightness, shortness of breath and wheezing.   Cardiovascular:  Negative for chest pain and palpitations.  Gastrointestinal:  Positive for abdominal pain and constipation. Negative for abdominal  distention.  Endocrine: Negative for polydipsia.  Genitourinary:  Negative for dysuria and frequency.  Musculoskeletal:  Negative for arthralgias and back pain.  Skin:  Negative for rash.  Neurological:  Negative for tremors, light-headedness and numbness.  Hematological:  Does not bruise/bleed easily.  Psychiatric/Behavioral:  Negative for agitation and behavioral problems.     Objective:  BP 109/76   Pulse 82   Temp 98.4 F (36.9 C) (Oral)   Ht 5\' 3"  (1.6 m)   Wt 147 lb 9.6 oz (67 kg)   SpO2 99%   BMI 26.15 kg/m      06/09/2022    9:38 AM 12/29/2021    6:13 PM 12/07/2021    9:13 AM  BP/Weight  Systolic BP 109 134 110  Diastolic BP 76 90 77  Wt. (Lbs) 147.6  154  BMI 26.15 kg/m2  27.28 kg/m2      Physical Exam Constitutional:      Appearance: She is well-developed.  Cardiovascular:     Rate and Rhythm: Normal rate.     Heart sounds: Normal heart sounds. No murmur heard. Pulmonary:     Effort: Pulmonary effort is normal.     Breath sounds: Normal breath sounds. No wheezing or rales.  Chest:     Chest wall: No tenderness.  Abdominal:     General: Bowel sounds are normal. There is no distension.     Palpations: Abdomen is soft. There is no mass.     Tenderness: There is no abdominal tenderness.  Musculoskeletal:        General: Normal range of motion.     Right lower leg: No edema.     Left lower leg: No edema.  Neurological:     Mental Status: She is alert and oriented to person, place, and time.  Psychiatric:        Mood and Affect: Mood normal.        Latest Ref Rng & Units 12/07/2021    9:44 AM 01/28/2021    9:53 AM 08/06/2020    3:57 PM  CMP  Glucose 70 - 99 mg/dL 83  88  89   BUN 6 - 20 mg/dL 10  9  11    Creatinine 0.57 - 1.00 mg/dL 13/01/2020   2.50   Sodium 134 - 144 mmol/L 139  139  139   Potassium 3.5 - 5.2 mmol/L 4.2  4.1  4.3   Chloride 96 - 106 mmol/L 103  102  102   CO2 20 - 29 mmol/L 20  19  25    Calcium 8.7 - 10.2 mg/dL 9.4  9.6  9.6    Total Protein 6.0 - 8.5 g/dL   7.2   Total Bilirubin 0.0 - 1.2 mg/dL   0.2   Alkaline Phos 44 - 121 IU/L  92   AST 0 - 40 IU/L   20   ALT 0 - 32 IU/L   18     Lipid Panel  No results found for: "CHOL", "TRIG", "HDL", "CHOLHDL", "VLDL", "LDLCALC", "LDLDIRECT"  CBC    Component Value Date/Time   WBC 8.2 06/27/2021 2104   RBC 4.54 06/27/2021 2104   HGB 13.5 06/27/2021 2104   HGB 14.5 01/28/2021 0953   HCT 39.1 06/27/2021 2104   HCT 42.6 01/28/2021 0953   PLT 365 06/27/2021 2104   PLT 424 01/28/2021 0953   MCV 86.1 06/27/2021 2104   MCV 88 01/28/2021 0953   MCH 29.7 06/27/2021 2104   MCHC 34.5 06/27/2021 2104   RDW 13.4 06/27/2021 2104   RDW 13.2 01/28/2021 0953   LYMPHSABS 2.3 01/28/2021 0953   MONOABS 0.3 10/02/2017 1139   EOSABS 0.1 01/28/2021 0953   BASOSABS 0.0 01/28/2021 0953    Lab Results  Component Value Date   HGBA1C 5.2 01/28/2021    Lab Results  Component Value Date   TSH 3.090 12/07/2021    Assessment & Plan:  1. Acquired hypothyroidism Normal We will check thyroid panel and adjust regimen accordingly - T3, free - T4, free - TSH  2. History of recurrent miscarriages - Ambulatory referral to Gynecology  3. Left lower quadrant abdominal pain With associated constipation It appears she does have Linzess at the pharmacy and has been advised to pick this up She would rather I refer her back to GI and I have placed referral back to Dr. Estrella Deeds of [facility] whom she saw previously Counseled on increasing fiber intake, fruits and vegetable, limit intake of foods like cheese, white bread, white rice - Ambulatory referral to Gastroenterology  4. Screening for metabolic disorder - LP+Non-HDL Cholesterol  5.  Flu shot administered  No orders of the defined types were placed in this encounter.   Follow-up: Return in about 6 months (around 12/08/2022) for Hypothyroidism.       [name], MD, FAAFP. [facility]  [place], [place] [phone]   06/09/2022, 10:06 AM

## 2022-06-09 NOTE — Patient Instructions (Signed)
Dolor abdominal en los adultos Abdominal Pain, Adult El dolor en el abdomen (dolor abdominal) puede tener muchas causas. A menudo, el dolor abdominal no es grave y [place] sin tratamiento o con tratamiento en la casa. Sin embargo, a Facilities manager abdominal es grave. El mdico le har preguntas sobre sus antecedentes mdicos y le har un examen fsico para tratar de Production assistant, radio causa del dolor abdominal. Siga estas instrucciones en su casa:  Medicamentos Use los medicamentos de venta libre y los recetados solamente como se lo haya indicado el mdico. No tome un laxante a menos que se lo haya indicado el mdico. Instrucciones generales Controle su afeccin para detectar cualquier cambio. Beba suficiente lquido como para Pharmacologist la orina de color amarillo plido. Concurra a todas las visitas de [address] se lo haya indicado el mdico. Esto es importante. Comunquese con un mdico si: El dolor abdominal cambia o empeora. No tiene apetito o baja de peso sin proponrselo. Est estreido o tiene diarrea durante ms de 2 o 3 das. Tiene dolor cuando orina o defeca. El dolor abdominal lo despierta de noche. El dolor empeora con las comidas, despus de comer o con determinados alimentos. Tiene vmitos y no puede retener nada de lo que ingiere. Tiene fiebre. Observa sangre en la orina. Solicite ayuda inmediatamente si: El dolor no desaparece tan pronto como el mdico le dijo que era esperable. No puede dejar de vomitar. El Engineer, mining se siente solo en zonas del abdomen, como el lado derecho o la parte inferior izquierda del abdomen. Si se localiza en la zona derecha, posiblemente podra tratarse de apendicitis. Las heces son sanguinolentas o de color negro, o de aspecto alquitranado. Tiene dolor intenso, clicos o distensin abdominal. Tiene signos de deshidratacin, como los siguientes: Orina de color oscuro, muy escasa o falta de orina. Labios agrietados. Sequedad de boca. Ojos  hundidos. Somnolencia. Debilidad. Tiene dificultad para respirar o Journalist, newspaper. Resumen A menudo, el dolor abdominal no es grave y [place] sin tratamiento o con tratamiento en la casa. Sin embargo, a Facilities manager abdominal es grave. Controle su afeccin para Insurance risk surveyor cambio. Use los medicamentos de venta libre y los recetados solamente como se lo haya indicado el mdico. Comunquese con un mdico si el dolor abdominal cambia o [place]. Busque ayuda de inmediato si tiene dolor intenso, clicos o distensin abdominal. Esta informacin no tiene Theme park manager el consejo del mdico. Asegrese de hacerle al mdico cualquier pregunta que tenga. Document Revised: 03/27/2019 Document Reviewed: 03/27/2019 Elsevier Patient Education  2023 [employer].

## 2022-06-10 ENCOUNTER — Telehealth: Payer: Self-pay

## 2022-06-10 ENCOUNTER — Other Ambulatory Visit: Payer: Self-pay | Admitting: Family Medicine

## 2022-06-10 ENCOUNTER — Other Ambulatory Visit: Payer: Self-pay

## 2022-06-10 DIAGNOSIS — E039 Hypothyroidism, unspecified: Secondary | ICD-10-CM

## 2022-06-10 LAB — T3, FREE: T3, Free: 2.7 pg/mL (ref 2.0–4.4)

## 2022-06-10 LAB — LP+NON-HDL CHOLESTEROL
Cholesterol, Total: 208 mg/dL — ABNORMAL HIGH (ref 100–199)
HDL: 55 mg/dL (ref >39)
LDL Chol Calc (NIH): 133 mg/dL — ABNORMAL HIGH (ref 0–99)
Total Non-HDL-Chol (LDL+VLDL): 153 mg/dL — ABNORMAL HIGH (ref 0–129)
Triglycerides: 113 mg/dL (ref 0–149)
VLDL Cholesterol Cal: 20 mg/dL (ref 5–40)

## 2022-06-10 LAB — TSH: TSH: 2.67 u[IU]/mL (ref 0.450–4.500)

## 2022-06-10 LAB — T4, FREE: Free T4: 1.38 ng/dL (ref 0.82–1.77)

## 2022-06-10 MED ORDER — LEVOTHYROXINE SODIUM 25 MCG PO TABS
25.0000 ug | ORAL_TABLET | Freq: Every day | ORAL | 1 refills | Status: DC
  Filled 2022-06-10 – 2022-08-03 (×2): qty 90, 90d supply, fill #0
  Filled 2022-11-23: qty 90, 90d supply, fill #1

## 2022-06-10 NOTE — Telephone Encounter (Signed)
Pt called with interpreter [phone] to get lab results. Results and provider comments  given. Pt was very surprised because she states she never eats any high cholesterol food. We reviewed some foods high in cholesterol and she denies eating them. She used to be bulimic and high chol does not run in her family. She is not overweight. Pt requests a list of high chol foods be mailed or put on MyChart in Spanish and she would like to have the chol lab redrawn.

## 2022-06-10 NOTE — Telephone Encounter (Signed)
[name], MD  06/10/2022 11:25 AM EDT     Please inform her that her thyroid is normal, cholesterol is slightly elevated.  She needs to work on a low-cholesterol diet and exercise.  No medication is indicated at this time.   This is the information and provider comments used for this call.

## 2022-06-13 NOTE — Telephone Encounter (Signed)
Noted  

## 2022-06-27 ENCOUNTER — Other Ambulatory Visit (HOSPITAL_COMMUNITY): Payer: Self-pay

## 2022-06-27 MED ORDER — DICYCLOMINE HCL 10 MG PO CAPS
10.0000 mg | ORAL_CAPSULE | Freq: Three times a day (TID) | ORAL | 11 refills | Status: DC | PRN
  Filled 2022-06-27: qty 90, 30d supply, fill #0

## 2022-07-20 ENCOUNTER — Other Ambulatory Visit: Payer: Self-pay

## 2022-07-20 ENCOUNTER — Encounter (HOSPITAL_COMMUNITY): Payer: Self-pay | Admitting: *Deleted

## 2022-07-20 ENCOUNTER — Inpatient Hospital Stay (HOSPITAL_COMMUNITY)
Admission: AD | Admit: 2022-07-20 | Discharge: 2022-07-20 | Disposition: A | Discharge status: Home / Self Care | Payer: Self-pay | Attending: Obstetrics & Gynecology | Admitting: Obstetrics & Gynecology

## 2022-07-20 ENCOUNTER — Inpatient Hospital Stay (HOSPITAL_COMMUNITY): Payer: Self-pay

## 2022-07-20 DIAGNOSIS — O99611 Diseases of the digestive system complicating pregnancy, first trimester: Secondary | ICD-10-CM | POA: Insufficient documentation

## 2022-07-20 DIAGNOSIS — O09521 Supervision of elderly multigravida, first trimester: Secondary | ICD-10-CM | POA: Insufficient documentation

## 2022-07-20 DIAGNOSIS — Z3491 Encounter for supervision of normal pregnancy, unspecified, first trimester: Secondary | ICD-10-CM

## 2022-07-20 DIAGNOSIS — M545 Low back pain, unspecified: Secondary | ICD-10-CM | POA: Insufficient documentation

## 2022-07-20 DIAGNOSIS — Z3A01 Less than 8 weeks gestation of pregnancy: Secondary | ICD-10-CM | POA: Insufficient documentation

## 2022-07-20 DIAGNOSIS — O98811 Other maternal infectious and parasitic diseases complicating pregnancy, first trimester: Secondary | ICD-10-CM | POA: Insufficient documentation

## 2022-07-20 DIAGNOSIS — B3731 Acute candidiasis of vulva and vagina: Secondary | ICD-10-CM | POA: Insufficient documentation

## 2022-07-20 DIAGNOSIS — O26891 Other specified pregnancy related conditions, first trimester: Secondary | ICD-10-CM | POA: Insufficient documentation

## 2022-07-20 DIAGNOSIS — O23592 Infection of other part of genital tract in pregnancy, second trimester: Secondary | ICD-10-CM | POA: Insufficient documentation

## 2022-07-20 DIAGNOSIS — K219 Gastro-esophageal reflux disease without esophagitis: Secondary | ICD-10-CM | POA: Insufficient documentation

## 2022-07-20 DIAGNOSIS — K5900 Constipation, unspecified: Secondary | ICD-10-CM | POA: Insufficient documentation

## 2022-07-20 DIAGNOSIS — O98819 Other maternal infectious and parasitic diseases complicating pregnancy, unspecified trimester: Secondary | ICD-10-CM

## 2022-07-20 HISTORY — DX: Dermatitis, unspecified: L30.9

## 2022-07-20 HISTORY — DX: Calculus of kidney: N20.0

## 2022-07-20 LAB — CBC
HCT: 39.6 % (ref 36.0–46.0)
Hemoglobin: 13.7 g/dL (ref 12.0–15.0)
MCH: 29.1 pg (ref 26.0–34.0)
MCHC: 34.6 g/dL (ref 30.0–36.0)
MCV: 84.1 fL (ref 80.0–100.0)
Platelets: 387 10*3/uL (ref 150–400)
RBC: 4.71 MIL/uL (ref 3.87–5.11)
RDW: 13.2 % (ref 11.5–15.5)
WBC: 6.4 10*3/uL (ref 4.0–10.5)
nRBC: 0 % (ref 0.0–0.2)

## 2022-07-20 LAB — URINALYSIS, ROUTINE W REFLEX MICROSCOPIC
Bilirubin Urine: NEGATIVE
Glucose, UA: NEGATIVE mg/dL
Hgb urine dipstick: NEGATIVE
Ketones, ur: 5 mg/dL — AB
Nitrite: NEGATIVE
Protein, ur: NEGATIVE mg/dL
Specific Gravity, Urine: 1.016 (ref 1.005–1.030)
pH: 5 (ref 5.0–8.0)

## 2022-07-20 LAB — WET PREP, GENITAL
Clue Cells Wet Prep HPF POC: NONE SEEN
Sperm: NONE SEEN
Trich, Wet Prep: NONE SEEN
WBC, Wet Prep HPF POC: >=10 — AB
Yeast Wet Prep HPF POC: NONE SEEN

## 2022-07-20 LAB — HCG, QUANTITATIVE, PREGNANCY: hCG, Beta Chain, Quant, S: 4138 m[IU]/mL — ABNORMAL HIGH (ref <5)

## 2022-07-20 MED ORDER — DOCUSATE SODIUM 250 MG PO CAPS
250.0000 mg | ORAL_CAPSULE | Freq: Every day | ORAL | 2 refills | Status: DC
  Filled 2022-07-20: qty 30, 30d supply, fill #0

## 2022-07-20 MED ORDER — TERCONAZOLE 0.4 % VA CREA
1.0000 | TOPICAL_CREAM | Freq: Every day | VAGINAL | 0 refills | Status: DC
  Filled 2022-07-20: qty 45, 7d supply, fill #0

## 2022-07-20 MED ORDER — FAMOTIDINE 20 MG PO TABS
20.0000 mg | ORAL_TABLET | Freq: Two times a day (BID) | ORAL | 2 refills | Status: DC
  Filled 2022-07-20: qty 30, 15d supply, fill #0

## 2022-07-20 NOTE — MAU Provider Note (Signed)
History     CSN: [id number]  Arrival date and time: 07/20/22 7902  Chief Complaint  Patient presents with   Back Pain   Vaginal Itching   HPI  Sharon is a 36 y.o. [id number] at [redacted]w[redacted]d who presents for evaluation of lower back pain. Patient reports it is lower back cramping that she rates a 5/10. She has not tried anything for the pain.   She denies any vaginal bleeding and leaking of fluid. Denies any constipation, diarrhea or any urinary complaints. She also requests some help with constipation and heartburn. She reports she feels like she has a yeast infection.   OB History     Gravida  6   Para  3   Term  3   Preterm  0   AB  2   Living  3      SAB  2   IAB  0   Ectopic  0   Multiple      Live Births  3           Past Medical History:  Diagnosis Date   Bulimia nervosa    no current problems  (07/2022)   Eczema    GERD (gastroesophageal reflux disease)    Hyperthyroidism    Kidney stone     Past Surgical History:  Procedure Laterality Date   COLONOSCOPY WITH PROPOFOL N/A 03/18/2016   Procedure: COLONOSCOPY WITH PROPOFOL;  Surgeon: Clarene Essex, MD;  Location: [facility];  Service: Endoscopy;  Laterality: N/A;   ESOPHAGOGASTRODUODENOSCOPY (EGD) WITH PROPOFOL N/A 09/22/2017   Procedure: ESOPHAGOGASTRODUODENOSCOPY (EGD) WITH PROPOFOL;  Surgeon: Clarene Essex, MD;  Location: [facility];  Service: Endoscopy;  Laterality: N/A;    Family History  Problem Relation Age of Onset   Healthy Mother    Diabetes Father    Hearing loss Neg Hx    Cancer Neg Hx    Heart failure Neg Hx     Social History   Tobacco Use   Smoking status: Never   Smokeless tobacco: Never  Vaping Use   Vaping Use: Never used  Substance Use Topics   Alcohol use: Never   Drug use: Never    Allergies: No Known Allergies  No medications prior to admission.    Review of Systems  Constitutional: Negative.  Negative for fatigue and fever.  HENT: Negative.     Respiratory: Negative.  Negative for shortness of breath.   Cardiovascular: Negative.  Negative for chest pain.  Gastrointestinal: Negative.  Negative for abdominal pain, constipation, diarrhea, nausea and vomiting.  Genitourinary: Negative.  Negative for dysuria, vaginal bleeding and vaginal discharge.  Musculoskeletal:  Positive for back pain.  Neurological: Negative.  Negative for dizziness and headaches.   Physical Exam   Blood pressure 116/78, pulse 92, temperature 98.5 F (36.9 C), temperature source Oral, resp. rate 16, height 5\' 3"  (1.6 m), weight 64.4 kg, last menstrual period 06/14/2022, SpO2 99 %, unknown if currently breastfeeding.  Patient Vitals for the past 24 hrs:  BP Temp Temp src Pulse Resp SpO2 Height Weight  07/20/22 0817 116/78 98.5 F (36.9 C) Oral 92 16 99 % 5\' 3"  (1.6 m) 64.4 kg    Physical Exam Vitals and nursing note reviewed.  Constitutional:      General: She is not in acute distress.    Appearance: She is well-developed.  HENT:     Head: Normocephalic.  Eyes:     Pupils: Pupils are equal, round, and reactive to  light.  Cardiovascular:     Rate and Rhythm: Normal rate and regular rhythm.     Heart sounds: Normal heart sounds.  Pulmonary:     Effort: Pulmonary effort is normal. No respiratory distress.     Breath sounds: Normal breath sounds.  Abdominal:     General: Bowel sounds are normal. There is no distension.     Palpations: Abdomen is soft.     Tenderness: There is no abdominal tenderness.  Skin:    General: Skin is warm and dry.  Neurological:     Mental Status: She is alert and oriented to person, place, and time.  Psychiatric:        Mood and Affect: Mood normal.        Behavior: Behavior normal.        Thought Content: Thought content normal.        Judgment: Judgment normal.     MAU Course  Procedures  Results for orders placed or performed during the hospital encounter of 07/20/22 (from the past 24 hour(s))  CBC      Status: None   Collection Time: 07/20/22  8:31 AM  Result Value Ref Range   WBC 6.4 4.0 - 10.5 K/uL   RBC 4.71 3.87 - 5.11 MIL/uL   Hemoglobin 13.7 12.0 - 15.0 g/dL   HCT 78.2 95.6 - 21.3 %   MCV 84.1 80.0 - 100.0 fL   MCH 29.1 26.0 - 34.0 pg   MCHC 34.6 30.0 - 36.0 g/dL   RDW 08.6 57.8 - 46.9 %   Platelets 387 150 - 400 K/uL   nRBC 0.0 0.0 - 0.2 %  hCG, quantitative, pregnancy     Status: Abnormal   Collection Time: 07/20/22  8:31 AM  Result Value Ref Range   hCG, Beta Chain, Quant, S 4,138 (H) <5 mIU/mL  Urinalysis, Routine w reflex microscopic Urine, Clean Catch     Status: Abnormal   Collection Time: 07/20/22  8:44 AM  Result Value Ref Range   Color, Urine YELLOW YELLOW   APPearance CLEAR CLEAR   Specific Gravity, Urine 1.016 1.005 - 1.030   pH 5.0 5.0 - 8.0   Glucose, UA NEGATIVE NEGATIVE mg/dL   Hgb urine dipstick NEGATIVE NEGATIVE   Bilirubin Urine NEGATIVE NEGATIVE   Ketones, ur 5 (A) NEGATIVE mg/dL   Protein, ur NEGATIVE NEGATIVE mg/dL   Nitrite NEGATIVE NEGATIVE   Leukocytes,Ua TRACE (A) NEGATIVE   RBC / HPF 0-5 0 - 5 RBC/hpf   WBC, UA 6-10 0 - 5 WBC/hpf   Bacteria, UA RARE (A) NONE SEEN   Squamous Epithelial / LPF 0-5 0 - 5  Wet prep, genital     Status: Abnormal   Collection Time: 07/20/22  8:44 AM   Specimen: Vaginal  Result Value Ref Range   Yeast Wet Prep HPF POC NONE SEEN NONE SEEN   Trich, Wet Prep NONE SEEN NONE SEEN   Clue Cells Wet Prep HPF POC NONE SEEN NONE SEEN   WBC, Wet Prep HPF POC >=10 (A) <10   Sperm NONE SEEN      US OB LESS THAN 14 WEEKS WITH OB TRANSVAGINAL  Result Date: 07/20/2022 CLINICAL DATA:  Back pain EXAM: OBSTETRIC <14 WK [place] AND TRANSVAGINAL OB US TECHNIQUE: Both transabdominal and transvaginal ultrasound examinations were performed for complete evaluation of the gestation as well as the maternal uterus, adnexal regions, and pelvic cul-de-sac. Transvaginal technique was performed to assess early pregnancy. COMPARISON:  None  Available.  FINDINGS: Intrauterine gestational sac: Single Yolk sac:  Visualized. Embryo:  Not Visualized. Cardiac Activity: Not Visualized. Heart Rate: Not applicable MSD: 7.0 mm   5 w   3 d CRL: N/A Subchorionic hemorrhage:  None visualized. Maternal uterus/adnexae: Normal uterus with thickened endometrium, consistent with pregnancy. Normal ovaries, including a right corpus luteum. IMPRESSION: Early intrauterine gestational sac at 5 weeks 3 days' gestation. Electronically Signed   By: Agustin Cree M.D.   On: 07/20/2022 09:35     MDM Labs ordered and reviewed.   UA, UPT CBC, HCG ABO/Rh- O Pos Wet prep and gc/chlamydia US OB Comp Less 14 weeks with Transvaginal   CNM independently reviewed the imaging ordered. Imaging show gestational sac with yolk sac  Discussed repeating ultrasound in 2 weeks for viability. Patient agreeable to plan of care.   Assessment and Plan   1. Normal intrauterine pregnancy on prenatal ultrasound in first trimester   2. Candidiasis of vagina during pregnancy   3. [redacted] weeks gestation of pregnancy   4. Constipation during pregnancy in first trimester   5. Heartburn during pregnancy in first trimester     -Discharge home in stable condition -Rx for terazol, colace and pepcid sent to pharmacy -First trimester precautions discussed -Patient advised to follow-up with [facility] in 2 weeks for viability ultrasound -Patient may return to MAU as needed or if her condition were to change or worsen  Rolm Bookbinder, CNM 07/20/2022, 10:05 AM

## 2022-07-20 NOTE — MAU Note (Signed)
[name] is a 36 y.o. at Unknown here in MAU reporting: preg confirmed yesterday at [facility] paperwork with her, copy made and sent to medical records.  C/o vag itching/burning for 2 wks ago, was told no infection; and back pain.  Instructed to come here as she has hx of miscarriages. LMP: 9/12, irreg since July,had them only lasted 2 days Onset of complaint: back pain 2 wks ago Pain score: 4 Vitals:   07/20/22 0817  BP: 116/78  Pulse: 92  Resp: 16  Temp: 98.5 F (36.9 C)  SpO2: 99%      Lab orders placed from triage:  urine

## 2022-07-20 NOTE — Discharge Instructions (Signed)
Safe Medications in Pregnancy    Acne: Benzoyl Peroxide Salicylic Acid  Backache/Headache: Tylenol: 2 regular strength every 4 hours OR              2 Extra strength every 6 hours  Colds/Coughs/Allergies: Benadryl (alcohol free) 25 mg every 6 hours as needed Breath right strips Claritin Cepacol throat lozenges Chloraseptic throat spray Cold-Eeze- up to three times per day Cough drops, alcohol free Flonase (by prescription only) Guaifenesin Mucinex Robitussin DM (plain only, alcohol free) Saline nasal spray/drops Sudafed (pseudoephedrine) & Actifed ** use only after [redacted] weeks gestation and if you do not have high blood pressure Tylenol Vicks Vaporub Zinc lozenges Zyrtec   Constipation: Colace Ducolax suppositories Fleet enema Glycerin suppositories Metamucil Milk of magnesia Miralax Senokot Smooth move tea  Diarrhea: Kaopectate Imodium A-D  *NO pepto Bismol  Hemorrhoids: Anusol Anusol HC Preparation H Tucks  Indigestion: Tums Maalox Mylanta Zantac  Pepcid  Insomnia: Benadryl (alcohol free) 25mg every 6 hours as needed Tylenol PM Unisom, no Gelcaps  Leg Cramps: Tums MagGel  Nausea/Vomiting:  Bonine Dramamine Emetrol Ginger extract Sea bands Meclizine  Nausea medication to take during pregnancy:  Unisom (doxylamine succinate 25 mg tablets) Take one tablet daily at bedtime. If symptoms are not adequately controlled, the dose can be increased to a maximum recommended dose of two tablets daily (1/2 tablet in the morning, 1/2 tablet mid-afternoon and one at bedtime). Vitamin B6 100mg tablets. Take one tablet twice a day (up to 200 mg per day).  Skin Rashes: Aveeno products Benadryl cream or 25mg every 6 hours as needed Calamine Lotion 1% cortisone cream  Yeast infection: Gyne-lotrimin 7 Monistat 7   **If taking multiple medications, please check labels to avoid duplicating the same active ingredients **take  medication as directed on the label ** Do not exceed 4000 mg of tylenol in 24 hours **Do not take medications that contain aspirin or ibuprofen   Prenatal Care Providers           [facility]  [address] [phone]  [facility] @ [facility]   [address]  [phone]  [facility] @ [place]       [address] [phone]            [facility] @      [address] [phone]          [facility] @ [place]   [address] [phone]  [facility] @ [facility]  [address] [phone]     [facility] @ [facility]  [address]   [phone]     [facility]  Phone: [phone]  [facility]  Phone: [phone]  [facility] Phone: [phone]  [facility] Phone: [phone]  [facility] Phone: [phone]  [facility] Phone: [phone]  [facility]  Phone: [phone]  

## 2022-07-21 ENCOUNTER — Other Ambulatory Visit: Payer: Self-pay

## 2022-07-21 LAB — GC/CHLAMYDIA PROBE AMP (~~LOC~~) NOT AT ARMC
Chlamydia: NEGATIVE
Comment: NEGATIVE
Comment: NORMAL
Neisseria Gonorrhea: NEGATIVE

## 2022-07-26 ENCOUNTER — Other Ambulatory Visit (HOSPITAL_COMMUNITY): Payer: Self-pay

## 2022-08-03 ENCOUNTER — Other Ambulatory Visit: Payer: Self-pay

## 2022-08-03 ENCOUNTER — Ambulatory Visit (INDEPENDENT_AMBULATORY_CARE_PROVIDER_SITE_OTHER): Payer: Self-pay

## 2022-08-03 ENCOUNTER — Ambulatory Visit
Admission: RE | Admit: 2022-08-03 | Discharge: 2022-08-03 | Disposition: A | Discharge status: Home / Self Care | Payer: Self-pay | Source: Ambulatory Visit

## 2022-08-03 VITALS — BP 122/92 | HR 85 | Wt 147.7 lb

## 2022-08-03 DIAGNOSIS — Z712 Person consulting for explanation of examination or test findings: Secondary | ICD-10-CM

## 2022-08-03 DIAGNOSIS — Z3491 Encounter for supervision of normal pregnancy, unspecified, first trimester: Secondary | ICD-10-CM | POA: Insufficient documentation

## 2022-08-03 DIAGNOSIS — Z3689 Encounter for other specified antenatal screening: Secondary | ICD-10-CM | POA: Insufficient documentation

## 2022-08-03 DIAGNOSIS — O99611 Diseases of the digestive system complicating pregnancy, first trimester: Secondary | ICD-10-CM | POA: Insufficient documentation

## 2022-08-03 DIAGNOSIS — R12 Heartburn: Secondary | ICD-10-CM | POA: Insufficient documentation

## 2022-08-03 DIAGNOSIS — O26891 Other specified pregnancy related conditions, first trimester: Secondary | ICD-10-CM | POA: Insufficient documentation

## 2022-08-03 DIAGNOSIS — K5900 Constipation, unspecified: Secondary | ICD-10-CM | POA: Insufficient documentation

## 2022-08-03 DIAGNOSIS — Z3A01 Less than 8 weeks gestation of pregnancy: Secondary | ICD-10-CM | POA: Insufficient documentation

## 2022-08-03 NOTE — Progress Notes (Signed)
Ultrasound Result Visit   Here today for ultrasound results. Results reviewed by Anyanwu, MD who finds single living IUP measuring [redacted]w[redacted]d with EDD of 03/23/23. Provider also finds small subchorionic hemorrhage. Recommends patient begin prenatal vitamin and schedule prenatal care.   Results and provider recommendation reviewed with patient. Patient reports occasional light pain on left side, denies any vaginal bleeding. Return precautions for MAU reviewed. Brief OB history review. Recommended pt begin prenatal vitamin and schedule prenatal care. Pt desires to enroll in [facility]. Appts made and group facilitators notified. Pt will return for in person OB intake. Encounter completed with in person interpreter Eda Royal.  Annabell Howells, RN 08/03/2022  11:38 AM

## 2022-08-09 ENCOUNTER — Inpatient Hospital Stay (HOSPITAL_COMMUNITY): Payer: Self-pay

## 2022-08-09 ENCOUNTER — Other Ambulatory Visit: Payer: Self-pay

## 2022-08-09 ENCOUNTER — Inpatient Hospital Stay (HOSPITAL_COMMUNITY)
Admission: AD | Admit: 2022-08-09 | Discharge: 2022-08-09 | Disposition: A | Discharge status: Home / Self Care | Payer: Self-pay | Attending: Obstetrics and Gynecology | Admitting: Obstetrics and Gynecology

## 2022-08-09 DIAGNOSIS — O99891 Other specified diseases and conditions complicating pregnancy: Secondary | ICD-10-CM | POA: Insufficient documentation

## 2022-08-09 DIAGNOSIS — O209 Hemorrhage in early pregnancy, unspecified: Secondary | ICD-10-CM | POA: Insufficient documentation

## 2022-08-09 DIAGNOSIS — O021 Missed abortion: Secondary | ICD-10-CM

## 2022-08-09 DIAGNOSIS — Z3A08 8 weeks gestation of pregnancy: Secondary | ICD-10-CM | POA: Insufficient documentation

## 2022-08-09 DIAGNOSIS — R300 Dysuria: Secondary | ICD-10-CM | POA: Insufficient documentation

## 2022-08-09 DIAGNOSIS — O09521 Supervision of elderly multigravida, first trimester: Secondary | ICD-10-CM | POA: Insufficient documentation

## 2022-08-09 LAB — HCG, QUANTITATIVE, PREGNANCY: hCG, Beta Chain, Quant, S: 7096 m[IU]/mL — ABNORMAL HIGH (ref <5)

## 2022-08-09 LAB — URINALYSIS, ROUTINE W REFLEX MICROSCOPIC
Bacteria, UA: NONE SEEN
Bilirubin Urine: NEGATIVE
Glucose, UA: NEGATIVE mg/dL
Ketones, ur: NEGATIVE mg/dL
Leukocytes,Ua: NEGATIVE
Nitrite: NEGATIVE
Protein, ur: NEGATIVE mg/dL
Specific Gravity, Urine: 1.003 — ABNORMAL LOW (ref 1.005–1.030)
pH: 6 (ref 5.0–8.0)

## 2022-08-09 LAB — CBC
HCT: 40 % (ref 36.0–46.0)
Hemoglobin: 14.2 g/dL (ref 12.0–15.0)
MCH: 30.5 pg (ref 26.0–34.0)
MCHC: 35.5 g/dL (ref 30.0–36.0)
MCV: 86 fL (ref 80.0–100.0)
Platelets: 429 10*3/uL — ABNORMAL HIGH (ref 150–400)
RBC: 4.65 MIL/uL (ref 3.87–5.11)
RDW: 13.5 % (ref 11.5–15.5)
WBC: 10.8 10*3/uL — ABNORMAL HIGH (ref 4.0–10.5)
nRBC: 0 % (ref 0.0–0.2)

## 2022-08-09 LAB — WET PREP, GENITAL
Clue Cells Wet Prep HPF POC: NONE SEEN
Sperm: NONE SEEN
Trich, Wet Prep: NONE SEEN
WBC, Wet Prep HPF POC: >=10 — AB (ref <10)
Yeast Wet Prep HPF POC: NONE SEEN

## 2022-08-09 MED ORDER — MISOPROSTOL 200 MCG PO TABS
800.0000 ug | ORAL_TABLET | Freq: Four times a day (QID) | ORAL | 0 refills | Status: DC
  Filled 2022-08-09 (×2): qty 4, 1d supply, fill #0

## 2022-08-09 MED ORDER — IBUPROFEN 600 MG PO TABS
600.0000 mg | ORAL_TABLET | Freq: Four times a day (QID) | ORAL | 0 refills | Status: DC | PRN
  Filled 2022-08-09: qty 30, 8d supply, fill #0

## 2022-08-09 NOTE — Progress Notes (Signed)
Milinda Cave, CNM attempting bedside ultrasound.  Reports unable to see anything, will send pt to radiology for ultrasound using vaginal transducer.  Pt verbalized understanding and agreeable.

## 2022-08-09 NOTE — MAU Note (Signed)
[name] is a 36 y.o. at [redacted]w[redacted]d here in MAU reporting: she having lower abdominal cramping and VB that began this morning.  States red VB is wiping only, not passing clots.   Denies recent intercourse.   LMP: NA Onset of complaint: today Pain score: 4 Vitals:   08/09/22 1133  BP: 135/88  Pulse: 87  Resp: 19  Temp: 98.2 F (36.8 C)  SpO2: 100%     FHT:NA Lab orders placed from triage:   None

## 2022-08-09 NOTE — MAU Provider Note (Signed)
History     CSN: [id number]  Arrival date and time: 08/09/22 1124   Event Date/Time   First Provider Initiated Contact with Patient 08/09/22 1152      No chief complaint on file.  [name] is a 36 y.o. [id number] at [redacted]w[redacted]d who receives care at [facility].  She presents today for vaginal bleeding.  She reports the bleeding started today about 0930 this morning.  She states at onset and currently it is only a little with wiping.  She reports it is bright red and without clots.  She denies sexual activity in the past 3 days. She reports some pain with urination that has been present for about 2 weeks.  She states she had testing and was told no infection was present. She also reports abdominal pain that is constant and located bilaterally in her upper abdomen.  She describes it as "it just hurts a little bit."  She states she was not informed that she had a [facility] on her previous ultrasound, but then goes on to state that she was told it was not dangerous or anything.     [place] [id number]   OB History     Gravida  6   Para  3   Term  3   Preterm  0   AB  2   Living  3      SAB  2   IAB  0   Ectopic  0   Multiple      Live Births  3           Past Medical History:  Diagnosis Date   Bulimia nervosa    no current problems  (07/2022)   Eczema    GERD (gastroesophageal reflux disease)    Hyperthyroidism    Kidney stone     Past Surgical History:  Procedure Laterality Date   COLONOSCOPY WITH PROPOFOL N/A 03/18/2016   Procedure: COLONOSCOPY WITH PROPOFOL;  Surgeon: Clarene Essex, MD;  Location[facility];  Service: Endoscopy;  Laterality: N/A;   ESOPHAGOGASTRODUODENOSCOPY (EGD) WITH PROPOFOL N/A 09/22/2017   Procedure: ESOPHAGOGASTRODUODENOSCOPY (EGD) WITH PROPOFOL;  Surgeon: Clarene Essex, MD;  Location: [place];  Service: Endoscopy;  Laterality: N/A;    Family History  Problem Relation Age of Onset   Healthy Mother    Diabetes Father     Hearing loss Neg Hx    Cancer Neg Hx    Heart failure Neg Hx     Social History   Tobacco Use   Smoking status: Never   Smokeless tobacco: Never  Vaping Use   Vaping Use: Never used  Substance Use Topics   Alcohol use: Never   Drug use: Never    Allergies: No Known Allergies  Medications Prior to Admission  Medication Sig Dispense Refill Last Dose   docusate sodium (COLACE) 250 MG capsule Take 1 capsule (250 mg total) by mouth daily. 30 capsule 2    famotidine (PEPCID) 20 MG tablet Take 1 tablet (20 mg total) by mouth 2 (two) times daily. 30 tablet 2    levothyroxine (SYNTHROID) 25 MCG tablet Take 1 tablet (25 mcg total) by mouth daily. 90 tablet 1    Prenatal Vit-Fe Fumarate-FA (PRENATAL VITAMINS PO) Take by mouth.       Review of Systems  Gastrointestinal:  Positive for abdominal pain.  Genitourinary:  Positive for dysuria and vaginal bleeding (Spotting with wiping). Negative for difficulty urinating.   Physical Exam   Blood pressure 135/88,  pulse 87, temperature 98.2 F (36.8 C), temperature source Oral, resp. rate 19, weight 67.3 kg, last menstrual period 06/14/2022, SpO2 100 %.  Physical Exam Vitals reviewed.  Constitutional:      Appearance: Normal appearance.  HENT:     Head: Normocephalic and atraumatic.  Eyes:     Conjunctiva/sclera: Conjunctivae normal.  Cardiovascular:     Rate and Rhythm: Normal rate.  Pulmonary:     Effort: Pulmonary effort is normal. No respiratory distress.  Abdominal:     General: Bowel sounds are normal.  Musculoskeletal:        General: Normal range of motion.  Skin:    General: Skin is warm and dry.  Neurological:     Mental Status: She is alert and oriented to person, place, and time.  Psychiatric:        Mood and Affect: Mood normal.        Behavior: Behavior normal.     MAU Course  Procedures  Results for orders placed or performed during the hospital encounter of 08/09/22 (from the past 24 hour(s))  Wet prep,  genital     Status: Abnormal   Collection Time: 08/09/22 11:48 AM  Result Value Ref Range   Yeast Wet Prep HPF POC NONE SEEN NONE SEEN   Trich, Wet Prep NONE SEEN NONE SEEN   Clue Cells Wet Prep HPF POC NONE SEEN NONE SEEN   WBC, Wet Prep HPF POC >=10 (A) <10   Sperm NONE SEEN   Urinalysis, Routine w reflex microscopic     Status: Abnormal   Collection Time: 08/09/22 12:30 PM  Result Value Ref Range   Color, Urine STRAW (A) YELLOW   APPearance CLEAR CLEAR   Specific Gravity, Urine 1.003 (L) 1.005 - 1.030   pH 6.0 5.0 - 8.0   Glucose, UA NEGATIVE NEGATIVE mg/dL   Hgb urine dipstick SMALL (A) NEGATIVE   Bilirubin Urine NEGATIVE NEGATIVE   Ketones, ur NEGATIVE NEGATIVE mg/dL   Protein, ur NEGATIVE NEGATIVE mg/dL   Nitrite NEGATIVE NEGATIVE   Leukocytes,Ua NEGATIVE NEGATIVE   RBC / HPF 0-5 0 - 5 RBC/hpf   WBC, UA 0-5 0 - 5 WBC/hpf   Bacteria, UA NONE SEEN NONE SEEN   Squamous Epithelial / LPF 0-5 0 - 5  CBC     Status: Abnormal   Collection Time: 08/09/22  1:23 PM  Result Value Ref Range   WBC 10.8 (H) 4.0 - 10.5 K/uL   RBC 4.65 3.87 - 5.11 MIL/uL   Hemoglobin 14.2 12.0 - 15.0 g/dL   HCT 62.9 47.6 - 54.6 %   MCV 86.0 80.0 - 100.0 fL   MCH 30.5 26.0 - 34.0 pg   MCHC 35.5 30.0 - 36.0 g/dL   RDW 50.3 54.6 - 56.8 %   Platelets 429 (H) 150 - 400 K/uL   nRBC 0.0 0.0 - 0.2 %  hCG, quantitative, pregnancy     Status: Abnormal   Collection Time: 08/09/22  1:23 PM  Result Value Ref Range   hCG, Beta Chain, Quant, S 7,096 (H) <5 mIU/mL   US OB Transvaginal  Result Date: 08/09/2022 CLINICAL DATA:  Vaginal bleeding in first trimester of pregnancy, assigned EGA [redacted] weeks 0 days EXAM: TRANSVAGINAL OB ULTRASOUND TECHNIQUE: Transvaginal ultrasound was performed for complete evaluation of the gestation as well as the maternal uterus, adnexal regions, and pelvic cul-de-sac. COMPARISON:  08/03/2022 FINDINGS: Intrauterine gestational sac: Present, single Yolk sac:  Present Embryo:  Present Cardiac  Activity: Absent  Heart Rate: N/A bpm CRL:   10.7 mm   7 w 1 d                  [place] EDC: 03/27/2023 Subchorionic hemorrhage:  None visualized. Maternal uterus/adnexae: Uterus anteverted, otherwise unremarkable. Ovaries normal appearance. No free pelvic fluid or adnexal masses. IMPRESSION: Intrauterine pregnancy identified with crown-rump length corresponding to 7 weeks 1 day EGA. Absence of fetal cardiac activity, new since prior exam. Findings meet definitive criteria for failed pregnancy. This follows SRU consensus guidelines: Diagnostic Criteria for Nonviable Pregnancy Early in the First Trimester. [facility] Mis J Med [phone]. Electronically Signed   By: Ulyses Southward M.D.   On: 08/09/2022 13:01    Early Intrauterine Pregnancy Failure  _X__  Documented intrauterine pregnancy failure less than or equal to [redacted] weeks gestation  _X__  No serious current illness  _X__  Baseline Hgb greater than or equal to 10g/dl  __X_  Patient has easily accessible transportation to the hospital  __X_  Clear preference  __X_  Practitioner/physician deems patient reliable  _X__  Counseling by practitioner or physician    _N/A__  Rho-Gam given by RN if indicated  MDM BSUS Wet Prep CBC, hCG, UA Ultrasound Prescription Coordination of Follow Up Care Assessment and Plan  36 year old, [id number] SIUP at 8.0 weeks Vaginal Spotting Known [facility] Dysuria Language Barrier  -POC Reviewed. -Informed that BSUS would be performed and if unable to identify IUP would collect labs and send for formal US.  -Patient visibly upset and questions why she keeps having loses.  Informed that provider has not confirmed said loss. -BSUS with no identifiable IUP. -Patient informed of findings. -[place] and labs ordered. -Await results.  -Interpretations completed with assistance of video interpreter services: [place] [phone].   Cherre Robins 08/09/2022, 11:53 AM   Reassessment (1:33 PM) -Results as above -Condolences  given. -Discussed management and patient opts for cytotec dosing.  -Risks and benefits of medical management reviewed, including a success rate of about 90%, the need for another person to be at home with her, and to call/come in if she had heavy bleeding, dizziness, or severe pain not relieved by medication.   -She was instructed to call if there was no passage of tissue within 48 hours, she may need to administer a second dose.  -Patient advised to call or come in for evaluation for any concerning symptoms; bleeding precautions should be strictly emphasized.  -Rx sent to pharmacy on file.  -No medication sent for nausea d/t h/o Prolonged Q-T interval. -Pain medication also sent. -Informed of need to follow up in 2 weeks with office, but to call or return if any questions arise prior to pregnancy.  -Informed that UA without bacteria or other signs of infection. Discussed increased water intake.  -Patient without questions or concerns. -Bleeding precautions reviewed. -Message sent to [facility] for follow up appt. -Interpretations completed with assistance of video interpreter: Wynona Canes [id number].   Cherre Robins MSN, CNM Advanced Practice Provider, [facility] for [employer]

## 2022-08-10 ENCOUNTER — Other Ambulatory Visit: Payer: Self-pay

## 2022-08-24 ENCOUNTER — Telehealth: Payer: Self-pay

## 2022-08-29 ENCOUNTER — Ambulatory Visit: Payer: Self-pay | Admitting: Obstetrics and Gynecology

## 2022-09-09 ENCOUNTER — Encounter: Payer: Self-pay | Admitting: Obstetrics and Gynecology

## 2022-10-18 ENCOUNTER — Ambulatory Visit (INDEPENDENT_AMBULATORY_CARE_PROVIDER_SITE_OTHER): Payer: Self-pay | Admitting: Obstetrics and Gynecology

## 2022-10-18 ENCOUNTER — Encounter: Payer: Self-pay | Admitting: Obstetrics and Gynecology

## 2022-10-18 VITALS — BP 113/81 | HR 85 | Ht 63.0 in | Wt 144.7 lb

## 2022-10-18 DIAGNOSIS — O021 Missed abortion: Secondary | ICD-10-CM

## 2022-10-18 DIAGNOSIS — N96 Recurrent pregnancy loss: Secondary | ICD-10-CM

## 2022-10-18 DIAGNOSIS — N946 Dysmenorrhea, unspecified: Secondary | ICD-10-CM

## 2022-10-18 NOTE — Progress Notes (Signed)
GYNECOLOGY VISIT  Patient name: [name] MRN [id number]  Date of birth: May 22, 1986 Chief Complaint:   Follow-up  History:  [name] is a 37 y.o. [id number] being seen today for missed SAB follow up.  No longer having bleeding, taken advil with some relif. Pain is in LLQ. No fever, chills, no nausea. Bleeding after medication for just 2 days. May have had irregular bleeding prior to pregnancy. . Unprotected intercourse  in the interim. 4 days, 1/2-1/8 of menses. Same person/partner for all pregnancies. No changes in their health; no change in her health during this time. All first trimester losses. Goal is this would be her last baby - just wants a 4th.   Breast cysts for when on her menses. A lump/cyst maybe 3 yrs had a breast cyst rupture. Prior imaging within normal. Nothing from the nipples and no breast changes. Typically pain is both breasts and during her cycle.    Also have dysmenorrhea. Having increased pain w/ last pregnancy loss. States signficnat cramping that occurs with her menses now.    Past Medical History:  Diagnosis Date   Bulimia nervosa    no current problems  (07/2022)   Eczema    GERD (gastroesophageal reflux disease)    Hyperthyroidism    Kidney stone     Past Surgical History:  Procedure Laterality Date   COLONOSCOPY WITH PROPOFOL N/A 03/18/2016   Procedure: COLONOSCOPY WITH PROPOFOL;  Surgeon: Clarene Essex, MD;  [facility];  Service: Endoscopy;  Laterality: N/A;   ESOPHAGOGASTRODUODENOSCOPY (EGD) WITH PROPOFOL N/A 09/22/2017   Procedure: ESOPHAGOGASTRODUODENOSCOPY (EGD) WITH PROPOFOL;  Surgeon: Clarene Essex, MD;  Location: [place];  Service: Endoscopy;  Laterality: N/A;    The following portions of the patient's history were reviewed and updated as appropriate: allergies, current medications, past family history, past medical history, past social history, past surgical history and problem list.   Health  Maintenance:   Last pap 06/2021. Results were: NILM w/ HRHPV negative.     Review of Systems:  Pertinent items are noted in HPI. Comprehensive review of systems was otherwise negative.   Objective:  Physical Exam BP 113/81   Pulse 85   Ht 5\' 3"  (1.6 m)   Wt 144 lb 11.2 oz (65.6 kg)   LMP 06/14/2022   Breastfeeding Unknown   BMI 25.63 kg/m    Physical Exam Vitals and nursing note reviewed.  Constitutional:      Appearance: Normal appearance.  HENT:     Head: Normocephalic and atraumatic.  Pulmonary:     Effort: Pulmonary effort is normal.  Skin:    General: Skin is warm and dry.  Neurological:     General: No focal deficit present.     Mental Status: She is alert.  Psychiatric:        Mood and Affect: Mood normal.        Behavior: Behavior normal.        Thought Content: Thought content normal.        Judgment: Judgment normal.      Labs and Imaging No results found.     Assessment & Plan:   1. Missed abortion Likely has completed AB, hcg for confirmation  - Beta hCG quant (ref lab)  2. Dysmenorrhea Workup for pain associated with menses including [place] to assess for structural components.  - US PELVIC COMPLETE WITH TRANSVAGINAL; Future - Prolactin - Follicle stimulating hormone - Hemoglobin A1c - TSH Rfx  on Abnormal to Free T4 - Estradiol  3. Recurrent pregnancy loss Now 3 early first trimester losses, no genetic information from prior loss given passed at home. Initiate RPL workup and referral to REI  - Prolactin - Follicle stimulating hormone - Hemoglobin A1c - TSH Rfx on Abnormal to Free T4 - Estradiol - Lupus Anticoag/Cardiolipin Ab   Routine preventative health maintenance measures emphasized.  Darliss Cheney, MD [facility] [place]

## 2022-10-26 ENCOUNTER — Telehealth: Payer: Self-pay | Admitting: *Deleted

## 2022-10-26 LAB — LUPUS ANTICOAG/CARDIOLIPIN AB
APTT: 27.2 s
Anticardiolipin Ab, IgG: <10 [GPL'U]
Anticardiolipin Ab, IgM: <10 [MPL'U]
Beta-2 Glycoprotein I, IgA: <10 SAU
Beta-2 Glycoprotein I, IgG: 17 SGU
Beta-2 Glycoprotein I, IgM: <10 SMU
DRVVT Screen Seconds: 31.5 s
Hexagonal Phospholipid Neutral: 6 s
INR: 1 ratio
Prothrombin Time: 10.6 s
Thrombin Time: 16.7 s

## 2022-10-26 LAB — HEMOGLOBIN A1C
Est. average glucose Bld gHb Est-mCnc: 108 mg/dL
Hgb A1c MFr Bld: 5.4 % (ref 4.8–5.6)

## 2022-10-26 LAB — ESTRADIOL: Estradiol: 182 pg/mL

## 2022-10-26 LAB — TSH RFX ON ABNORMAL TO FREE T4: TSH: 2.09 u[IU]/mL (ref 0.450–4.500)

## 2022-10-26 LAB — BETA HCG QUANT (REF LAB): hCG Quant: <1 m[IU]/mL

## 2022-10-26 LAB — FOLLICLE STIMULATING HORMONE: FSH: 5 m[IU]/mL

## 2022-10-26 LAB — PROLACTIN: Prolactin: 11.1 ng/mL (ref 4.8–33.4)

## 2022-10-26 NOTE — Telephone Encounter (Addendum)
-----  Message from Darliss Cheney, MD sent at 10/26/2022  2:52 PM EST ----- Please contact and inform that labs were negative/normal. Will follo wup [place] and follow up afterwards.   1/24  1629  Called pt w/[name] - interpreter. She was informed of test results as stated by Dr. Currie Paris and to keep her ultrasound appointment on 1/29. Pt voiced understanding.

## 2022-10-31 ENCOUNTER — Ambulatory Visit
Admission: RE | Admit: 2022-10-31 | Discharge: 2022-10-31 | Disposition: A | Discharge status: Home / Self Care | Payer: Self-pay | Source: Ambulatory Visit | Attending: Obstetrics and Gynecology | Admitting: Obstetrics and Gynecology

## 2022-10-31 DIAGNOSIS — N946 Dysmenorrhea, unspecified: Secondary | ICD-10-CM | POA: Insufficient documentation

## 2022-11-08 ENCOUNTER — Ambulatory Visit: Payer: Self-pay | Attending: Family Medicine

## 2022-11-23 ENCOUNTER — Other Ambulatory Visit: Payer: Self-pay | Admitting: Pharmacist

## 2022-11-23 ENCOUNTER — Other Ambulatory Visit: Payer: Self-pay

## 2022-11-23 ENCOUNTER — Telehealth: Payer: Self-pay | Admitting: Pharmacist

## 2022-11-23 DIAGNOSIS — E039 Hypothyroidism, unspecified: Secondary | ICD-10-CM

## 2022-11-23 MED ORDER — LEVOTHYROXINE SODIUM 25 MCG PO TABS
25.0000 ug | ORAL_TABLET | Freq: Every day | ORAL | 0 refills | Status: DC
  Filled 2022-11-23: qty 30, 30d supply, fill #0

## 2022-11-23 NOTE — Telephone Encounter (Signed)
Okay to change.  Thanks

## 2022-11-23 NOTE — Telephone Encounter (Signed)
Received notice that pharmacy has run out of her current manufacturer of levothyroxine. Since this is an NTI med, we need documented PCP approval to change NDCs. Recommend updated labs at follow-up next month.

## 2022-12-12 ENCOUNTER — Ambulatory Visit: Payer: Self-pay | Attending: Family Medicine | Admitting: Family Medicine

## 2022-12-12 ENCOUNTER — Encounter: Payer: Self-pay | Admitting: Family Medicine

## 2022-12-12 ENCOUNTER — Other Ambulatory Visit: Payer: Self-pay

## 2022-12-12 VITALS — BP 110/72 | HR 79 | Temp 98.3°F | Ht 63.0 in | Wt 141.0 lb

## 2022-12-12 DIAGNOSIS — E039 Hypothyroidism, unspecified: Secondary | ICD-10-CM

## 2022-12-12 DIAGNOSIS — G8929 Other chronic pain: Secondary | ICD-10-CM

## 2022-12-12 DIAGNOSIS — Z13228 Encounter for screening for other metabolic disorders: Secondary | ICD-10-CM

## 2022-12-12 DIAGNOSIS — M546 Pain in thoracic spine: Secondary | ICD-10-CM

## 2022-12-12 MED ORDER — LIDOCAINE 5 % EX PTCH
1.0000 | MEDICATED_PATCH | CUTANEOUS | 1 refills | Status: DC
  Filled 2022-12-12: qty 30, 30d supply, fill #0

## 2022-12-12 NOTE — Progress Notes (Signed)
Subjective:  Patient ID: [name], female    DOB: 07/24/1986  Age: 37 y.o. MRN: [id number]  CC: Hypothyroidism   HPI [name] is a 37 y.o. year old female with a history of hypothyroidism here for an office visit.  Interval History:  For 1 month she has had thoracic back pain worse when she cleans and it radiates to her neck and arms with mild relief when she uses Advil. She has no tingling or numbness in her arms. Pain is described as mild.  She endorses adherence with her levothyroxine and is in need of refills today.  She is fasting in anticipation of blood work today. Past Medical History:  Diagnosis Date   Bulimia nervosa    no current problems  (07/2022)   Eczema    GERD (gastroesophageal reflux disease)    Hyperthyroidism    Kidney stone     Past Surgical History:  Procedure Laterality Date   COLONOSCOPY WITH PROPOFOL N/A 03/18/2016   Procedure: COLONOSCOPY WITH PROPOFOL;  Surgeon: Clarene Essex, MD;  [facility];  Service: Endoscopy;  Laterality: N/A;   ESOPHAGOGASTRODUODENOSCOPY (EGD) WITH PROPOFOL N/A 09/22/2017   Procedure: ESOPHAGOGASTRODUODENOSCOPY (EGD) WITH PROPOFOL;  Surgeon: Clarene Essex, MD;  Location: [place];  Service: Endoscopy;  Laterality: N/A;    Family History  Problem Relation Age of Onset   Healthy Mother    Diabetes Father    Hearing loss Neg Hx    Cancer Neg Hx    Heart failure Neg Hx     Social History   Socioeconomic History   Marital status: Married    Spouse name: Not on file   Number of children: Not on file   Years of education: Not on file   Highest education level: Not on file  Occupational History   Not on file  Tobacco Use   Smoking status: Never   Smokeless tobacco: Never  Vaping Use   Vaping Use: Never used  Substance and Sexual Activity   Alcohol use: Never   Drug use: Never   Sexual activity: Yes    Birth control/protection: None  Other Topics Concern   Not on file   Social History Narrative   ** Merged History Encounter **       ** Merged History Encounter **       Lives in [place] since 2005 with husband and 2 children (2011, 2008). Moved from [place] in 2005. Spanish speaking.    Social Determinants of Health   Financial Resource Strain: Not on file  Food Insecurity: No Food Insecurity (08/20/2021)   Hunger Vital Sign    Worried About Running Out of Food in the Last Year: Never true    Ran Out of Food in the Last Year: Never true  Transportation Needs: No Transportation Needs (08/20/2021)   PRAPARE - Hydrologist (Medical): No    Lack of Transportation (Non-Medical): No  Physical Activity: Not on file  Stress: Not on file  Social Connections: Not on file    No Known Allergies  Outpatient Medications Prior to Visit  Medication Sig Dispense Refill   levothyroxine (SYNTHROID) 25 MCG tablet Take 1 tablet (25 mcg total) by mouth daily. 30 tablet 0   famotidine (PEPCID) 20 MG tablet Take 1 tablet (20 mg total) by mouth 2 (two) times daily. 30 tablet 2   ibuprofen (ADVIL) 600 MG tablet Take 1 tablet (600 mg total) by mouth  every 6 (six) hours as needed. 30 tablet 0   docusate sodium (COLACE) 250 MG capsule Take 1 capsule (250 mg total) by mouth daily. 30 capsule 2   misoprostol (CYTOTEC) 200 MCG tablet Place 4 tablets (800 mcg total) vaginally once. 4 tablet 0   No facility-administered medications prior to visit.     ROS Review of Systems  Constitutional:  Negative for activity change and appetite change.  HENT:  Negative for sinus pressure and sore throat.   Respiratory:  Negative for chest tightness, shortness of breath and wheezing.   Cardiovascular:  Negative for chest pain and palpitations.  Gastrointestinal:  Negative for abdominal distention, abdominal pain and constipation.  Genitourinary: Negative.   Musculoskeletal:        See HPI  Psychiatric/Behavioral:  Negative for behavioral problems  and dysphoric mood.     Objective:  BP 110/72   Pulse 79   Temp 98.3 F (36.8 C) (Oral)   Ht '5\' 3"'$  (1.6 m)   Wt 141 lb (64 kg)   LMP 06/14/2022   SpO2 98%   BMI 24.98 kg/m      12/12/2022    9:25 AM 10/18/2022   10:11 AM 08/09/2022   11:33 AM  BP/Weight  Systolic BP [id number]  Diastolic BP 72 81 88  Wt. (Lbs) 141 144.7 148.4  BMI 24.98 kg/m2 25.63 kg/m2 26.29 kg/m2      Physical Exam Constitutional:      Appearance: She is well-developed.  Cardiovascular:     Rate and Rhythm: Normal rate.     Heart sounds: Normal heart sounds. No murmur heard. Pulmonary:     Effort: Pulmonary effort is normal.     Breath sounds: Normal breath sounds. No wheezing or rales.  Chest:     Chest wall: No tenderness.  Abdominal:     General: Bowel sounds are normal. There is no distension.     Palpations: Abdomen is soft. There is no mass.     Tenderness: There is no abdominal tenderness.  Musculoskeletal:     Cervical back: Tenderness (slight tenderness on deep palpation of thoracic spine) present.     Right lower leg: No edema.     Left lower leg: No edema.     Comments: Upper thoracic pain on twisting motion of the spine  Neurological:     Mental Status: She is alert and oriented to person, place, and time.  Psychiatric:        Mood and Affect: Mood normal.        Latest Ref Rng & Units 12/07/2021    9:44 AM 01/28/2021    9:53 AM 08/06/2020    3:57 PM  CMP  Glucose 70 - 99 mg/dL 83  88  89   BUN 6 - 20 mg/dL '10  9  11   '$ Creatinine 0.57 - 1.00 mg/dL 0.69  0.69  0.62   Sodium 134 - 144 mmol/L 139  139  139   Potassium 3.5 - 5.2 mmol/L 4.2  4.1  4.3   Chloride 96 - 106 mmol/L 103  102  102   CO2 20 - 29 mmol/L '20  19  25   '$ Calcium 8.7 - 10.2 mg/dL 9.4  9.6  9.6   Total Protein 6.0 - 8.5 g/dL   7.2   Total Bilirubin 0.0 - 1.2 mg/dL   0.2   Alkaline Phos 44 - 121 IU/L   92   AST 0 - 40 IU/L  20   ALT 0 - 32 IU/L   18     Lipid Panel     Component Value Date/Time    CHOL 208 (H) 06/09/2022 1011   TRIG 113 06/09/2022 1011   HDL 55 06/09/2022 1011   LDLCALC 133 (H) 06/09/2022 1011    CBC    Component Value Date/Time   WBC 10.8 (H) 08/09/2022 1323   RBC 4.65 08/09/2022 1323   HGB 14.2 08/09/2022 1323   HGB 14.5 01/28/2021 0953   HCT 40.0 08/09/2022 1323   HCT 42.6 01/28/2021 0953   PLT 429 (H) 08/09/2022 1323   PLT 424 01/28/2021 0953   MCV 86.0 08/09/2022 1323   MCV 88 01/28/2021 0953   MCH 30.5 08/09/2022 1323   MCHC 35.5 08/09/2022 1323   RDW 13.5 08/09/2022 1323   RDW 13.2 01/28/2021 0953   LYMPHSABS 2.3 01/28/2021 0953   MONOABS 0.3 10/02/2017 1139   EOSABS 0.1 01/28/2021 0953   BASOSABS 0.0 01/28/2021 0953    Lab Results  Component Value Date   HGBA1C 5.4 10/18/2022    Lab Results  Component Value Date   TSH 2.090 10/18/2022    Assessment & Plan:  1. Chronic midline thoracic back pain Musculoskeletal Advised to apply heat - lidocaine (LIDODERM) 5 %; Place 1 patch onto the skin daily. Remove & Discard patch within 12 hours or as directed by MD  Dispense: 30 patch; Refill: 1 - Ambulatory referral to Physical Therapy  2. Acquired hypothyroidism Controlled - T4, free - TSH - T3 - CBC with Differential/Platelet  3. Screening for metabolic disorder - LP+Non-HDL Cholesterol - CMP14+EGFR    Meds ordered this encounter  Medications   lidocaine (LIDODERM) 5 %    Sig: Place 1 patch onto the skin daily. Remove & Discard patch within 12 hours or as directed by MD    Dispense:  30 patch    Refill:  1    Follow-up: Return in about 1 year (around 12/12/2023) for Chronic medical conditions.       Charlott Rakes, MD, FAAFP. [facility] [place], [place]   12/12/2022, 1:28 PM

## 2022-12-12 NOTE — Progress Notes (Signed)
Back pain

## 2022-12-12 NOTE — Patient Instructions (Signed)
Ejercicios para la espalda Back Exercises Los siguientes ejercicios fortalecen los msculos que dan soporte al tronco (torso) y a la espalda. Adems, ayudan a mantener la flexibilidad de la zona lumbar. Hacer estos ejercicios puede ser de ayuda para evitar o aliviar el dolor lumbar. Si tiene dolor o molestias en la espalda, intente hacer estos ejercicios 2 o 3 veces por da, o como se lo haya indicado el mdico. A medida que el dolor desaparece, hgalos una vez por da, pero aumente la cantidad de veces que repite los pasos para cada ejercicio (haga ms repeticiones). Para prevenir la recurrencia del dolor de espalda, contine haciendo estos ejercicios una vez al da o como se lo haya indicado el mdico. Haga los ejercicios exactamente como se lo haya indicado el mdico y gradelos como se lo hayan indicado. Es normal sentir un leve estiramiento, tirn, rigidez o molestia cuando haga estos ejercicios, pero debe detenerse de inmediato si siente un dolor repentino o si el dolor empeora. Ejercicios Rodilla al pecho Repita estos pasos 3 a 5 veces con cada pierna: Acustese boca arriba sobre una cama dura o sobre el suelo con las piernas extendidas. Lleve una rodilla al pecho. La otra pierna debe quedar extendida y en contacto con el suelo. Mantenga la rodilla contra el pecho al tomarse la rodilla o el muslo con ambas manos y sostenga. Tire de la rodilla hasta sentir una elongacin suave en la parte baja de la espalda o las nalgas. Mantenga la elongacin durante 10 a 30 segundos. Suelte y extienda la pierna lentamente.  Inclinacin de la pelvis Repita estos pasos 5 a 10 veces: Acustese boca arriba sobre una cama dura o sobre el suelo con las piernas extendidas. Flexione las rodillas de modo que apunten al techo y los pies queden apoyados en el suelo. Contraiga los msculos de la parte baja del abdomen para empujar la zona lumbar contra el suelo. Con este movimiento se inclinar la pelvis de modo que  el coxis apunte hacia el techo, en lugar de apuntar a los pies o al suelo. Contraiga suavemente y respire con normalidad mientras mantiene esta posicin durante 5 a 10 segundos.  El perro y el gato Repita estos pasos hasta que la zona lumbar se vuelva ms flexible: Apoye las palmas de las manos y las rodillas sobre una cama firme o el suelo. Las manos deben estar alineadas con los hombros y las rodillas con las caderas. Puede colocarse almohadillas debajo de las rodillas para estar cmodo. Deje que la cabeza cuelgue hacia el pecho. Contraiga los msculos abdominales y baje el coxis en direccin al suelo de modo que la zona lumbar se arquee como el lomo de un gato asustado. Mantenga esta posicin durante 5 segundos. Lentamente, levante la cabeza, relaje los msculos abdominales y eleve el coxis de modo que apunte en direccin al techo para que la espalda forme un arco hundido como el lomo de un perro contento. Mantenga esta posicin durante 5 segundos.  Flexiones de brazos Repita estos pasos 5 a 10 veces: Acustese sobre el abdomen (boca abajo) en una cama firme o en el suelo. Coloque las palmas de las manos cerca de la cabeza, separadas aproximadamente al ancho de los hombros. Con la espalda lo ms relajada posible y las caderas apoyadas en el suelo, extienda lentamente los brazos para levantar la mitad superior del cuerpo y elevar los hombros. No use los msculos de la espalda para elevar la parte superior del torso. Puede cambiar las manos de   lugar para estar ms cmodo. Mantenga esta posicin durante 5 segundos mientras mantiene la espalda relajada. Lentamente vuelva a la posicin horizontal.  Puentes Repita estos pasos 10 veces: Acustese boca arriba sobre una cama firme o sobre el suelo. Flexione las rodillas de modo que apunten al techo y los pies queden apoyados en el suelo. Los brazos deben estar paralelos a los costados del cuerpo, junto al cuerpo. Contraiga los glteos y despegue las  nalgas del suelo hasta que la cintura est casi a la misma altura que las rodillas. Debe sentir el trabajo muscular en las nalgas y la parte de atrs de los muslos. Si no siente el esfuerzo de estos msculos, aleje los pies 1 a 2 pulgadas (2.5 a 5 cm) de las nalgas. Mantenga esta posicin durante 3 a 5 segundos. Baje lentamente las caderas a la posicin inicial y relaje las nalgas por completo. Si este ejercicio le resulta muy fcil, intente realizarlo con los brazos cruzados sobre el pecho. Abdominales Repita estos pasos 5 a 10 veces: Acustese boca arriba sobre una cama dura o sobre el suelo con las piernas extendidas. Flexione las rodillas de modo que apunten al techo y los pies queden apoyados en el suelo. Cruce los brazos sobre el pecho. Baje levemente el mentn en direccin al pecho sin doblar el cuello. Contraiga los msculos abdominales y con lentitud eleve el torso lo suficiente como para despegar levemente los omplatos del suelo. No eleve el torso ms que eso, porque esto puede sobreexigir a la zona lumbar y no ayuda a fortalecer los msculos abdominales. Regrese lentamente a la posicin inicial.  Elevaciones de espalda Repita estos pasos 5 a 10 veces: Acustese sobre el abdomen (boca abajo) con los brazos a los costados del cuerpo y apoye la frente en el suelo. Contraiga los msculos de las piernas y las nalgas. Lentamente despegue el pecho del suelo mientras mantiene las caderas bien apoyadas en el suelo. Mantenga la nuca alineada con la curvatura de la espalda. Los ojos deben mirar al suelo. Mantenga esta posicin durante 3 a 5 segundos. Regrese lentamente a la posicin inicial.  Comunquese con un mdico si: El dolor o las molestias en la espalda se vuelven mucho ms intensos cuando hace un ejercicio. El dolor o las molestias en la espalda que empeoran, no se alivian en el trmino de las 2 horas posteriores a hacer los ejercicios. Si tiene alguno de estos problemas, deje de  hacer los ejercicios de inmediato. No vuelva a hacer los ejercicios a menos que el mdico lo autorice. Solicite ayuda de inmediato si: Siente un dolor sbito e intenso en la espalda. Si esto ocurre, deje de hacer los ejercicios de inmediato. No vuelva a hacer los ejercicios a menos que el mdico lo autorice. Esta informacin no tiene como fin reemplazar el consejo del mdico. Asegrese de hacerle al mdico cualquier pregunta que tenga. Document Revised: 04/09/2021 Document Reviewed: 04/09/2021 Elsevier Patient Education  2023 Elsevier Inc.  

## 2022-12-13 LAB — LP+NON-HDL CHOLESTEROL
Cholesterol, Total: 206 mg/dL — ABNORMAL HIGH (ref 100–199)
HDL: 61 mg/dL (ref >39)
LDL Chol Calc (NIH): 126 mg/dL — ABNORMAL HIGH (ref 0–99)
Total Non-HDL-Chol (LDL+VLDL): 145 mg/dL — ABNORMAL HIGH (ref 0–129)
Triglycerides: 106 mg/dL (ref 0–149)
VLDL Cholesterol Cal: 19 mg/dL (ref 5–40)

## 2022-12-13 LAB — CBC WITH DIFFERENTIAL/PLATELET
Basophils Absolute: 0 10*3/uL (ref 0.0–0.2)
Basos: 1 %
EOS (ABSOLUTE): 0.1 10*3/uL (ref 0.0–0.4)
Eos: 1 %
Hematocrit: 42.1 % (ref 34.0–46.6)
Hemoglobin: 13.9 g/dL (ref 11.1–15.9)
Immature Grans (Abs): 0 10*3/uL (ref 0.0–0.1)
Immature Granulocytes: 0 %
Lymphocytes Absolute: 1.9 10*3/uL (ref 0.7–3.1)
Lymphs: 34 %
MCH: 28.6 pg (ref 26.6–33.0)
MCHC: 33 g/dL (ref 31.5–35.7)
MCV: 87 fL (ref 79–97)
Monocytes Absolute: 0.4 10*3/uL (ref 0.1–0.9)
Monocytes: 7 %
Neutrophils Absolute: 3.3 10*3/uL (ref 1.4–7.0)
Neutrophils: 57 %
Platelets: 419 10*3/uL (ref 150–450)
RBC: 4.86 x10E6/uL (ref 3.77–5.28)
RDW: 13.1 % (ref 11.7–15.4)
WBC: 5.7 10*3/uL (ref 3.4–10.8)

## 2022-12-13 LAB — T4, FREE: Free T4: 1.36 ng/dL (ref 0.82–1.77)

## 2022-12-13 LAB — CMP14+EGFR
ALT: 12 IU/L (ref 0–32)
AST: 16 IU/L (ref 0–40)
Albumin/Globulin Ratio: 2 (ref 1.2–2.2)
Albumin: 4.7 g/dL (ref 3.9–4.9)
Alkaline Phosphatase: 95 IU/L (ref 44–121)
BUN/Creatinine Ratio: 12 (ref 9–23)
BUN: 8 mg/dL (ref 6–20)
Bilirubin Total: 0.3 mg/dL (ref 0.0–1.2)
CO2: 21 mmol/L (ref 20–29)
Calcium: 9.3 mg/dL (ref 8.7–10.2)
Chloride: 104 mmol/L (ref 96–106)
Creatinine, Ser: 0.69 mg/dL (ref 0.57–1.00)
Globulin, Total: 2.4 g/dL (ref 1.5–4.5)
Glucose: 97 mg/dL (ref 70–99)
Potassium: 4.3 mmol/L (ref 3.5–5.2)
Sodium: 138 mmol/L (ref 134–144)
Total Protein: 7.1 g/dL (ref 6.0–8.5)
eGFR: 115 mL/min/{1.73_m2} (ref >59)

## 2022-12-13 LAB — TSH: TSH: 1.67 u[IU]/mL (ref 0.450–4.500)

## 2022-12-13 LAB — T3: T3, Total: 118 ng/dL (ref 71–180)

## 2022-12-15 ENCOUNTER — Other Ambulatory Visit: Payer: Self-pay | Admitting: Family Medicine

## 2022-12-15 ENCOUNTER — Other Ambulatory Visit: Payer: Self-pay

## 2022-12-15 DIAGNOSIS — E039 Hypothyroidism, unspecified: Secondary | ICD-10-CM

## 2022-12-15 MED ORDER — LEVOTHYROXINE SODIUM 25 MCG PO TABS
25.0000 ug | ORAL_TABLET | Freq: Every day | ORAL | 1 refills | Status: DC
  Filled 2022-12-15 – 2023-01-05 (×2): qty 90, 90d supply, fill #0
  Filled 2023-04-01 – 2023-04-10 (×2): qty 90, 90d supply, fill #1

## 2022-12-19 ENCOUNTER — Other Ambulatory Visit: Payer: Self-pay

## 2022-12-22 ENCOUNTER — Other Ambulatory Visit: Payer: Self-pay

## 2023-01-02 ENCOUNTER — Ambulatory Visit: Payer: Self-pay

## 2023-01-05 ENCOUNTER — Other Ambulatory Visit: Payer: Self-pay

## 2023-01-06 ENCOUNTER — Other Ambulatory Visit: Payer: Self-pay

## 2023-04-03 ENCOUNTER — Other Ambulatory Visit: Payer: Self-pay

## 2023-04-10 ENCOUNTER — Other Ambulatory Visit: Payer: Self-pay

## 2023-05-15 ENCOUNTER — Other Ambulatory Visit: Payer: Self-pay

## 2023-05-15 MED ORDER — AMOXICILLIN 500 MG PO CAPS
500.0000 mg | ORAL_CAPSULE | Freq: Three times a day (TID) | ORAL | 0 refills | Status: AC
  Filled 2023-05-15: qty 21, 7d supply, fill #0

## 2023-07-05 NOTE — Progress Notes (Signed)
Patient went to [facility] and obtained pregnancy verification form on 06/30/23.   LMP reported 05/28/23 (definite and regular per patient). EDD at 03/03/24 based on LMP.   Maureen Ralphs RN on 07/05/23 at [phone]

## 2023-07-31 ENCOUNTER — Other Ambulatory Visit: Payer: Self-pay | Admitting: Family Medicine

## 2023-07-31 ENCOUNTER — Other Ambulatory Visit: Payer: Self-pay

## 2023-07-31 DIAGNOSIS — E039 Hypothyroidism, unspecified: Secondary | ICD-10-CM

## 2023-07-31 MED ORDER — LEVOTHYROXINE SODIUM 25 MCG PO TABS
25.0000 ug | ORAL_TABLET | Freq: Every day | ORAL | 1 refills | Status: DC
  Filled 2023-07-31: qty 90, 90d supply, fill #0
  Filled 2023-10-23 (×2): qty 90, 90d supply, fill #1

## 2023-08-02 ENCOUNTER — Other Ambulatory Visit: Payer: Self-pay

## 2023-08-03 ENCOUNTER — Other Ambulatory Visit: Payer: Self-pay

## 2023-08-03 ENCOUNTER — Ambulatory Visit (INDEPENDENT_AMBULATORY_CARE_PROVIDER_SITE_OTHER): Payer: Self-pay

## 2023-08-03 ENCOUNTER — Other Ambulatory Visit (HOSPITAL_COMMUNITY)
Admission: RE | Admit: 2023-08-03 | Discharge: 2023-08-03 | Disposition: A | Discharge status: Home / Self Care | Payer: Self-pay | Source: Ambulatory Visit | Attending: Family Medicine | Admitting: Family Medicine

## 2023-08-03 VITALS — BP 120/82 | HR 96 | Wt 148.5 lb

## 2023-08-03 DIAGNOSIS — O0991 Supervision of high risk pregnancy, unspecified, first trimester: Secondary | ICD-10-CM | POA: Insufficient documentation

## 2023-08-03 DIAGNOSIS — O0990 Supervision of high risk pregnancy, unspecified, unspecified trimester: Secondary | ICD-10-CM | POA: Insufficient documentation

## 2023-08-03 DIAGNOSIS — Z3A09 9 weeks gestation of pregnancy: Secondary | ICD-10-CM

## 2023-08-03 NOTE — Patient Instructions (Signed)

## 2023-08-03 NOTE — Progress Notes (Signed)
New OB Intake  Patient came in office today for visit on 08/03/23 at 1015.   I discussed the limitations, risks, security and privacy concerns of performing an evaluation and management service by telephone and the availability of in person appointments. I also discussed with the patient that there may be a patient responsible charge related to this service. The patient expressed understanding and agreed to proceed.  I explained I am completing New OB Intake today. We discussed EDD of Not found.. Pt is [id number]. I reviewed her allergies, medications and Medical/Surgical/OB history.    Concerns addressed today  Delivery Plans Plans to deliver at [facility]. Discussed the nature of our practice with multiple providers including residents and students. Due to the size of the practice, the delivering provider may not be the same as those providing prenatal care.   Patient is interested in water birth. Offered upcoming OB visit with CNM to discuss further.  MyChart/Babyscripts MyChart access verified. I explained pt will have some visits in office and some virtually. Babyscripts instructions given and order placed. Patient verifies receipt of registration text/e-mail. Account successfully created and app downloaded.  Blood Pressure Cuff/Weight Scale Patient is self-pay; explained patient will be given BP cuff at first prenatal appt. Explained after first prenatal appt pt will check weekly and document in Babyscripts. Patient does not have weight scale; patient may purchase if they desire to track weight weekly in Babyscripts.  Anatomy US Explained first scheduled [place] will be around 19 weeks. Anatomy US scheduled for 10/02/23 at 1330.  Is patient a CenteringPregnancy candidate?  Not a Candidate Declined due to  N/A Not a candidate due to Complex coordination of care needed If accepted,    Is patient a Mom+Baby Combined Care candidate?  Not a candidate   If accepted, confirm patient does not  intend to move from the area for at least 12 months, then notify Mom+Baby staff  Interested in [place]? If yes, send referral and doula dot phrase.   Is patient a candidate for Babyscripts Optimization? No, patient is a high risk pregnancy with maternal chronic conditions (AMA and hypothyroidism) that do not make patient optimal for Babyscripts optimization.   First visit review I reviewed new OB appt with patient. Explained pt will be seen by Dorathy Daft CNM at first visit on 08/10/23 at 0955. Discussed Avelina Laine genetic screening with patient. Deferred testing for Panorama and Horizon today--wants to consult with partner and [employer] representative. Routine prenatal labs  done at this visit.     Last Pap Diagnosis  Date Value Ref Range Status  06/09/2021   Final   - Negative for intraepithelial lesion or malignancy (NILM)    Meryl Crutch, RN 08/03/2023  10:56 AM

## 2023-08-04 LAB — CBC/D/PLT+RPR+RH+ABO+RUBIGG...
Antibody Screen: NEGATIVE
Basophils Absolute: 0 10*3/uL (ref 0.0–0.2)
Basos: 0 %
EOS (ABSOLUTE): 0 10*3/uL (ref 0.0–0.4)
Eos: 0 %
HCV Ab: NONREACTIVE
HIV Screen 4th Generation wRfx: NONREACTIVE
Hematocrit: 41.5 % (ref 34.0–46.6)
Hemoglobin: 13.9 g/dL (ref 11.1–15.9)
Hepatitis B Surface Ag: NEGATIVE
Immature Grans (Abs): 0.1 10*3/uL (ref 0.0–0.1)
Immature Granulocytes: 1 %
Lymphocytes Absolute: 2 10*3/uL (ref 0.7–3.1)
Lymphs: 19 %
MCH: 30.2 pg (ref 26.6–33.0)
MCHC: 33.5 g/dL (ref 31.5–35.7)
MCV: 90 fL (ref 79–97)
Monocytes Absolute: 0.8 10*3/uL (ref 0.1–0.9)
Monocytes: 7 %
Neutrophils Absolute: 7.6 10*3/uL — ABNORMAL HIGH (ref 1.4–7.0)
Neutrophils: 73 %
Platelets: 397 10*3/uL (ref 150–450)
RBC: 4.6 x10E6/uL (ref 3.77–5.28)
RDW: 13.2 % (ref 11.7–15.4)
RPR Ser Ql: NONREACTIVE
Rh Factor: POSITIVE
Rubella Antibodies, IGG: 5.34 {index} (ref >0.99)
WBC: 10.4 10*3/uL (ref 3.4–10.8)

## 2023-08-04 LAB — HCV INTERPRETATION

## 2023-08-04 LAB — HEMOGLOBIN A1C
Est. average glucose Bld gHb Est-mCnc: 103 mg/dL
Hgb A1c MFr Bld: 5.2 % (ref 4.8–5.6)

## 2023-08-05 LAB — CULTURE, OB URINE

## 2023-08-05 LAB — URINE CULTURE, OB REFLEX

## 2023-08-07 LAB — GC/CHLAMYDIA PROBE AMP (~~LOC~~) NOT AT ARMC
Chlamydia: NEGATIVE
Comment: NEGATIVE
Comment: NORMAL
Neisseria Gonorrhea: NEGATIVE

## 2023-08-10 ENCOUNTER — Ambulatory Visit (INDEPENDENT_AMBULATORY_CARE_PROVIDER_SITE_OTHER): Payer: Self-pay | Admitting: Certified Nurse Midwife

## 2023-08-10 ENCOUNTER — Encounter: Payer: Self-pay | Admitting: Certified Nurse Midwife

## 2023-08-10 ENCOUNTER — Other Ambulatory Visit: Payer: Self-pay

## 2023-08-10 VITALS — BP 119/82 | HR 86 | Wt 151.2 lb

## 2023-08-10 DIAGNOSIS — Z1332 Encounter for screening for maternal depression: Secondary | ICD-10-CM

## 2023-08-10 DIAGNOSIS — O0991 Supervision of high risk pregnancy, unspecified, first trimester: Secondary | ICD-10-CM

## 2023-08-10 DIAGNOSIS — Z3A11 11 weeks gestation of pregnancy: Secondary | ICD-10-CM

## 2023-08-10 DIAGNOSIS — Z3A10 10 weeks gestation of pregnancy: Secondary | ICD-10-CM

## 2023-08-10 DIAGNOSIS — K219 Gastro-esophageal reflux disease without esophagitis: Secondary | ICD-10-CM

## 2023-08-10 DIAGNOSIS — Z8659 Personal history of other mental and behavioral disorders: Secondary | ICD-10-CM

## 2023-08-10 DIAGNOSIS — K5900 Constipation, unspecified: Secondary | ICD-10-CM

## 2023-08-10 DIAGNOSIS — E0590 Thyrotoxicosis, unspecified without thyrotoxic crisis or storm: Secondary | ICD-10-CM

## 2023-08-10 MED ORDER — DOCUSATE SODIUM 100 MG PO CAPS
100.0000 mg | ORAL_CAPSULE | Freq: Two times a day (BID) | ORAL | 0 refills | Status: DC
  Filled 2023-08-10: qty 60, 30d supply, fill #0

## 2023-08-10 NOTE — Patient Instructions (Addendum)

## 2023-08-14 ENCOUNTER — Telehealth: Payer: Self-pay | Admitting: Clinical

## 2023-08-14 NOTE — Telephone Encounter (Signed)
Attempt call regarding referral; Left HIPPA-compliant message to call back Asher Muir from [employer] for [employer] at [facility] at  [phone] [facility] office), per Spanish interpreter, Ruggerio, [phone].

## 2023-08-15 NOTE — Progress Notes (Signed)
History:   [name] is a 37 y.o. [id number] at [redacted]w[redacted]d by LMP being seen today for her first obstetrical visit.  Her obstetrical history is significant for advanced maternal age and Thyroid. . Patient does intend to breast feed. Pregnancy history fully reviewed.  Patient reports no complaints. She does express concerns for falling back into bulimia in this regnancy. As she feels as though she has gained too much weight at this point in her pregnancy.       HISTORY: OB History  Gravida Para Term Preterm AB Living  7 3 3  0 3 3  SAB IAB Ectopic Multiple Live Births  3 0 0 0 3    # Outcome Date GA Lbr Len/2nd Weight Sex Type Anes PTL Lv  7 Current           6 SAB 2022          5 SAB 2021          4 Term 04/12/14 [redacted]w[redacted]d 09:06 / 00:16 7 lb 3.7 oz (3.28 kg) M Vag-Spont None  LIV     Name: KALLISTA, LACY     Apgar1: 9  Apgar5: 9  3 Term 03/15/10    M Vag-Spont EPI N LIV  2 Term 06/24/06    F Vag-Spont EPI N LIV  1 SAB             Last pap smear was done 06/09/21 and was normal  Past Medical History:  Diagnosis Date   Bulimia nervosa    no current problems  (07/2022)   Eczema    GERD (gastroesophageal reflux disease)    Hyperthyroidism    Kidney stone    Past Surgical History:  Procedure Laterality Date   COLONOSCOPY WITH PROPOFOL N/A 03/18/2016   Procedure: COLONOSCOPY WITH PROPOFOL;  Surgeon: Vida Rigger, MD;  Location: [facility] ENDOSCOPY;  Service: Endoscopy;  Laterality: N/A;   ESOPHAGOGASTRODUODENOSCOPY (EGD) WITH PROPOFOL N/A 09/22/2017   Procedure: ESOPHAGOGASTRODUODENOSCOPY (EGD) WITH PROPOFOL;  Surgeon: Vida Rigger, MD;  Location: [facility] ENDOSCOPY;  Service: Endoscopy;  Laterality: N/A;   Family History  Problem Relation Age of Onset   Healthy Mother    Diabetes Father    Hearing loss Neg Hx    Cancer Neg Hx    Heart failure Neg Hx    Social History   Tobacco Use   Smoking status: Never   Smokeless tobacco: Never  Vaping Use   Vaping status: Never Used   Substance Use Topics   Alcohol use: Never   Drug use: Never   No Known Allergies Current Outpatient Medications on File Prior to Visit  Medication Sig Dispense Refill   Calcium & Magnesium Carbonates (MYLANTA PO) Take by mouth.     levothyroxine (SYNTHROID) 25 MCG tablet Take 1 tablet (25 mcg total) by mouth daily. 90 tablet 1   Prenatal Vit-Fe Fumarate-FA (PRENATAL VITAMIN PO) Take by mouth.     famotidine (PEPCID) 20 MG tablet Take 1 tablet (20 mg total) by mouth 2 (two) times daily. 30 tablet 2   ibuprofen (ADVIL) 600 MG tablet Take 1 tablet (600 mg total) by mouth every 6 (six) hours as needed. 30 tablet 0   lidocaine (LIDODERM) 5 % Place 1 patch onto the skin daily. Remove & Discard patch within 12 hours or as directed by MD 30 patch 1   [DISCONTINUED] cetirizine (ZYRTEC) 10 MG tablet Take 1 tablet (10 mg total) by mouth daily. (Patient not taking: No sig  reported) 30 tablet 11   [DISCONTINUED] fluticasone (FLONASE) 50 MCG/ACT nasal spray Place 2 sprays into both nostrils daily. (Patient not taking: Reported on 03/25/2020) 16 g 6   [DISCONTINUED] promethazine (PHENERGAN) 25 MG tablet Take 1 tablet (25 mg total) by mouth every 6 (six) hours as needed for nausea or vomiting. 30 tablet 0   No current facility-administered medications on file prior to visit.    Review of Systems Pertinent items noted in HPI and remainder of comprehensive ROS otherwise negative.  Indications for ASA therapy (per UpToDate) One of the following: Previous pregnancy with preeclampsia, especially early onset and with an adverse outcome No Multifetal gestation No Chronic hypertension No Type 1 or 2 diabetes mellitus No Chronic kidney disease No Autoimmune disease (antiphospholipid syndrome, systemic lupus erythematosus) No Two or more of the following: Nulliparity No Obesity (body mass index >30 kg/m2) No Family history of preeclampsia in mother or sister No Age >=35 years Yes Sociodemographic  characteristics (African American race, low socioeconomic level) Yes Personal risk factors (eg, previous pregnancy with low birth weight or small for gestational age infant, previous adverse pregnancy outcome [eg, stillbirth], interval >10 years between pregnancies) No  Indications for early GDM screening (FBS, A1C, Random CBG, GTT) First-degree relative with diabetes No BMI >30kg/m2 No Age > 35 No Previous birth of an infant weighing >=4000 g No Gestational diabetes mellitus in a previous pregnancy No Glycated hemoglobin >=5.7 percent (39 mmol/mol), impaired glucose tolerance, or impaired fasting glucose on previous testing No High-risk race/ethnicity (eg, African American, Latino, Native American, Asian American, Pacific Islander) Yes Previous stillbirth of unknown cause No Maternal birthweight > 9 lbs No History of cardiovascular disease No Hypertension or on therapy for hypertension No High-density lipoprotein cholesterol level <35 mg/dL (4.09 mmol/L) and/or a triglyceride level >250 mg/dL (8.11 mmol/L) No Polycystic ovary syndrome No Physical inactivity No Other clinical condition associated with insulin resistance (eg, severe obesity, acanthosis nigricans) No Current use of glucocorticoids No   Physical Exam:   Vitals:   08/10/23 1045  BP: 119/82  Pulse: 86  Weight: 151 lb 3.2 oz (68.6 kg)   Fetal Heart Rate (bpm): 156   General: well-developed, well-nourished female in no acute distress     Skin: normal coloration and turgor, no rashes  Neurologic: oriented, normal, negative, normal mood  Extremities: normal strength, tone, and muscle mass, ROM of all joints is normal  HEENT PERRLA, extraocular movement intact and sclera clear, anicteric  Neck supple and no masses  Cardiovascular: regular rate and rhythm  Respiratory:  no respiratory distress, normal breath sounds  Abdomen: soft, non-tender; bowel sounds normal; no masses,  no organomegaly       Assessment:     Pregnancy: [id number] Patient Active Problem List   Diagnosis Date Noted   Supervision of high risk pregnancy, antepartum 08/03/2023   Chronic constipation 08/19/2021   Irritable bowel syndrome 08/19/2021   Gastro-esophageal reflux disease with esophagitis 08/19/2021   Hematochezia 08/19/2021   Internal hemorrhoids 08/19/2021   Lower abdominal pain 08/19/2021   Dysmenorrhea 03/15/2021   Vaginal discharge 03/15/2021   Breast pain in female 03/15/2021   H. pylori infection 03/15/2021   Hypothyroidism 03/10/2021   Gastroesophageal reflux disease without esophagitis 03/10/2021   Palpitations 03/10/2021   Back pain affecting pregnancy in first trimester 07/25/2020   Abdominal pain during pregnancy in first trimester 07/25/2020   Eczema 07/10/2017   Pregnancy 04/12/2014   Atypical chest pain 05/15/2013   Left arm pain 05/15/2013  Starvation ketoacidosis 04/12/2013   Syncope 04/12/2013   Hypokalemia 04/12/2013   Protein-calorie malnutrition, severe (HCC) 04/12/2013   Prolonged Q-T interval on ECG 04/12/2013   Depression 04/16/2012   Underweight 03/20/2012   Bulimia nervosa 03/20/2012   Amenorrhea, secondary 03/20/2012     Plan:    1. Supervision of high risk pregnancy in first trimester  -Patient doing well.   2. [redacted] weeks gestation of pregnancy - Reviewed normal 1 & 2nd Trimester expectations.   3. Hyperthyroidism - Currently being managed by her PCP   4. Gastroesophageal reflux disease without esophagitis - Rx for Pepcid sent to outpatient pharmacy.   5. History of bulimia - Patient is currently making healthy lifestyle choices, reviewed that if this changes or she begins to restrict intake to notify providers.  - Ambulatory referral to Integrated Behavioral Health  6. Constipation, unspecified constipation type - Reviewed OTC options like stool softeners and Miralax.   Due to language barrier, an interpreter was present during the history-taking and subsequent  discussion (and for part of the physical exam) with this patient.    Initial labs drawn. Continue prenatal vitamins. Problem list reviewed and updated. Genetic Screening discussed, Panorama and Horizon: requested. Ultrasound discussed; fetal anatomic survey: planned. Anticipatory guidance about prenatal visits given including labs, ultrasounds, and testing. Weight gain recommendations per IOM guidelines reviewed: underweight/BMI 18.5 or less > 28 - 40 lbs; normal weight/BMI 18.5 - 24.9 > 25 - 35 lbs; overweight/BMI 25 - 29.9 > 15 - 25 lbs; obese/BMI  30 or more > 11 - 20 lbs. Discussed usage of the Babyscripts app for more information about pregnancy, and to track blood pressures. Also discussed usage of virtual visits as additional source of managing and completing prenatal visits.  Patient was encouraged to use MyChart to review results, send requests, and have questions addressed.   The nature of [facility] with multiple MDs and Advanced Practice Providers was explained to patient; also emphasized that residents, students are part of our team. Routine obstetric precautions reviewed. Encouraged to seek out care at our office or emergency room [facility] preferred) for urgent and/or emergent concerns. Return in about 4 weeks (around 09/07/2023) for LOB.     Sebrena Engh Danella Deis) Suzie Portela, MSN, CNM  [facility]  08/15/2023 2:08 PM

## 2023-08-29 ENCOUNTER — Encounter: Payer: Self-pay | Admitting: *Deleted

## 2023-09-06 ENCOUNTER — Encounter (HOSPITAL_COMMUNITY): Payer: Self-pay | Admitting: Obstetrics and Gynecology

## 2023-09-06 ENCOUNTER — Inpatient Hospital Stay (HOSPITAL_COMMUNITY)
Admission: AD | Admit: 2023-09-06 | Discharge: 2023-09-06 | Disposition: A | Discharge status: Home / Self Care | Payer: Self-pay | Attending: Obstetrics and Gynecology | Admitting: Obstetrics and Gynecology

## 2023-09-06 ENCOUNTER — Other Ambulatory Visit: Payer: Self-pay

## 2023-09-06 DIAGNOSIS — Z3A14 14 weeks gestation of pregnancy: Secondary | ICD-10-CM | POA: Insufficient documentation

## 2023-09-06 DIAGNOSIS — O0990 Supervision of high risk pregnancy, unspecified, unspecified trimester: Secondary | ICD-10-CM

## 2023-09-06 DIAGNOSIS — O26892 Other specified pregnancy related conditions, second trimester: Secondary | ICD-10-CM | POA: Insufficient documentation

## 2023-09-06 DIAGNOSIS — N949 Unspecified condition associated with female genital organs and menstrual cycle: Secondary | ICD-10-CM | POA: Insufficient documentation

## 2023-09-06 DIAGNOSIS — O26899 Other specified pregnancy related conditions, unspecified trimester: Secondary | ICD-10-CM

## 2023-09-06 LAB — URINALYSIS, ROUTINE W REFLEX MICROSCOPIC
Bacteria, UA: NONE SEEN
Bilirubin Urine: NEGATIVE
Glucose, UA: NEGATIVE mg/dL
Hgb urine dipstick: NEGATIVE
Ketones, ur: NEGATIVE mg/dL
Leukocytes,Ua: NEGATIVE
Nitrite: NEGATIVE
Protein, ur: NEGATIVE mg/dL
Specific Gravity, Urine: 1.012 (ref 1.005–1.030)
pH: 7 (ref 5.0–8.0)

## 2023-09-06 MED ORDER — CYCLOBENZAPRINE HCL 5 MG PO TABS
5.0000 mg | ORAL_TABLET | Freq: Three times a day (TID) | ORAL | 0 refills | Status: DC | PRN
  Filled 2023-09-06: qty 20, 7d supply, fill #0

## 2023-09-06 MED ORDER — CYCLOBENZAPRINE HCL 5 MG PO TABS
10.0000 mg | ORAL_TABLET | Freq: Once | ORAL | Status: DC
  Filled 2023-09-06: qty 2

## 2023-09-06 MED ORDER — ACETAMINOPHEN 500 MG PO TABS
1000.0000 mg | ORAL_TABLET | Freq: Once | ORAL | Status: AC
  Administered 2023-09-06: 1000 mg via ORAL
  Filled 2023-09-06: qty 2

## 2023-09-06 MED ORDER — POLYETHYLENE GLYCOL 3350 17 G PO PACK
17.0000 g | PACK | Freq: Every day | ORAL | Status: DC
  Administered 2023-09-06: 17 g via ORAL
  Filled 2023-09-06: qty 1

## 2023-09-06 NOTE — MAU Provider Note (Signed)
History     CSN: [id number]  Arrival date and time: 09/06/23 1007   None     Chief Complaint  Patient presents with   Abdominal Pain   [name] is a 37 y.o. at [redacted]w[redacted]d presenting to MAU reporting: While trying to have BM last night, feels she strained and has had a sharp pain in her right side since. Reports it in her lower right side. Took tylenol last night 500mg  at 0200 without relief. Reports that she has chronic constipation.     Abdominal Pain This is a new problem. The current episode started today. The onset quality is sudden. The pain is located in the RLQ. The pain is at a severity of 10/10. The quality of the pain is sharp. The abdominal pain radiates to the back. Associated symptoms include constipation. Pertinent negatives include no diarrhea, fever, frequency, nausea or vomiting.    OB History     Gravida  7   Para  3   Term  3   Preterm  0   AB  3   Living  3      SAB  3   IAB  0   Ectopic  0   Multiple      Live Births  3           Past Medical History:  Diagnosis Date   Bulimia nervosa    no current problems  (07/2022)   Eczema    GERD (gastroesophageal reflux disease)    Hyperthyroidism     Past Surgical History:  Procedure Laterality Date   COLONOSCOPY WITH PROPOFOL N/A 03/18/2016   Procedure: COLONOSCOPY WITH PROPOFOL;  Surgeon: Vida Rigger, MD;  Location: [facility] ENDOSCOPY;  Service: Endoscopy;  Laterality: N/A;   ESOPHAGOGASTRODUODENOSCOPY (EGD) WITH PROPOFOL N/A 09/22/2017   Procedure: ESOPHAGOGASTRODUODENOSCOPY (EGD) WITH PROPOFOL;  Surgeon: Vida Rigger, MD;  Location: [facility] ENDOSCOPY;  Service: Endoscopy;  Laterality: N/A;    Family History  Problem Relation Age of Onset   Healthy Mother    Diabetes Father    Hearing loss Neg Hx    Cancer Neg Hx    Heart failure Neg Hx     Social History   Tobacco Use   Smoking status: Never   Smokeless tobacco: Never  Vaping Use   Vaping status: Never Used  Substance  Use Topics   Alcohol use: Never   Drug use: Never    Allergies: No Known Allergies  No medications prior to admission.    Review of Systems  Constitutional:  Negative for chills, fatigue, fever and unexpected weight change.  Respiratory:  Negative for cough and shortness of breath.   Cardiovascular:  Negative for chest pain and palpitations.  Gastrointestinal:  Positive for abdominal pain and constipation. Negative for diarrhea, nausea and vomiting.  Genitourinary:  Negative for difficulty urinating, flank pain, frequency and urgency.   Physical Exam   Blood pressure 133/85, pulse 90, temperature 98.7 F (37.1 C), resp. rate 18, last menstrual period 05/28/2023, unknown if currently breastfeeding.  Physical Exam Vitals reviewed.  Constitutional:      Appearance: Normal appearance.  HENT:     Head: Normocephalic.  Cardiovascular:     Rate and Rhythm: Normal rate and regular rhythm.     Pulses: Normal pulses.     Heart sounds: Normal heart sounds.  Pulmonary:     Effort: Pulmonary effort is normal.     Breath sounds: Normal breath sounds.  Abdominal:  Palpations: Abdomen is soft.     Tenderness: There is abdominal tenderness in the right lower quadrant. There is no right CVA tenderness, left CVA tenderness or rebound. Negative signs include Murphy's sign, Rovsing's sign and McBurney's sign.     Comments: Gravid   Skin:    General: Skin is warm and dry.     Capillary Refill: Capillary refill takes less than 2 seconds.  Neurological:     Mental Status: She is alert and oriented to person, place, and time.  Psychiatric:        Mood and Affect: Mood normal.        Behavior: Behavior normal.        Thought Content: Thought content normal.        Judgment: Judgment normal.     Fetal Assessment 154 bpm via doppler  MAU Course   Results for orders placed or performed during the hospital encounter of 09/06/23 (from the past 24 hour(s))  Urinalysis, Routine w reflex  microscopic -Urine, Clean Catch     Status: Abnormal   Collection Time: 09/06/23 10:57 AM  Result Value Ref Range   Color, Urine STRAW (A) YELLOW   APPearance CLEAR CLEAR   Specific Gravity, Urine 1.012 1.005 - 1.030   pH 7.0 5.0 - 8.0   Glucose, UA NEGATIVE NEGATIVE mg/dL   Hgb urine dipstick NEGATIVE NEGATIVE   Bilirubin Urine NEGATIVE NEGATIVE   Ketones, ur NEGATIVE NEGATIVE mg/dL   Protein, ur NEGATIVE NEGATIVE mg/dL   Nitrite NEGATIVE NEGATIVE   Leukocytes,Ua NEGATIVE NEGATIVE   RBC / HPF 0-5 0 - 5 RBC/hpf   WBC, UA 0-5 0 - 5 WBC/hpf   Bacteria, UA NONE SEEN NONE SEEN   Squamous Epithelial / HPF 0-5 0 - 5 /HPF   No results found.  MDM PE Labs:  UA   Assessment and Plan  37yo  G7 [id number]  SIUP at 14.3 weeks Round ligament pain likely caused by straining.   - Exam findings discussed. Benign findings - Relief of pain from 10/10 to 5/10 with PO tylenol 1000mg . May repeat in 6 hours PRN. Prescription for flexeril sent.  - Keep ROB appointment 09/07/23.  - Return to MAU PRN. - Discharged home in stable condition.    Richardson Landry MSN, CNM 09/06/2023, 3:07 PM

## 2023-09-06 NOTE — MAU Note (Signed)
.  [name] is a 37 y.o. at [redacted]w[redacted]d here in MAU reporting: Went to [facility] last night to try to have a BM ans she strained to have it and started having a sharp pain in her right side that radiates toward her front. Pain is sharp and constant. LMP:  Onset of complaint: last night Pain score: 10     GMW:NUUV obtain in room Lab orders placed from triage:

## 2023-09-07 ENCOUNTER — Encounter: Payer: Self-pay | Admitting: Certified Nurse Midwife

## 2023-09-07 ENCOUNTER — Ambulatory Visit (INDEPENDENT_AMBULATORY_CARE_PROVIDER_SITE_OTHER): Payer: Self-pay | Admitting: Certified Nurse Midwife

## 2023-09-07 ENCOUNTER — Other Ambulatory Visit: Payer: Self-pay

## 2023-09-07 VITALS — BP 126/84 | HR 91 | Wt 159.0 lb

## 2023-09-07 DIAGNOSIS — O09529 Supervision of elderly multigravida, unspecified trimester: Secondary | ICD-10-CM | POA: Insufficient documentation

## 2023-09-07 DIAGNOSIS — R1031 Right lower quadrant pain: Secondary | ICD-10-CM

## 2023-09-07 DIAGNOSIS — Z3A14 14 weeks gestation of pregnancy: Secondary | ICD-10-CM

## 2023-09-07 DIAGNOSIS — O26892 Other specified pregnancy related conditions, second trimester: Secondary | ICD-10-CM

## 2023-09-07 DIAGNOSIS — O0990 Supervision of high risk pregnancy, unspecified, unspecified trimester: Secondary | ICD-10-CM

## 2023-09-07 DIAGNOSIS — O09522 Supervision of elderly multigravida, second trimester: Secondary | ICD-10-CM

## 2023-09-07 DIAGNOSIS — Z23 Encounter for immunization: Secondary | ICD-10-CM

## 2023-09-07 NOTE — Progress Notes (Signed)
   PRENATAL VISIT NOTE  Subjective:  [name] is a 37 y.o. [id number] at [redacted]w[redacted]d being seen today for ongoing prenatal care.  She is currently monitored for the following issues for this low-risk pregnancy and has Bulimia nervosa; Amenorrhea, secondary; Depression; Prolonged Q-T interval on ECG; Eczema; Hypothyroidism; Palpitations; H. pylori infection; Chronic constipation; Irritable bowel syndrome; Gastro-esophageal reflux disease with esophagitis; Hematochezia; Internal hemorrhoids; Supervision of high risk pregnancy, antepartum; and Advanced maternal age in multigravida on their problem list.  Patient reports that she was having RLQ pain yesterdat and was seen at MAU. Today she declined all pain and reports doing well. .  Contractions: Not present. Vag. Bleeding: None.  Movement: Absent. Denies leaking of fluid.   The following portions of the patient's history were reviewed and updated as appropriate: allergies, current medications, past family history, past medical history, past social history, past surgical history and problem list.   Objective:   Vitals:   09/07/23 1056  BP: 126/84  Pulse: 91  Weight: 159 lb (72.1 kg)    Fetal Status: Fetal Heart Rate (bpm): 145   Movement: Absent     General:  Alert, oriented and cooperative. Patient is in no acute distress.  Skin: Skin is warm and dry. No rash noted.   Cardiovascular: Normal heart rate noted  Respiratory: Normal respiratory effort, no problems with respiration noted  Abdomen: Soft, gravid, appropriate for gestational age.  Pain/Pressure: Present     Pelvic: Cervical exam deferred        Extremities: Normal range of motion.  Edema: None  Mental Status: Normal mood and affect. Normal behavior. Normal judgment and thought content.   Assessment and Plan:  Pregnancy: [id number] at [redacted]w[redacted]d 1. Supervision of high risk pregnancy, antepartum - Patient overall doing well.  - Flu vaccine trivalent PF, 6mos and  older(Flulaval,Afluria,Fluarix,Fluzone) - Declined Natera   2. Multigravida of advanced maternal age in second trimester 3. [redacted] weeks gestation of pregnancy - Patient concern for weight gain in pregnancy given hx of bulmia.  - Patient reports that she is not limited intake, but would like to speak with Asher Muir, message sent to [facility] to reach out again.  - Reviewed normal weight gain patterns for pregnancy and reminded patient that she is well within the guidelines at this time.   4. Right lower quadrant abdominal pain - No longer feeling pain and reports it was related to straining during defecation.   Preterm labor symptoms and general obstetric precautions including but not limited to vaginal bleeding, contractions, leaking of fluid and fetal movement were reviewed in detail with the patient. Please refer to After Visit Summary for other counseling recommendations.   Return in about 4 weeks (around 10/05/2023) for LOB.  Future Appointments  Date Time Provider Department Center  10/05/2023 10:15 AM Sue Lush, FNP [facility]  10/10/2023  8:30 AM [facility] US3 [facility]  12/12/2023  8:30 AM [name], MD [facility] None    Kamika Goodloe Danella Deis) Suzie Portela, MSN, CNM  Center for [facility]  09/07/2023 11:51 AM

## 2023-09-12 ENCOUNTER — Encounter: Payer: Self-pay | Admitting: *Deleted

## 2023-09-12 ENCOUNTER — Telehealth: Payer: Self-pay | Admitting: Family Medicine

## 2023-09-12 NOTE — Telephone Encounter (Signed)
I called Jadira back with Interpreter [employer]yr[employer] and left message we are returning her call and to call us back. Nancy Fetter

## 2023-09-12 NOTE — Telephone Encounter (Signed)
Patient called back and asked for nurse to call her back. I called her again with Interpre[employer] and I asked what procedure she is having done. She explained she is having some dental work Media planner. I informed her we can provide a letter stating she can have usual dental work and abdomen must be shielded for xray. I informed her I can put in Mychart or she can pick up. She states she will pick up letter.  Nancy Fetter

## 2023-09-12 NOTE — Telephone Encounter (Signed)
Patient is going to have a treatment done and needs approval from the provider, would like a nurse to call back

## 2023-10-04 NOTE — L&D Delivery Note (Addendum)
 Mahasin Riviere is a 38 y.o. [id number] s/p NSVD at [redacted]w[redacted]d. She was admitted for IOL due to GDM. Diagnosed with gestational hypertension during admission.   ROM: 5h 86m with clear fluid GBS Status: negative Maximum Maternal Temperature: 98.81F  Labor Progress: Progressed to complete following cytotec , Cooks catheter, and AROM.  Delivery Date/Time: 02/26/24 1623 Delivery: Immediately after getting epidural, pt had pressure and was noted by RN to have an anterior lip.  Arrived to room and pt was wanting to push. Lip easily reduced.  After about a 20 minute 2nd stage, head delivered OA. No nuchal cord present. Shoulder and body delivered in usual fashion. Infant with spontaneous cry, placed on mother's abdomen, dried and stimulated. Cord clamped x 2 after 1-minute delay, and cut by patient's daughter. Cord blood drawn. Placenta delivered spontaneously, intact, with 3-vessel cord. Cervix cleared of clots, fundus firm with massage and Pitocin . Labia, perineum, vagina, and cervix inspected; periurethral laceration found, hemostatic, not repaired.   Placenta: intact, to L&D Complications: none Lacerations: periurethral, not repaired EBL: Analgesia: epidural  Infant: female  1 min APGAR 8   weight pending  Ida Mains, SNM 02/26/2024, 4:39 PM  The above was performed under my direct supervision and guidance.

## 2023-10-05 ENCOUNTER — Other Ambulatory Visit: Payer: Self-pay

## 2023-10-05 ENCOUNTER — Ambulatory Visit: Payer: Self-pay | Admitting: Obstetrics and Gynecology

## 2023-10-05 VITALS — BP 143/87 | HR 91 | Wt 165.0 lb

## 2023-10-05 DIAGNOSIS — Z3A18 18 weeks gestation of pregnancy: Secondary | ICD-10-CM

## 2023-10-05 DIAGNOSIS — R030 Elevated blood-pressure reading, without diagnosis of hypertension: Secondary | ICD-10-CM

## 2023-10-05 DIAGNOSIS — O0990 Supervision of high risk pregnancy, unspecified, unspecified trimester: Secondary | ICD-10-CM

## 2023-10-05 DIAGNOSIS — O09522 Supervision of elderly multigravida, second trimester: Secondary | ICD-10-CM

## 2023-10-05 DIAGNOSIS — O0992 Supervision of high risk pregnancy, unspecified, second trimester: Secondary | ICD-10-CM

## 2023-10-05 DIAGNOSIS — R300 Dysuria: Secondary | ICD-10-CM

## 2023-10-05 LAB — POCT URINALYSIS DIP (DEVICE)
Bilirubin Urine: NEGATIVE
Glucose, UA: NEGATIVE mg/dL
Hgb urine dipstick: NEGATIVE
Ketones, ur: NEGATIVE mg/dL
Leukocytes,Ua: NEGATIVE
Nitrite: NEGATIVE
Protein, ur: NEGATIVE mg/dL
Specific Gravity, Urine: >=1.03 (ref 1.005–1.030)
Urobilinogen, UA: 0.2 mg/dL (ref 0.0–1.0)
pH: 6.5 (ref 5.0–8.0)

## 2023-10-05 MED ORDER — NITROFURANTOIN MONOHYD MACRO 100 MG PO CAPS
100.0000 mg | ORAL_CAPSULE | Freq: Two times a day (BID) | ORAL | 0 refills | Status: DC
  Filled 2023-10-05: qty 14, 7d supply, fill #0

## 2023-10-05 NOTE — Progress Notes (Signed)
   PRENATAL VISIT NOTE  Subjective:  [name] is a 38 y.o. [id number] at [redacted]w[redacted]d being seen today for ongoing prenatal care.  She is currently monitored for the following issues for this high-risk pregnancy and has Bulimia nervosa; Depression; Hypothyroidism; Palpitations; Gastro-esophageal reflux disease with esophagitis; Hematochezia; Supervision of high risk pregnancy, antepartum; and Advanced maternal age in multigravida on their problem list.  Patient reports  complaints of dysuria and suprapubic pain, denies itching/irritation . Seen by dentist, needs note to receive continued care with them  Contractions: Not present. Vag. Bleeding: None.  Movement: Absent. Denies leaking of fluid.   The following portions of the patient's history were reviewed and updated as appropriate: allergies, current medications, past family history, past medical history, past social history, past surgical history and problem list.   Objective:   Vitals:   10/05/23 1104 10/05/23 1117  BP: (!) 132/95 (!) 143/87  Pulse: 91   Weight: 165 lb (74.8 kg)     Fetal Status: Fetal Heart Rate (bpm): 145   Movement: Absent     General:  Alert, oriented and cooperative. Patient is in no acute distress.  Skin: Skin is warm and dry. No rash noted.   Cardiovascular: Normal heart rate noted  Respiratory: Normal respiratory effort, no problems with respiration noted  Abdomen: Soft, gravid, appropriate for gestational age.  Pain/Pressure: Present     Pelvic: Cervical exam deferred        Extremities: Normal range of motion.  Edema: None  Mental Status: Normal mood and affect. Normal behavior. Normal judgment and thought content.   Assessment and Plan:  Pregnancy: [id number] at [redacted]w[redacted]d 1. Supervision of high risk pregnancy, antepartum (Primary) FHR normal, doing well overall  2. Dysuria UA neg, will send for culture and treat given symptoms Follow up precautions discussed  3. Multigravida of advanced maternal age  in second trimester U/s 1/7  4. [redacted] weeks gestation of pregnancy Declined AFP Dental note provided today for her to have dental procedure  5. Elevated BP without diagnosis of hypertension Blood work today, no pec s&s. Precautions discussed  Blood pressure cuff given today to start checking at home - CBC - Protein / creatinine ratio, urine - Comp Met (CMET)   Preterm labor symptoms and general obstetric precautions including but not limited to vaginal bleeding, contractions, leaking of fluid and fetal movement were reviewed in detail with the patient. Please refer to After Visit Summary for other counseling recommendations.   Return in about 4 weeks (around 11/02/2023) for OB VISIT (MD or APP).  Future Appointments  Date Time Provider Department Center  10/10/2023  8:15 AM [facility] NURSE [facility]  10/10/2023  8:30 AM [facility] US3 [facility]  12/12/2023  8:30 AM Delbert Clam, MD [facility] None    Nidia Daring, FNP

## 2023-10-05 NOTE — Progress Notes (Signed)
 Pt reports dysuria, incomplete emptying, & mild pelvic pain since last week- no fever/chills

## 2023-10-06 LAB — COMPREHENSIVE METABOLIC PANEL
ALT: 30 [IU]/L (ref 0–32)
AST: 27 [IU]/L (ref 0–40)
Albumin: 3.9 g/dL (ref 3.9–4.9)
Alkaline Phosphatase: 89 [IU]/L (ref 44–121)
BUN/Creatinine Ratio: 16 (ref 9–23)
BUN: 7 mg/dL (ref 6–20)
Bilirubin Total: <0.2 mg/dL (ref 0.0–1.2)
CO2: 21 mmol/L (ref 20–29)
Calcium: 9.5 mg/dL (ref 8.7–10.2)
Chloride: 102 mmol/L (ref 96–106)
Creatinine, Ser: 0.45 mg/dL — ABNORMAL LOW (ref 0.57–1.00)
Globulin, Total: 2.8 g/dL (ref 1.5–4.5)
Glucose: 79 mg/dL (ref 70–99)
Potassium: 4.7 mmol/L (ref 3.5–5.2)
Sodium: 137 mmol/L (ref 134–144)
Total Protein: 6.7 g/dL (ref 6.0–8.5)
eGFR: 126 mL/min/{1.73_m2} (ref >59)

## 2023-10-06 LAB — CBC
Hematocrit: 39.8 % (ref 34.0–46.6)
Hemoglobin: 13.5 g/dL (ref 11.1–15.9)
MCH: 30.7 pg (ref 26.6–33.0)
MCHC: 33.9 g/dL (ref 31.5–35.7)
MCV: 91 fL (ref 79–97)
Platelets: 371 10*3/uL (ref 150–450)
RBC: 4.4 x10E6/uL (ref 3.77–5.28)
RDW: 13.1 % (ref 11.7–15.4)
WBC: 13.9 10*3/uL — ABNORMAL HIGH (ref 3.4–10.8)

## 2023-10-06 LAB — PROTEIN / CREATININE RATIO, URINE
Creatinine, Urine: 58.9 mg/dL
Protein, Ur: 6.2 mg/dL
Protein/Creat Ratio: 105 mg/g{creat} (ref 0–200)

## 2023-10-07 LAB — URINE CULTURE: Organism ID, Bacteria: NO GROWTH

## 2023-10-10 ENCOUNTER — Ambulatory Visit: Payer: Self-pay | Attending: Certified Nurse Midwife

## 2023-10-10 ENCOUNTER — Other Ambulatory Visit: Payer: Self-pay | Admitting: *Deleted

## 2023-10-10 ENCOUNTER — Ambulatory Visit: Payer: Self-pay | Admitting: *Deleted

## 2023-10-10 ENCOUNTER — Other Ambulatory Visit: Payer: Self-pay

## 2023-10-10 VITALS — BP 135/91 | HR 104

## 2023-10-10 DIAGNOSIS — O09522 Supervision of elderly multigravida, second trimester: Secondary | ICD-10-CM

## 2023-10-10 DIAGNOSIS — O0991 Supervision of high risk pregnancy, unspecified, first trimester: Secondary | ICD-10-CM | POA: Insufficient documentation

## 2023-10-10 DIAGNOSIS — O0990 Supervision of high risk pregnancy, unspecified, unspecified trimester: Secondary | ICD-10-CM | POA: Insufficient documentation

## 2023-10-10 DIAGNOSIS — O4452 Low lying placenta with hemorrhage, second trimester: Secondary | ICD-10-CM

## 2023-10-23 ENCOUNTER — Other Ambulatory Visit: Payer: Self-pay

## 2023-10-24 ENCOUNTER — Other Ambulatory Visit: Payer: Self-pay

## 2023-11-02 ENCOUNTER — Other Ambulatory Visit: Payer: Self-pay

## 2023-11-02 ENCOUNTER — Ambulatory Visit (INDEPENDENT_AMBULATORY_CARE_PROVIDER_SITE_OTHER): Payer: Self-pay | Admitting: Obstetrics and Gynecology

## 2023-11-02 VITALS — BP 136/87 | HR 101

## 2023-11-02 DIAGNOSIS — O0992 Supervision of high risk pregnancy, unspecified, second trimester: Secondary | ICD-10-CM

## 2023-11-02 DIAGNOSIS — Z3A22 22 weeks gestation of pregnancy: Secondary | ICD-10-CM

## 2023-11-02 DIAGNOSIS — O09522 Supervision of elderly multigravida, second trimester: Secondary | ICD-10-CM

## 2023-11-02 DIAGNOSIS — O0990 Supervision of high risk pregnancy, unspecified, unspecified trimester: Secondary | ICD-10-CM

## 2023-11-02 DIAGNOSIS — O4442 Low lying placenta NOS or without hemorrhage, second trimester: Secondary | ICD-10-CM

## 2023-11-02 DIAGNOSIS — O4440 Low lying placenta NOS or without hemorrhage, unspecified trimester: Secondary | ICD-10-CM

## 2023-11-02 DIAGNOSIS — N898 Other specified noninflammatory disorders of vagina: Secondary | ICD-10-CM

## 2023-11-02 MED ORDER — FLUCONAZOLE 150 MG PO TABS
150.0000 mg | ORAL_TABLET | Freq: Once | ORAL | 0 refills | Status: AC
  Filled 2023-11-02: qty 2, 2d supply, fill #0

## 2023-11-02 NOTE — Progress Notes (Signed)
   PRENATAL VISIT NOTE  Subjective:  [name] is a 38 y.o. [id number] at [redacted]w[redacted]d being seen today for ongoing prenatal care.  She is currently monitored for the following issues for this high-risk pregnancy and has Bulimia nervosa; Depression; Hypothyroidism; Palpitations; Gastro-esophageal reflux disease with esophagitis; Hematochezia; Supervision of high risk pregnancy, antepartum; and Advanced maternal age in multigravida on their problem list.  Patient reports  leg pain and tingling  .  Contractions: Not present. Vag. Bleeding: None.  Movement: Present. Denies leaking of fluid.   The following portions of the patient's history were reviewed and updated as appropriate: allergies, current medications, past family history, past medical history, past social history, past surgical history and problem list.   Objective:   Vitals:   11/02/23 0947  BP: 136/87  Pulse: (!) 101    Fetal Status: Fetal Heart Rate (bpm): 147   Movement: Present     General:  Alert, oriented and cooperative. Patient is in no acute distress.  Skin: Skin is warm and dry. No rash noted.   Cardiovascular: Normal heart rate noted  Respiratory: Normal respiratory effort, no problems with respiration noted  Abdomen: Soft, gravid, appropriate for gestational age.  Pain/Pressure: Present     Pelvic: Cervical exam deferred        Extremities: Normal range of motion.  Edema: None  Mental Status: Normal mood and affect. Normal behavior. Normal judgment and thought content.   Assessment and Plan:  Pregnancy: [id number] at [redacted]w[redacted]d 1. Supervision of high risk pregnancy, antepartum (Primary) BP and FHR normal Doing well, feeling regular movement    2. Multigravida of advanced maternal age in second trimester Following MFM Continue ASA  Continue checking BPs at home, precautions discussed when to follow up   3. [redacted] weeks gestation of pregnancy Anticipatory guidance regarding GTT and labs next visit  Support  measures discussed for pain, including stretches, support belt given. Follow up if worsens  4. Low-lying placenta 1/7 u/s Posterior, low-lying, 0.6 cm from int os  Pelvic rest Bleeding precautions given Follow up 3/18  Preterm labor symptoms and general obstetric precautions including but not limited to vaginal bleeding, contractions, leaking of fluid and fetal movement were reviewed in detail with the patient. Please refer to After Visit Summary for other counseling recommendations.   Return in about 4 weeks (around 11/30/2023) for OB VISIT (MD or APP), 2 hr GTT.  Future Appointments  Date Time Provider Department Center  12/12/2023  8:30 AM [name], MD [facility] None  12/19/2023  3:30 PM [facility] US1 [facility]    Albertine Grates, FNP

## 2023-11-03 LAB — POCT URINALYSIS DIP (DEVICE)
Bilirubin Urine: NEGATIVE
Glucose, UA: NEGATIVE mg/dL
Hgb urine dipstick: NEGATIVE
Ketones, ur: NEGATIVE mg/dL
Leukocytes,Ua: NEGATIVE
Nitrite: NEGATIVE
Protein, ur: NEGATIVE mg/dL
Specific Gravity, Urine: 1.025 (ref 1.005–1.030)
Urobilinogen, UA: 0.2 mg/dL (ref 0.0–1.0)
pH: 6 (ref 5.0–8.0)

## 2023-11-29 ENCOUNTER — Other Ambulatory Visit: Payer: Self-pay

## 2023-11-29 DIAGNOSIS — O0990 Supervision of high risk pregnancy, unspecified, unspecified trimester: Secondary | ICD-10-CM

## 2023-11-30 ENCOUNTER — Ambulatory Visit (INDEPENDENT_AMBULATORY_CARE_PROVIDER_SITE_OTHER): Payer: Self-pay | Admitting: Obstetrics and Gynecology

## 2023-11-30 ENCOUNTER — Other Ambulatory Visit: Payer: Self-pay

## 2023-11-30 VITALS — BP 134/87 | HR 96 | Wt 180.5 lb

## 2023-11-30 DIAGNOSIS — E039 Hypothyroidism, unspecified: Secondary | ICD-10-CM

## 2023-11-30 DIAGNOSIS — O4442 Low lying placenta NOS or without hemorrhage, second trimester: Secondary | ICD-10-CM

## 2023-11-30 DIAGNOSIS — O0992 Supervision of high risk pregnancy, unspecified, second trimester: Secondary | ICD-10-CM

## 2023-11-30 DIAGNOSIS — Z23 Encounter for immunization: Secondary | ICD-10-CM

## 2023-11-30 DIAGNOSIS — O0990 Supervision of high risk pregnancy, unspecified, unspecified trimester: Secondary | ICD-10-CM

## 2023-11-30 DIAGNOSIS — O4440 Low lying placenta NOS or without hemorrhage, unspecified trimester: Secondary | ICD-10-CM | POA: Insufficient documentation

## 2023-11-30 DIAGNOSIS — Z3A26 26 weeks gestation of pregnancy: Secondary | ICD-10-CM

## 2023-11-30 DIAGNOSIS — O09522 Supervision of elderly multigravida, second trimester: Secondary | ICD-10-CM

## 2023-11-30 NOTE — Progress Notes (Signed)
   PRENATAL VISIT NOTE  Subjective:  [name] [name] is a 38 y.o. [phone] at [redacted]w[redacted]d being seen today for ongoing prenatal care.  She is currently monitored for the following issues for this high-risk pregnancy and has Bulimia nervosa; Depression; Hypothyroidism; Palpitations; Gastro-esophageal reflux disease with esophagitis; Hematochezia; Supervision of high risk pregnancy, antepartum; Advanced maternal age in multigravida; and Low-lying placenta on their problem list.  Patient doing well with no acute concerns today. She reports  intermittent RLQ pain, somewhat responsive to tylenol.  No nausea or vomiting. .   [name]. Bleeding: None.  Movement: Present. Denies leaking of fluid.   The following portions of the patient's history were reviewed and updated as appropriate: allergies, current medications, past family history, past medical history, past social history, past surgical history and problem list. Problem list updated.  Objective:   Vitals:   11/30/23 0829  BP: 134/87  Pulse: 96  Weight: 180 lb 8 oz (81.9 kg)    Fetal Status: Fetal Heart Rate (bpm): 148 Fundal Height: 27 cm Movement: Present     General:  Alert, oriented and cooperative. Patient is in no acute distress.  Skin: Skin is warm and dry. No rash noted.   Cardiovascular: Normal heart rate noted  Respiratory: Normal respiratory effort, no problems with respiration noted  Abdomen: Soft, gravid, appropriate for gestational age.  Pain/Pressure: Present     Pelvic: Cervical exam deferred        Extremities: Normal range of motion.  Edema: None  Mental Status:  Normal mood and affect. Normal behavior. Normal judgment and thought content.   Assessment and Plan:  Pregnancy: [id number] at [redacted]w[redacted]d  1. [redacted] weeks gestation of pregnancy (Primary)  - Tdap vaccine greater than or equal to 7yo IM  2. Hypothyroidism, unspecified type Pt is taking synthroid, will recheck TSH today - TSH + free T4  3. Multigravida of advanced  maternal age in second trimester   4. Supervision of high risk pregnancy, antepartum Continue routine prenatal care 2 hour GTT and third trimester labs today - Tdap vaccine greater than or equal to 7yo IM  5. Low-lying placenta Pt to have repeat scan 12/19/23 to assess location  Preterm labor symptoms and general obstetric precautions including but not limited to vaginal bleeding, contractions, leaking of fluid and fetal movement were reviewed in detail with the patient.  Please refer to After Visit Summary for other counseling recommendations.   Return in about 3 weeks (around 12/21/2023) for ROB, in person.   Mariel Aloe, MD Faculty Attending Center for [facility]

## 2023-12-01 LAB — CBC
Hematocrit: 38.1 % (ref 34.0–46.6)
Hemoglobin: 13.1 g/dL (ref 11.1–15.9)
MCH: 31.2 pg (ref 26.6–33.0)
MCHC: 34.4 g/dL (ref 31.5–35.7)
MCV: 91 fL (ref 79–97)
Platelets: 323 10*3/uL (ref 150–450)
RBC: 4.2 x10E6/uL (ref 3.77–5.28)
RDW: 13.1 % (ref 11.7–15.4)
WBC: 12.1 10*3/uL — ABNORMAL HIGH (ref 3.4–10.8)

## 2023-12-01 LAB — GLUCOSE TOLERANCE, 2 HOURS W/ 1HR
Glucose, 1 hour: 187 mg/dL — ABNORMAL HIGH (ref 70–179)
Glucose, 2 hour: 133 mg/dL (ref 70–152)
Glucose, Fasting: 89 mg/dL (ref 70–91)

## 2023-12-01 LAB — RPR: RPR Ser Ql: NONREACTIVE

## 2023-12-01 LAB — TSH+FREE T4
Free T4: 0.72 ng/dL — ABNORMAL LOW (ref 0.82–1.77)
TSH: 2.84 u[IU]/mL (ref 0.450–4.500)

## 2023-12-01 LAB — HIV ANTIBODY (ROUTINE TESTING W REFLEX): HIV Screen 4th Generation wRfx: NONREACTIVE

## 2023-12-03 ENCOUNTER — Encounter: Payer: Self-pay | Admitting: Obstetrics and Gynecology

## 2023-12-03 DIAGNOSIS — O24419 Gestational diabetes mellitus in pregnancy, unspecified control: Secondary | ICD-10-CM | POA: Insufficient documentation

## 2023-12-04 ENCOUNTER — Other Ambulatory Visit: Payer: Self-pay

## 2023-12-04 DIAGNOSIS — O24419 Gestational diabetes mellitus in pregnancy, unspecified control: Secondary | ICD-10-CM

## 2023-12-04 MED ORDER — GLUCOSE BLOOD VI STRP
ORAL_STRIP | 12 refills | Status: DC
  Filled 2023-12-04: qty 100, 100d supply, fill #0
  Filled 2023-12-25 – 2023-12-26 (×2): qty 100, 100d supply, fill #1
  Filled 2023-12-28: qty 100, 30d supply, fill #1
  Filled 2024-01-15 – 2024-01-16 (×4): qty 100, 30d supply, fill #2
  Filled 2024-02-01 – 2024-02-05 (×4): qty 100, 30d supply, fill #3

## 2023-12-04 MED ORDER — TRUE METRIX METER W/DEVICE KIT
1.0000 | PACK | Freq: Four times a day (QID) | 0 refills | Status: DC
  Filled 2023-12-04: qty 1, 30d supply, fill #0

## 2023-12-04 MED ORDER — TRUEPLUS LANCETS 28G MISC
12 refills | Status: DC
  Filled 2023-12-04: qty 100, 100d supply, fill #0

## 2023-12-04 NOTE — Progress Notes (Signed)
 TC to pt using spanish interpretor. Informed of GDM, supplies being sent, and referral.

## 2023-12-05 ENCOUNTER — Telehealth: Payer: Self-pay | Admitting: Family Medicine

## 2023-12-05 ENCOUNTER — Other Ambulatory Visit: Payer: Self-pay

## 2023-12-07 ENCOUNTER — Encounter: Payer: Self-pay | Attending: Obstetrics and Gynecology | Admitting: Dietician

## 2023-12-07 ENCOUNTER — Ambulatory Visit: Payer: Self-pay

## 2023-12-07 ENCOUNTER — Other Ambulatory Visit: Payer: Self-pay

## 2023-12-07 DIAGNOSIS — Z3A27 27 weeks gestation of pregnancy: Secondary | ICD-10-CM

## 2023-12-07 DIAGNOSIS — O24419 Gestational diabetes mellitus in pregnancy, unspecified control: Secondary | ICD-10-CM

## 2023-12-07 DIAGNOSIS — Z713 Dietary counseling and surveillance: Secondary | ICD-10-CM | POA: Insufficient documentation

## 2023-12-07 DIAGNOSIS — Z3A37 37 weeks gestation of pregnancy: Secondary | ICD-10-CM | POA: Insufficient documentation

## 2023-12-07 NOTE — Progress Notes (Signed)
 Patient was seen for Gestational Diabetes on 12/07/2023   Start time 1118 and End time 1224  Estimated due date: 03/03/2024; [redacted]w[redacted]d  Spanish Interpreter id: [id number]  Clinical: Medications:  Current Outpatient Medications:    aspirin EC 81 MG tablet, Take 81 mg by mouth daily. Swallow whole., Disp: , Rfl:    Blood Glucose Monitoring Suppl (TRUE METRIX METER) w/Device KIT, Please check blood glucose 4 times a day. Once in the morning before eating anything (fasting), and 2 hours after breakfast, lunch, and dinner., Disp: 1 kit, Rfl: 0   glucose blood test strip, Use as instructed, Disp: 100 each, Rfl: 12   levothyroxine (SYNTHROID) 25 MCG tablet, Take 1 tablet (25 mcg total) by mouth daily., Disp: 90 tablet, Rfl: 1   Prenatal Vit-Fe Fumarate-FA (PRENATAL VITAMIN PO), Take by mouth., Disp: , Rfl:    TRUEplus Lancets 28G MISC, Use as instructed, Disp: 100 each, Rfl: 12  Medical History:  Past Medical History:  Diagnosis Date   Bulimia nervosa    no current problems  (07/2022)   Eczema    GERD (gastroesophageal reflux disease)    Hyperthyroidism     Labs: OGTT fasting: 89, 1 hour 187, 2 hour 133 mg/dL  Lab Results  Component Value Date   HGBA1C 5.2 08/03/2023     Dietary and Lifestyle History: Pt presents today alone with lancets, meter and strips. Pt denies previous GDM. Pt reports typical intake of 2 meals and 1 snack daily and occasional intake of sugary sweetened beverages. Pt states a family history of diabetes. All Pt's questions were answered during this encounter.      Physical Activity: go to park/ walking 2-3 days weekly for 30-60 minutes  Stress: 6 out of 10 / self care includes: go outside Sleep: good  24 hr Recall:  First Meal: eggs, spinach, salsa, 1 slices of whole wheat bread, handful of berries, coffee with 1% milk ~ 9:30 Snack:  Second meal: 11a-12p: fruits Snack: Third meal: beef, 2 small home made oatmeal tortillas, avocado and cheese Snack: banana or smoothie  with 1%, banana, 1/2 teaspoon of sugar  Beverages: water, pepsi, coffee with 1% milk, home made green juice with apple   NUTRITION INTERVENTION  Nutrition education (E-1) on the following topics:   Initial Follow-up  [x]  []  Definition of Gestational Diabetes [x]  []  Why dietary management is important in controlling blood glucose [x]  []  Effects each nutrient has on blood glucose levels [x]  []  Simple carbohydrates vs complex carbohydrates [x]  []  Fluid intake [x]  []  Creating a balanced meal plan [x]  []  Carbohydrate counting  [x]  []  When to check blood glucose levels [x]  []  Proper blood glucose monitoring techniques [x]  []  Effect of stress and stress reduction techniques  [x]  []  Exercise effect on blood glucose levels, appropriate exercise during pregnancy []  []  Importance of limiting caffeine and abstaining from alcohol and smoking [x]  []  Medications used for blood sugar control during pregnancy [x]  []  Hypoglycemia and rule of 15 [x]  []  Postpartum self care     Patient has a meter prior to visit. Patient is instructed to test pre breakfast and 2 hours after each meal. CBG: 92 mg/dL, reported 2 hour post prandial per Pt  Patient instructed to monitor glucose levels: QID  FBS: 60 - <= 95 mg/dL; 2 hour: <= 045 mg/dL  Patient received handouts: Nutrition Diabetes and Pregnancy Carbohydrate Counting List Blood glucose log Snack ideas for diabetes during pregnancy  Patient will be seen for follow-up as needed.

## 2023-12-12 ENCOUNTER — Ambulatory Visit: Payer: Self-pay | Attending: Family Medicine | Admitting: Family Medicine

## 2023-12-12 ENCOUNTER — Encounter: Payer: Self-pay | Admitting: Family Medicine

## 2023-12-12 VITALS — BP 123/85 | HR 92 | Ht 63.0 in | Wt 182.0 lb

## 2023-12-12 DIAGNOSIS — Z3A27 27 weeks gestation of pregnancy: Secondary | ICD-10-CM

## 2023-12-12 DIAGNOSIS — Z3493 Encounter for supervision of normal pregnancy, unspecified, third trimester: Secondary | ICD-10-CM

## 2023-12-12 DIAGNOSIS — E039 Hypothyroidism, unspecified: Secondary | ICD-10-CM

## 2023-12-12 NOTE — Progress Notes (Signed)
 Subjective:  Patient ID: [name], female    DOB: 12/16/1985  Age: 38 y.o. MRN: [id number]  CC: No chief complaint on file.     Discussed the use of AI scribe software for clinical note transcription with the patient, who gave verbal consent to proceed.  History of Present Illness The patient with a history of hypothyroidism, who is currently pregnant, presents for a routine check-up. She reports that she is already attending regular OB check-ups at a [facility]. She is managing with prenatal care. She is taking a multivitamin and continues to take levothyroxine for her thyroid condition. She denies any abnormal bleeding or constipation. She is maintaining a good appetite and is engaging in regular exercise, such as walking.    Past Medical History:  Diagnosis Date   Bulimia nervosa    no current problems  (07/2022)   Eczema    GERD (gastroesophageal reflux disease)    Hyperthyroidism     Past Surgical History:  Procedure Laterality Date   COLONOSCOPY WITH PROPOFOL N/A 03/18/2016   Procedure: COLONOSCOPY WITH PROPOFOL;  Surgeon: Vida Rigger, MD;  Location: [facility] ENDOSCOPY;  Service: Endoscopy;  Laterality: N/A;   ESOPHAGOGASTRODUODENOSCOPY (EGD) WITH PROPOFOL N/A 09/22/2017   Procedure: ESOPHAGOGASTRODUODENOSCOPY (EGD) WITH PROPOFOL;  Surgeon: Vida Rigger, MD;  Location: [facility] ENDOSCOPY;  Service: Endoscopy;  Laterality: N/A;   NO PAST SURGERIES      Family History  Problem Relation Age of Onset   Healthy Mother    Diabetes Father    Hearing loss Neg Hx    Cancer Neg Hx    Heart failure Neg Hx    Heart disease Neg Hx    Hypertension Neg Hx     Social History   Socioeconomic History   Marital status: Married    Spouse name: Not on file   Number of children: Not on file   Years of education: Not on file   Highest education level: Not on file  Occupational History   Not on file  Tobacco Use   Smoking status: Never   Smokeless tobacco: Never  Vaping  Use   Vaping status: Never Used  Substance and Sexual Activity   Alcohol use: Never   Drug use: Never   Sexual activity: Yes    Birth control/protection: None  Other Topics Concern   Not on file  Social History Narrative   ** Merged History Encounter **       ** Merged History Encounter **       Lives in [place] since 2005 with husband and 2 children (2011, 2008). Moved from [place]. Spanish speaking.    Social Drivers of Corporate investment banker Strain: Not on file  Food Insecurity: No Food Insecurity (12/07/2023)   Hunger Vital Sign    Worried About Running Out of Food in the Last Year: Never true    Ran Out of Food in the Last Year: Never true  Transportation Needs: No Transportation Needs (08/03/2023)   PRAPARE - Administrator, Civil Service (Medical): No    Lack of Transportation (Non-Medical): No  Physical Activity: Not on file  Stress: Not on file  Social Connections: Not on file    No Known Allergies  Outpatient Medications Prior to Visit  Medication Sig Dispense Refill   aspirin EC 81 MG tablet Take 81 mg by mouth daily. Swallow whole.     Blood Glucose Monitoring Suppl (TRUE METRIX METER) w/Device KIT  Please check blood glucose 4 times a day. Once in the morning before eating anything (fasting), and 2 hours after breakfast, lunch, and dinner. 1 kit 0   glucose blood test strip Use as instructed 100 each 12   levothyroxine (SYNTHROID) 25 MCG tablet Take 1 tablet (25 mcg total) by mouth daily. 90 tablet 1   Prenatal Vit-Fe Fumarate-FA (PRENATAL VITAMIN PO) Take by mouth.     TRUEplus Lancets 28G MISC Use as instructed 100 each 12   No facility-administered medications prior to visit.     ROS Review of Systems  Constitutional:  Negative for activity change and appetite change.  HENT:  Negative for sinus pressure and sore throat.   Respiratory:  Negative for chest tightness, shortness of breath and wheezing.   Cardiovascular:   Negative for chest pain and palpitations.  Gastrointestinal:  Negative for abdominal distention, abdominal pain and constipation.  Genitourinary: Negative.   Musculoskeletal: Negative.   Psychiatric/Behavioral:  Negative for behavioral problems and dysphoric mood.     Objective:  BP 123/85   Pulse 92   Ht 5\' 3"  (1.6 m)   Wt 182 lb (82.6 kg)   LMP 05/28/2023 (Exact Date)   SpO2 98%   BMI 32.24 kg/m      12/12/2023    8:41 AM 11/30/2023    8:29 AM 11/02/2023    9:47 AM  BP/Weight  Systolic BP 123 134 136  Diastolic BP 85 87 87  Wt. (Lbs) 182 180.5   BMI 32.24 kg/m2 31.97 kg/m2       Physical Exam Constitutional:      Appearance: She is well-developed.  Cardiovascular:     Rate and Rhythm: Normal rate.     Heart sounds: Normal heart sounds. No murmur heard. Pulmonary:     Effort: Pulmonary effort is normal.     Breath sounds: Normal breath sounds. No wheezing or rales.  Chest:     Chest wall: No tenderness.  Abdominal:     General: Bowel sounds are normal. There is no distension.     Palpations: Abdomen is soft. There is no mass.     Tenderness: There is no abdominal tenderness.  Musculoskeletal:        General: Normal range of motion.     Right lower leg: No edema.     Left lower leg: No edema.  Neurological:     Mental Status: She is alert and oriented to person, place, and time.  Psychiatric:        Mood and Affect: Mood normal.        Latest Ref Rng & Units 10/05/2023   12:31 PM 12/12/2022   10:02 AM 12/07/2021    9:44 AM  CMP  Glucose 70 - 99 mg/dL 79  97  83   BUN 6 - 20 mg/dL 7  8  10    Creatinine 0.57 - 1.00 mg/dL 1.61  0.96  0.45   Sodium 134 - 144 mmol/L 137  138  139   Potassium 3.5 - 5.2 mmol/L 4.7  4.3  4.2   Chloride 96 - 106 mmol/L 102  104  103   CO2 20 - 29 mmol/L 21  21  20    Calcium 8.7 - 10.2 mg/dL 9.5  9.3  9.4   Total Protein 6.0 - 8.5 g/dL 6.7  7.1    Total Bilirubin 0.0 - 1.2 mg/dL <4.0  0.3    Alkaline Phos 44 - 121 IU/L 89  95  AST 0 - 40 IU/L 27  16    ALT 0 - 32 IU/L 30  12      Lipid Panel     Component Value Date/Time   CHOL 206 (H) 12/12/2022 1002   TRIG 106 12/12/2022 1002   HDL 61 12/12/2022 1002   LDLCALC 126 (H) 12/12/2022 1002    CBC    Component Value Date/Time   WBC 12.1 (H) 11/30/2023 0818   WBC 10.8 (H) 08/09/2022 1323   RBC 4.20 11/30/2023 0818   RBC 4.65 08/09/2022 1323   HGB 13.1 11/30/2023 0818   HCT 38.1 11/30/2023 0818   PLT 323 11/30/2023 0818   MCV 91 11/30/2023 0818   MCH 31.2 11/30/2023 0818   MCH 30.5 08/09/2022 1323   MCHC 34.4 11/30/2023 0818   MCHC 35.5 08/09/2022 1323   RDW 13.1 11/30/2023 0818   LYMPHSABS 2.0 08/03/2023 1315   MONOABS 0.3 10/02/2017 1139   EOSABS 0.0 08/03/2023 1315   BASOSABS 0.0 08/03/2023 1315    Lab Results  Component Value Date   HGBA1C 5.2 08/03/2023       Assessment & Plan Third trimester pregnancy Currently pregnant, receiving prenatal care. No complications reported. Advised to maintain activity level. - Continue prenatal care with OB at the [facility] hospital. - Encourage daily walking. - Schedule follow-up post-delivery for further management.  Acquired hypothyroidism On levothyroxine currently managed by OB/GYN.  -Management by PCP to resume post-pregnancy. - Continue levothyroxine as prescribed.       No orders of the defined types were placed in this encounter.   Follow-up: Return for Medical conditions with PCP after delivery.       [name], MD, FAAFP. [facility] [place], [place] [phone]   12/12/2023, 8:59 AM

## 2023-12-12 NOTE — Patient Instructions (Signed)
 VISIT SUMMARY:  You had a routine check-up today. You are currently pregnant and attending regular prenatal check-ups at a women's hospital. You reported some discomfort due to the pregnancy but are managing well with prenatal care. You are taking a multivitamin and levothyroxine for your thyroid condition. You are maintaining a good appetite and engaging in regular exercise.  YOUR PLAN:  -PREGNANCY: You are currently pregnant and receiving prenatal care with no complications reported. It is important to maintain your activity level, so please continue your daily walking. We will schedule a follow-up appointment post-delivery for further management.  -ACQUIRED HYPOTHYROIDISM: Acquired hypothyroidism is a condition where your thyroid does not produce enough hormones. You are currently managing this with levothyroxine. Please continue taking your medication as prescribed, and we will resume further management after your pregnancy.  INSTRUCTIONS:  Please continue your prenatal care with your OB at the [facility] hospital. We will schedule a follow-up appointment post-delivery for further management of your thyroid condition.

## 2023-12-19 ENCOUNTER — Ambulatory Visit: Payer: Self-pay | Attending: Maternal & Fetal Medicine

## 2023-12-19 ENCOUNTER — Ambulatory Visit: Payer: Self-pay | Attending: Obstetrics | Admitting: Obstetrics

## 2023-12-19 DIAGNOSIS — O99343 Other mental disorders complicating pregnancy, third trimester: Secondary | ICD-10-CM

## 2023-12-19 DIAGNOSIS — Z3A29 29 weeks gestation of pregnancy: Secondary | ICD-10-CM

## 2023-12-19 DIAGNOSIS — O09523 Supervision of elderly multigravida, third trimester: Secondary | ICD-10-CM

## 2023-12-19 DIAGNOSIS — O4452 Low lying placenta with hemorrhage, second trimester: Secondary | ICD-10-CM | POA: Insufficient documentation

## 2023-12-19 DIAGNOSIS — O4453 Low lying placenta with hemorrhage, third trimester: Secondary | ICD-10-CM

## 2023-12-19 DIAGNOSIS — O4443 Low lying placenta NOS or without hemorrhage, third trimester: Secondary | ICD-10-CM

## 2023-12-19 DIAGNOSIS — O24410 Gestational diabetes mellitus in pregnancy, diet controlled: Secondary | ICD-10-CM

## 2023-12-19 DIAGNOSIS — F502 Bulimia nervosa, unspecified: Secondary | ICD-10-CM

## 2023-12-19 DIAGNOSIS — O09522 Supervision of elderly multigravida, second trimester: Secondary | ICD-10-CM | POA: Insufficient documentation

## 2023-12-19 DIAGNOSIS — O24419 Gestational diabetes mellitus in pregnancy, unspecified control: Secondary | ICD-10-CM

## 2023-12-19 DIAGNOSIS — E039 Hypothyroidism, unspecified: Secondary | ICD-10-CM

## 2023-12-19 DIAGNOSIS — O99283 Endocrine, nutritional and metabolic diseases complicating pregnancy, third trimester: Secondary | ICD-10-CM

## 2023-12-19 NOTE — Progress Notes (Signed)
 MFM Consult Note  [name] is currently at 29 weeks and 2 days.  She has been followed due to advanced maternal age (38 years old), hypothyroidism, and recently diagnosed diet-controlled gestational diabetes.    A low-lying placenta was noted on her last ultrasound exam.    She denies any problems since her last exam and reports mostly normal glycemic control.  On today's exam, the overall EFW of 3 pounds 3 ounces measures at the 56 percentile.  There was normal amniotic fluid noted.    The previously noted low-lying placenta has resolved.  The patient was advised that she may attempt a vaginal delivery at term should she desire.    The following were discussed during today's consultation:  Gestational diabetes   The implications and management of diabetes in pregnancy was discussed in detail with the patient.    She was advised to monitor her fingersticks 4 times daily (fasting and 2 hours after each meal).  However, as the patient reports that she does not eat lunch, she will monitor her fingerstick values 3 times a day.  She was advised that our goals for her fingerstick values are fasting values of 90-95 or less and two-hour postprandial values of 120 or less.    Should the majority of her fingerstick results be above these values, she may have to be started on insulin or metformin to help her achieve better glycemic control.   The patient was advised that getting her fingerstick values as close to these goals as possible would provide her with the most optimal obstetrical outcome.  Weekly fetal testing should be started at 32 weeks should she require insulin or metformin for treatment.  The patient was advised that delivery for well-controlled diabetes in pregnancy is usually recommended at around 39 weeks.    A follow-up growth scan was scheduled in 5 weeks.    The patient stated that all of her questions were answered today.    All conversations were held with  the patient today with the help of a Spanish interpreter.  A total of 20 minutes was spent counseling and coordinating the care for this patient.  Greater than 50% of the time was spent in direct face-to-face contact.

## 2023-12-20 ENCOUNTER — Other Ambulatory Visit: Payer: Self-pay | Admitting: *Deleted

## 2023-12-20 DIAGNOSIS — O09523 Supervision of elderly multigravida, third trimester: Secondary | ICD-10-CM

## 2023-12-20 DIAGNOSIS — O24013 Pre-existing diabetes mellitus, type 1, in pregnancy, third trimester: Secondary | ICD-10-CM

## 2023-12-21 ENCOUNTER — Ambulatory Visit: Payer: Self-pay | Admitting: Obstetrics & Gynecology

## 2023-12-21 ENCOUNTER — Other Ambulatory Visit: Payer: Self-pay

## 2023-12-21 VITALS — BP 123/83 | HR 101 | Wt 183.0 lb

## 2023-12-21 DIAGNOSIS — O0993 Supervision of high risk pregnancy, unspecified, third trimester: Secondary | ICD-10-CM

## 2023-12-21 DIAGNOSIS — Z3A29 29 weeks gestation of pregnancy: Secondary | ICD-10-CM

## 2023-12-21 DIAGNOSIS — O24419 Gestational diabetes mellitus in pregnancy, unspecified control: Secondary | ICD-10-CM

## 2023-12-21 DIAGNOSIS — O0990 Supervision of high risk pregnancy, unspecified, unspecified trimester: Secondary | ICD-10-CM

## 2023-12-21 DIAGNOSIS — O09523 Supervision of elderly multigravida, third trimester: Secondary | ICD-10-CM

## 2023-12-21 NOTE — Progress Notes (Signed)
   PRENATAL VISIT NOTE  Subjective:  [name] is a 38 y.o. [id number] at [redacted]w[redacted]d being seen today for ongoing prenatal care.  She is currently monitored for the following issues for this high-risk pregnancy and has Bulimia nervosa; Depression; Hypothyroidism; Palpitations; Gastro-esophageal reflux disease with esophagitis; Hematochezia; Supervision of high risk pregnancy, antepartum; Advanced maternal age in multigravida; Low-lying placenta; and Gestational diabetes mellitus (GDM) affecting pregnancy on their problem list.  Patient reports no complaints.  Contractions: Irritability. Vag. Bleeding: None.  Movement: Present. Denies leaking of fluid.   The following portions of the patient's history were reviewed and updated as appropriate: allergies, current medications, past family history, past medical history, past social history, past surgical history and problem list.   Objective:   Vitals:   12/21/23 1110  BP: 123/83  Pulse: (!) 101  Weight: 183 lb (83 kg)    Fetal Status: Fetal Heart Rate (bpm): 145   Movement: Present     General:  Alert, oriented and cooperative. Patient is in no acute distress.  Skin: Skin is warm and dry. No rash noted.   Cardiovascular: Normal heart rate noted  Respiratory: Normal respiratory effort, no problems with respiration noted  Abdomen: Soft, gravid, appropriate for gestational age.  Pain/Pressure: Present     Pelvic: Pelvic exam not performed at this visit.   Extremities: Normal range of motion.  Edema: None  Mental Status: Normal mood and affect. Normal behavior. Normal judgment and thought content.   Assessment and Plan:  Pregnancy: [id number] at [redacted]w[redacted]d 1. Supervision of high risk pregnancy, antepartum (Primary) - High-risk due to Advanced maternal age in multigravida; Low-lying placenta; and Gestational diabetes mellitus (GDM) - Patient is stable with ongoing monitoring - Continue routine prenatal care with additional fetal surveillance  as indicated 2. Multigravida of advanced maternal age in third trimester - No current complications  3. Gestational diabetes mellitus (GDM) affecting pregnancy - Fasting and postprandial glucose levels within normal ranges - No severe symptoms of hyperglycemia or hypoglycemia - Continue glucose monitoring and dietary   4. [redacted] weeks gestation of pregnancy - Fundal height appropriate for gestational age - Denies contractions, vaginal bleeding, or signs of preterm labor   Preterm labor symptoms and general obstetric precautions including but not limited to vaginal bleeding, contractions, leaking of fluid and fetal movement were reviewed in detail with the patient. Please refer to After Visit Summary for other counseling recommendations.   Return in about 2 weeks (around 01/04/2024).  Future Appointments  Date Time Provider Department Center  01/23/2024 11:15 AM [facility]SE [facility]  01/23/2024 11:30 AM [facility]4 [facility]    Kerrin Mo, Medical Student   Attestation of Attending Supervision of Medical Student: Evaluation and management procedures were performed by the medical student under my supervision and collaboration.  I have reviewed the student's note and chart, and I agree with the management and plan.  Scheryl Darter, MD, FACOG Attending Obstetrician & Gynecologist Faculty Practice, [facility]

## 2023-12-26 ENCOUNTER — Other Ambulatory Visit: Payer: Self-pay

## 2023-12-28 ENCOUNTER — Other Ambulatory Visit: Payer: Self-pay

## 2024-01-03 ENCOUNTER — Encounter: Payer: Self-pay | Admitting: Obstetrics and Gynecology

## 2024-01-10 ENCOUNTER — Encounter: Payer: Self-pay | Admitting: Obstetrics and Gynecology

## 2024-01-11 ENCOUNTER — Other Ambulatory Visit: Payer: Self-pay

## 2024-01-11 ENCOUNTER — Ambulatory Visit: Payer: Self-pay | Admitting: Obstetrics and Gynecology

## 2024-01-11 VITALS — BP 116/80 | HR 103 | Wt 187.4 lb

## 2024-01-11 DIAGNOSIS — O09523 Supervision of elderly multigravida, third trimester: Secondary | ICD-10-CM

## 2024-01-11 DIAGNOSIS — O24419 Gestational diabetes mellitus in pregnancy, unspecified control: Secondary | ICD-10-CM

## 2024-01-11 DIAGNOSIS — O99353 Diseases of the nervous system complicating pregnancy, third trimester: Secondary | ICD-10-CM

## 2024-01-11 DIAGNOSIS — O0993 Supervision of high risk pregnancy, unspecified, third trimester: Secondary | ICD-10-CM

## 2024-01-11 DIAGNOSIS — Z3A32 32 weeks gestation of pregnancy: Secondary | ICD-10-CM

## 2024-01-11 DIAGNOSIS — G5600 Carpal tunnel syndrome, unspecified upper limb: Secondary | ICD-10-CM | POA: Insufficient documentation

## 2024-01-11 DIAGNOSIS — E039 Hypothyroidism, unspecified: Secondary | ICD-10-CM

## 2024-01-11 DIAGNOSIS — O0990 Supervision of high risk pregnancy, unspecified, unspecified trimester: Secondary | ICD-10-CM

## 2024-01-11 MED ORDER — LEVOTHYROXINE SODIUM 25 MCG PO TABS
25.0000 ug | ORAL_TABLET | Freq: Every day | ORAL | 1 refills | Status: DC
  Filled 2024-01-11 – 2024-01-22 (×2): qty 90, 90d supply, fill #0
  Filled 2024-04-24: qty 90, 90d supply, fill #1

## 2024-01-11 NOTE — Progress Notes (Signed)
    PRENATAL VISIT NOTE  Subjective:  [name] [name] is a 38 y.o. [phone] at [redacted]w[redacted]d being seen today for ongoing prenatal care.  She is currently monitored for the following issues for this high-risk pregnancy and has Bulimia nervosa; Depression; Hypothyroidism; Palpitations; Gastro-esophageal reflux disease with esophagitis; Hematochezia; Supervision of high risk pregnancy, antepartum; Advanced maternal age in multigravida; and Gestational diabetes mellitus (GDM) affecting pregnancy on their problem list.  Patient doing well with no acute concerns today. She reports carpal tunnel symptoms.  Contractions: Irritability. Vag. Bleeding: None.  Movement: Present. Denies leaking of fluid.   The following portions of the patient's history were reviewed and updated as appropriate: allergies, current medications, past family history, past medical history, past social history, past surgical history and problem list. Problem list updated.  Objective:   Vitals:   01/11/24 1024  BP: 116/80  Pulse: (!) 103  Weight: 187 lb 7 oz (85 kg)    Fetal Status: Fetal Heart Rate (bpm): 143 Fundal Height: 32 cm Movement: Present     General:  Alert, oriented and cooperative. Patient is in no acute distress.  Skin: Skin is warm and dry. No rash noted.   Cardiovascular: Normal heart rate noted  Respiratory: Normal respiratory effort, no problems with respiration noted  Abdomen: Soft, gravid, appropriate for gestational age.  Pain/Pressure: Present     Pelvic: Cervical exam deferred        Extremities: Normal range of motion.  Edema: Trace  Mental Status:  Normal mood and affect. Normal behavior. Normal judgment and thought content.  Positive tinnel's sign right hand Slight decrease in grip strength right hand Assessment and Plan:  Pregnancy: [id number] at [redacted]w[redacted]d  1. [redacted] weeks gestation of pregnancy (Primary)   2. Gestational diabetes mellitus (GDM) affecting pregnancy FBS:83-98, most in range PPBS:  82-126  3. Hypothyroidism, unspecified type Refill of synthroid sent  4. Supervision of high risk pregnancy, antepartum Continue routine prenatal care  5. Multigravida of advanced maternal age in third trimester   Preterm labor symptoms and general obstetric precautions including but not limited to vaginal bleeding, contractions, leaking of fluid and fetal movement were reviewed in detail with the patient.  Please refer to After Visit Summary for other counseling recommendations.   Return in about 2 weeks (around 01/25/2024) for [facility], in person.   Mariel Aloe, MD Faculty Attending Center for [facility]

## 2024-01-15 ENCOUNTER — Other Ambulatory Visit: Payer: Self-pay

## 2024-01-16 ENCOUNTER — Other Ambulatory Visit: Payer: Self-pay

## 2024-01-23 ENCOUNTER — Ambulatory Visit (HOSPITAL_BASED_OUTPATIENT_CLINIC_OR_DEPARTMENT_OTHER): Payer: Self-pay | Admitting: Obstetrics and Gynecology

## 2024-01-23 ENCOUNTER — Ambulatory Visit: Payer: Self-pay

## 2024-01-23 ENCOUNTER — Other Ambulatory Visit: Payer: Self-pay

## 2024-01-23 ENCOUNTER — Ambulatory Visit: Payer: Self-pay | Attending: Obstetrics

## 2024-01-23 VITALS — BP 121/76 | HR 108

## 2024-01-23 DIAGNOSIS — E039 Hypothyroidism, unspecified: Secondary | ICD-10-CM

## 2024-01-23 DIAGNOSIS — O24410 Gestational diabetes mellitus in pregnancy, diet controlled: Secondary | ICD-10-CM

## 2024-01-23 DIAGNOSIS — O09523 Supervision of elderly multigravida, third trimester: Secondary | ICD-10-CM

## 2024-01-23 DIAGNOSIS — O4443 Low lying placenta NOS or without hemorrhage, third trimester: Secondary | ICD-10-CM | POA: Insufficient documentation

## 2024-01-23 DIAGNOSIS — O0990 Supervision of high risk pregnancy, unspecified, unspecified trimester: Secondary | ICD-10-CM | POA: Insufficient documentation

## 2024-01-23 DIAGNOSIS — O99283 Endocrine, nutritional and metabolic diseases complicating pregnancy, third trimester: Secondary | ICD-10-CM

## 2024-01-23 DIAGNOSIS — O24013 Pre-existing diabetes mellitus, type 1, in pregnancy, third trimester: Secondary | ICD-10-CM | POA: Insufficient documentation

## 2024-01-23 DIAGNOSIS — Z3A34 34 weeks gestation of pregnancy: Secondary | ICD-10-CM | POA: Insufficient documentation

## 2024-01-28 NOTE — Progress Notes (Unsigned)
   PRENATAL VISIT NOTE  Subjective:  [name] [name] is a 38 y.o. [phone] at [redacted]w[redacted]d being seen today for ongoing prenatal care.  She is currently monitored for the following issues for this high-risk pregnancy and has Bulimia nervosa; Depression; Hypothyroidism; Palpitations; Gastro-esophageal reflux disease with esophagitis; Hematochezia; Supervision of high risk pregnancy, antepartum; Advanced maternal age in multigravida; Gestational diabetes mellitus (GDM) affecting pregnancy; and Pregnancy related carpal tunnel syndrome in third trimester on their problem list.  Patient reports no complaints.  Contractions: Irritability. Vag. Bleeding: None.  Movement: Present. Denies leaking of fluid.   The following portions of the patient's history were reviewed and updated as appropriate: allergies, current medications, past family history, past medical history, past social history, past surgical history and problem list.   Objective:   Vitals:   01/29/24 1307  BP: 117/75  Pulse: (!) 102  Weight: 192 lb (87.1 kg)    Fetal Status: Fetal Heart Rate (bpm): 159   Movement: Present     General:  Alert, oriented and cooperative. Patient is in no acute distress.  Skin: Skin is warm and dry. No rash noted.   Cardiovascular: Normal heart rate noted  Respiratory: Normal respiratory effort, no problems with respiration noted  Abdomen: Soft, gravid, appropriate for gestational age.  Pain/Pressure: Present     Pelvic: Cervical exam deferred        Extremities: Normal range of motion.  Edema: Trace  Mental Status: Normal mood and affect. Normal behavior. Normal judgment and thought content.   Assessment and Plan:  Pregnancy: [id number] at [redacted]w[redacted]d  1. Supervision of high risk pregnancy in third trimester (Primary) Patient is doing well, feeling regular fetal movement BP, FHR, FH appropriate  2. [redacted] weeks gestation of pregnancy Anticipatory guidance about next visit/weeks of pregnancy given  3.  Gestational diabetes mellitus (GDM) affecting pregnancy, diet controlled  FBS: 70-98 with >80% WNL PPBS: 81-120 with <80% WNL  4. Hypothyroidism, unspecified type Compliant on Synthyroid 25 mcg daily Asymptomatic  Preterm labor symptoms and general obstetric precautions including but not limited to vaginal bleeding, contractions, leaking of fluid and fetal movement were reviewed in detail with the patient.  Please refer to After Visit Summary for other counseling recommendations.   Return in about 1 week (around 02/05/2024) for LOB+GBS.  Future Appointments  Date Time Provider Department Center  02/05/2024  9:35 AM Kendra Gelb E, PA-C [facility]  02/13/2024  1:15 PM [name] [facility]  02/23/2024 10:55 AM Kendra Saffo E, PA-C [facility]     Kendra Harmsen [name] Carin Shipp, PA-C

## 2024-01-29 ENCOUNTER — Other Ambulatory Visit: Payer: Self-pay

## 2024-01-29 ENCOUNTER — Ambulatory Visit: Payer: Self-pay | Admitting: Physician Assistant

## 2024-01-29 VITALS — BP 117/75 | HR 102 | Wt 192.0 lb

## 2024-01-29 DIAGNOSIS — Z3A35 35 weeks gestation of pregnancy: Secondary | ICD-10-CM

## 2024-01-29 DIAGNOSIS — O0993 Supervision of high risk pregnancy, unspecified, third trimester: Secondary | ICD-10-CM

## 2024-01-29 DIAGNOSIS — E039 Hypothyroidism, unspecified: Secondary | ICD-10-CM

## 2024-01-29 DIAGNOSIS — O24419 Gestational diabetes mellitus in pregnancy, unspecified control: Secondary | ICD-10-CM

## 2024-01-29 NOTE — Patient Instructions (Addendum)
 You may use up to 3000 mg of tylenol  within 24 hours for pain.   5-1-1 rule: contractions happening 5 minutes apart, lasting for 1 minute each, happening for at least 1 hour.

## 2024-02-01 ENCOUNTER — Other Ambulatory Visit: Payer: Self-pay

## 2024-02-04 NOTE — Progress Notes (Unsigned)
   PRENATAL VISIT NOTE  Subjective:  [name] is a 38 y.o. [id number] at [redacted]w[redacted]d being seen today for ongoing prenatal care.  She is currently monitored for the following issues for this {Blank single:19197::"high-risk","low-risk"} pregnancy and has Bulimia nervosa; Depression; Hypothyroidism; Palpitations; Gastro-esophageal reflux disease with esophagitis; Hematochezia; Supervision of high risk pregnancy, antepartum; Advanced maternal age in multigravida; Gestational diabetes mellitus (GDM) affecting pregnancy; and Pregnancy related carpal tunnel syndrome in third trimester on their problem list.  Patient reports {sx:[id number]}.   .  .   . Denies leaking of fluid.   The following portions of the patient's history were reviewed and updated as appropriate: allergies, current medications, past family history, past medical history, past social history, past surgical history and problem list.   Objective:  There were no vitals filed for this visit.  Fetal Status:           General:  Alert, oriented and cooperative. Patient is in no acute distress.  Skin: Skin is warm and dry. No rash noted.   Cardiovascular: Normal heart rate noted  Respiratory: Normal respiratory effort, no problems with respiration noted  Abdomen: Soft, gravid, appropriate for gestational age.        Pelvic: {Blank single:19197::"Cervical exam performed in the presence of a chaperone","Cervical exam deferred"}        Extremities: Normal range of motion.     Mental Status: Normal mood and affect. Normal behavior. Normal judgment and thought content.   Assessment and Plan:  Pregnancy: [id number] at [redacted]w[redacted]d  1. Supervision of high risk pregnancy, antepartum (Primary) Patient doing well, feeling regular fetal movement BP, FHR, FH appropriate  2. [redacted] weeks gestation of pregnancy Anticipatory guidance about next visits/weeks of pregnancy given.   3. Gestational diabetes mellitus (GDM) affecting pregnancy FBS:  PPBS:   01/23/24 US  WNL  4. Hypothyroidism, unspecified type Synthroid  25 mcg daily  {Blank single:19197::"Term","Preterm"} labor symptoms and general obstetric precautions including but not limited to vaginal bleeding, contractions, leaking of fluid and fetal movement were reviewed in detail with the patient.  Please refer to After Visit Summary for other counseling recommendations.   No follow-ups on file.  Future Appointments  Date Time Provider Department Center  02/05/2024  9:35 AM Charissa Knowles E, PA-C [facility]  02/13/2024  1:15 PM Curlie Doughty [facility]  02/23/2024 10:55 AM Tamla Winkels E, PA-C [facility]    Luzmaria Devaux E Shavontae Gibeault, PA-C

## 2024-02-05 ENCOUNTER — Other Ambulatory Visit (HOSPITAL_COMMUNITY)
Admission: RE | Admit: 2024-02-05 | Discharge: 2024-02-05 | Disposition: A | Discharge status: Home / Self Care | Payer: Self-pay | Source: Ambulatory Visit | Attending: Physician Assistant | Admitting: Physician Assistant

## 2024-02-05 ENCOUNTER — Ambulatory Visit: Payer: Self-pay | Admitting: Physician Assistant

## 2024-02-05 ENCOUNTER — Other Ambulatory Visit: Payer: Self-pay

## 2024-02-05 VITALS — BP 124/81 | HR 83 | Wt 191.4 lb

## 2024-02-05 DIAGNOSIS — O0993 Supervision of high risk pregnancy, unspecified, third trimester: Secondary | ICD-10-CM

## 2024-02-05 DIAGNOSIS — Z3A36 36 weeks gestation of pregnancy: Secondary | ICD-10-CM

## 2024-02-05 DIAGNOSIS — E039 Hypothyroidism, unspecified: Secondary | ICD-10-CM

## 2024-02-05 DIAGNOSIS — O26893 Other specified pregnancy related conditions, third trimester: Secondary | ICD-10-CM

## 2024-02-05 DIAGNOSIS — O24419 Gestational diabetes mellitus in pregnancy, unspecified control: Secondary | ICD-10-CM

## 2024-02-05 DIAGNOSIS — O0990 Supervision of high risk pregnancy, unspecified, unspecified trimester: Secondary | ICD-10-CM

## 2024-02-05 DIAGNOSIS — R300 Dysuria: Secondary | ICD-10-CM

## 2024-02-05 DIAGNOSIS — O09523 Supervision of elderly multigravida, third trimester: Secondary | ICD-10-CM | POA: Insufficient documentation

## 2024-02-06 ENCOUNTER — Encounter: Payer: Self-pay | Admitting: Physician Assistant

## 2024-02-06 LAB — POCT URINALYSIS DIP (DEVICE)
Bilirubin Urine: NEGATIVE
Glucose, UA: NEGATIVE mg/dL
Hgb urine dipstick: NEGATIVE
Ketones, ur: 15 mg/dL — AB
Leukocytes,Ua: NEGATIVE
Nitrite: NEGATIVE
Protein, ur: NEGATIVE mg/dL
Specific Gravity, Urine: 1.025 (ref 1.005–1.030)
Urobilinogen, UA: 0.2 mg/dL (ref 0.0–1.0)
pH: 6 (ref 5.0–8.0)

## 2024-02-06 LAB — CERVICOVAGINAL ANCILLARY ONLY
Chlamydia: NEGATIVE
Comment: NEGATIVE
Comment: NEGATIVE
Comment: NORMAL
Neisseria Gonorrhea: NEGATIVE
Trichomonas: NEGATIVE

## 2024-02-07 ENCOUNTER — Encounter: Payer: Self-pay | Admitting: Physician Assistant

## 2024-02-07 LAB — URINE CULTURE, OB REFLEX

## 2024-02-07 LAB — CULTURE, OB URINE

## 2024-02-09 ENCOUNTER — Encounter: Payer: Self-pay | Admitting: Physician Assistant

## 2024-02-09 LAB — CULTURE, BETA STREP (GROUP B ONLY): Strep Gp B Culture: NEGATIVE

## 2024-02-12 NOTE — Progress Notes (Unsigned)
   PRENATAL VISIT NOTE  Subjective:  [name] is a 38 y.o. [phone] at [redacted]w[redacted]d being seen today for ongoing prenatal care.  She is currently monitored for the following issues for this low-risk pregnancy and has Bulimia nervosa; Depression; Hypothyroidism; Gastro-esophageal reflux disease with esophagitis; Supervision of high risk pregnancy, antepartum; Advanced maternal age in multigravida; Gestational diabetes mellitus (GDM) affecting pregnancy; and Pregnancy related carpal tunnel syndrome in third trimester on their problem list.  Patient reports some spotting with wiping.  Contractions: Not present. Vag. Bleeding: Scant.  Movement: Present. Denies leaking of fluid.   The following portions of the patient's history were reviewed and updated as appropriate: allergies, current medications, past family history, past medical history, past social history, past surgical history and problem list.   Objective:   Vitals:   02/13/24 1330  BP: 120/80  Pulse: 99  Weight: 192 lb (87.1 kg)     Fetal Status: Fetal Heart Rate (bpm): 143 Fundal Height: 37 cm Movement: Present      General: Alert, oriented and cooperative. Patient is in no acute distress.  Skin: Skin is warm and dry. No rash noted.   Cardiovascular: Normal heart rate noted  Respiratory: Normal respiratory effort, no problems with respiration noted  Abdomen: Soft, gravid, appropriate for gestational age.  Pain/Pressure: Present (intermittent)     Pelvic: Cervical exam deferred        Extremities: Normal range of motion.  Edema: Trace  Mental Status: Normal mood and affect. Normal behavior. Normal judgment and thought content.   Assessment and Plan:  Pregnancy: [id number] at [redacted]w[redacted]d 1. Supervision of high risk pregnancy, antepartum (Primary) - Doing well, feeling regular and vigorous fetal movement  2. [redacted] weeks gestation of pregnancy - FH appropriate  3. Gestational diabetes mellitus (GDM) affecting pregnancy - Verbal report of  CBGs WNL.   4. Hypothyroidism, unspecified type - Consider repeat of TSH/thyroid  panel at next visit.   5. Language barrier Ivin Marrow, in person Spanish interpreter used for entire episode of care.   6. Vaginal bleeding - Seen in MAU this morning. Empirically treated for UTI.  - Collected wet prep to rule out vaginitis as cause.   Term labor symptoms and general obstetric precautions including but not limited to vaginal bleeding, contractions, leaking of fluid and fetal movement were reviewed in detail with the patient. Please refer to After Visit Summary for other counseling recommendations.   Return in about 1 week (around 02/20/2024) for LOB.  Future Appointments  Date Time Provider Department Center  02/23/2024 10:55 AM Davis, Devon E, PA-C [facility]    Salomon Cree, CNM

## 2024-02-13 ENCOUNTER — Inpatient Hospital Stay (HOSPITAL_COMMUNITY)
Admission: AD | Admit: 2024-02-13 | Discharge: 2024-02-13 | Disposition: A | Discharge status: Home / Self Care | Payer: Self-pay | Attending: Obstetrics and Gynecology | Admitting: Obstetrics and Gynecology

## 2024-02-13 ENCOUNTER — Ambulatory Visit (INDEPENDENT_AMBULATORY_CARE_PROVIDER_SITE_OTHER): Payer: Self-pay | Admitting: Certified Nurse Midwife

## 2024-02-13 ENCOUNTER — Other Ambulatory Visit (HOSPITAL_COMMUNITY)
Admission: RE | Admit: 2024-02-13 | Discharge: 2024-02-13 | Disposition: A | Discharge status: Home / Self Care | Payer: Self-pay | Source: Ambulatory Visit | Attending: Certified Nurse Midwife | Admitting: Certified Nurse Midwife

## 2024-02-13 ENCOUNTER — Other Ambulatory Visit: Payer: Self-pay

## 2024-02-13 ENCOUNTER — Encounter (HOSPITAL_COMMUNITY): Payer: Self-pay | Admitting: Obstetrics and Gynecology

## 2024-02-13 VITALS — BP 120/80 | HR 99 | Wt 192.0 lb

## 2024-02-13 DIAGNOSIS — R300 Dysuria: Secondary | ICD-10-CM | POA: Insufficient documentation

## 2024-02-13 DIAGNOSIS — O368130 Decreased fetal movements, third trimester, not applicable or unspecified: Secondary | ICD-10-CM | POA: Insufficient documentation

## 2024-02-13 DIAGNOSIS — Z3689 Encounter for other specified antenatal screening: Secondary | ICD-10-CM

## 2024-02-13 DIAGNOSIS — Z758 Other problems related to medical facilities and other health care: Secondary | ICD-10-CM

## 2024-02-13 DIAGNOSIS — Z3A37 37 weeks gestation of pregnancy: Secondary | ICD-10-CM

## 2024-02-13 DIAGNOSIS — N939 Abnormal uterine and vaginal bleeding, unspecified: Secondary | ICD-10-CM

## 2024-02-13 DIAGNOSIS — O479 False labor, unspecified: Secondary | ICD-10-CM

## 2024-02-13 DIAGNOSIS — O26893 Other specified pregnancy related conditions, third trimester: Secondary | ICD-10-CM | POA: Insufficient documentation

## 2024-02-13 DIAGNOSIS — O0993 Supervision of high risk pregnancy, unspecified, third trimester: Secondary | ICD-10-CM

## 2024-02-13 DIAGNOSIS — O0990 Supervision of high risk pregnancy, unspecified, unspecified trimester: Secondary | ICD-10-CM | POA: Insufficient documentation

## 2024-02-13 DIAGNOSIS — O2343 Unspecified infection of urinary tract in pregnancy, third trimester: Secondary | ICD-10-CM

## 2024-02-13 DIAGNOSIS — R519 Headache, unspecified: Secondary | ICD-10-CM

## 2024-02-13 DIAGNOSIS — M549 Dorsalgia, unspecified: Secondary | ICD-10-CM

## 2024-02-13 DIAGNOSIS — E039 Hypothyroidism, unspecified: Secondary | ICD-10-CM

## 2024-02-13 DIAGNOSIS — Z603 Acculturation difficulty: Secondary | ICD-10-CM

## 2024-02-13 DIAGNOSIS — B9689 Other specified bacterial agents as the cause of diseases classified elsewhere: Secondary | ICD-10-CM | POA: Insufficient documentation

## 2024-02-13 DIAGNOSIS — O471 False labor at or after 37 completed weeks of gestation: Secondary | ICD-10-CM | POA: Insufficient documentation

## 2024-02-13 DIAGNOSIS — O24419 Gestational diabetes mellitus in pregnancy, unspecified control: Secondary | ICD-10-CM

## 2024-02-13 LAB — URINALYSIS, ROUTINE W REFLEX MICROSCOPIC
Bilirubin Urine: NEGATIVE
Glucose, UA: NEGATIVE mg/dL
Ketones, ur: 5 mg/dL — AB
Leukocytes,Ua: NEGATIVE
Nitrite: NEGATIVE
Protein, ur: NEGATIVE mg/dL
Specific Gravity, Urine: 1.009 (ref 1.005–1.030)
pH: 6 (ref 5.0–8.0)

## 2024-02-13 LAB — CBC
HCT: 41.2 % (ref 36.0–46.0)
Hemoglobin: 13.7 g/dL (ref 12.0–15.0)
MCH: 29.9 pg (ref 26.0–34.0)
MCHC: 33.3 g/dL (ref 30.0–36.0)
MCV: 90 fL (ref 80.0–100.0)
Platelets: 294 10*3/uL (ref 150–400)
RBC: 4.58 MIL/uL (ref 3.87–5.11)
RDW: 14.1 % (ref 11.5–15.5)
WBC: 11 10*3/uL — ABNORMAL HIGH (ref 4.0–10.5)
nRBC: 0 % (ref 0.0–0.2)

## 2024-02-13 LAB — COMPREHENSIVE METABOLIC PANEL WITH GFR
ALT: 23 U/L (ref 0–44)
AST: 25 U/L (ref 15–41)
Albumin: 2.7 g/dL — ABNORMAL LOW (ref 3.5–5.0)
Alkaline Phosphatase: 138 U/L — ABNORMAL HIGH (ref 38–126)
Anion gap: 9 (ref 5–15)
BUN: 7 mg/dL (ref 6–20)
CO2: 17 mmol/L — ABNORMAL LOW (ref 22–32)
Calcium: 8.9 mg/dL (ref 8.9–10.3)
Chloride: 108 mmol/L (ref 98–111)
Creatinine, Ser: 0.51 mg/dL (ref 0.44–1.00)
GFR, Estimated: >60 mL/min (ref >60)
Glucose, Bld: 111 mg/dL — ABNORMAL HIGH (ref 70–99)
Potassium: 3.5 mmol/L (ref 3.5–5.1)
Sodium: 134 mmol/L — ABNORMAL LOW (ref 135–145)
Total Bilirubin: 0.3 mg/dL (ref 0.0–1.2)
Total Protein: 6.1 g/dL — ABNORMAL LOW (ref 6.5–8.1)

## 2024-02-13 LAB — PROTEIN / CREATININE RATIO, URINE
Creatinine, Urine: 39 mg/dL
Protein Creatinine Ratio: 0.23 mg/mg{creat} — ABNORMAL HIGH (ref 0.00–0.15)
Total Protein, Urine: 9 mg/dL

## 2024-02-13 MED ORDER — ACETAMINOPHEN-CAFFEINE 500-65 MG PO TABS: 2.0000 | ORAL_TABLET | Freq: Once | ORAL | Status: DC

## 2024-02-13 MED ORDER — NITROFURANTOIN MONOHYD MACRO 100 MG PO CAPS
100.0000 mg | ORAL_CAPSULE | Freq: Two times a day (BID) | ORAL | 0 refills | Status: DC
  Filled 2024-02-13: qty 10, 5d supply, fill #0

## 2024-02-13 NOTE — MAU Provider Note (Addendum)
 History     CSN: [id number]  Arrival date and time: 02/13/24 0715   Event Date/Time   First Provider Initiated Contact with Patient 02/13/24 [phone]      Chief Complaint  Patient presents with   Vaginal Bleeding   Headache   Decreased Fetal Movement   Abdominal Pain   Back Pain   Vaginal Bleeding Associated symptoms include abdominal pain, back pain and headaches. Pertinent negatives include no chills, diarrhea, dysuria, fever, flank pain, nausea, rash, sore throat or vomiting.  Headache  Associated symptoms include abdominal pain and back pain. Pertinent negatives include no coughing, dizziness, eye pain, fever, nausea, sore throat or vomiting.  Abdominal Pain Associated symptoms include headaches. Pertinent negatives include no diarrhea, dysuria, fever, nausea or vomiting.  Back Pain Associated symptoms include abdominal pain and headaches. Pertinent negatives include no chest pain, dysuria or fever.    38 y.o. [id number] [redacted]w[redacted]d here with complaints of   HA- now resolved but was present. Did not take any medication  Lower abdominal pain with bleeding: reports last night she go up to go to the bathroom and noted lower abdominal cramping located over her uterus. She noted blood in the toilet after urination. Denies polyuria. Does report pain with urination 1-2 days ago. Denies fever, chills. She is reports a cramping sensation currently. No continued bleeding.  She reports back pain that is persistent and associated with the abdominal pain that is coming and going.   Reports regular movement of baby but it is less strong movements.   +FM, denies LOF, VB, contractions, vaginal discharge.   OB History     Gravida  7   Para  3   Term  3   Preterm  0   AB  3   Living  3      SAB  3   IAB  0   Ectopic  0   Multiple  0   Live Births  3           Past Medical History:  Diagnosis Date   Bulimia nervosa    no current problems  (07/2022)   Eczema    GERD  (gastroesophageal reflux disease)    Hematochezia 08/19/2021   Hyperthyroidism     Past Surgical History:  Procedure Laterality Date   COLONOSCOPY WITH PROPOFOL  N/A 03/18/2016   Procedure: COLONOSCOPY WITH PROPOFOL ;  Surgeon: Ozell Blunt, MD;  Location: [facility] ENDOSCOPY;  Service: Endoscopy;  Laterality: N/A;   ESOPHAGOGASTRODUODENOSCOPY (EGD) WITH PROPOFOL  N/A 09/22/2017   Procedure: ESOPHAGOGASTRODUODENOSCOPY (EGD) WITH PROPOFOL ;  Surgeon: Ozell Blunt, MD;  Location: [facility] ENDOSCOPY;  Service: Endoscopy;  Laterality: N/A;   NO PAST SURGERIES      Family History  Problem Relation Age of Onset   Healthy Mother    Diabetes Father    Hearing loss Neg Hx    Cancer Neg Hx    Heart failure Neg Hx    Heart disease Neg Hx    Hypertension Neg Hx     Social History   Tobacco Use   Smoking status: Never   Smokeless tobacco: Never  Vaping Use   Vaping status: Never Used  Substance Use Topics   Alcohol use: Never   Drug use: Never    Allergies: No Known Allergies  Medications Prior to Admission  Medication Sig Dispense Refill Last Dose/Taking   aspirin  EC 81 MG tablet Take 81 mg by mouth daily. Swallow whole.   02/12/2024   levothyroxine  (SYNTHROID ) 25 MCG tablet Take  1 tablet (25 mcg total) by mouth daily. 90 tablet 1 02/13/2024 Morning   Prenatal Vit-Fe Fumarate-FA (PRENATAL VITAMIN PO) Take by mouth.   02/12/2024   Blood Glucose Monitoring Suppl (TRUE METRIX METER) w/Device KIT Please check blood glucose 4 times a day. Once in the morning before eating anything (fasting), and 2 hours after breakfast, lunch, and dinner. 1 kit 0    glucose blood test strip Use as instructed 100 each 12    TRUEplus Lancets 28G MISC Use as instructed 100 each 12     Review of Systems  Constitutional:  Negative for chills and fever.  HENT:  Negative for congestion and sore throat.   Eyes:  Negative for pain and visual disturbance.  Respiratory:  Negative for cough, chest tightness and shortness of breath.    Cardiovascular:  Negative for chest pain.  Gastrointestinal:  Positive for abdominal pain. Negative for diarrhea, nausea and vomiting.  Endocrine: Negative for cold intolerance and heat intolerance.  Genitourinary:  Positive for vaginal bleeding. Negative for dysuria and flank pain.  Musculoskeletal:  Positive for back pain.  Skin:  Negative for rash.  Allergic/Immunologic: Negative for food allergies.  Neurological:  Positive for headaches. Negative for dizziness and light-headedness.  Psychiatric/Behavioral:  Negative for agitation.    Physical Exam   Blood pressure 139/83, pulse 96, temperature 98.7 F (37.1 C), temperature source Oral, resp. rate 16, height 5\' 3"  (1.6 m), weight 87.8 kg, last menstrual period 05/28/2023, SpO2 99%, unknown if currently breastfeeding.  Patient Vitals for the past 24 hrs:  BP Temp Temp src Pulse Resp SpO2 Height Weight  02/13/24 1137 139/83 -- -- 96 -- -- -- --  02/13/24 0742 128/89 -- -- (!) 109 -- -- -- --  02/13/24 0737 131/88 98.7 F (37.1 C) Oral (!) 108 16 99 % 5\' 3"  (1.6 m) 87.8 kg    Physical Exam Vitals and nursing note reviewed.  Constitutional:      General: She is not in acute distress.    Appearance: She is well-developed.     Comments: Pregnant female  HENT:     Head: Normocephalic and atraumatic.  Eyes:     General: No scleral icterus.    Conjunctiva/sclera: Conjunctivae normal.  Cardiovascular:     Rate and Rhythm: Normal rate.  Pulmonary:     Effort: Pulmonary effort is normal.  Chest:     Chest wall: No tenderness.  Abdominal:     Palpations: Abdomen is soft.     Tenderness: There is no abdominal tenderness. There is no guarding or rebound.     Comments: Gravid  Genitourinary:    Vagina: Normal.     Comments: No vaginal bleeding present, none at perineum and none on glove after cervical check Musculoskeletal:        General: Normal range of motion.     Cervical back: Normal range of motion and neck supple.   Skin:    General: Skin is warm and dry.     Findings: No rash.  Neurological:     Mental Status: She is alert and oriented to person, place, and time.    Dilation: Closed Effacement (%): Thick Exam by:: Dr. Daisey Dryer   MAU Course  Procedures  NST: 140/moderate/+accels, no decels Toco: irritable  MDM high  Cervical exam helped r/o labor. UA showed blood in urine. UTI suspected given sx and blood in UA  Results for orders placed or performed during the hospital encounter of 02/13/24 (from the past 24  hours)  Urinalysis, Routine w reflex microscopic -Urine, Clean Catch     Status: Abnormal   Collection Time: 02/13/24  7:48 AM  Result Value Ref Range   Color, Urine YELLOW YELLOW   APPearance CLEAR CLEAR   Specific Gravity, Urine 1.009 1.005 - 1.030   pH 6.0 5.0 - 8.0   Glucose, UA NEGATIVE NEGATIVE mg/dL   Hgb urine dipstick MODERATE (A) NEGATIVE   Bilirubin Urine NEGATIVE NEGATIVE   Ketones, ur 5 (A) NEGATIVE mg/dL   Protein, ur NEGATIVE NEGATIVE mg/dL   Nitrite NEGATIVE NEGATIVE   Leukocytes,Ua NEGATIVE NEGATIVE   RBC / HPF 0-5 0 - 5 RBC/hpf   WBC, UA 0-5 0 - 5 WBC/hpf   Bacteria, UA FEW (A) NONE SEEN   Squamous Epithelial / HPF 0-5 0 - 5 /HPF   Mucus PRESENT   Protein / creatinine ratio, urine     Status: Abnormal   Collection Time: 02/13/24  8:16 AM  Result Value Ref Range   Creatinine, Urine 39 mg/dL   Total Protein, Urine 9 mg/dL   Protein Creatinine Ratio 0.23 (H) 0.00 - 0.15 mg/mg[Cre]  CBC     Status: Abnormal   Collection Time: 02/13/24  8:47 AM  Result Value Ref Range   WBC 11.0 (H) 4.0 - 10.5 K/uL   RBC 4.58 3.87 - 5.11 MIL/uL   Hemoglobin 13.7 12.0 - 15.0 g/dL   HCT 14.7 82.9 - 56.2 %   MCV 90.0 80.0 - 100.0 fL   MCH 29.9 26.0 - 34.0 pg   MCHC 33.3 30.0 - 36.0 g/dL   RDW 13.0 86.5 - 78.4 %   Platelets 294 150 - 400 K/uL   nRBC 0.0 0.0 - 0.2 %  Comprehensive metabolic panel     Status: Abnormal   Collection Time: 02/13/24  8:47 AM  Result Value Ref  Range   Sodium 134 (L) 135 - 145 mmol/L   Potassium 3.5 3.5 - 5.1 mmol/L   Chloride 108 98 - 111 mmol/L   CO2 17 (L) 22 - 32 mmol/L   Glucose, Bld 111 (H) 70 - 99 mg/dL   BUN 7 6 - 20 mg/dL   Creatinine, Ser 6.96 0.44 - 1.00 mg/dL   Calcium 8.9 8.9 - 29.5 mg/dL   Total Protein 6.1 (L) 6.5 - 8.1 g/dL   Albumin 2.7 (L) 3.5 - 5.0 g/dL   AST 25 15 - 41 U/L   ALT 23 0 - 44 U/L   Alkaline Phosphatase 138 (H) 38 - 126 U/L   Total Bilirubin 0.3 0.0 - 1.2 mg/dL   GFR, Estimated >28 >41 mL/min   Anion gap 9 5 - 15   10:50 AM called main lab to check on UPC which has not yet resulted 12:07 PM reviewed results with patient. Sent in macrobid  to her pharmacy of choice  Assessment and Plan   Discharged home stable condition Treated for UTI empirically with macrobid  given hgb in urine and dysuria with uterine irritability Urine culture is pending, await results  Future Appointments  Date Time Provider Department Center  02/13/2024  1:15 PM Curlie Doughty [facility]  02/23/2024 10:55 AM Davis, Devon E, PA-C [facility]     Kendricks Reap Niles Yarimar Lavis 02/13/2024, 12:14 PM

## 2024-02-13 NOTE — MAU Note (Signed)
 RN called lab to inquire about status of P/C ratio for a second time. Terrence answered and reported that it is in process.

## 2024-02-13 NOTE — MAU Note (Signed)
 RN called lab to inquire about status of P/C ratio add-on.

## 2024-02-13 NOTE — MAU Note (Signed)
 MAU Triage Note:  .[name] is a 38 y.o. at [redacted]w[redacted]d here in MAU reporting: she woke up with a headache around 0500 this morning - has not taken any medication for the pain. When she went to the bathroom, she noticed small to moderate amount of bleeding. Denies pad use currently. She reports DFM since this morning. Having constant lower abdominal and back pain that started this AM also. Denies LOF.  Patient complaint: Vaginal bleeding stomach pain HA  Pain Score: 8  Pain Location: Head Pain Score: 4 Pain Location: Back   Onset of complaint: today LMP: Patient's last menstrual period was 05/28/2023 (exact date).  Vitals:   02/13/24 0737 02/13/24 0742  BP: 131/88 128/89  Pulse: (!) 108 (!) 109  Resp: 16   Temp: 98.7 F (37.1 C)   SpO2: 99%     FHT:  Fetal Heart Rate Mode: External Baseline Rate (A): 140 bpm Lab orders placed from triage: UA

## 2024-02-13 NOTE — Discharge Instructions (Signed)
 You were seen at the maternity assessment unit for contractions and cramping with back pain.  While you were here we checked your cervix which showed your cervix is not starting to open.  We also ran labs to make sure that your headache was not related to elevated blood pressures and these were all normal.  Please continue to follow-up with your outpatient OB provider.  Come to the MAU (maternity admission unit) for 1) Strong contractions every 2-3 minutes for at least 1 hour that do not go away when you drink water or take a warm shower. These contractions will be so strong all you can do is breath through them 2) Vaginal bleeding- anything more than spotting 3) Loss of fluid like you broke your water 4) Decreased movement of your baby

## 2024-02-13 NOTE — Addendum Note (Signed)
 Addended by: Pati Bonine on: 02/13/2024 02:53 PM   Modules accepted: Orders

## 2024-02-14 LAB — CULTURE, OB URINE: Culture: NO GROWTH

## 2024-02-15 LAB — CERVICOVAGINAL ANCILLARY ONLY
Bacterial Vaginitis (gardnerella): NEGATIVE
Candida Glabrata: NEGATIVE
Candida Vaginitis: NEGATIVE
Chlamydia: NEGATIVE
Comment: NEGATIVE
Comment: NEGATIVE
Comment: NEGATIVE
Comment: NEGATIVE
Comment: NEGATIVE
Comment: NORMAL
Neisseria Gonorrhea: NEGATIVE
Trichomonas: NEGATIVE

## 2024-02-22 NOTE — Progress Notes (Unsigned)
   PRENATAL VISIT NOTE  Subjective:  [name] is a 38 y.o. [phone] at [redacted]w[redacted]d being seen today for ongoing prenatal care.  She is currently monitored for the following issues for this {Blank single:19197::"high-risk","low-risk"} pregnancy and has Bulimia nervosa; Depression; Hypothyroidism; Gastro-esophageal reflux disease with esophagitis; Supervision of high risk pregnancy, antepartum; Advanced maternal age in multigravida; Gestational diabetes mellitus (GDM) affecting pregnancy; and Pregnancy related carpal tunnel syndrome in third trimester on their problem list.  Patient reports {sx:14538}.  Contractions: Regular. Vag. Bleeding: None.  Movement: Present. Denies leaking of fluid.   The following portions of the patient's history were reviewed and updated as appropriate: allergies, current medications, past family history, past medical history, past social history, past surgical history and problem list.   Objective:    Vitals:   02/23/24 1109  BP: 116/79  Pulse: 100  Weight: 194 lb 3.2 oz (88.1 kg)    Fetal Status:  Fetal Heart Rate (bpm): 140   Movement: Present    General: Alert, oriented and cooperative. Patient is in no acute distress.  Skin: Skin is warm and dry. No rash noted.   Cardiovascular: Normal heart rate noted  Respiratory: Normal respiratory effort, no problems with respiration noted  Abdomen: Soft, gravid, appropriate for gestational age.  Pain/Pressure: Present (pain and pressure located in the lower abdomen)     Pelvic: {Blank single:19197::"Cervical exam performed in the presence of a chaperone","Cervical exam deferred"}        Extremities: Normal range of motion.  Edema: Trace  Mental Status: Normal mood and affect. Normal behavior. Normal judgment and thought content.   Assessment and Plan:  Pregnancy: [id number] at [redacted]w[redacted]d  1. Supervision of high risk pregnancy in third trimester (Primary) Patient doing well, feeling regular fetal movement  BP, FHR, FH  appropriate   2. [redacted] weeks gestation of pregnancy Anticipatory guidance about next visits/weeks of pregnancy given.   3. Gestational diabetes mellitus (GDM) affecting pregnancy 39-39.6 w   4. Hypothyroidism, unspecified type Synthroid  25 mcg tablet  Term labor symptoms and general obstetric precautions including but not limited to vaginal bleeding, contractions, leaking of fluid and fetal movement were reviewed in detail with the patient.  Please refer to After Visit Summary for other counseling recommendations.   No follow-ups on file.  No future appointments.   Zeta Bucy E Quashawn Jewkes, PA-C

## 2024-02-23 ENCOUNTER — Ambulatory Visit (INDEPENDENT_AMBULATORY_CARE_PROVIDER_SITE_OTHER): Payer: Self-pay | Admitting: Physician Assistant

## 2024-02-23 ENCOUNTER — Other Ambulatory Visit: Payer: Self-pay

## 2024-02-23 VITALS — BP 116/79 | HR 100 | Wt 194.2 lb

## 2024-02-23 DIAGNOSIS — Z3A38 38 weeks gestation of pregnancy: Secondary | ICD-10-CM

## 2024-02-23 DIAGNOSIS — E039 Hypothyroidism, unspecified: Secondary | ICD-10-CM

## 2024-02-23 DIAGNOSIS — O24419 Gestational diabetes mellitus in pregnancy, unspecified control: Secondary | ICD-10-CM

## 2024-02-23 DIAGNOSIS — O0993 Supervision of high risk pregnancy, unspecified, third trimester: Secondary | ICD-10-CM

## 2024-02-23 NOTE — Progress Notes (Signed)
 After review, MFM consult with provider is not indicated for today  Cassandria Clever, MD 02/23/2024 11:03 AM  [facility]

## 2024-02-24 LAB — TSH RFX ON ABNORMAL TO FREE T4: TSH: 2.28 u[IU]/mL (ref 0.450–4.500)

## 2024-02-25 ENCOUNTER — Inpatient Hospital Stay (HOSPITAL_COMMUNITY): Payer: MEDICAID

## 2024-02-26 ENCOUNTER — Inpatient Hospital Stay (HOSPITAL_COMMUNITY): Payer: MEDICAID | Admitting: Anesthesiology

## 2024-02-26 ENCOUNTER — Inpatient Hospital Stay (HOSPITAL_COMMUNITY)
Admission: RE | Admit: 2024-02-26 | Discharge: 2024-02-27 | DRG: 807 | Disposition: A | Discharge status: Home / Self Care | Payer: MEDICAID | Attending: Obstetrics and Gynecology | Admitting: Obstetrics and Gynecology

## 2024-02-26 ENCOUNTER — Encounter (HOSPITAL_COMMUNITY): Payer: Self-pay | Admitting: Obstetrics & Gynecology

## 2024-02-26 ENCOUNTER — Other Ambulatory Visit: Payer: Self-pay

## 2024-02-26 DIAGNOSIS — E039 Hypothyroidism, unspecified: Secondary | ICD-10-CM | POA: Diagnosis present

## 2024-02-26 DIAGNOSIS — Z833 Family history of diabetes mellitus: Secondary | ICD-10-CM

## 2024-02-26 DIAGNOSIS — O24419 Gestational diabetes mellitus in pregnancy, unspecified control: Principal | ICD-10-CM | POA: Diagnosis present

## 2024-02-26 DIAGNOSIS — Z3A39 39 weeks gestation of pregnancy: Secondary | ICD-10-CM | POA: Diagnosis not present

## 2024-02-26 DIAGNOSIS — Z79890 Hormone replacement therapy (postmenopausal): Secondary | ICD-10-CM | POA: Diagnosis not present

## 2024-02-26 DIAGNOSIS — O139 Gestational [pregnancy-induced] hypertension without significant proteinuria, unspecified trimester: Secondary | ICD-10-CM | POA: Diagnosis not present

## 2024-02-26 DIAGNOSIS — O99284 Endocrine, nutritional and metabolic diseases complicating childbirth: Secondary | ICD-10-CM | POA: Diagnosis present

## 2024-02-26 DIAGNOSIS — O134 Gestational [pregnancy-induced] hypertension without significant proteinuria, complicating childbirth: Secondary | ICD-10-CM | POA: Diagnosis not present

## 2024-02-26 DIAGNOSIS — Z7982 Long term (current) use of aspirin: Secondary | ICD-10-CM

## 2024-02-26 DIAGNOSIS — O24420 Gestational diabetes mellitus in childbirth, diet controlled: Principal | ICD-10-CM | POA: Diagnosis present

## 2024-02-26 DIAGNOSIS — Z8632 Personal history of gestational diabetes: Secondary | ICD-10-CM | POA: Diagnosis present

## 2024-02-26 DIAGNOSIS — O09523 Supervision of elderly multigravida, third trimester: Secondary | ICD-10-CM

## 2024-02-26 DIAGNOSIS — O0993 Supervision of high risk pregnancy, unspecified, third trimester: Secondary | ICD-10-CM

## 2024-02-26 LAB — COMPREHENSIVE METABOLIC PANEL WITH GFR
ALT: 25 U/L (ref 0–44)
AST: 26 U/L (ref 15–41)
Albumin: 2.7 g/dL — ABNORMAL LOW (ref 3.5–5.0)
Alkaline Phosphatase: 162 U/L — ABNORMAL HIGH (ref 38–126)
Anion gap: 10 (ref 5–15)
BUN: 8 mg/dL (ref 6–20)
CO2: 17 mmol/L — ABNORMAL LOW (ref 22–32)
Calcium: 8.9 mg/dL (ref 8.9–10.3)
Chloride: 107 mmol/L (ref 98–111)
Creatinine, Ser: 0.53 mg/dL (ref 0.44–1.00)
GFR, Estimated: >60 mL/min (ref >60)
Glucose, Bld: 89 mg/dL (ref 70–99)
Potassium: 3.3 mmol/L — ABNORMAL LOW (ref 3.5–5.1)
Sodium: 134 mmol/L — ABNORMAL LOW (ref 135–145)
Total Bilirubin: 0.5 mg/dL (ref 0.0–1.2)
Total Protein: 6.1 g/dL — ABNORMAL LOW (ref 6.5–8.1)

## 2024-02-26 LAB — CBC
HCT: 40.4 % (ref 36.0–46.0)
HCT: 42.8 % (ref 36.0–46.0)
Hemoglobin: 13.9 g/dL (ref 12.0–15.0)
Hemoglobin: 14.7 g/dL (ref 12.0–15.0)
MCH: 30.7 pg (ref 26.0–34.0)
MCH: 30.5 pg (ref 26.0–34.0)
MCHC: 34.4 g/dL (ref 30.0–36.0)
MCHC: 34.3 g/dL (ref 30.0–36.0)
MCV: 89.2 fL (ref 80.0–100.0)
MCV: 88.8 fL (ref 80.0–100.0)
Platelets: 284 10*3/uL (ref 150–400)
Platelets: 295 10*3/uL (ref 150–400)
RBC: 4.53 MIL/uL (ref 3.87–5.11)
RBC: 4.82 MIL/uL (ref 3.87–5.11)
RDW: 14.2 % (ref 11.5–15.5)
RDW: 14.3 % (ref 11.5–15.5)
WBC: 10.6 10*3/uL — ABNORMAL HIGH (ref 4.0–10.5)
WBC: 16 10*3/uL — ABNORMAL HIGH (ref 4.0–10.5)
nRBC: 0 % (ref 0.0–0.2)
nRBC: 0 % (ref 0.0–0.2)

## 2024-02-26 LAB — GLUCOSE, CAPILLARY
Glucose-Capillary: 105 mg/dL — ABNORMAL HIGH (ref 70–99)
Glucose-Capillary: 86 mg/dL (ref 70–99)
Glucose-Capillary: 79 mg/dL (ref 70–99)

## 2024-02-26 LAB — TYPE AND SCREEN
ABO/RH(D): O POS
Antibody Screen: NEGATIVE

## 2024-02-26 LAB — RPR: RPR Ser Ql: NONREACTIVE

## 2024-02-26 MED ORDER — PHENYLEPHRINE 80 MCG/ML (10ML) SYRINGE FOR IV PUSH (FOR BLOOD PRESSURE SUPPORT): 80.0000 ug | PREFILLED_SYRINGE | INTRAVENOUS | Status: DC | PRN

## 2024-02-26 MED ORDER — MEDROXYPROGESTERONE ACETATE 150 MG/ML IM SUSP: 150.0000 mg | INTRAMUSCULAR | Status: DC | PRN

## 2024-02-26 MED ORDER — EPHEDRINE 5 MG/ML INJ: 10.0000 mg | INTRAVENOUS | Status: DC | PRN

## 2024-02-26 MED ORDER — BUPIVACAINE HCL (PF) 0.25 % IJ SOLN
INTRAMUSCULAR | Status: DC | PRN
  Administered 2024-02-26: 1.2 mL via INTRATHECAL

## 2024-02-26 MED ORDER — BENZOCAINE-MENTHOL 20-0.5 % EX AERO
1.0000 | INHALATION_SPRAY | CUTANEOUS | Status: DC | PRN
  Administered 2024-02-27: 1 via TOPICAL
  Filled 2024-02-26: qty 56

## 2024-02-26 MED ORDER — DIPHENHYDRAMINE HCL 50 MG/ML IJ SOLN: 12.5000 mg | INTRAMUSCULAR | Status: DC | PRN

## 2024-02-26 MED ORDER — DIBUCAINE (PERIANAL) 1 % EX OINT: 1.0000 | TOPICAL_OINTMENT | CUTANEOUS | Status: DC | PRN

## 2024-02-26 MED ORDER — PRENATAL MULTIVITAMIN CH: 1.0000 | ORAL_TABLET | Freq: Every day | ORAL | Status: DC

## 2024-02-26 MED ORDER — PRENATAL MULTIVITAMIN CH
1.0000 | ORAL_TABLET | Freq: Every day | ORAL | Status: DC
  Administered 2024-02-27: 1 via ORAL
  Filled 2024-02-26: qty 1

## 2024-02-26 MED ORDER — TRANEXAMIC ACID-NACL 1000-0.7 MG/100ML-% IV SOLN
INTRAVENOUS | Status: AC
  Filled 2024-02-26: qty 100

## 2024-02-26 MED ORDER — OXYCODONE-ACETAMINOPHEN 5-325 MG PO TABS: 1.0000 | ORAL_TABLET | ORAL | Status: DC | PRN

## 2024-02-26 MED ORDER — OXYTOCIN BOLUS FROM INFUSION
333.0000 mL | Freq: Once | INTRAVENOUS | Status: AC
Start: 2024-02-26 — End: 2024-02-26
  Administered 2024-02-26: 333 mL via INTRAVENOUS

## 2024-02-26 MED ORDER — LACTATED RINGERS IV SOLN: 500.0000 mL | INTRAVENOUS | Status: DC | PRN

## 2024-02-26 MED ORDER — COCONUT OIL OIL: 1.0000 | TOPICAL_OIL | Status: DC | PRN

## 2024-02-26 MED ORDER — TETANUS-DIPHTH-ACELL PERTUSSIS 5-2.5-18.5 LF-MCG/0.5 IM SUSY: 0.5000 mL | PREFILLED_SYRINGE | Freq: Once | INTRAMUSCULAR | Status: DC

## 2024-02-26 MED ORDER — FERROUS SULFATE 325 (65 FE) MG PO TABS
325.0000 mg | ORAL_TABLET | ORAL | Status: DC
  Administered 2024-02-27: 325 mg via ORAL
  Filled 2024-02-26: qty 1

## 2024-02-26 MED ORDER — MISOPROSTOL 25 MCG QUARTER TABLET
25.0000 ug | ORAL_TABLET | Freq: Once | ORAL | Status: AC
  Administered 2024-02-26: 25 ug via ORAL
  Filled 2024-02-26: qty 1

## 2024-02-26 MED ORDER — OXYTOCIN-SODIUM CHLORIDE 30-0.9 UT/500ML-% IV SOLN
2.5000 [IU]/h | INTRAVENOUS | Status: DC
  Administered 2024-02-26: 2.5 [IU]/h via INTRAVENOUS
  Filled 2024-02-26: qty 500

## 2024-02-26 MED ORDER — METHYLERGONOVINE MALEATE 0.2 MG/ML IJ SOLN: 0.2000 mg | INTRAMUSCULAR | Status: DC | PRN

## 2024-02-26 MED ORDER — MEASLES, MUMPS & RUBELLA VAC IJ SOLR: 0.5000 mL | Freq: Once | INTRAMUSCULAR | Status: DC

## 2024-02-26 MED ORDER — MISOPROSTOL 50MCG HALF TABLET
50.0000 ug | ORAL_TABLET | ORAL | Status: DC
  Administered 2024-02-26: 50 ug via ORAL
  Filled 2024-02-26: qty 1

## 2024-02-26 MED ORDER — FLEET ENEMA RE ENEM: 1.0000 | ENEMA | RECTAL | Status: DC | PRN

## 2024-02-26 MED ORDER — SENNOSIDES-DOCUSATE SODIUM 8.6-50 MG PO TABS
2.0000 | ORAL_TABLET | ORAL | Status: DC
  Administered 2024-02-27: 2 via ORAL
  Filled 2024-02-26: qty 2

## 2024-02-26 MED ORDER — TERBUTALINE SULFATE 1 MG/ML IJ SOLN: 0.2500 mg | Freq: Once | INTRAMUSCULAR | Status: DC | PRN

## 2024-02-26 MED ORDER — IBUPROFEN 600 MG PO TABS
600.0000 mg | ORAL_TABLET | Freq: Four times a day (QID) | ORAL | Status: DC
  Administered 2024-02-27 (×4): 600 mg via ORAL
  Filled 2024-02-26 (×4): qty 1

## 2024-02-26 MED ORDER — ACETAMINOPHEN 325 MG PO TABS
650.0000 mg | ORAL_TABLET | ORAL | Status: DC | PRN
  Administered 2024-02-26: 650 mg via ORAL
  Filled 2024-02-26: qty 2

## 2024-02-26 MED ORDER — SOD CITRATE-CITRIC ACID 500-334 MG/5ML PO SOLN: 30.0000 mL | ORAL | Status: DC | PRN

## 2024-02-26 MED ORDER — WITCH HAZEL-GLYCERIN EX PADS: 1.0000 | MEDICATED_PAD | CUTANEOUS | Status: DC | PRN

## 2024-02-26 MED ORDER — DIPHENHYDRAMINE HCL 25 MG PO CAPS: 25.0000 mg | ORAL_CAPSULE | Freq: Four times a day (QID) | ORAL | Status: DC | PRN

## 2024-02-26 MED ORDER — LACTATED RINGERS IV SOLN
500.0000 mL | Freq: Once | INTRAVENOUS | Status: AC
  Administered 2024-02-26: 500 mL via INTRAVENOUS

## 2024-02-26 MED ORDER — SIMETHICONE 80 MG PO CHEW: 80.0000 mg | CHEWABLE_TABLET | ORAL | Status: DC | PRN

## 2024-02-26 MED ORDER — FENTANYL-BUPIVACAINE-NACL 0.5-0.125-0.9 MG/250ML-% EP SOLN
12.0000 mL/h | EPIDURAL | Status: DC | PRN
  Administered 2024-02-26: 12 mL/h via EPIDURAL
  Filled 2024-02-26: qty 250

## 2024-02-26 MED ORDER — LACTATED RINGERS IV SOLN: INTRAVENOUS | Status: DC

## 2024-02-26 MED ORDER — BISACODYL 10 MG RE SUPP: 10.0000 mg | Freq: Every day | RECTAL | Status: DC | PRN

## 2024-02-26 MED ORDER — METHYLERGONOVINE MALEATE 0.2 MG PO TABS: 0.2000 mg | ORAL_TABLET | ORAL | Status: DC | PRN

## 2024-02-26 MED ORDER — ONDANSETRON HCL 4 MG/2ML IJ SOLN: 4.0000 mg | INTRAMUSCULAR | Status: DC | PRN

## 2024-02-26 MED ORDER — ONDANSETRON HCL 4 MG/2ML IJ SOLN: 4.0000 mg | Freq: Four times a day (QID) | INTRAMUSCULAR | Status: DC | PRN

## 2024-02-26 MED ORDER — ONDANSETRON HCL 4 MG PO TABS: 4.0000 mg | ORAL_TABLET | ORAL | Status: DC | PRN

## 2024-02-26 MED ORDER — FLEET ENEMA RE ENEM: 1.0000 | ENEMA | Freq: Every day | RECTAL | Status: DC | PRN

## 2024-02-26 MED ORDER — ACETAMINOPHEN 325 MG PO TABS
650.0000 mg | ORAL_TABLET | ORAL | Status: DC | PRN
  Administered 2024-02-27: 650 mg via ORAL
  Filled 2024-02-26: qty 2

## 2024-02-26 MED ORDER — FENTANYL CITRATE (PF) 100 MCG/2ML IJ SOLN
INTRAMUSCULAR | Status: AC
  Filled 2024-02-26: qty 2

## 2024-02-26 MED ORDER — TRANEXAMIC ACID-NACL 1000-0.7 MG/100ML-% IV SOLN
1000.0000 mg | INTRAVENOUS | Status: AC
  Administered 2024-02-26: 1000 mg via INTRAVENOUS

## 2024-02-26 MED ORDER — OXYCODONE-ACETAMINOPHEN 5-325 MG PO TABS: 2.0000 | ORAL_TABLET | ORAL | Status: DC | PRN

## 2024-02-26 MED ORDER — LIDOCAINE HCL (PF) 1 % IJ SOLN: 30.0000 mL | INTRAMUSCULAR | Status: DC | PRN

## 2024-02-26 MED ORDER — FENTANYL CITRATE (PF) 100 MCG/2ML IJ SOLN
50.0000 ug | INTRAMUSCULAR | Status: DC | PRN
  Administered 2024-02-26 (×3): 100 ug via INTRAVENOUS
  Filled 2024-02-26 (×2): qty 2

## 2024-02-26 MED ORDER — MISOPROSTOL 25 MCG QUARTER TABLET
25.0000 ug | ORAL_TABLET | Freq: Once | ORAL | Status: AC
  Administered 2024-02-26: 25 ug via VAGINAL
  Filled 2024-02-26: qty 1

## 2024-02-26 NOTE — Progress Notes (Signed)
 Patient ID: [name], female   DOB: [birth year], 38 y.o.   MRN: [id number]  Vitals:   02/26/24 1715 02/26/24 1730  BP: 130/76 131/82  Pulse: (!) 102 95  Resp:    Temp:    SpO2:     Asked to evaluate vaginal bleeding by Odilia Bennett RN due to increase in vaginal clots. RN emptied patient's bladder with in and out catheter and performed fundal massage. SNM finds fundus firm on exam. Small amount of clots manually removed from cervix. 1 g TXA IV verbal order given. Patient reports headache, given Tylenol  650 mg by RN. BP continues to be within normal range.  RN will notify SNM & CNM of any change in status.   Ida Mains, SNM 02/26/24 1745

## 2024-02-26 NOTE — Progress Notes (Signed)
 Patient Vitals for the past 4 hrs:  BP Temp Temp src Pulse Resp  02/26/24 1153 (!) 144/90 -- -- (!) 119 20  02/26/24 1146 -- 98.9 F (37.2 C) Oral -- --  02/26/24 1131 (!) 141/91 -- -- 100 20  02/26/24 0950 (!) 144/82 -- -- 90 --  02/26/24 0830 (!) 148/87 97.8 F (36.6 C) Oral 94 18   Meets criteria for GHTN. FHR Cat 1.  Cx 1/thick/-2. AROM attempted, no fluid seen. Cooks inserted and inflated w/50cc H20. After about an hour, Cooks out and ROM noted w/clear fluid. Ctx much more uncomfortable, q 2-3 minutes. Cx 4/thick/-2.  Seems to be laboring, but will augment w/pitocin  if needed.

## 2024-02-26 NOTE — Anesthesia Procedure Notes (Signed)
 Epidural Patient location during procedure: OB Start time: 02/26/2024 3:30 PM End time: 02/26/2024 3:35 PM  Staffing Anesthesiologist: Conard Decent, MD Performed: anesthesiologist   Preanesthetic Checklist Completed: patient identified, IV checked, site marked, risks and benefits discussed, surgical consent, monitors and equipment checked, pre-op evaluation and timeout performed  Epidural Patient position: sitting Prep: DuraPrep and site prepped and draped Patient monitoring: continuous pulse ox and blood pressure Approach: midline Location: L3-L4 Injection technique: LOR saline  Needle:  Needle type: Tuohy  Needle gauge: 17 G Needle length: 9 cm and 9 Needle insertion depth: 7 cm Catheter type: closed end flexible Catheter size: 19 Gauge Catheter at skin depth: 12 cm Test dose: negative  Assessment Events: blood not aspirated, injection not painful, no injection resistance, no paresthesia and negative IV test  Additional Notes A CSE was placed for labor pain management. Risks and benefits including, but not limited to, infection, bleeding, local anesthetic toxicity, headache, hypotension, back pain, block failure, etc. were discussed with the patient. The patient expressed understanding and consented to the procedure. I confirmed that the patient has no bleeding disorders and is not taking blood thinners. I confirmed the patient's last platelet count with the nurse. A time-out was performed immediately prior to the procedure. Please see nursing documentation for vital signs. Sterile technique was used throughout the whole procedure. Once LOR achieved, a spinal needle was passed through the Tuohy needle with return of clear CSF. The spinal dose was administered. The spinal needle was removed, and the epidural catheter threaded easily without resistance. Aspiration of the catheter was negative for blood and CSF. The epidural infusion was started.  1 attempt(s)Reason for  block:procedure for pain

## 2024-02-26 NOTE — Discharge Summary (Shared)
 Postpartum Discharge Summary  Date of Service updated***     Patient Name: [name] DOB: [birth year] MRN: [id number]  Date of admission: 02/26/2024 Delivery date:02/26/2024 Delivering provider: Ida Mains E Date of discharge: 02/26/2024  Admitting diagnosis: Gestational diabetes mellitus (GDM) [O24.419] Intrauterine pregnancy: [redacted]w[redacted]d     Secondary diagnosis:  Principal Problem:   Gestational diabetes mellitus (GDM) Active Problems:   Gestational hypertension   Vaginal delivery  Additional problems: AMA    Discharge diagnosis: Term Pregnancy Delivered, Gestational Hypertension, and GDM A1                                              Post partum procedures:{Postpartum procedures:23558} Augmentation: AROM, Cytotec , and IP Foley Complications: None  Hospital course: Induction of Labor With Vaginal Delivery   38 y.o. yo [phone] at [redacted]w[redacted]d was admitted to the hospital 02/26/2024 for induction of labor.  Indication for induction: A1 DM.  Patient had an labor course complicated by intrapartum GHTN Membrane Rupture Time/Date: 10:37 AM,02/26/2024  Delivery Method:Vaginal, Spontaneous Operative Delivery:N/A Episiotomy: None Lacerations:    Details of delivery can be found in separate delivery note.  Patient had a postpartum course complicated by***. Patient is discharged home 02/26/24.  Newborn Data: Birth date:02/26/2024 Birth time:4:23 PM Gender:Female Living status:Living Apgars:8 ,  Weight:   Magnesium  Sulfate received: No BMZ received: No Rhophylac:N/A MMR:N/A T-DaP:Given prenatally Flu: N/A RSV Vaccine received: No Transfusion:{Transfusion received:30440034}  Immunizations received: Immunization History  Administered Date(s) Administered   Influenza, Seasonal, Injecte, Preservative Fre 09/07/2023   Influenza,inj,Quad PF,6+ Mos 07/10/2017, 08/10/2019, 06/09/2021, 06/09/2022   Tdap 12/05/2018, 11/30/2023    Physical exam  Vitals:   02/26/24 1550 02/26/24 1557  02/26/24 1611 02/26/24 1630  BP: 119/69 (!) 148/98 (!) 140/75 135/67  Pulse: 90 (!) 106 (!) 124 (!) 116  Resp:      Temp:      TempSrc:      SpO2: 98% 95% 96%   Weight:      Height:       General: {Exam; general:21111117} Lochia: {Desc; appropriate/inappropriate:30686::"appropriate"} Uterine Fundus: {Desc; firm/soft:30687} Incision: {Exam; incision:21111123} DVT Evaluation: {Exam; dvt:2111122} Labs: Lab Results  Component Value Date   WBC 16.0 (H) 02/26/2024   HGB 14.7 02/26/2024   HCT 42.8 02/26/2024   MCV 88.8 02/26/2024   PLT 295 02/26/2024      Latest Ref Rng & Units 02/26/2024    4:29 AM  CMP  Glucose 70 - 99 mg/dL 89   BUN 6 - 20 mg/dL 8   Creatinine 6.06 - 3.01 mg/dL 6.01   Sodium 093 - 235 mmol/L 134   Potassium 3.5 - 5.1 mmol/L 3.3   Chloride 98 - 111 mmol/L 107   CO2 22 - 32 mmol/L 17   Calcium 8.9 - 10.3 mg/dL 8.9   Total Protein 6.5 - 8.1 g/dL 6.1   Total Bilirubin 0.0 - 1.2 mg/dL 0.5   Alkaline Phos 38 - 126 U/L 162   AST 15 - 41 U/L 26   ALT 0 - 44 U/L 25    Edinburgh Score:     No data to display         No data recorded  After visit meds:  Allergies as of 02/26/2024   No Known Allergies   Med Rec must be completed prior to using this [facility]***  Discharge home in stable condition Infant Feeding: {Baby feeding:23562} Infant Disposition:{CHL IP OB HOME WITH ZHYQMV:78469} Discharge instruction: per After Visit Summary and Postpartum booklet. Activity: Advance as tolerated. Pelvic rest for 6 weeks.  Diet: {OB GEXB:28413244} Future Appointments:No future appointments. Follow up Visit:   Please schedule this patient for a In person postpartum visit in 4 weeks with the following provider: Any provider. Additional Postpartum F/U:2 hour GTT and BP check 1 week  Low risk pregnancy complicated by: GDM and HTN Delivery mode:  Vaginal, Spontaneous Anticipated Birth Control:  IUD   02/26/2024 Majel Scott, CNM

## 2024-02-26 NOTE — H&P (Addendum)
 OBSTETRIC ADMISSION HISTORY AND PHYSICAL  [name] is a 38 y.o. female [phone] with IUP at [redacted]w[redacted]d by LMP presenting for IOL for gDM. She reports +FMs, No LOF, no VB, no blurry vision. Reports occasional headaches (2-3x/week), swelling of her hands a feet throughout pregnancy, lower abdominal pain throughout pregnancy, and contractions that started around [redacted] weeks gestation. She plans on breast & formula feeding. She requests IUD for birth control after delivery. She received her prenatal care at [facility]   Dating: By LMP --->  Estimated Date of Delivery: 03/03/24  Sono:   @[redacted]w[redacted]d , CWD, normal anatomy, cephalic presentation, 2516 g, 60% EFW   Prenatal History/Complications:  - Gestational DM, A1: Well-controlled on diet, ASA daily  - Hypothyroidsim: Synthroid  daily  - AMA  Past Medical History: Past Medical History:  Diagnosis Date   Bulimia nervosa    no current problems  (07/2022)   Eczema    GERD (gastroesophageal reflux disease)    Hematochezia 08/19/2021   Hyperthyroidism     Past Surgical History: Past Surgical History:  Procedure Laterality Date   COLONOSCOPY WITH PROPOFOL  N/A 03/18/2016   Procedure: COLONOSCOPY WITH PROPOFOL ;  Surgeon: Ozell Blunt, MD;  Location: [facility];  Service: Endoscopy;  Laterality: N/A;   ESOPHAGOGASTRODUODENOSCOPY (EGD) WITH PROPOFOL  N/A 09/22/2017   Procedure: ESOPHAGOGASTRODUODENOSCOPY (EGD) WITH PROPOFOL ;  Surgeon: Ozell Blunt, MD;  Location: [facility] ENDOSCOPY;  Service: Endoscopy;  Laterality: N/A;   NO PAST SURGERIES      Obstetrical History: OB History     Gravida  7   Para  3   Term  3   Preterm  0   AB  3   Living  3      SAB  3   IAB  0   Ectopic  0   Multiple  0   Live Births  3           Social History Social History   Socioeconomic History   Marital status: Married    Spouse name: Not on file   Number of children: Not on file   Years of education: Not on file   Highest education level: Not on file   Occupational History   Not on file  Tobacco Use   Smoking status: Never   Smokeless tobacco: Never  Vaping Use   Vaping status: Never Used  Substance and Sexual Activity   Alcohol use: Never   Drug use: Never   Sexual activity: Not Currently    Birth control/protection: None  Other Topics Concern   Not on file  Social History Narrative   ** Merged History Encounter **       ** Merged History Encounter **       Lives in [place] since 2005 with husband and 2 children (2011, 2008). [place] from [place]. Spanish speaking.    Social Drivers of Corporate investment banker Strain: Not on file  Food Insecurity: No Food Insecurity (02/26/2024)   Hunger Vital Sign    Worried About Running Out of Food in the Last Year: Never true    Ran Out of Food in the Last Year: Never true  Transportation Needs: No Transportation Needs (02/26/2024)   PRAPARE - Administrator, Civil Service (Medical): No    Lack of Transportation (Non-Medical): No  Physical Activity: Not on file  Stress: Not on file  Social Connections: Not on file    Family History: Family History  Problem Relation Age of Onset  Healthy Mother    Diabetes Father    Hearing loss Neg Hx    Cancer Neg Hx    Heart failure Neg Hx    Heart disease Neg Hx    Hypertension Neg Hx     Allergies: No Known Allergies  Medications Prior to Admission  Medication Sig Dispense Refill Last Dose/Taking   aspirin  EC 81 MG tablet Take 81 mg by mouth daily. Swallow whole.   02/25/2024   levothyroxine  (SYNTHROID ) 25 MCG tablet Take 1 tablet (25 mcg total) by mouth daily. 90 tablet 1 02/25/2024   Prenatal Vit-Fe Fumarate-FA (PRENATAL VITAMIN PO) Take by mouth.   02/25/2024   Blood Glucose Monitoring Suppl (TRUE METRIX METER) w/Device KIT Please check blood glucose 4 times a day. Once in the morning before eating anything (fasting), and 2 hours after breakfast, lunch, and dinner. 1 kit 0    glucose blood test strip Use  as instructed 100 each 12    nitrofurantoin , macrocrystal-monohydrate, (MACROBID ) 100 MG capsule Take 1 capsule (100 mg total) by mouth 2 (two) times daily. (Patient not taking: Reported on 02/26/2024) 10 capsule 0 Not Taking   TRUEplus Lancets 28G MISC Use as instructed 100 each 12      Review of Systems  Per HPI  Blood pressure (!) 134/91, pulse (!) 101, temperature 97.9 F (36.6 C), temperature source Oral, resp. rate 18, height 5\' 3"  (1.6 m), weight 88.3 kg, last menstrual period 05/28/2023, unknown if currently breastfeeding. General appearance: alert, cooperative, and no distress Lungs: CTAB anteriorly. Normal WOB on RA.  Heart: Regular rate and rhythm. Warm and well-perfused.  Abdomen: Soft Extremities: Homans sign is negative, no sign of DVT Presentation: cephalic Fetal monitoringBaseline: 145 bpm, Variability: Good {> 6 bpm), Accelerations: Reactive, and Decelerations: Absent Uterine activity: Variable Dilation: 2 Effacement (%): Thick Station: Ballotable Exam by:: [employer] RN  Prenatal labs: ABO, Rh: --/--/O POS (05/26 0865) Antibody: NEG (05/26 0437) Rubella: 5.34 (10/31 1315) RPR: Non Reactive (02/27 0818)  HBsAg: Negative (10/31 1315)  HIV: Non Reactive (02/27 0818)  GBS: Negative/-- (05/05 1106)  1 hr Glucola: 187 Genetic screening: Unable to locate in patient chart Anatomy US : Female - normal   Prenatal Transfer Tool  Maternal Diabetes: Gestational DM (A1) Genetic Screening: Appears to have declined  Maternal Ultrasounds/Referrals: Normal Fetal Ultrasounds or other Referrals:  Referred to Materal Fetal Medicine  Maternal Substance Abuse:  No Significant Maternal Medications:  Synthroid , ASA, PNV Significant Maternal Lab Results: Group B Strep negative  Results for orders placed or performed during the hospital encounter of 02/26/24 (from the past 24 hours)  CBC   Collection Time: 02/26/24  4:29 AM  Result Value Ref Range   WBC 10.6 (H) 4.0 - 10.5 K/uL    RBC 4.53 3.87 - 5.11 MIL/uL   Hemoglobin 13.9 12.0 - 15.0 g/dL   HCT 78.4 69.6 - 29.5 %   MCV 89.2 80.0 - 100.0 fL   MCH 30.7 26.0 - 34.0 pg   MCHC 34.4 30.0 - 36.0 g/dL   RDW 28.4 13.2 - 44.0 %   Platelets 284 150 - 400 K/uL   nRBC 0.0 0.0 - 0.2 %  Type and screen   Collection Time: 02/26/24  4:37 AM  Result Value Ref Range   ABO/RH(D) O POS    Antibody Screen NEG    Sample Expiration      02/29/2024,2359 Performed at [facility], [address], [place], [place] [address]   Glucose, capillary  Collection Time: 02/26/24  4:58 AM  Result Value Ref Range   Glucose-Capillary 105 (H) 70 - 99 mg/dL    Patient Active Problem List   Diagnosis Date Noted   Gestational diabetes mellitus (GDM) 02/26/2024   Pregnancy related carpal tunnel syndrome in third trimester 01/11/2024   Gestational diabetes mellitus (GDM) affecting pregnancy 12/03/2023   Advanced maternal age in multigravida 09/07/2023   Supervision of high risk pregnancy, antepartum 08/03/2023   Gastro-esophageal reflux disease with esophagitis 08/19/2021   Hypothyroidism 03/10/2021   Depression 04/16/2012   Bulimia nervosa 03/20/2012    Assessment/Plan:  Juelle Dickmann is a 38 y.o. [id number] at [redacted]w[redacted]d here for IOL for gDM.  Single episode of hypertension, rule out gestational HTN/Preeclampsia  #Labor: IOL for gDM #Pain: Prefers without Epidural as tolerated  #FWB: Cat 1 #ID:  GBS negative #MOF: Breast & formula  #MOC: IUD #Circ:  Declined   Carey Chapman, MD  Center for [facility], [facility] 02/26/2024, 5:41 AM  Attestation:  I confirm that I have verified the information documented in the resident's note and that I have also personally reperformed the physical exam and all medical decision making activities.   The patient was seen and examined by me also Agree with note Will Add CMET since initial BP is slightly elevated NST reactive and reassuring UCs as listed Cervical exams as  listed in note  Harlee Lichtenstein, CNM

## 2024-02-26 NOTE — Anesthesia Preprocedure Evaluation (Signed)
 Anesthesia Evaluation  Patient identified by MRN, date of birth, ID band Patient awake    Reviewed: Allergy & Precautions, NPO status , Patient's Chart, lab work & pertinent test results  History of Anesthesia Complications Negative for: history of anesthetic complications  Airway Mallampati: III  TM Distance: >3 FB Neck ROM: Full    Dental   Pulmonary neg pulmonary ROS   Pulmonary exam normal breath sounds clear to auscultation       Cardiovascular hypertension (gestational),  Rhythm:Regular Rate:Normal     Neuro/Psych  PSYCHIATRIC DISORDERS (h/o bulimia)  Depression    negative neurological ROS     GI/Hepatic Neg liver ROS,GERD  ,,  Endo/Other  diabetes, GestationalHypothyroidism Hyperthyroidism   Renal/GU negative Renal ROS     Musculoskeletal   Abdominal   Peds  Hematology negative hematology ROS (+) Lab Results      Component                Value               Date                      WBC                      16.0 (H)            02/26/2024                HGB                      14.7                02/26/2024                HCT                      42.8                02/26/2024                MCV                      88.8                02/26/2024                PLT                      295                 02/26/2024              Anesthesia Other Findings Takes baby aspirin   Reproductive/Obstetrics (+) Pregnancy                             Anesthesia Physical Anesthesia Plan  ASA: 2  Anesthesia Plan: Epidural   Post-op Pain Management:    Induction:   PONV Risk Score and Plan:   Airway Management Planned: Natural Airway  Additional Equipment:   Intra-op Plan:   Post-operative Plan:   Informed Consent: I have reviewed the patients History and Physical, chart, labs and discussed the procedure including the risks, benefits and alternatives for the proposed anesthesia  with the patient or authorized representative who has indicated his/her understanding and acceptance.  Plan Discussed with: Anesthesiologist  Anesthesia Plan Comments: (I have discussed risks of neuraxial anesthesia including but not limited to infection, bleeding, nerve injury, back pain, headache, seizures, and failure of block. Patient denies bleeding disorders and is not currently anticoagulated. Labs have been reviewed. Risks and benefits discussed. All patient's questions answered.  )       Anesthesia Quick Evaluation

## 2024-02-27 ENCOUNTER — Other Ambulatory Visit (HOSPITAL_COMMUNITY): Payer: Self-pay

## 2024-02-27 MED ORDER — ACETAMINOPHEN 325 MG PO TABS
650.0000 mg | ORAL_TABLET | ORAL | 0 refills | Status: DC | PRN
  Filled 2024-02-27: qty 30, 3d supply, fill #0

## 2024-02-27 MED ORDER — FUROSEMIDE 20 MG PO TABS
20.0000 mg | ORAL_TABLET | Freq: Every day | ORAL | 0 refills | Status: DC
  Filled 2024-02-27: qty 5, 5d supply, fill #0

## 2024-02-27 MED ORDER — IBUPROFEN 600 MG PO TABS
600.0000 mg | ORAL_TABLET | Freq: Four times a day (QID) | ORAL | 0 refills | Status: DC
Start: 2024-02-27 — End: 2024-03-25
  Filled 2024-02-27: qty 30, 8d supply, fill #0

## 2024-02-27 MED ORDER — POTASSIUM CHLORIDE CRYS ER 20 MEQ PO TBCR
20.0000 meq | EXTENDED_RELEASE_TABLET | Freq: Every day | ORAL | Status: DC
  Administered 2024-02-27: 20 meq via ORAL
  Filled 2024-02-27: qty 1

## 2024-02-27 MED ORDER — POTASSIUM CHLORIDE CRYS ER 20 MEQ PO TBCR
20.0000 meq | EXTENDED_RELEASE_TABLET | Freq: Every day | ORAL | 0 refills | Status: DC
  Filled 2024-02-27: qty 5, 5d supply, fill #0

## 2024-02-27 MED ORDER — FERROUS SULFATE 325 (65 FE) MG PO TABS
325.0000 mg | ORAL_TABLET | ORAL | 3 refills | Status: DC
  Filled 2024-02-27: qty 30, 60d supply, fill #0

## 2024-02-27 MED ORDER — SENNOSIDES-DOCUSATE SODIUM 8.6-50 MG PO TABS
2.0000 | ORAL_TABLET | ORAL | 0 refills | Status: DC
  Filled 2024-02-27: qty 30, 15d supply, fill #0

## 2024-02-27 MED ORDER — FUROSEMIDE 20 MG PO TABS
20.0000 mg | ORAL_TABLET | Freq: Every day | ORAL | Status: DC
  Administered 2024-02-27: 20 mg via ORAL
  Filled 2024-02-27: qty 1

## 2024-02-27 NOTE — Anesthesia Postprocedure Evaluation (Signed)
 Anesthesia Post Note  Patient: [name]  Procedure(s) Performed: AN AD HOC LABOR EPIDURAL     Patient location during evaluation: Mother Baby Anesthesia Type: Epidural Level of consciousness: awake, oriented and awake and alert Pain management: pain level controlled Vital Signs Assessment: post-procedure vital signs reviewed and stable Respiratory status: spontaneous breathing, nonlabored ventilation and respiratory function stable Cardiovascular status: stable Postop Assessment: no headache, patient able to bend at knees, adequate PO intake, no apparent nausea or vomiting, able to ambulate and no backache Anesthetic complications: no   No notable events documented.  Last Vitals:  Vitals:   02/27/24 0030 02/27/24 0430  BP: 109/69 123/82  Pulse: (!) 102 96  Resp: 18 18  Temp: 36.9 C 36.6 C  SpO2: 100% 100%    Last Pain:  Vitals:   02/27/24 0720  TempSrc:   PainSc: 0-No pain   Pain Goal:                   Madolin Twaddle

## 2024-02-27 NOTE — Social Work (Signed)
 MOB was referred for history of depression/anxiety.  * Referral screened out by Clinical Social Worker because none of the following criteria appear to apply:  ~ History of anxiety/depression during this pregnancy, or of post-partum depression following prior delivery.  ~ Diagnosis of anxiety and/or depression within last 3 years OR * MOB's symptoms currently being treated with medication and/or therapy.  Per chart review MOB's diagnosis dates back to 2013, Per OB records no MH concerns noted during this pregnancy. Edinburgh=0  Please contact the Clinical Social Worker if needs arise, or by MOB request.   Haroldine Likens, LCSWA Clinical Social Worker [phone]

## 2024-02-27 NOTE — Progress Notes (Signed)
 POSTPARTUM PROGRESS NOTE  Post Partum Day 1  Subjective:  [name] is a 38 y.o. Z6X0960 s/p SVD at [redacted]w[redacted]d.  She reports she is doing well. No acute events overnight. She denies any problems with ambulating, voiding or po intake. Denies nausea or vomiting.  Pain is well controlled.  Lochia is appropriate.  Objective: Blood pressure 123/82, pulse 96, temperature 97.9 F (36.6 C), temperature source Oral, resp. rate 18, height 5\' 3"  (1.6 m), weight 88.3 kg, last menstrual period 05/28/2023, SpO2 100%, unknown if currently breastfeeding.  Physical Exam:  General: alert, cooperative and no distress Chest: no respiratory distress Heart:regular rate, distal pulses intact Uterine Fundus: firm, appropriately tender DVT Evaluation: No calf swelling or tenderness Extremities: trace edema Skin: warm, dry  Recent Labs    02/26/24 0429 02/26/24 1458  HGB 13.9 14.7  HCT 40.4 42.8    Assessment/Plan: [name] is a 38 y.o. [id number] s/p SVD at [redacted]w[redacted]d   PPD#1 - Doing well  Routine postpartum care A1GDM - f/u fasting CBG GHTN - started Lasix 20mg  every day + K PO supplementation 20meq every day, f/u Bps  Contraception: IUD Feeding: both Dispo: Plan for discharge 5/27 if BP controlled on lasix+K vs 5/28 if starting procardia.   LOS: 1 day   Ebony Goldstein, MD OB Fellow  02/27/2024, 12:23 PM

## 2024-02-27 NOTE — Plan of Care (Signed)
  Problem: Education: Goal: Knowledge of General Education information will improve Description: Including pain rating scale, medication(s)/side effects and non-pharmacologic comfort measures Outcome: Progressing   Problem: Health Behavior/Discharge Planning: Goal: Ability to manage health-related needs will improve Outcome: Progressing   Problem: Clinical Measurements: Goal: Ability to maintain clinical measurements within normal limits will improve Outcome: Progressing Goal: Will remain free from infection Outcome: Progressing Goal: Diagnostic test results will improve Outcome: Progressing Goal: Respiratory complications will improve Outcome: Progressing Goal: Cardiovascular complication will be avoided Outcome: Progressing   Problem: Activity: Goal: Risk for activity intolerance will decrease Outcome: Progressing   Problem: Coping: Goal: Level of anxiety will decrease Outcome: Progressing   Problem: Elimination: Goal: Will not experience complications related to bowel motility Outcome: Progressing Goal: Will not experience complications related to urinary retention Outcome: Progressing   Problem: Pain Managment: Goal: General experience of comfort will improve and/or be controlled Outcome: Progressing   Problem: Safety: Goal: Ability to remain free from injury will improve Outcome: Progressing   Problem: Skin Integrity: Goal: Risk for impaired skin integrity will decrease Outcome: Progressing   Problem: Education: Goal: Knowledge of condition will improve Outcome: Progressing Goal: Individualized Educational Video(s) Outcome: Progressing Goal: Individualized Newborn Educational Video(s) Outcome: Progressing   Problem: Activity: Goal: Will verbalize the importance of balancing activity with adequate rest periods Outcome: Progressing Goal: Ability to tolerate increased activity will improve Outcome: Progressing   Problem: Coping: Goal: Ability to  identify and utilize available resources and services will improve Outcome: Progressing

## 2024-02-27 NOTE — Patient Instructions (Signed)
 If interested in an outpatient lactation consult in office or virtually please reach out to us  at [facility] (First Floor) [address] [place] Please call [phone] and press 4 for lactation.    Si est interesado en una consulta ambulatoria sobre lactancia en el consultorio o virtualmente, comunquese con nosotros al [facility] Geophysicist/field seismologist (primer piso) [address] [place] [place], [place] Por favor llame al [phone] y presione 4 para lactancia.  [name], [facility] for [facility]

## 2024-02-28 ENCOUNTER — Telehealth: Payer: Self-pay | Admitting: Lactation Services

## 2024-02-28 NOTE — Telephone Encounter (Signed)
 Called patient with assistance of [employer], in house Research officer, trade union.   Patient wants to know if she can continue to take her Thyroid  medicine after delivery and while breast feeding the baby. Reviewed she needs to continue the medication as prescribed.   Patient requested information on follow up appt. Reviewed with patient that they should be calling for 1 week BP check and 2 hour gtt. Will message front desk. Patient informed front desk will be calling to schedule.

## 2024-02-29 ENCOUNTER — Encounter: Payer: Self-pay | Admitting: Obstetrics and Gynecology

## 2024-03-05 ENCOUNTER — Other Ambulatory Visit: Payer: Self-pay

## 2024-03-05 DIAGNOSIS — O24419 Gestational diabetes mellitus in pregnancy, unspecified control: Secondary | ICD-10-CM

## 2024-03-08 ENCOUNTER — Other Ambulatory Visit: Payer: Self-pay

## 2024-03-08 ENCOUNTER — Ambulatory Visit (INDEPENDENT_AMBULATORY_CARE_PROVIDER_SITE_OTHER): Payer: Self-pay

## 2024-03-08 VITALS — BP 132/98 | HR 91 | Ht 63.0 in | Wt 172.2 lb

## 2024-03-08 DIAGNOSIS — O24419 Gestational diabetes mellitus in pregnancy, unspecified control: Secondary | ICD-10-CM

## 2024-03-08 DIAGNOSIS — Z0130 Encounter for examination of blood pressure without abnormal findings: Secondary | ICD-10-CM

## 2024-03-08 MED ORDER — NIFEDIPINE ER OSMOTIC RELEASE 30 MG PO TB24
30.0000 mg | ORAL_TABLET | Freq: Every day | ORAL | 1 refills | Status: AC
  Filled 2024-03-08: qty 30, 30d supply, fill #0

## 2024-03-08 NOTE — Progress Notes (Signed)
 Pt here today for BP check s/p vaginal delivery on 02/26/24.  With [facility] # [phone] pt denies any concerns.  Pt reports light amount of VB and mild pain.  Pt also denies headaches and visual disturbances. Pt declines scheduling an appt with [facility] at this time.   BP LA 128/91.  Rpt 132/98. Reviewed chart with Ilona Malta, MD who recommends that the pt start taking Nifedipine 30 mg po daily and come back in one week for BP check.  Pt notified providers recommendation and agreed to appt on 03/15/24 at 0940.   Patrici Minnis,RN  03/08/24

## 2024-03-09 ENCOUNTER — Ambulatory Visit: Payer: Self-pay | Admitting: Family Medicine

## 2024-03-09 LAB — GLUCOSE TOLERANCE, 2 HOURS
Glucose, 2 hour: 120 mg/dL (ref 70–139)
Glucose, GTT - Fasting: 83 mg/dL (ref 70–99)

## 2024-03-14 ENCOUNTER — Telehealth: Payer: Self-pay

## 2024-03-14 NOTE — Telephone Encounter (Signed)
 Called [name] in response to her MyChart message. [name] sent MyChart message asking for confirmation if it is safe to take Nifedipine  while breastfeeding.  Reassured [name] that it is safe to take Nifedipine  as prescribed while she is breastfeeding and confirmed that she has been taking it daily.  Reminded [name] of her nurse visit appointment for blood pressure check at 9:40am on 03/15/24.  [name] verbalized understanding with no further questions.  [place] spanish interpreter, Eda, present for call.    Carolynne Citron, RN

## 2024-03-15 ENCOUNTER — Ambulatory Visit (INDEPENDENT_AMBULATORY_CARE_PROVIDER_SITE_OTHER): Payer: Self-pay | Admitting: *Deleted

## 2024-03-15 ENCOUNTER — Other Ambulatory Visit: Payer: Self-pay

## 2024-03-15 VITALS — BP 120/85 | HR 88 | Ht 63.0 in | Wt 171.0 lb

## 2024-03-15 DIAGNOSIS — Z0130 Encounter for examination of blood pressure without abnormal findings: Secondary | ICD-10-CM

## 2024-03-15 NOTE — Progress Notes (Signed)
 Here for BP check 1 week after starting Procardia  for elevated BP at BP check 03/08/24. She states she started it same day last week and did take it today. Denies HA, still has trace edema after standing but states it goes away with lying down. BP wnl at 120/85. Advised to continue taking Procardia  as ordered. Reviewed postpartum visit. Reviewed signs of Pre-eclampsia. Patient voices understanding. Alejandra Hurst

## 2024-03-25 ENCOUNTER — Other Ambulatory Visit: Payer: Self-pay

## 2024-03-25 ENCOUNTER — Encounter: Payer: Self-pay | Admitting: Obstetrics and Gynecology

## 2024-03-25 ENCOUNTER — Ambulatory Visit (INDEPENDENT_AMBULATORY_CARE_PROVIDER_SITE_OTHER): Payer: Self-pay | Admitting: Obstetrics and Gynecology

## 2024-03-25 DIAGNOSIS — Z8759 Personal history of other complications of pregnancy, childbirth and the puerperium: Secondary | ICD-10-CM | POA: Diagnosis not present

## 2024-03-25 DIAGNOSIS — Z8632 Personal history of gestational diabetes: Secondary | ICD-10-CM | POA: Diagnosis not present

## 2024-03-25 NOTE — Progress Notes (Signed)
 Post Partum Visit Note  [name] is a 38 y.o. [id number] female who presents for a postpartum visit. She is 4 weeks postpartum following a normal spontaneous vaginal delivery.  I have fully reviewed the prenatal and intrapartum course. The delivery was at [redacted]w[redacted]d gestational weeks.  Anesthesia: epidural. Postpartum course has been good. Baby is doing well. Baby is feeding by both breast and bottle -  . Bleeding staining only. Bowel function is constipation. Bladder function is normal. Patient is not sexually active. Contraception method is IUD-[facility]. Postpartum depression screening: negative.   [facility] - 03/25/24 1459       Pregnancy Intention Screening   Does the patient want to become pregnant in the next year? No    Does the patient's partner want to become pregnant in the next year? No    Would the patient like to discuss contraceptive options today? No      Contraception Wrap Up   Current Method No Contraceptive Precautions    End Method No Contraception Precautions    Contraception Counseling Provided No    How was the end contraceptive method provided? N/A         The pregnancy intention screening data noted above was reviewed. Potential methods of contraception were discussed. The patient elected to proceed with No Contraception Precautions.    Health Maintenance Due  Topic Date Due   HPV VACCINES (1 - 3-dose SCDM series) Never done    The following portions of the patient's history were reviewed and updated as appropriate: allergies, current medications, past family history, past medical history, past social history, past surgical history, and problem list.  Review of Systems Pertinent items are noted in HPI.  Objective:  BP 122/81   Pulse 85   Wt 167 lb (75.8 kg)   LMP 05/28/2023 (Exact Date)   Breastfeeding Yes   BMI 29.58 kg/m    General:  alert and cooperative   Breasts:  not indicated  Lungs: Normal effort           GU exam:  not indicated        Assessment:  1. Postpartum exam (Primary) Desires IUD for contraception. Planning to get at [facility]  2. Gestational hypertension, antepartum Taking procardia , discussed BP check follow up. Instructed to d/c meds 2 days prior to appt.   3. Gestational diabetes mellitus (GDM), antepartum, gestational diabetes method of control unspecified Normal 2hr pp    Plan:   Essential components of care per ACOG recommendations:  1.  Mood and well being: Patient with negative depression screening today. Reviewed local resources for support.  - Patient tobacco use? No.   - hx of drug use? No.    2. Infant care and feeding:  -Patient currently breastmilk feeding? yes  -Social determinants of health (SDOH) reviewed in EPIC. No concerns  3. Sexuality, contraception and birth spacing - Patient does not want a pregnancy in the next year.  - Reviewed reproductive life planning. Reviewed contraceptive methods based on pt preferences and effectiveness.  Patient desired IUD or IUS . She does not have insurance, will get at [facility] - Discussed birth spacing of 18 months  4. Sleep and fatigue -Encouraged family/partner/community support of 4 hrs of uninterrupted sleep to help with mood and fatigue  5. Physical Recovery  - Discussed patients delivery and complications. She describes her labor as good. - Patient had a Vaginal, no problems at delivery. Patient had a periurethral, not repaired laceration. Perineal healing reviewed. Patient expressed  understanding - Patient has urinary incontinence? No. - Patient is safe to resume physical and sexual activity  6.  Health Maintenance - HM due items addressed Yes - Last pap smear  Diagnosis  Date Value Ref Range Status  06/09/2021   Final   - Negative for intraepithelial lesion or malignancy (NILM)   Pap smear not done at today's visit.  -Breast Cancer screening indicated? No.   7. Chronic Disease/Pregnancy Condition follow up: Hypertension  - PCP follow  up  Nidia Daring, FNP Center for [facility], [facility]

## 2024-04-03 ENCOUNTER — Other Ambulatory Visit: Payer: Self-pay

## 2024-04-03 ENCOUNTER — Ambulatory Visit (INDEPENDENT_AMBULATORY_CARE_PROVIDER_SITE_OTHER): Payer: Self-pay | Admitting: *Deleted

## 2024-04-03 VITALS — BP 123/84 | HR 75 | Ht 63.0 in | Wt 165.1 lb

## 2024-04-03 DIAGNOSIS — R300 Dysuria: Secondary | ICD-10-CM

## 2024-04-03 DIAGNOSIS — Z0130 Encounter for examination of blood pressure without abnormal findings: Secondary | ICD-10-CM

## 2024-04-03 DIAGNOSIS — R109 Unspecified abdominal pain: Secondary | ICD-10-CM

## 2024-04-03 DIAGNOSIS — Z8759 Personal history of other complications of pregnancy, childbirth and the puerperium: Secondary | ICD-10-CM | POA: Diagnosis not present

## 2024-04-03 LAB — POCT URINALYSIS DIP (DEVICE)
Bilirubin Urine: NEGATIVE
Glucose, UA: NEGATIVE mg/dL
Ketones, ur: NEGATIVE mg/dL
Leukocytes,Ua: NEGATIVE
Nitrite: NEGATIVE
Protein, ur: NEGATIVE mg/dL
Specific Gravity, Urine: 1.025 (ref 1.005–1.030)
Urobilinogen, UA: 0.2 mg/dL (ref 0.0–1.0)
pH: 5.5 (ref 5.0–8.0)

## 2024-04-03 NOTE — Progress Notes (Signed)
 Pt presents for PP blood pressure check following PP visit on 6/23.  She states that she last took Procardia  on 6/29. She denies having H/A or visual disturbances. BP = 123/84, P - 75. Per consult w/Dr. Zina, pt was advised to discontinue Procardia .  Pt c/o mild Rt flank pain, blood tinged urine and painful urination. Random UA is not indicative of UTI - urine culture sent due to pt symptoms. She was advised that she will receive results via Mychart. Pt voiced understanding of all information and instructions given.

## 2024-04-05 LAB — URINE CULTURE

## 2024-04-08 ENCOUNTER — Ambulatory Visit: Payer: Self-pay | Admitting: Obstetrics and Gynecology

## 2024-04-24 ENCOUNTER — Other Ambulatory Visit: Payer: Self-pay

## 2024-07-05 ENCOUNTER — Telehealth: Payer: Self-pay | Admitting: Family Medicine

## 2024-07-05 DIAGNOSIS — E039 Hypothyroidism, unspecified: Secondary | ICD-10-CM

## 2024-07-05 NOTE — Telephone Encounter (Signed)
 Copied from CRM #[id number]. Topic: Clinical - Medication Refill >> Jul 05, 2024 12:41 PM Fonda T wrote: Medication: levothyroxine  (SYNTHROID ) 25 MCG tablet  Spanish Interpreter assisted call, Achille,  [phone]  Patient requesting a refill of medication, states she is no longer pregnant, and was advised to contact PCP to further refills.  Has the patient contacted their pharmacy? No   This is the patient's preferred pharmacy:   [facility] [address] [place] [address] Phone: [phone] Fax: [phone]    Is this the correct pharmacy for this prescription? Yes If no, delete pharmacy and type the correct one.   Has the prescription been filled recently? Yes  Is the patient out of the medication? Yes  Has the patient been seen for an appointment in the last year OR does the patient have an upcoming appointment? Yes (last seen 12/12/23)  Can we respond through MyChart? Yes, per patient states she would like both response via mychart and ph. call, [phone]  Agent: Please be advised that Rx refills may take up to 3 business days. We ask that you follow-up with your pharmacy.

## 2024-07-08 ENCOUNTER — Other Ambulatory Visit: Payer: Self-pay

## 2024-07-08 MED ORDER — LEVOTHYROXINE SODIUM 25 MCG PO TABS
25.0000 ug | ORAL_TABLET | Freq: Every day | ORAL | 0 refills | Status: DC
  Filled 2024-07-08 – 2024-07-24 (×2): qty 30, 30d supply, fill #0

## 2024-07-08 NOTE — Telephone Encounter (Signed)
 Requested medication (s) are due for refill today: yes  Requested medication (s) are on the active medication list: yes  Last refill:  01/11/24  Future visit scheduled: no  Notes to clinic:  Unable to refill per protocol, last refill by another provider.      Requested Prescriptions  Pending Prescriptions Disp Refills   levothyroxine  (SYNTHROID ) 25 MCG tablet 90 tablet 1    Sig: Take 1 tablet (25 mcg total) by mouth daily.     Endocrinology:  Hypothyroid Agents Passed - 07/08/2024  9:22 AM      Passed - TSH in normal range and within 360 days    TSH  Date Value Ref Range Status  02/23/2024 2.280 0.450 - 4.500 uIU/mL Final  11/30/2023 2.840 0.450 - 4.500 uIU/mL Final         Passed - Valid encounter within last 12 months    Recent Outpatient Visits           6 months ago Acquired hypothyroidism   [facility] [place]. [facility] Delbert Clam, MD   1 year ago Chronic midline thoracic back pain   [facility] [place]. [facility] Delbert Clam, MD   2 years ago Acquired hypothyroidism   [facility] [place]. [facility] Delbert Clam, MD   2 years ago Acquired hypothyroidism   [facility] [place] [facility] [place]. [facility] Delbert Clam, MD   3 years ago Annual physical exam   [facility] [place]. [facility] Delbert Clam, MD

## 2024-07-24 ENCOUNTER — Other Ambulatory Visit: Payer: Self-pay

## 2024-08-26 ENCOUNTER — Other Ambulatory Visit: Payer: Self-pay | Admitting: Family Medicine

## 2024-08-26 DIAGNOSIS — E039 Hypothyroidism, unspecified: Secondary | ICD-10-CM

## 2024-08-27 ENCOUNTER — Other Ambulatory Visit: Payer: Self-pay | Admitting: Family Medicine

## 2024-08-27 DIAGNOSIS — E039 Hypothyroidism, unspecified: Secondary | ICD-10-CM

## 2024-08-28 ENCOUNTER — Other Ambulatory Visit: Payer: Self-pay | Admitting: Family Medicine

## 2024-08-28 ENCOUNTER — Telehealth: Payer: Self-pay

## 2024-08-28 ENCOUNTER — Other Ambulatory Visit: Payer: Self-pay

## 2024-08-28 DIAGNOSIS — E039 Hypothyroidism, unspecified: Secondary | ICD-10-CM

## 2024-08-28 MED ORDER — LEVOTHYROXINE SODIUM 25 MCG PO TABS
25.0000 ug | ORAL_TABLET | Freq: Every day | ORAL | 0 refills | Status: DC
  Filled 2024-08-28: qty 30, 30d supply, fill #0

## 2024-08-28 NOTE — Addendum Note (Signed)
 Addended by: Breylen Agyeman on: 08/28/2024 04:32 PM   Modules accepted: Orders

## 2024-08-28 NOTE — Telephone Encounter (Signed)
 I have sent a refill.  Please have her schedule an appointment with me.

## 2024-08-28 NOTE — Telephone Encounter (Signed)
 Copied from CRM [id number]. Topic: Clinical - Prescription Issue >> Aug 28, 2024 11:51 AM Avram MATSU wrote: Reason for CRM: Patient is requesting a refill for levothyroxine  (SYNTHROID ) 25 MCG tablet [[id number]] was informed of the turnaround.

## 2024-08-28 NOTE — Telephone Encounter (Signed)
 Routing to PCP for review.  Does patient need an appointment before refill can be sent.

## 2024-09-22 ENCOUNTER — Other Ambulatory Visit: Payer: Self-pay | Admitting: Family Medicine

## 2024-09-22 DIAGNOSIS — E039 Hypothyroidism, unspecified: Secondary | ICD-10-CM

## 2024-09-27 ENCOUNTER — Other Ambulatory Visit: Payer: Self-pay

## 2024-09-28 ENCOUNTER — Other Ambulatory Visit: Payer: Self-pay | Admitting: Family Medicine

## 2024-09-28 DIAGNOSIS — E039 Hypothyroidism, unspecified: Secondary | ICD-10-CM

## 2024-10-07 DIAGNOSIS — E039 Hypothyroidism, unspecified: Secondary | ICD-10-CM

## 2024-10-08 NOTE — Telephone Encounter (Signed)
 Duplicate request, OV needed.  Requested Prescriptions  Pending Prescriptions Disp Refills   levothyroxine  (SYNTHROID ) 25 MCG tablet 30 tablet 0    Sig: Take 1 tablet (25 mcg total) by mouth daily. Please schedule PCP visit for additional refills.     Endocrinology:  Hypothyroid Agents Passed - 10/08/2024  2:48 PM      Passed - TSH in normal range and within 360 days    TSH  Date Value Ref Range Status  02/23/2024 2.280 0.450 - 4.500 uIU/mL Final  11/30/2023 2.840 0.450 - 4.500 uIU/mL Final         Passed - Valid encounter within last 12 months    Recent Outpatient Visits           10 months ago Acquired hypothyroidism   [facility] [place]. [facility] Delbert Clam, MD   1 year ago Chronic midline thoracic back pain   [facility] [place] [place]. [facility] Delbert Clam, MD   2 years ago Acquired hypothyroidism   [facility] [facility] [place]. [facility] Delbert Clam, MD   2 years ago Acquired hypothyroidism   [facility] [place] [facility] [place]. [facility] Delbert Clam, MD   3 years ago Annual physical exam   [facility] [place] [facility] Dept Of . [facility] Delbert Clam, MD

## 2024-10-10 ENCOUNTER — Other Ambulatory Visit: Payer: Self-pay | Admitting: Family Medicine

## 2024-10-10 DIAGNOSIS — E039 Hypothyroidism, unspecified: Secondary | ICD-10-CM

## 2024-10-11 ENCOUNTER — Other Ambulatory Visit: Payer: Self-pay

## 2024-10-11 ENCOUNTER — Other Ambulatory Visit: Payer: Self-pay | Admitting: Family Medicine

## 2024-10-11 DIAGNOSIS — E039 Hypothyroidism, unspecified: Secondary | ICD-10-CM

## 2024-10-11 MED ORDER — LEVOTHYROXINE SODIUM 25 MCG PO TABS
25.0000 ug | ORAL_TABLET | Freq: Every day | ORAL | 0 refills | Status: DC
  Filled 2024-10-11: qty 30, 30d supply, fill #0

## 2024-10-15 ENCOUNTER — Other Ambulatory Visit: Payer: Self-pay

## 2024-10-25 ENCOUNTER — Encounter: Payer: Self-pay | Admitting: *Deleted

## 2024-10-25 ENCOUNTER — Ambulatory Visit: Payer: Self-pay

## 2024-10-25 ENCOUNTER — Other Ambulatory Visit: Payer: Self-pay

## 2024-10-25 ENCOUNTER — Ambulatory Visit: Payer: Self-pay | Attending: *Deleted | Admitting: *Deleted

## 2024-10-25 VITALS — BP 108/74 | HR 78 | Temp 98.1°F | Ht 63.0 in | Wt 152.2 lb

## 2024-10-25 DIAGNOSIS — Z0000 Encounter for general adult medical examination without abnormal findings: Secondary | ICD-10-CM

## 2024-10-25 DIAGNOSIS — Z131 Encounter for screening for diabetes mellitus: Secondary | ICD-10-CM

## 2024-10-25 DIAGNOSIS — Z8632 Personal history of gestational diabetes: Secondary | ICD-10-CM

## 2024-10-25 DIAGNOSIS — R300 Dysuria: Secondary | ICD-10-CM

## 2024-10-25 DIAGNOSIS — E039 Hypothyroidism, unspecified: Secondary | ICD-10-CM

## 2024-10-25 DIAGNOSIS — T85848A Pain due to other internal prosthetic devices, implants and grafts, initial encounter: Secondary | ICD-10-CM

## 2024-10-25 DIAGNOSIS — G8929 Other chronic pain: Secondary | ICD-10-CM

## 2024-10-25 LAB — POCT URINALYSIS DIP (CLINITEK)
Bilirubin, UA: NEGATIVE
Blood, UA: NEGATIVE
Glucose, UA: NEGATIVE mg/dL
Ketones, POC UA: NEGATIVE mg/dL
Leukocytes, UA: NEGATIVE
Nitrite, UA: NEGATIVE
POC PROTEIN,UA: NEGATIVE
Spec Grav, UA: 1.02
Urobilinogen, UA: 0.2 U/dL
pH, UA: 7

## 2024-10-25 LAB — POCT GLYCOSYLATED HEMOGLOBIN (HGB A1C): Hemoglobin A1C: 5.6 % (ref 4.0–5.6)

## 2024-10-25 LAB — GLUCOSE, POCT (MANUAL RESULT ENTRY): POC Glucose: 98 mg/dL (ref 70–99)

## 2024-10-25 MED ORDER — AMOXICILLIN 500 MG PO CAPS
500.0000 mg | ORAL_CAPSULE | Freq: Three times a day (TID) | ORAL | 0 refills | Status: AC
  Filled 2024-10-25: qty 30, 10d supply, fill #0

## 2024-10-25 NOTE — Assessment & Plan Note (Signed)
 As above, patient has history of gestational diabetes and has been checking her blood sugar.  She wonders if she can discontinue this procedure. Point-of-care testing revealed a glucose of 89 and an A1c of 5.6.  Okay to discontinue glucose checks

## 2024-10-25 NOTE — Telephone Encounter (Signed)
 Noted.

## 2024-10-25 NOTE — Assessment & Plan Note (Signed)
 As above, patient request TSH and refill of her thyroid  medication. She denies any symptom of concern related to thyroid  disorder. Okay for TSH. Will refill levothyroxine  based on lab results Orders:   TSH

## 2024-10-25 NOTE — Patient Instructions (Addendum)
 Today we discussed your facial swelling with mild erythema of your left upper gum  (under your dental plate) I think you should take a round of amoxicillin  for that. We also discussed your hypothyroidism on levothyroxine  25 mcg.  Will get a TSH today and refill your medication. We did discuss your gestational diabetes. Your blood sugar was 89 today with a A1c of 5.6.  This is a normal blood sugar and A1c.  I do not think you need to check your blood sugar any longer Keep your follow-up with your primary care provider

## 2024-10-25 NOTE — Progress Notes (Signed)
 "   Patient ID: [name], female    DOB: [birth year]  MRN: [id number]  CC: Acute Visit (swelling in the Left side of the face X1 week)   Subjective: [name] is a 39 y.o. female who presents for evaluation of facial swelling with inflammation of the left gumline under dental plate.   She reports intermittent swelling of the face and eyes.  No dental pain. She has examined her gum and finds a mildly irritated area on the left front gumline. She denies fever, chills, rash.  No swollen lymph nodes.  Hypothyroidism:  Patient has history of hypothyroid and is taking levothyroxine  25 mcg.  Her last TSH was while she was still pregnant about 8 months ago.  She wonders if she can get her thyroid  checked today so she can get more refills of her medicine. She denies symptoms suggestive of thyroid  disease.  Gestational diabetes:  She reports that she is continue to check her blood sugar because of her history of gestational diabetes. Her blood sugars are good .  She wonders if she can stop checking her blood sugar. She denies polyuria, polyphasia and polydipsia.  Dysuria:  Patient has had burning on urination for about 3 weeks.  She denies frequency or urgency.  No fever or chills.  No nausea or vomiting.  no suprapubic tenderness or CVA tenderness. No vaginal discharge.  Pertinent past medical history include: [id number].  (IUD-GCHD), hypothyroidism, history of gestational diabetes and hypertension    Patient Active Problem List   Diagnosis Date Noted   History of gestational hypertension 03/25/2024   History of gestational diabetes 02/26/2024   Gastro-esophageal reflux disease with esophagitis 08/19/2021   Hypothyroidism 03/10/2021   Depression 04/16/2012   Bulimia nervosa (HCC) 03/20/2012     Medications Ordered Prior to Encounter[1]  Allergies[2]  Social History   Socioeconomic History   Marital status: Married    Spouse name: Not on file   Number of children: Not on file    Years of education: Not on file   Highest education level: Not on file  Occupational History   Not on file  Tobacco Use   Smoking status: Never   Smokeless tobacco: Never  Vaping Use   Vaping status: Never Used  Substance and Sexual Activity   Alcohol use: Never   Drug use: Never   Sexual activity: Not Currently    Birth control/protection: None  Other Topics Concern   Not on file  Social History Narrative   ** Merged History Encounter **       ** Merged History Encounter **       Lives in [place] since 2005 with husband and 2 children (2011, 2008). Moved from [place] in 2005. Spanish speaking.    Social Drivers of Health   Tobacco Use: Low Risk (03/25/2024)   Patient History    Smoking Tobacco Use: Never    Smokeless Tobacco Use: Never    Passive Exposure: Not on file  Financial Resource Strain: Not on file  Food Insecurity: No Food Insecurity (02/26/2024)   Hunger Vital Sign    Worried About Running Out of Food in the Last Year: Never true    Ran Out of Food in the Last Year: Never true  Transportation Needs: No Transportation Needs (02/26/2024)   PRAPARE - Administrator, Civil Service (Medical): No    Lack of Transportation (Non-Medical): No  Physical Activity: Not on file  Stress: Not on file  Social Connections: Not  on file  Intimate Partner Violence: Not At Risk (02/26/2024)   Humiliation, Afraid, Rape, and Kick questionnaire    Fear of Current or Ex-Partner: No    Emotionally Abused: No    Physically Abused: No    Sexually Abused: No  Depression (PHQ2-9): Low Risk (12/12/2023)   Depression (PHQ2-9)    PHQ-2 Score: 1  Alcohol Screen: Not on file  Housing: Low Risk (02/26/2024)   Housing Stability Vital Sign    Unable to Pay for Housing in the Last Year: No    Number of Times Moved in the Last Year: 0    Homeless in the Last Year: No  Utilities: Not At Risk (02/26/2024)   AHC Utilities    Threatened with loss of utilities: No  Health  Literacy: Not on file    Family History  Problem Relation Age of Onset   Healthy Mother    Diabetes Father    Hearing loss Neg Hx    Cancer Neg Hx    Heart failure Neg Hx    Heart disease Neg Hx    Hypertension Neg Hx     Past Surgical History:  Procedure Laterality Date   COLONOSCOPY WITH PROPOFOL  N/A 03/18/2016   Procedure: COLONOSCOPY WITH PROPOFOL ;  Surgeon: Oliva Boots, MD;  Location: [facility];  Service: Endoscopy;  Laterality: N/A;   ESOPHAGOGASTRODUODENOSCOPY (EGD) WITH PROPOFOL  N/A 09/22/2017   Procedure: ESOPHAGOGASTRODUODENOSCOPY (EGD) WITH PROPOFOL ;  Surgeon: Boots Oliva, MD;  Location: [facility];  Service: Endoscopy;  Laterality: N/A;   NO PAST SURGERIES      ROS: Review of Systems Negative except as stated above  PHYSICAL EXAM: BP 108/74 (BP Location: Left Arm, Patient Position: Sitting, Cuff Size: Normal)   Pulse 78   Temp 98.1 F (36.7 C) (Oral)   Ht 5' 3 (1.6 m)   Wt 69 kg   SpO2 97%   BMI 26.96 kg/m   Physical Exam Vitals and nursing note reviewed.  HENT:     Mouth/Throat:     Mouth: Mucous membranes are moist.     Pharynx: Oropharynx is clear.     Comments: Small area of erythema left upper gumline under dental plate Eyes:     Conjunctiva/sclera: Conjunctivae normal.  Neck:     Comments: No lymphadenopathy Cardiovascular:     Rate and Rhythm: Normal rate and regular rhythm.  Pulmonary:     Effort: Pulmonary effort is normal.     Breath sounds: Normal breath sounds.  Abdominal:     General: There is no distension.  Skin:    General: Skin is warm and dry.  Neurological:     Mental Status: She is alert. Mental status is at baseline.          Latest Ref Rng & Units 02/26/2024    4:29 AM 02/13/2024    8:47 AM 10/05/2023   12:31 PM  CMP  Glucose 70 - 99 mg/dL 89  888  79   BUN 6 - 20 mg/dL 8  7  7    Creatinine 0.44 - 1.00 mg/dL 9.46  9.48  9.54   Sodium 135 - 145 mmol/L 134  134  137   Potassium 3.5 - 5.1 mmol/L 3.3  3.5  4.7    Chloride 98 - 111 mmol/L 107  108  102   CO2 22 - 32 mmol/L 17  17  21    Calcium 8.9 - 10.3 mg/dL 8.9  8.9  9.5   Total Protein 6.5 - 8.1 g/dL  6.1  6.1  6.7   Total Bilirubin 0.0 - 1.2 mg/dL 0.5  0.3  <9.7   Alkaline Phos 38 - 126 U/L 162  138  89   AST 15 - 41 U/L 26  25  27    ALT 0 - 44 U/L 25  23  30     Lipid Panel     Component Value Date/Time   CHOL 206 (H) 12/12/2022 1002   TRIG 106 12/12/2022 1002   HDL 61 12/12/2022 1002   LDLCALC 126 (H) 12/12/2022 1002    CBC    Component Value Date/Time   WBC 16.0 (H) 02/26/2024 1458   RBC 4.82 02/26/2024 1458   HGB 14.7 02/26/2024 1458   HGB 13.1 11/30/2023 0818   HCT 42.8 02/26/2024 1458   HCT 38.1 11/30/2023 0818   PLT 295 02/26/2024 1458   PLT 323 11/30/2023 0818   MCV 88.8 02/26/2024 1458   MCV 91 11/30/2023 0818   MCH 30.5 02/26/2024 1458   MCHC 34.3 02/26/2024 1458   RDW 14.3 02/26/2024 1458   RDW 13.1 11/30/2023 0818   LYMPHSABS 2.0 08/03/2023 1315   MONOABS 0.3 10/02/2017 1139   EOSABS 0.0 08/03/2023 1315   BASOSABS 0.0 08/03/2023 1315    Results for orders placed or performed in visit on 10/25/24  POCT glucose (manual entry)   Collection Time: 10/25/24  2:14 PM  Result Value Ref Range   POC Glucose 98 70 - 99 mg/dl  POCT glycosylated hemoglobin (Hb A1C)   Collection Time: 10/25/24  2:16 PM  Result Value Ref Range   Hemoglobin A1C 5.6 4.0 - 5.6 %   HbA1c POC (<> result, manual entry)     HbA1c, POC (prediabetic range)     HbA1c, POC (controlled diabetic range)    POCT URINALYSIS DIP (CLINITEK)   Collection Time: 10/25/24  2:24 PM  Result Value Ref Range   Color, UA yellow yellow   Clarity, UA clear clear   Glucose, UA negative negative mg/dL   Bilirubin, UA negative negative   Ketones, POC UA negative negative mg/dL   Spec Grav, UA 8.979 8.989 - 1.025   Blood, UA negative negative   pH, UA 7.0 5.0 - 8.0   POC PROTEIN,UA negative negative, trace   Urobilinogen, UA 0.2 0.2 or 1.0 E.U./dL   Nitrite,  UA Negative Negative   Leukocytes, UA Negative Negative     ASSESSMENT AND PLAN:  Assessment & Plan History of gestational diabetes As above, patient has history of gestational diabetes and has been checking her blood sugar.  She wonders if she can discontinue this procedure. Point-of-care testing revealed a glucose of 89 and an A1c of 5.6.  Okay to discontinue glucose checks    Dysuria As above, patient has had 3-week history of dysuria without frequency, urgency, pelvic pain, suprapubic tenderness, CVA tenderness, nausea, vomiting. She requested a urine dipstick. Urine is normal. She is encouraged to continue to drink plenty of fluids and practice good perineal care. Notify the clinic if her symptoms persist or worsen  Orders:   POCT URINALYSIS DIP (CLINITEK)  Dental implant pain, initial encounter As above, patient has had recurring facial and eye swelling without fever, chills, rash, sinus symptoms, ear pain, sore throat. On exam, she found a mildly erythematous area on the left upper gumline (underneath her dental plate).  My exam verified the same. Okay for round of antibiotic (category B as she is breast-feeding) Salt water gargles should soothe the area as well  Orders:   amoxicillin  (AMOXIL ) 500 MG capsule; Take 1 capsule (500 mg total) by mouth 3 (three) times daily for 10 days.  Acquired hypothyroidism As above, patient request TSH and refill of her thyroid  medication. She denies any symptom of concern related to thyroid  disorder. Okay for TSH. Will refill levothyroxine  based on lab results Orders:   TSH  Healthcare maintenance Per clinic protocol she is agreeable to a flu shot today. Okay to give while in the clinic Orders:   Flu vaccine trivalent PF, 6mos and older(Flulaval,Afluria,Fluarix,Fluzone)       Patient was given the opportunity to ask questions.  Patient verbalized understanding of the plan and was able to repeat key elements of the plan.   This  documentation was completed using Paediatric nurse.  Any transcriptional errors are unintentional.     Requested Prescriptions   Signed Prescriptions Disp Refills   amoxicillin  (AMOXIL ) 500 MG capsule 30 capsule 0    Sig: Take 1 capsule (500 mg total) by mouth 3 (three) times daily for 10 days.    Return in about 3 months (around 01/23/2025) for with PCP.  Davan Hark H, NP      [1]  Current Outpatient Medications on File Prior to Visit  Medication Sig Dispense Refill   levothyroxine  (SYNTHROID ) 25 MCG tablet Take 1 tablet (25 mcg total) by mouth daily. Please schedule PCP visit for additional refills. 30 tablet 0   Prenatal Vit-Fe Fumarate-FA (PRENATAL VITAMIN PO) Take by mouth.     NIFEdipine  (PROCARDIA -XL/NIFEDICAL-XL) 30 MG 24 hr tablet Take 1 tablet (30 mg total) by mouth daily. (Patient not taking: Reported on 04/03/2024) 30 tablet 1   [DISCONTINUED] fluticasone  (FLONASE ) 50 MCG/ACT nasal spray Place 2 sprays into both nostrils daily. (Patient not taking: Reported on 03/25/2020) 16 g 6   [DISCONTINUED] promethazine  (PHENERGAN ) 25 MG tablet Take 1 tablet (25 mg total) by mouth every 6 (six) hours as needed for nausea or vomiting. 30 tablet 0   No current facility-administered medications on file prior to visit.  [2] No Known Allergies  "

## 2024-10-25 NOTE — Telephone Encounter (Signed)
 FYI Only or Action Required?: FYI only for provider: appointment scheduled on 10/25/24.  Patient was last seen in primary care on 12/12/2023 by Delbert Clam, MD.  Called Nurse Triage reporting facial swelling and toothache.  Symptoms began a week ago.  Interventions attempted: Nothing.  Symptoms are: stable.  Triage Disposition: See HCP Within 4 Hours (Or PCP Triage)  Patient/caregiver understands and will follow disposition?: Yes- Appt scheduled for today.        Message from Victoria B sent at 10/25/2024 12:22 PM EST  Reason for Triage:  patient has tinglng and swelling in face   Reason for Disposition  Toothache  Answer Assessment - Initial Assessment Questions No swelling at present. Pt has has 2 intermittent episodes of bilateral cheek and eye swelling which was mild. No itching , rash, difficulty swallowing or breathing difficulty.       1. ONSET: When did the swelling start? (e.g., minutes, hours, days)     1 week ago comes and gone twice - not present now 2. LOCATION: What part of the face is swollen? (e.g., cheek, entire face, jaw joint area, under jaw)     Mouth eyes cheeks  both sides of face 3. SEVERITY: How swollen is it?     Mild  4. ITCHING: Is there any itching? If Yes, ask: How much?   (Scale 1-10; mild, moderate or severe)     no 5. PAIN: Is the swelling painful to touch? If Yes, ask: How painful is it?   (Scale 0-10; mild, moderate or severe)     No pain  6. FEVER: Do you have a fever? If Yes, ask: What is it, how was it measured, and when did it start?      no 7. CAUSE: What do you think is causing the face swelling?     Pt does not know  8. NEW MEDICINES: Have there been any new medicines started recently?     no 9. RECURRENT SYMPTOM: Have you had face swelling before? If Yes, ask: When was the last time? What happened that time?     no 10. OTHER SYMPTOMS: Do you have any other symptoms? (e.g., leg swelling,  toothache)       Toothache to left top front  Protocols used: Face Swelling-A-AH

## 2024-10-26 ENCOUNTER — Ambulatory Visit: Payer: Self-pay | Admitting: *Deleted

## 2024-10-26 DIAGNOSIS — E039 Hypothyroidism, unspecified: Secondary | ICD-10-CM

## 2024-10-26 LAB — TSH: TSH: 3.15 u[IU]/mL (ref 0.450–4.500)

## 2024-10-26 MED ORDER — LEVOTHYROXINE SODIUM 50 MCG PO TABS
50.0000 ug | ORAL_TABLET | Freq: Every day | ORAL | 0 refills | Status: AC
  Filled 2024-10-26: qty 30, 30d supply, fill #0

## 2024-10-27 ENCOUNTER — Other Ambulatory Visit: Payer: Self-pay

## 2024-11-05 ENCOUNTER — Other Ambulatory Visit: Payer: Self-pay

## 2025-01-23 ENCOUNTER — Ambulatory Visit: Payer: Self-pay | Admitting: Family Medicine
